# Patient Record
Sex: Male | Born: 1970 | Race: Asian | Hispanic: No | Marital: Married | Smoking: Current every day smoker
Health system: Southern US, Community
[De-identification: ages and names within clinical notes are randomized; demographics above are authoritative.]

## PROBLEM LIST (undated history)

## (undated) DIAGNOSIS — K861 Other chronic pancreatitis: Secondary | ICD-10-CM

## (undated) DIAGNOSIS — I10 Essential (primary) hypertension: Secondary | ICD-10-CM

## (undated) DIAGNOSIS — M549 Dorsalgia, unspecified: Secondary | ICD-10-CM

## (undated) DIAGNOSIS — G8929 Other chronic pain: Secondary | ICD-10-CM

## (undated) DIAGNOSIS — D649 Anemia, unspecified: Secondary | ICD-10-CM

## (undated) DIAGNOSIS — D696 Thrombocytopenia, unspecified: Secondary | ICD-10-CM

## (undated) DIAGNOSIS — K750 Abscess of liver: Secondary | ICD-10-CM

## (undated) DIAGNOSIS — I81 Portal vein thrombosis: Secondary | ICD-10-CM

## (undated) DIAGNOSIS — K760 Fatty (change of) liver, not elsewhere classified: Secondary | ICD-10-CM

## (undated) DIAGNOSIS — K8309 Other cholangitis: Secondary | ICD-10-CM

## (undated) DIAGNOSIS — R109 Unspecified abdominal pain: Secondary | ICD-10-CM

## (undated) DIAGNOSIS — J189 Pneumonia, unspecified organism: Secondary | ICD-10-CM

## (undated) HISTORY — PX: APPENDECTOMY: SHX54

---

## 2005-03-25 DIAGNOSIS — K861 Other chronic pancreatitis: Secondary | ICD-10-CM

## 2005-03-25 HISTORY — PX: OTHER SURGICAL HISTORY: SHX169

## 2005-03-25 HISTORY — DX: Other chronic pancreatitis: K86.1

## 2005-04-09 ENCOUNTER — Inpatient Hospital Stay (HOSPITAL_COMMUNITY): Admission: EM | Admit: 2005-04-09 | Discharge: 2005-04-11 | Payer: Self-pay | Admitting: Emergency Medicine

## 2005-04-09 ENCOUNTER — Ambulatory Visit: Payer: Self-pay | Admitting: Sports Medicine

## 2005-04-11 ENCOUNTER — Ambulatory Visit: Payer: Self-pay | Admitting: Gastroenterology

## 2005-04-22 ENCOUNTER — Ambulatory Visit (HOSPITAL_COMMUNITY): Admission: RE | Admit: 2005-04-22 | Discharge: 2005-04-22 | Payer: Self-pay | Admitting: Gastroenterology

## 2005-05-18 ENCOUNTER — Emergency Department (HOSPITAL_COMMUNITY): Admission: EM | Admit: 2005-05-18 | Discharge: 2005-05-18 | Payer: Self-pay | Admitting: Emergency Medicine

## 2005-07-26 ENCOUNTER — Encounter: Admission: RE | Admit: 2005-07-26 | Discharge: 2005-07-26 | Payer: Self-pay | Admitting: Occupational Medicine

## 2008-11-24 ENCOUNTER — Emergency Department (HOSPITAL_COMMUNITY): Admission: EM | Admit: 2008-11-24 | Discharge: 2008-11-24 | Payer: Self-pay | Admitting: Emergency Medicine

## 2008-12-05 ENCOUNTER — Emergency Department (HOSPITAL_COMMUNITY): Admission: EM | Admit: 2008-12-05 | Discharge: 2008-12-05 | Payer: Self-pay | Admitting: Emergency Medicine

## 2010-08-10 NOTE — Discharge Summary (Signed)
NAMEMARKEIS, Lawrence Brock NO.:  [id number]   MEDICAL RECORD NO.:  [id number]          PATIENT TYPE:  INP   LOCATION:  4703                         FACILITY:  [facility]   PHYSICIAN:  Alanson Puls, M.D.    DATE OF BIRTH:  05-23-1970   DATE OF ADMISSION:  04/08/2005  DATE OF DISCHARGE:                                 DISCHARGE SUMMARY   ATTENDING PHYSICIAN AT DISCHARGE:  Melina Fiddler, MD   ADMISSION DIAGNOSES:  Epigastric abdominal pain.   DISCHARGE DIAGNOSES:  Acute pancreatitis.   CONSULTATIONS:  Gastroenterology on April 10, 2004.   PROCEDURES:  1.  Abdominal ultrasound on April 09, 2005, that showed a small      gallbladder with a polyp and a dilated pancreatic duct of 7.5 mm.  No      biliary dilatation.  2.  CT of the abdomen on April 08, 2005, that revealed a dilated      pancreatic duct with a 6 mm stone projecting into the pancreatic duct      near the ampulla suggesting obstruction caused by gallstone. No evidence      of biliary ductal dilatation or cholelithiasis.  Findings may be      secondary to acute on chronic pancreatitis.  No pancreatic mass.  3.  On April 11, 2005, MRCP consistent with evidence of chronic      pancreatitis with dilatation of the main pancreatic duct and multiple      __________branches, calcification near the ampulla recently seen on CT      has past.  This is likely obstructing the pancreatic duct.  No evidence      of choledocholithiasis, biliary dilatation or gallstones.  Acute      pancreatitis.  Pancreatic fluid not seen.  Pancreatic duct stricture      cannot be excluded.   HOSPITAL COURSE:  Lawrence Brock is a 40 year old male with no significant  past medical history but did admit to alcohol use, drinking 4-5 beers a  couple of days prior to the onset of his epigastric abdominal pain.  The  patient states that the pain was a 10/10 and radiated to his back, not  relieved by aspirin or baking soda or  Alka-seltzer.  States that he had a  similar episode two months ago in which made him vomit.  This pain is not  related to food intake.  The pain became so severe that the patient was seen  at [facility] and then referred to [facility] for further  evaluation.  Past medical history is significant only for appendectomy.  Upon presentation to the emergency department, the patient's white blood  cell count was 8.6 with hemoglobin of 15 and platelets of 163, 71% segs.  Comprehensive metabolic panel was obtained that was within normal limits.  Total bilirubin of 1.1, alk-phos 39, AST 18, ALT 21.  The patient's amylase  was 923 and lipase of 988.  His lipid profile had a triglyceride of 75.  Serum LDH was 81.  Prior to discharge, the patient was tolerating a clear  diet  and was wanting to advance his diet as tolerated.  His pain was well  controlled.  It was not requiring any IV or p.o. pain medications.  Had no  episodes of emesis or loose stools.  His amylase and lipase at discharge  were 110 and 92, respectively.   CONDITION ON DISCHARGE:  Stable.   DISPOSITION:  The patient will be discharged to home.  Return to the  emergency department or [facility] for vomiting, fever, increased  abdominal pain, changes in stool.  The patient was instructed to tolerate a  clear diet and then advance as tolerated.  Was instructed to also avoid  alcohol use.   FOLLOWUP:  He is to follow up at [facility] for an ERCP on January  29 at 9 a.m. and was given a number [phone].  Prior to admission on January  29, the patient is to have nothing to eat or drink after midnight for this  test.  The patient wanted to follow up with Dr. Evonnie Pat at [facility], and was given the number [phone] to schedule for an appointment.  We will attempt to do this at this time.      Alanson Puls, M.D.     MR/MEDQ  D:  04/11/2005  T:  04/11/2005  Job:  [id number]

## 2010-08-10 NOTE — H&P (Signed)
[name], Brock               ACCOUNT NO.:  [id number]   MEDICAL RECORD NO.:  [id number]          PATIENT TYPE:  INP   LOCATION:  4703                         FACILITY:  [facility]   PHYSICIAN:  Leighton Roach McDiarmid, M.D.DATE OF BIRTH:  19-Aug-1970   DATE OF ADMISSION:  04/08/2005  DATE OF DISCHARGE:                                HISTORY & PHYSICAL   CHIEF COMPLAINT:  Epigastric abdominal pain.   HISTORY OF PRESENT ILLNESS:  [name] is a 40 year old Filipino male  with a three day history of epigastric pain, sharp, 10/10 radiating to legs  and right side of the abdomen, all the way to his back.  The patient has  been taking aspirin, baking soda, other sources without any relief.  Two  months ago he had similar episode and it was just relieved by  itself.  He  has been having self induced vomit, hoping he would get some relief.  The  pain has not changed with food intake with him forcing himself to eat.  Per  patient history he was watching the [employer] game on Saturday.  He drank 4 cans  of beer and on Sunday morning he woke up in the middle of the night with  sharp epigastric pain.  Since he could not get any relief with the pain, he  went to [facility] today and he was referred from there to the [facility] ED.   REVIEW OF SYSTEMS:  GI:  Last solid movement January 14, normal.  No nausea,  vomiting, GERD.  Prior to this episode there is no history of present  trauma.  No sick contact.  No history of hepatitis or gallbladder disease,  no dyspepsia.   PAST MEDICAL HISTORY:  Apparently good health  history, but patient has not  had a check up in a long time secondary to a lack of insurance.   FAMILY HISTORY:  Good health.   MEDICATIONS:  Multivitamins.   ALLERGIES:  None.   SOCIAL HISTORY:  The patient is living with wife, has 3 daughters living  with him.  The patient smokes 1 pack per day for several years.  Drinks once  a week, 4 cans of beer.  Denies any illicit  drugs.   PAST SURGICAL HISTORY:  Appendectomy at 40 years old.   LABORATORIES:  Amylase 923, lipase 988, CMP shows calcium 8.9, sodium 141,  potassium 3.8, chloride 109, CO2 27, BUN 13, creatinine 0.8.  Glucose 93,  AST 18, ALT 21.  Alkaline phosphatase 35.  Bilirubin total 1.1.  Total  protein 6.1.  Albumin 3.5.  CBC shows white blood cells 8.6, Hemoglobin  15.3, hematocrit 44.5, platelets 153.  Acute abdominal series shows chest  with no acute disease.  Chronic appearing lung change, not acute.  Abdominal  process non obstructive bowel gas pattern.   MEDICATIONS:  Medications received in the ED:  1.  Dilaudid 1 mg.  2.  Zofran 4 mg IV.  3.  Protonix 40 mg IV.  4.  IV fluids 500 cc normal saline bolus.   The patient was complaining of  this at [place].   VITAL SIGNS:  Temperature 98.3, heart rate 62, blood pressure 110/67,  respiratory rate 20, O2 98% on room air.   PHYSICAL EXAMINATION:  GENERAL:  No acute distress.  Alert and oriented x 3.  HEENT:  Oropharynx was within normal limits.  Pupils were equal, reactive to  light.  Intact extraocular movements. Pink tiny membranes erythematous but  sclerae anicteric.  NECK: Supple, no adenopathy. Thyroid not palpable.  CHEST:  Cardiovascular regular rate and rhythm, no murmurs, rubs or gallops.  Capillary refill is in the seconds. Pulses palpable symmetrically.  RESPIRATORY:  Good respiratory effort by the patient bilaterally.  No  wheezes, rhonchi or rales.  ABDOMEN:  Positive bowel sounds.  Nondistended.  Mild tenderness on the left  upper quadrant.  The patient is status post Dilaudid administration.  RECTAL:  Exam performed at [facility] normal with negative fecal  occult test.  GENITOURINARY:  Deferred.  EXTREMITIES:  No edema. No cyanosis.  Peripheral pulses palpable.  SKIN: Normal color, no lesions.  NEUROLOGICAL:  Cranial nerves II-XII normal.  Remarkable strength 5/5, deep  tendon reflexes 2+.  Normal gait.  Romberg  negative.  Finger to nose normal.   ASSESSMENT:  A 40 year old Filipino male with prior good health admitted  with acute pancreatitis.   PLAN:  1.  Abdominal pain.  Acute pancreatitis first episode with apparently      previously good health history.  Will admit to control pain, keep NPO.      Patient no acute abdomen with KUB normal.  __________ regards to alcohol      since patient drinks 4 cans of beer on the weekends, but no heavy      alcohol intake, but chronic consumption.  Will rule out gallbladder      disease or cholelithiasis with an abdominal ultrasound, but patient has      no history of dyspepsia and this is his first episode and may be similar      to a symptomatic gallbladder sign.  2.  Hypertriglyceridemia:  Will check a lipid profile in the morning.  I      think this is more of an episode of pancreatitis but since pain      persisted and had epigastric pain, it could have been also a bout of      gastritis or peptic ulcer disease.  Will get first CT scan of abdomen      with and out contrast now since patient is in mild to moderate pain and      this is a non complicated pancreatitis per clinical laboratory data.  If      patient does not respond to therapy and persistent pain may indicate      __________ CT scan.  We will control pain with Dilaudid 1-2 mg IV q.4      hours p.r.n.  3.  GI prophylaxis.  Protonix 40 mg IV b.i.d.  4.  __________.  Will keep NPO.  Give IV fluids normal saline plus 30 mEq of      KCL at 135 cc per hour.  5.  Social work to address financial situation.      Adrian Blackwater, MD    ______________________________  Leighton Roach McDiarmid, M.D.    IM/MEDQ  D:  04/09/2005  T:  04/09/2005  Job:  [id number]

## 2011-07-19 ENCOUNTER — Encounter (HOSPITAL_BASED_OUTPATIENT_CLINIC_OR_DEPARTMENT_OTHER): Payer: Self-pay | Admitting: Emergency Medicine

## 2011-07-19 ENCOUNTER — Emergency Department (HOSPITAL_BASED_OUTPATIENT_CLINIC_OR_DEPARTMENT_OTHER)
Admission: EM | Admit: 2011-07-19 | Discharge: 2011-07-19 | Disposition: A | Discharge status: Home / Self Care | Payer: Self-pay | Attending: Emergency Medicine | Admitting: Emergency Medicine

## 2011-07-19 ENCOUNTER — Emergency Department (INDEPENDENT_AMBULATORY_CARE_PROVIDER_SITE_OTHER): Payer: Self-pay

## 2011-07-19 DIAGNOSIS — R1033 Periumbilical pain: Secondary | ICD-10-CM | POA: Insufficient documentation

## 2011-07-19 DIAGNOSIS — K859 Acute pancreatitis without necrosis or infection, unspecified: Secondary | ICD-10-CM | POA: Insufficient documentation

## 2011-07-19 DIAGNOSIS — R1011 Right upper quadrant pain: Secondary | ICD-10-CM | POA: Insufficient documentation

## 2011-07-19 DIAGNOSIS — Z9089 Acquired absence of other organs: Secondary | ICD-10-CM

## 2011-07-19 DIAGNOSIS — K8689 Other specified diseases of pancreas: Secondary | ICD-10-CM

## 2011-07-19 DIAGNOSIS — R109 Unspecified abdominal pain: Secondary | ICD-10-CM

## 2011-07-19 DIAGNOSIS — F17200 Nicotine dependence, unspecified, uncomplicated: Secondary | ICD-10-CM | POA: Insufficient documentation

## 2011-07-19 LAB — DIFFERENTIAL
Eosinophils Absolute: 0.1 10*3/uL (ref 0.0–0.7)
Lymphs Abs: 1.6 10*3/uL (ref 0.7–4.0)
Neutro Abs: 8.6 10*3/uL — ABNORMAL HIGH (ref 1.7–7.7)

## 2011-07-19 LAB — COMPREHENSIVE METABOLIC PANEL
ALT: 17 U/L (ref 0–53)
BUN: 21 mg/dL (ref 6–23)
CO2: 26 mEq/L (ref 19–32)
Calcium: 9.6 mg/dL (ref 8.4–10.5)
Creatinine, Ser: 1 mg/dL (ref 0.50–1.35)
GFR calc Af Amer: >90 mL/min (ref >90)
Glucose, Bld: 106 mg/dL — ABNORMAL HIGH (ref 70–99)
Total Bilirubin: 0.5 mg/dL (ref 0.3–1.2)
Total Protein: 7.1 g/dL (ref 6.0–8.3)

## 2011-07-19 LAB — CBC
Hemoglobin: 14.9 g/dL (ref 13.0–17.0)
MCHC: 35.1 g/dL (ref 30.0–36.0)
Platelets: 158 10*3/uL (ref 150–400)
RBC: 4.75 MIL/uL (ref 4.22–5.81)
RDW: 11.8 % (ref 11.5–15.5)
WBC: 11.1 10*3/uL — ABNORMAL HIGH (ref 4.0–10.5)

## 2011-07-19 LAB — URINALYSIS, ROUTINE W REFLEX MICROSCOPIC
Glucose, UA: NEGATIVE mg/dL
Hgb urine dipstick: NEGATIVE
Ketones, ur: NEGATIVE mg/dL
Protein, ur: NEGATIVE mg/dL
Specific Gravity, Urine: 1.023 (ref 1.005–1.030)
pH: 6 (ref 5.0–8.0)

## 2011-07-19 MED ORDER — MORPHINE SULFATE 4 MG/ML IJ SOLN
4.0000 mg | Freq: Once | INTRAMUSCULAR | Status: AC
  Administered 2011-07-19: 4 mg via INTRAVENOUS
  Filled 2011-07-19: qty 1

## 2011-07-19 MED ORDER — OXYCODONE-ACETAMINOPHEN 5-325 MG PO TABS: 2.0000 | ORAL_TABLET | Freq: Four times a day (QID) | ORAL | Status: AC | PRN

## 2011-07-19 MED ORDER — SODIUM CHLORIDE 0.9 % IV SOLN
1000.0000 mL | Freq: Once | INTRAVENOUS | Status: AC
  Administered 2011-07-19: 1000 mL via INTRAVENOUS

## 2011-07-19 MED ORDER — SODIUM CHLORIDE 0.9 % IV SOLN: 1000.0000 mL | INTRAVENOUS | Status: DC

## 2011-07-19 MED ORDER — ONDANSETRON 8 MG PO TBDP: 8.0000 mg | ORAL_TABLET | Freq: Three times a day (TID) | ORAL | Status: AC | PRN

## 2011-07-19 NOTE — ED Notes (Signed)
Mid abdominal pain without n/v/d.  Pain started yesterday.

## 2011-07-19 NOTE — ED Provider Notes (Signed)
History     CSN: [id number]  Arrival date & time 07/19/11  1529   First MD Initiated Contact with Patient 07/19/11 1536      Chief Complaint  Patient presents with  . Abdominal Pain    (Consider location/radiation/quality/duration/timing/severity/associated sxs/prior treatment) HPI Patient is a 41 year old male who presents today complaining of abdominal pain that is a 9/10 and located in the right upper quadrant and periumbilical area. This began early this morning. This worsened throughout the day at work. Patient has not had nausea, vomiting, or fevers. His last bowel movement was 2 hours ago and was normal for him. The patient has history of an appendectomy but no other abdominal surgeries. Nothing has made the patient's pain better and movement makes it worse. He has not taken any medication for this. He does have 4 children and some of them have had gastrointestinal illness recently. Patient also has a history of pancreatitis and says this does feel somewhat similar. There are no other associated or modifying factors. Past Medical History  Diagnosis Date  . Pancreatitis     History reviewed. No pertinent past surgical history.  History reviewed. No pertinent family history.  History  Substance Use Topics  . Smoking status: Current Everyday Smoker -- 0.5 packs/day  . Smokeless tobacco: Not on file  . Alcohol Use: Yes      Review of Systems  Constitutional: Negative.   HENT: Negative.   Eyes: Negative.   Respiratory: Negative.   Cardiovascular: Negative.   Gastrointestinal: Positive for abdominal pain.  Genitourinary: Negative.   Musculoskeletal: Negative.   Skin: Negative.   Neurological: Negative.   Hematological: Negative.   Psychiatric/Behavioral: Negative.   All other systems reviewed and are negative.    Allergies  Review of patient's allergies indicates no known allergies.  Home Medications  No current outpatient prescriptions on file.  BP 141/105   Pulse 74  Temp(Src) 98 F (36.7 C) (Oral)  Resp 16  Ht 5\' 8"  (1.727 m)  Wt 160 lb (72.576 kg)  BMI 24.33 kg/m2  SpO2 100%  Physical Exam  Nursing note and vitals reviewed. GEN: Well-developed, well-nourished male in no distress HEENT: Atraumatic, normocephalic. Oropharynx clear without erythema EYES: PERRLA BL, no scleral icterus. NECK: Trachea midline, no meningismus CV: regular rate and rhythm. No murmurs, rubs, or gallops PULM: No respiratory distress.  No crackles, wheezes, or rales. GI: soft, tenderness to palpation most notable over the period umbilical and right upper quadrant areas. No guarding, rebound. + bowel sounds  GU: deferred Neuro: cranial nerves 2-12 intact, no abnormalities of strength or sensation, A and O x 3 MSK: Patient moves all 4 extremities symmetrically, no deformity, edema, or injury noted Skin: No rashes petechiae, purpura, or jaundice Psych: no abnormality of mood   ED Course  Procedures (including critical care time)  Labs Reviewed  CBC - Abnormal; Notable for the following:    WBC 11.1 (*)    All other components within normal limits  DIFFERENTIAL - Abnormal; Notable for the following:    Neutro Abs 8.6 (*)    All other components within normal limits  COMPREHENSIVE METABOLIC PANEL - Abnormal; Notable for the following:    Glucose, Bld 106 (*)    All other components within normal limits  LIPASE, BLOOD - Abnormal; Notable for the following:    Lipase 67 (*)    All other components within normal limits  URINALYSIS, ROUTINE W REFLEX MICROSCOPIC   US Abdomen Complete  07/19/2011  *RADIOLOGY  REPORT*  Clinical Data:  41 year old male with abdominal pain.  History of pancreatitis and prior appendectomy.  COMPLETE ABDOMINAL ULTRASOUND  Comparison:  CT abdomen 04/09/2005.  Findings:  Gallbladder:  Contracted.  No cholelithiasis or sludge evident. Wall thickness remains normal up to 2 mm.  No sonographic Murphy's sign elicited.  Common bile duct:   Normal measuring 3 mm diameter.  Liver:  Echogenicity within normal limits.  No focal liver lesion. No intrahepatic ductal dilatation.  IVC:  Incompletely visualized due to overlying bowel gas, visualized portions within normal limits.  Pancreas:  Bowel gas obscures some portions of the pancreas. Echogenicity of the pancreatic head and visualized body is within normal limits, however the main pancreatic duct is dilated up to 6 mm diameter (5-6 mm on the comparison 2007 CT).  No free fluid.  Spleen:  Normal measuring 6.5 cm in length.  Right Kidney:  Normal measuring 10.6 cm in length.  Left Kidney:  Large but simple appearing lower pole cyst measuring 4 cm diameter.  No hydronephrosis and otherwise normal measuring 1.8 cm.  Abdominal aorta:  Incompletely visualized due to overlying bowel gas, visualized portions within normal limits.  IMPRESSION: 1.  No definite acute abdominal process; chronic dilatation of the main pancreatic duct,correlation with amylase and lipase would be more sensitive than ultrasound for detection of pancreatitis. 2.  Contracted gallbladder.  No cholelithiasis or evidence of acute cholecystitis.  No CBD or intrahepatic biliary dilatation.  Original Report Authenticated By: Harley Hallmark, M.D.     1. Pancreatitis       MDM  Patient was evaluated for his abdominal pain and treated with morphine and IV fluids. Labs showed a slight elevation of lipase. Patient was feeling much better after pain medications. Right upper quadrant ultrasound was performed to confirm no signs of gallbladder disease. This was negative. Patient was discharged home with a prescription for Percocet as well as Zofran. He is unable to tolerate oral intake he is welcome to return. Patient was discharged in good condition and comfortable with plan for discharge.        Cyndra Numbers, MD 07/19/11 [phone]

## 2011-07-19 NOTE — Discharge Instructions (Signed)
Acute Pancreatitis The pancreas is a large gland located behind your stomach. It produces (secretes) enzymes. These enzymes help digest food. It also releases the hormones glucagon and insulin. These hormones help regulate blood sugar. When the pancreas becomes inflamed, the disease is called pancreatitis. Inflammation of the pancreas occurs when enzymes from the pancreas begin attacking and digesting the pancreas. CAUSES  Most cases ofsudden onset (acute) pancreatitis are caused by:  Alcohol abuse.   Gallstones.  Other less common causes are:  Some medications.   Exposure to certain chemicals   Infection.   Damage caused by an accident (trauma).   Surgery of the belly (abdomen).  SYMPTOMS  Acute pancreatitis usually begins with pain in the upper abdomen and may radiate to the back. This pain may last a couple days. The constant pain varies from mild to severe. The acute form of this disease may vary from mild, nonspecific abdominal pain to profound shock with coma. About 1 in 5 cases are severe. These patients become dehydrated and develop low blood pressure. In severe cases, bleeding into the pancreas can lead to shock and death. The lungs, heart, and kidneys may fail. DIAGNOSIS  Your caregiver will form a clinical opinion after giving you an exam. Laboratory work is used to confirm this diagnosis. Often,a digestive enzyme from the pancreas (serum amylase) and other enzymes are elevated. Sugars and fats (lipids) in the blood may be elevated. There may also be changes in the following levels: calcium, magnesium, potassium, chloride and bicarbonate (chemicals in the blood). X-rays, a CT scan, or ultrasound of your abdomen may be necessary to search for other causes of your abdominal pain. TREATMENT  Most pancreatitis requires treatment of symptoms. Most acute attacks last a couple of days. Your caregiver can discuss the treatment options with you.  If complications occur, hospitalization  may be necessary for pain control and intravenous (IV) fluid replacement.   Sometimes, a tube may be put into the stomach to control vomiting.   Food may not be allowed for 3 to 4 days. This gives the pancreas time to rest. Giving the pancreas a rest means there is no stimulation that would produce more enzymes and cause more damage.   Medicines (antibiotics) that kill germs may be given if infection is the cause.   Sometimes, surgery may be required.   Following an acute attack, your caregiver will determine the cause, if possible, and offer suggestions to prevent recurrences.  HOME CARE INSTRUCTIONS   Eat smaller, more frequent meals. This reduces the amount of digestive juices the pancreas produces.   Decrease the amount of fat in your diet. This may help reduce loose, diarrheal stools.   Drink enough water and fluids to keep your urine clear or pale yellow. This is to avoid dehydration which can cause increased pain.   Talk to your caregiver about pain relievers or other medicines that may help.   Avoid anything that may have triggered your pancreatitis (for example, alcohol).   Follow the diet advised by your caregiver. Do not advance the diet too soon.   Take medicines as prescribed.   Get plenty of rest.   Check your blood sugar at home as directed by your caregiver.   If your caregiver has given you a follow-up appointment, it is very important to keep that appointment. Not keeping the appointment could result in a lasting (chronic) or permanent injury, pain, and disability. If there is any problem keeping the appointment, you must call to reschedule.    SEEK MEDICAL CARE IF:   You are not recovering in the time described by your caregiver.   You have persistent pain, weakness, or feel sick to your stomach (nauseous).   You have recovered and then have another bout of pain.  SEEK IMMEDIATE MEDICAL CARE IF:   You are unable to eat or keep fluids down.   Your pain  increases a lot or changes.   You have an oral temperature above 102 F (38.9 C), not controlled by medicine.   Your skin or the white part of your eyes look yellow (jaundice).   You develop vomiting.   You feel dizzy or faint.   Your blood sugar is high (over 300).  MAKE SURE YOU:   Understand these instructions.   Will watch your condition.   Will get help right away if you are not doing well or get worse.  Document Released: 03/11/2005 Document Revised: 02/28/2011 Document Reviewed: 10/23/2007 [employer] Patient Information 2012 [employer]. 

## 2013-03-25 DIAGNOSIS — D696 Thrombocytopenia, unspecified: Secondary | ICD-10-CM

## 2013-03-25 HISTORY — DX: Thrombocytopenia, unspecified: D69.6

## 2013-04-07 ENCOUNTER — Telehealth: Payer: Self-pay | Admitting: Gastroenterology

## 2013-04-08 ENCOUNTER — Encounter: Payer: Self-pay | Admitting: Gastroenterology

## 2013-04-08 NOTE — Telephone Encounter (Signed)
Left message with family member to have pt return my call, pt has not been seen since 2007.

## 2013-05-05 ENCOUNTER — Ambulatory Visit: Payer: Self-pay | Admitting: Gastroenterology

## 2013-07-22 ENCOUNTER — Emergency Department (HOSPITAL_BASED_OUTPATIENT_CLINIC_OR_DEPARTMENT_OTHER)
Admission: EM | Admit: 2013-07-22 | Discharge: 2013-07-22 | Disposition: A | Discharge status: Home / Self Care | Payer: Managed Care, Other (non HMO) | Attending: Emergency Medicine | Admitting: Emergency Medicine

## 2013-07-22 ENCOUNTER — Encounter (HOSPITAL_BASED_OUTPATIENT_CLINIC_OR_DEPARTMENT_OTHER): Payer: Self-pay | Admitting: Emergency Medicine

## 2013-07-22 DIAGNOSIS — Z8719 Personal history of other diseases of the digestive system: Secondary | ICD-10-CM | POA: Insufficient documentation

## 2013-07-22 DIAGNOSIS — G8929 Other chronic pain: Secondary | ICD-10-CM | POA: Insufficient documentation

## 2013-07-22 DIAGNOSIS — F17200 Nicotine dependence, unspecified, uncomplicated: Secondary | ICD-10-CM | POA: Insufficient documentation

## 2013-07-22 DIAGNOSIS — M5380 Other specified dorsopathies, site unspecified: Secondary | ICD-10-CM | POA: Insufficient documentation

## 2013-07-22 DIAGNOSIS — M62830 Muscle spasm of back: Secondary | ICD-10-CM

## 2013-07-22 HISTORY — DX: Dorsalgia, unspecified: M54.9

## 2013-07-22 HISTORY — DX: Other chronic pain: G89.29

## 2013-07-22 LAB — URINALYSIS, ROUTINE W REFLEX MICROSCOPIC
Bilirubin Urine: NEGATIVE
Glucose, UA: NEGATIVE mg/dL
Hgb urine dipstick: NEGATIVE
KETONES UR: NEGATIVE mg/dL
Leukocytes, UA: NEGATIVE
Nitrite: NEGATIVE
PROTEIN: NEGATIVE mg/dL
SPECIFIC GRAVITY, URINE: 1.027 (ref 1.005–1.030)
UROBILINOGEN UA: 1 mg/dL (ref 0.0–1.0)
pH: 6 (ref 5.0–8.0)

## 2013-07-22 MED ORDER — OXYCODONE-ACETAMINOPHEN 5-325 MG PO TABS: 1.0000 | ORAL_TABLET | ORAL | Status: DC | PRN

## 2013-07-22 MED ORDER — DIAZEPAM 5 MG PO TABS
5.0000 mg | ORAL_TABLET | Freq: Once | ORAL | Status: AC
  Administered 2013-07-22: 5 mg via ORAL
  Filled 2013-07-22: qty 1

## 2013-07-22 MED ORDER — OXYCODONE-ACETAMINOPHEN 5-325 MG PO TABS
2.0000 | ORAL_TABLET | Freq: Once | ORAL | Status: AC
  Administered 2013-07-22: 2 via ORAL
  Filled 2013-07-22: qty 2

## 2013-07-22 MED ORDER — ONDANSETRON HCL 4 MG PO TABS: 4.0000 mg | ORAL_TABLET | Freq: Four times a day (QID) | ORAL | Status: DC

## 2013-07-22 MED ORDER — IBUPROFEN 800 MG PO TABS: 800.0000 mg | ORAL_TABLET | Freq: Three times a day (TID) | ORAL | Status: DC | PRN

## 2013-07-22 MED ORDER — IBUPROFEN 800 MG PO TABS
800.0000 mg | ORAL_TABLET | Freq: Once | ORAL | Status: AC
  Administered 2013-07-22: 800 mg via ORAL
  Filled 2013-07-22: qty 1

## 2013-07-22 MED ORDER — DIAZEPAM 5 MG PO TABS: 5.0000 mg | ORAL_TABLET | Freq: Three times a day (TID) | ORAL | Status: DC | PRN

## 2013-07-22 MED ORDER — ONDANSETRON 4 MG PO TBDP
4.0000 mg | ORAL_TABLET | Freq: Once | ORAL | Status: AC
  Administered 2013-07-22: 4 mg via ORAL
  Filled 2013-07-22: qty 1

## 2013-07-22 NOTE — ED Notes (Signed)
Pt to room 1 in w/c, reports back pain x Sunday when he helped a friend move. Pt states he reached over to turn on the shower "and my back locked up..." pt able to stand and pivot to bed with max assist, states he feels "like spasm" in his low back.

## 2013-07-22 NOTE — ED Notes (Signed)
Pt given po fluids per request. Resting quietly in nad.

## 2013-07-22 NOTE — Discharge Instructions (Signed)
Back Exercises Back exercises help treat and prevent back injuries. The goal of back exercises is to increase the strength of your abdominal and back muscles and the flexibility of your back. These exercises should be started when you no longer have back pain. Back exercises include:  Pelvic Tilt. Lie on your back with your knees bent. Tilt your pelvis until the lower part of your back is against the floor. Hold this position 5 to 10 sec and repeat 5 to 10 times.  Knee to Chest. Pull first 1 knee up against your chest and hold for 20 to 30 seconds, repeat this with the other knee, and then both knees. This may be done with the other leg straight or bent, whichever feels better.  Sit-Ups or Curl-Ups. Bend your knees 90 degrees. Start with tilting your pelvis, and do a partial, slow sit-up, lifting your trunk only 30 to 45 degrees off the floor. Take at least 2 to 3 seconds for each sit-up. Do not do sit-ups with your knees out straight. If partial sit-ups are difficult, simply do the above but with only tightening your abdominal muscles and holding it as directed.  Hip-Lift. Lie on your back with your knees flexed 90 degrees. Push down with your feet and shoulders as you raise your hips a couple inches off the floor; hold for 10 seconds, repeat 5 to 10 times.  Back arches. Lie on your stomach, propping yourself up on bent elbows. Slowly press on your hands, causing an arch in your low back. Repeat 3 to 5 times. Any initial stiffness and discomfort should lessen with repetition over time.  Shoulder-Lifts. Lie face down with arms beside your body. Keep hips and torso pressed to floor as you slowly lift your head and shoulders off the floor. Do not overdo your exercises, especially in the beginning. Exercises may cause you some mild back discomfort which lasts for a few minutes; however, if the pain is more severe, or lasts for more than 15 minutes, do not continue exercises until you see your caregiver.  Improvement with exercise therapy for back problems is slow.  See your caregivers for assistance with developing a proper back exercise program. Document Released: 04/18/2004 Document Revised: 06/03/2011 Document Reviewed: 01/10/2011 [facility] Patient Information 2014 [place]. Lumbosacral Strain Lumbosacral strain is a strain of any of the parts that make up your lumbosacral vertebrae. Your lumbosacral vertebrae are the bones that make up the lower third of your backbone. Your lumbosacral vertebrae are held together by muscles and tough, fibrous tissue (ligaments).  CAUSES  A sudden blow to your back can cause lumbosacral strain. Also, anything that causes an excessive stretch of the muscles in the low back can cause this strain. This is typically seen when people exert themselves strenuously, fall, lift heavy objects, bend, or crouch repeatedly. RISK FACTORS  Physically demanding work.  Participation in pushing or pulling sports or sports that require sudden twist of the back (tennis, golf, baseball).  Weight lifting.  Excessive lower back curvature.  Forward-tilted pelvis.  Weak back or abdominal muscles or both.  Tight hamstrings. SIGNS AND SYMPTOMS  Lumbosacral strain may cause pain in the area of your injury or pain that moves (radiates) down your leg.  DIAGNOSIS Your health care provider can often diagnose lumbosacral strain through a physical exam. In some cases, you may need tests such as X-ray exams.  TREATMENT  Treatment for your lower back injury depends on many factors that your clinician will have to  evaluate. However, most treatment will include the use of anti-inflammatory medicines. HOME CARE INSTRUCTIONS   Avoid hard physical activities (tennis, racquetball, waterskiing) if you are not in proper physical condition for it. This may aggravate or create problems.  If you have a back problem, avoid sports requiring sudden body movements. Swimming and walking are  generally safer activities.  Maintain good posture.  Maintain a healthy weight.  For acute conditions, you may put ice on the injured area.  Put ice in a plastic bag.  Place a towel between your skin and the bag.  Leave the ice on for 20 minutes, 2 3 times a day.  When the low back starts healing, stretching and strengthening exercises may be recommended. SEEK MEDICAL CARE IF:  Your back pain is getting worse.  You experience severe back pain not relieved with medicines. SEEK IMMEDIATE MEDICAL CARE IF:   You have numbness, tingling, weakness, or problems with the use of your arms or legs.  There is a change in bowel or bladder control.  You have increasing pain in any area of the body, including your belly (abdomen).  You notice shortness of breath, dizziness, or feel faint.  You feel sick to your stomach (nauseous), are throwing up (vomiting), or become sweaty.  You notice discoloration of your toes or legs, or your feet get very cold. MAKE SURE YOU:   Understand these instructions.  Will watch your condition.  Will get help right away if you are not doing well or get worse. Document Released: 12/19/2004 Document Revised: 12/30/2012 Document Reviewed: 10/28/2012 [facility] Patient Information 2014 [place], [place].

## 2013-07-22 NOTE — ED Notes (Signed)
Patient ambulates without difficulty.    MD at bedside.  Patient preparing for discharge.

## 2013-07-22 NOTE — ED Provider Notes (Addendum)
TIME SEEN: 7:27 AM  CHIEF COMPLAINT: Back spasm  HPI: Patient is a 43 year old male with a history of prior episodes of pancreatitis and muscle spasms in his lower back presents emergency department with back pain, spasms. He reports that he works 2 jobs and frequently lifts very heavy items. He states that several days ago he helped his friend move. Today when he was getting in the shower he felt his back "locked up". He states he felt some tingling in his bilateral toes after this happened but this numbness is completely resolved. He denies any focal weakness. No bowel or bladder incontinence. No urinary retention. No fever.  No other history of injury. He states he's had similar pain in the past and went to [facility] who gave him a shot of pain medication and sent him home with gabapentin and ibuprofen which have helped intermittently with his pain. Patient also reports he has intermittent episodes of pain with urination that he describes as a burning. No penile discharge, scrotal swelling or testicular pain. No hematuria. Patient is married, wife is at bedside.  ROS: See HPI Constitutional: no fever  Eyes: no drainage  ENT: no runny nose   Cardiovascular:  no chest pain  Resp: no SOB  GI: no vomiting GU: no dysuria Integumentary: no rash  Allergy: no hives  Musculoskeletal: no leg swelling  Neurological: no slurred speech ROS otherwise negative  PAST MEDICAL HISTORY/PAST SURGICAL HISTORY:  Past Medical History  Diagnosis Date  . Pancreatitis   . Chronic back pain     MEDICATIONS:  Prior to Admission medications   Medication Sig Start Date End Date Taking? Authorizing Provider  gabapentin (NEURONTIN) 300 MG capsule Take 600 mg by mouth 2 (two) times daily as needed.   Yes Historical Provider, MD  Alum & Mag Hydroxide-Simeth (ANTACID ANTI-GAS PO) Take 1 tablet by mouth daily as needed. Patient used this medication for heartburn.    Historical Provider, MD  Homeopathic Products  (ALLERGY MEDICINE PO) Take 1 tablet by mouth daily as needed. Patient used this medication for allergies.    Historical Provider, MD  ibuprofen (ADVIL,MOTRIN) 200 MG tablet Take 400 mg by mouth every 6 (six) hours as needed. Patient used this medication for his muscle aches.    Historical Provider, MD  Multiple Vitamin (MULTIVITAMIN) tablet Take 1 tablet by mouth daily.    Historical Provider, MD    ALLERGIES:  No Known Allergies  SOCIAL HISTORY:  History  Substance Use Topics  . Smoking status: Current Every Day Smoker -- 0.50 packs/day  . Smokeless tobacco: Not on file  . Alcohol Use: Yes    FAMILY HISTORY: History reviewed. No pertinent family history.  EXAM: BP 134/91  Pulse 58  Temp(Src) 98 F (36.7 C) (Oral)  Resp 18  SpO2 99% CONSTITUTIONAL: Alert and oriented and responds appropriately to questions. Well-appearing; well-nourished HEAD: Normocephalic EYES: Conjunctivae clear, PERRL ENT: normal nose; no rhinorrhea; moist mucous membranes; pharynx without lesions noted NECK: Supple, no meningismus, no LAD  CARD: RRR; S1 and S2 appreciated; no murmurs, no clicks, no rubs, no gallops RESP: Normal chest excursion without splinting or tachypnea; breath sounds clear and equal bilaterally; no wheezes, no rhonchi, no rales,  ABD/GI: Normal bowel sounds; non-distended; soft, non-tender, no rebound, no guarding BACK:  The back appears normal and is diffusely tender to palpation in the lumbar paraspinal musculature with associated spasm, no midline tenderness or step-off or deformity, there is no CVA tenderness EXT: Normal ROM in all  joints; non-tender to palpation; no edema; normal capillary refill; no cyanosis    SKIN: Normal color for age and race; warm NEURO: Moves all extremities equally, strength 5/5 in all 4 extremity, sensation to light touch intact diffusely, 2+ bilateral upper and lower extremity deep tendon reflexes, no clonus PSYCH: The patient's mood and manner are  appropriate. Grooming and personal hygiene are appropriate.  MEDICAL DECISION MAKING: Patient here for muscle spasms. I am not concerned for epidural abscess, discitis, transverse myelitis, spinal stenosis or cauda equina. Do not feel he needs imaging at this time. We'll give ibuprofen, Percocet, Valium, Zofran and reassess. Given his intermittent episodes of dysuria, we'll also obtain a urinalysis although I suspect his back pain is do to musculoskeletal pain. He is not concerned for STD exposure.  ED PROGRESS: 8:18 AM  Pt reports feeling much better. He is able to ambulate without difficulty or assistance. Normal gait. His urinalysis shows no sign of infection or blood. We'll discharge home with prescription for ibuprofen, Percocet and Valium, work note, supportive care instructions and return precautions. Patient and family at bedside verbalize understanding and are comfortable with this plan     [place], DO 07/22/13 [place], DO 07/22/13 [id number]

## 2013-09-03 ENCOUNTER — Encounter (HOSPITAL_COMMUNITY): Payer: Self-pay | Admitting: Emergency Medicine

## 2013-09-03 ENCOUNTER — Emergency Department (HOSPITAL_COMMUNITY): Payer: Managed Care, Other (non HMO)

## 2013-09-03 ENCOUNTER — Emergency Department (HOSPITAL_COMMUNITY)
Admission: EM | Admit: 2013-09-03 | Discharge: 2013-09-04 | Disposition: A | Discharge status: Home / Self Care | Payer: Managed Care, Other (non HMO) | Attending: Emergency Medicine | Admitting: Emergency Medicine

## 2013-09-03 DIAGNOSIS — G8929 Other chronic pain: Secondary | ICD-10-CM | POA: Insufficient documentation

## 2013-09-03 DIAGNOSIS — M545 Low back pain, unspecified: Secondary | ICD-10-CM | POA: Insufficient documentation

## 2013-09-03 DIAGNOSIS — K859 Acute pancreatitis without necrosis or infection, unspecified: Secondary | ICD-10-CM | POA: Insufficient documentation

## 2013-09-03 DIAGNOSIS — Z79899 Other long term (current) drug therapy: Secondary | ICD-10-CM | POA: Insufficient documentation

## 2013-09-03 DIAGNOSIS — F17200 Nicotine dependence, unspecified, uncomplicated: Secondary | ICD-10-CM | POA: Insufficient documentation

## 2013-09-03 LAB — CBC WITH DIFFERENTIAL/PLATELET
Basophils Absolute: 0 10*3/uL (ref 0.0–0.1)
Basophils Relative: 0 % (ref 0–1)
EOS ABS: 0.1 10*3/uL (ref 0.0–0.7)
EOS PCT: 1 % (ref 0–5)
HCT: 43.3 % (ref 39.0–52.0)
Hemoglobin: 14.6 g/dL (ref 13.0–17.0)
Lymphocytes Relative: 12 % (ref 12–46)
Lymphs Abs: 0.9 10*3/uL (ref 0.7–4.0)
MCH: 30.6 pg (ref 26.0–34.0)
MCHC: 33.7 g/dL (ref 30.0–36.0)
MCV: 90.8 fL (ref 78.0–100.0)
Monocytes Absolute: 0.6 10*3/uL (ref 0.1–1.0)
Monocytes Relative: 8 % (ref 3–12)
Neutro Abs: 6.1 10*3/uL (ref 1.7–7.7)
Neutrophils Relative %: 79 % — ABNORMAL HIGH (ref 43–77)
PLATELETS: 169 10*3/uL (ref 150–400)
RBC: 4.77 MIL/uL (ref 4.22–5.81)
RDW: 11.9 % (ref 11.5–15.5)
WBC: 7.8 10*3/uL (ref 4.0–10.5)

## 2013-09-03 LAB — COMPREHENSIVE METABOLIC PANEL
ALT: 21 U/L (ref 0–53)
AST: 17 U/L (ref 0–37)
Albumin: 3.8 g/dL (ref 3.5–5.2)
Alkaline Phosphatase: 76 U/L (ref 39–117)
BUN: 23 mg/dL (ref 6–23)
CALCIUM: 9.7 mg/dL (ref 8.4–10.5)
CO2: 24 mEq/L (ref 19–32)
Chloride: 100 mEq/L (ref 96–112)
Creatinine, Ser: 0.97 mg/dL (ref 0.50–1.35)
GFR calc Af Amer: >90 mL/min (ref >90)
GFR calc non Af Amer: >90 mL/min (ref >90)
Glucose, Bld: 116 mg/dL — ABNORMAL HIGH (ref 70–99)
Potassium: 4 mEq/L (ref 3.7–5.3)
SODIUM: 138 meq/L (ref 137–147)
TOTAL PROTEIN: 7.9 g/dL (ref 6.0–8.3)
Total Bilirubin: 0.3 mg/dL (ref 0.3–1.2)

## 2013-09-03 LAB — URINALYSIS, ROUTINE W REFLEX MICROSCOPIC
Bilirubin Urine: NEGATIVE
Glucose, UA: NEGATIVE mg/dL
Hgb urine dipstick: NEGATIVE
Ketones, ur: NEGATIVE mg/dL
Leukocytes, UA: NEGATIVE
NITRITE: NEGATIVE
Protein, ur: NEGATIVE mg/dL
SPECIFIC GRAVITY, URINE: 1.028 (ref 1.005–1.030)
Urobilinogen, UA: 1 mg/dL (ref 0.0–1.0)
pH: 5 (ref 5.0–8.0)

## 2013-09-03 LAB — LIPASE, BLOOD: LIPASE: 92 U/L — AB (ref 11–59)

## 2013-09-03 MED ORDER — MORPHINE SULFATE 4 MG/ML IJ SOLN
4.0000 mg | Freq: Once | INTRAMUSCULAR | Status: AC
  Administered 2013-09-03: 4 mg via INTRAVENOUS
  Filled 2013-09-03: qty 1

## 2013-09-03 NOTE — ED Notes (Signed)
Pt reports having intermittent centralized lower abdominal pain that began last week, however returned this morning and has worsened throughout the day. Pt also reports lower back, nausea, emesis, and constipation. Pt states that he has had a difficult time having a bowel movement since being seen at [facility] on 4/30, which he states he has used stool softeners, which he has had two bowel movements today. Pt is A/O x4 and ambulatory to triage without difficulty.

## 2013-09-03 NOTE — ED Provider Notes (Signed)
CSN: [id number]     Arrival date & time 09/03/13  2136 History   First MD Initiated Contact with Patient 09/03/13 2202     Chief Complaint  Patient presents with  . Abdominal Pain  . Back Pain     (Consider location/radiation/quality/duration/timing/severity/associated sxs/prior Treatment) The history is provided by the patient and medical records. No language interpreter was used.    Lawrence Brock is a 43 y.o. male  with a hx of pancreatitis, chronic low back pain presents to the Emergency Department complaining of gradual, persistent, progressively worsening centralized, lower abd pain onset 1 week ago; acutely worsening this AM. Associated symptoms include low back pain which is chronic and unchanged, nausea, vomiting (x1 3 days ago; NBNB), constipation.  Pt reports using stool softeners which produced BM x2 today, but without relief of the pain.  He reports he is taking narcotic pain medications for his back.   Patient reports feeling less bloated after his bowel movement then proceeded to eat [place] food, [place] french fries, large milkshake and margarita.   Abdominal pain significantly worsened after this however patient did not vomit. No aggravating or alleviating factors. Pt denies fever, chills, headache neck pain, chest pain, shortness of breath, diarrhea, weakness, dizziness, syncope..     Past Medical History  Diagnosis Date  . Pancreatitis   . Chronic back pain    History reviewed. No pertinent past surgical history. No family history on file. History  Substance Use Topics  . Smoking status: Current Every Day Smoker -- 0.50 packs/day  . Smokeless tobacco: Not on file  . Alcohol Use: Yes    Review of Systems  Constitutional: Negative for fever, diaphoresis, appetite change, fatigue and unexpected weight change.  HENT: Negative for mouth sores and trouble swallowing.   Respiratory: Negative for cough, chest tightness, shortness of breath, wheezing and stridor.    Cardiovascular: Negative for chest pain and palpitations.  Gastrointestinal: Positive for nausea, vomiting and abdominal pain. Negative for diarrhea, constipation, blood in stool, abdominal distention and rectal pain.  Genitourinary: Negative for dysuria, urgency, frequency, hematuria, flank pain and difficulty urinating.  Musculoskeletal: Positive for back pain (chronic). Negative for neck pain and neck stiffness.  Skin: Negative for rash.  Neurological: Negative for weakness.  Hematological: Negative for adenopathy.  Psychiatric/Behavioral: Negative for confusion.  All other systems reviewed and are negative.     Allergies  Review of patient's allergies indicates no known allergies.  Home Medications   Prior to Admission medications   Medication Sig Start Date End Date Taking? Authorizing Provider  diazepam (VALIUM) 5 MG tablet Take 1 tablet (5 mg total) by mouth every 8 (eight) hours as needed for muscle spasms. 07/22/13  Yes Kristen N Ward, DO  ibuprofen (ADVIL,MOTRIN) 200 MG tablet Take 400 mg by mouth every 6 (six) hours as needed. Patient used this medication for his muscle aches.   Yes Historical Provider, MD  ibuprofen (ADVIL,MOTRIN) 800 MG tablet Take 1 tablet (800 mg total) by mouth every 8 (eight) hours as needed for mild pain. 07/22/13  Yes [place], DO  HYDROcodone-ibuprofen (VICOPROFEN) 7.5-200 MG per tablet Take 1 tablet by mouth every 8 (eight) hours as needed for moderate pain. 09/04/13   Aiysha Jillson, PA-C  polyethylene glycol (MIRALAX / GLYCOLAX) packet Take 17 g by mouth daily. 09/04/13   Loys Hoselton, PA-C  promethazine (PHENERGAN) 25 MG tablet Take 1 tablet (25 mg total) by mouth every 6 (six) hours as needed for nausea or vomiting. 09/04/13  Manley Fason, PA-C   BP 133/97  Pulse 71  Temp(Src) 98.2 F (36.8 C) (Oral)  Resp 18  SpO2 100% Physical Exam  Nursing note and vitals reviewed. Constitutional: He is oriented to person, place,  and time. He appears well-developed and well-nourished. No distress.  HENT:  Head: Normocephalic and atraumatic.  Mouth/Throat: Oropharynx is clear and moist. No oropharyngeal exudate.  Eyes: Conjunctivae are normal. No scleral icterus.  Neck: Normal range of motion. Neck supple.  Full ROM without pain  Cardiovascular: Normal rate, regular rhythm, normal heart sounds and intact distal pulses.   No murmur heard. Pulmonary/Chest: Effort normal and breath sounds normal. No respiratory distress. He has no wheezes.  Abdominal: Soft. Bowel sounds are normal. He exhibits no distension and no mass. There is tenderness in the right upper quadrant. There is guarding. There is no rebound and no CVA tenderness.  Abdominal exam with tenderness in the epigastrium and right upper quadrant with voluntary guarding And no rebound, rigidity or peritoneal signs No CVA tenderness  Musculoskeletal: Normal range of motion.  Full range of motion of the T-spine and L-spine No tenderness to palpation of the spinous processes of the T-spine or L-spine Mild tenderness to palpation of the paraspinous muscles of the L-spine  Lymphadenopathy:    He has no cervical adenopathy.  Neurological: He is alert and oriented to person, place, and time. He has normal reflexes. Coordination normal.  Speech is clear and goal oriented, follows commands Normal 5/5 strength in upper and lower extremities bilaterally including dorsiflexion and plantar flexion, strong and equal grip strength Sensation normal to light and sharp touch Moves extremities without ataxia, coordination intact Normal gait Normal balance   Skin: Skin is warm and dry. No rash noted. He is not diaphoretic. No erythema.  Psychiatric: He has a normal mood and affect. His behavior is normal.    ED Course  Procedures (including critical care time) Labs Review Labs Reviewed  CBC WITH DIFFERENTIAL - Abnormal; Notable for the following:    Neutrophils Relative %  79 (*)    All other components within normal limits  COMPREHENSIVE METABOLIC PANEL - Abnormal; Notable for the following:    Glucose, Bld 116 (*)    All other components within normal limits  LIPASE, BLOOD - Abnormal; Notable for the following:    Lipase 92 (*)    All other components within normal limits  URINALYSIS, ROUTINE W REFLEX MICROSCOPIC    Imaging Review US Abdomen Complete  09/04/2013   CLINICAL DATA:  Epigastric abdominal pain and right upper quadrant abdominal tenderness.  EXAM: ULTRASOUND ABDOMEN COMPLETE  COMPARISON:  None.  FINDINGS: Gallbladder:  No gallstones or wall thickening visualized. No sonographic Murphy sign noted.  Common bile duct:  Diameter: 0.3 cm, within normal limits in caliber.  Liver:  No focal lesion identified. Within normal limits in parenchymal echogenicity.  IVC:  No abnormality visualized.  Pancreas:  Not visualized due to overlying structures.  Spleen:  Size and appearance within normal limits.  Right Kidney:  Length: 9.2 cm. Echogenicity within normal limits. No mass or hydronephrosis visualized.  Left Kidney:  Length: 10.4 cm. Echogenicity within normal limits. No hydronephrosis visualized. A 3.5 x 3.2 x 2.9 cm simple cyst is noted at the lower pole of the left kidney.  Abdominal aorta:  Not visualized on this study due to overlying bowel gas.  Other findings:  None.  IMPRESSION: 1. No acute abnormality seen in the abdomen. Evaluation is mildly suboptimal due to  overlying bowel gas. 2. Left renal cyst noted.   Electronically Signed   By: Garald Balding M.D.   On: 09/04/2013 01:16   Dg Abd Acute W/chest  09/03/2013   CLINICAL DATA:  Abdominal pain and back pain.  Nausea and vomiting.  EXAM: ACUTE ABDOMEN SERIES (ABDOMEN 2 VIEW & CHEST 1 VIEW)  COMPARISON:  Chest abdominal radiograph performed 05/18/2005, and 07/26/2005  FINDINGS: The lungs are well-aerated and clear. There is no evidence of focal opacification, pleural effusion or pneumothorax. The  cardiomediastinal silhouette is within normal limits.  The visualized bowel gas pattern is unremarkable. Scattered stool and air are seen within the colon; there is no evidence of small bowel dilatation to suggest obstruction. No free intra-abdominal air is identified on the provided upright view.  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  No acute osseous abnormalities are seen; the sacroiliac joints are unremarkable in appearance.  IMPRESSION: 1. Unremarkable bowel gas pattern; no free intra-abdominal air seen. 2. No acute cardiopulmonary process identified.   Electronically Signed   By: Garald Balding M.D.   On: 09/03/2013 23:41     EKG Interpretation None      No red flag s/s of low back pain. Patient was counseled on back pain precautions and told to do activity as tolerated but do not lift, push, or pull heavy objects more than 10 pounds for the next week. Patient counseled to use ice or heat on back for no longer than 15 minutes every hour.   Patient prescribed narcotic pain medicine and counseled on proper use of narcotic pain medications. Told he can increase to every 4 hrs if needed while pain is worse. Counseled not to combine this medication with others containing tylenol.   Urged patient not to drink alcohol, drive, or perform any other activities that requires focus while taking either of these medications.   Patient urged to follow-up with PCP if pain does not improve with treatment and rest or if pain becomes recurrent. Urged to return with worsening severe pain, loss of bowel or bladder control, trouble walking.   The patient verbalizes understanding and agrees with the plan.   MDM   Final diagnoses:  Pancreatitis  Chronic low back pain   Aayush Wernick presents with chronic back pain and epigastric pain.  Patient with history of alcohol abuse and pancreatitis secondary to this. Patient with epigastric and right upper quadrant abdominal pain  today. Will obtain ultrasound. He's also complaining of constipation due to his narcotic usage. Her pain is at baseline  With no red flags.  1:57 AM Patient labs reassuring without leukocytosis. Lipase elevated to 92, symptoms secondary to acute pancreatitis. Urinalysis without evidence of urinary tract infection or dehydration. Ultrasound without evidence of gallstones, cholecystitis or choledocholithiasis. Acute abdomen without evidence of significant constipation.  Patient reports he feels much better at this time. Tolerating by mouth without difficulty and ready to be discharged home. Patient spoke with his primary care or GI specialist for further evaluation  Patient with back pain.  No neurological deficits and normal neuro exam.  Patient can walk without difficulty or significant pain.  No loss of bowel or bladder control.  No concern for cauda equina.  No fever, night sweats, weight loss, h/o cancer, IVDU.  RICE protocol and pain medicine indicated and discussed with patient.   I have personally reviewed patient's vitals, nursing note and any pertinent labs or imaging.  I performed an undressed physical exam.  At this time, it has been determined that no acute conditions requiring further emergency intervention. The patient/guardian have been advised of the diagnosis and plan. I reviewed all labs and imaging including any potential incidental findings. We have discussed signs and symptoms that warrant return to the ED, such as high fevers, intractable vomiting, hematemesis.  Patient/guardian has voiced understanding and agreed to follow-up with the PCP or specialist in 3 days.  Vital signs are stable at discharge.   BP 133/97  Pulse 71  Temp(Src) 98.2 F (36.8 C) (Oral)  Resp 18  SpO2 100%        Abigail Butts, PA-C 09/04/13 0159

## 2013-09-04 MED ORDER — HYDROCODONE-IBUPROFEN 7.5-200 MG PO TABS: 1.0000 | ORAL_TABLET | Freq: Three times a day (TID) | ORAL | Status: DC | PRN

## 2013-09-04 MED ORDER — POLYETHYLENE GLYCOL 3350 17 G PO PACK: 17.0000 g | PACK | Freq: Every day | ORAL | Status: DC

## 2013-09-04 MED ORDER — PROMETHAZINE HCL 25 MG PO TABS: 25.0000 mg | ORAL_TABLET | Freq: Four times a day (QID) | ORAL | Status: DC | PRN

## 2013-09-04 NOTE — Discharge Instructions (Signed)
1. Medications: vicoprofen, and, MiraLax, usual home medications 2. Treatment: rest, drink plenty of fluids,  3. Follow Up: Please followup with your primary doctor gastroenterologist in 3 days for discussion of your diagnoses and further evaluation after today's visit; if you do not have a primary care doctor use the resource guide provided to find one;    Acute Pancreatitis Acute pancreatitis is a disease in which the pancreas becomes suddenly inflamed. The pancreas is a large gland located behind your stomach. The pancreas produces enzymes that help digest food. The pancreas also releases the hormones glucagon and insulin that help regulate blood sugar. Damage to the pancreas occurs when the digestive enzymes from the pancreas are activated and begin attacking the pancreas before being released into the intestine. Most acute attacks last a couple of days and can cause serious complications. Some people become dehydrated and develop low blood pressure. In severe cases, bleeding into the pancreas can lead to shock and can be life-threatening. The lungs, heart, and kidneys may fail. CAUSES  Pancreatitis can happen to anyone. In some cases, the cause is unknown. Most cases are caused by:  Alcohol abuse.  Gallstones. Other less common causes are:  Certain medicines.  Exposure to certain chemicals.  Infection.  Damage caused by an accident (trauma).  Abdominal surgery. SYMPTOMS   Pain in the upper abdomen that may radiate to the back.  Tenderness and swelling of the abdomen.  Nausea and vomiting. DIAGNOSIS  Your caregiver will perform a physical exam. Blood and stool tests may be done to confirm the diagnosis. Imaging tests may also be done, such as X-rays, CT scans, or an ultrasound of the abdomen. TREATMENT  Treatment usually requires a stay in the hospital. Treatment may include:  Pain medicine.  Fluid replacement through an intravenous line (IV).  Placing a tube in the  stomach to remove stomach contents and control vomiting.  Not eating for 3 or 4 days. This gives your pancreas a rest, because enzymes are not being produced that can cause further damage.  Antibiotic medicines if your condition is caused by an infection.  Surgery of the pancreas or gallbladder. HOME CARE INSTRUCTIONS   Follow the diet advised by your caregiver. This may involve avoiding alcohol and decreasing the amount of fat in your diet.  Eat smaller, more frequent meals. This reduces the amount of digestive juices the pancreas produces.  Drink enough fluids to keep your urine clear or pale yellow.  Only take over-the-counter or prescription medicines as directed by your caregiver.  Avoid drinking alcohol if it caused your condition.  Do not smoke.  Get plenty of rest.  Check your blood sugar at home as directed by your caregiver.  Keep all follow-up appointments as directed by your caregiver. SEEK MEDICAL CARE IF:   You do not recover as quickly as expected.  You develop new or worsening symptoms.  You have persistent pain, weakness, or nausea.  You recover and then have another episode of pain. SEEK IMMEDIATE MEDICAL CARE IF:   You are unable to eat or keep fluids down.  Your pain becomes severe.  You have a fever or persistent symptoms for more than 2 to 3 days.  You have a fever and your symptoms suddenly get worse.  Your skin or the white part of your eyes turn yellow (jaundice).  You develop vomiting.  You feel dizzy, or you faint.  Your blood sugar is high (over 300 mg/dL). MAKE SURE YOU:   Understand these  instructions.  Will watch your condition.  Will get help right away if you are not doing well or get worse. Document Released: 03/11/2005 Document Revised: 09/10/2011 Document Reviewed: 06/20/2011 [facility] Patient Information 2014 [place].    Emergency Department Resource Guide 1) Find a Doctor and Pay Out of Pocket Although you  won't have to find out who is covered by your insurance plan, it is a good idea to ask around and get recommendations. You will then need to call the office and see if the doctor you have chosen will accept you as a new patient and what types of options they offer for patients who are self-pay. Some doctors offer discounts or will set up payment plans for their patients who do not have insurance, but you will need to ask so you aren't surprised when you get to your appointment.  2) Contact Your Local Health Department Not all health departments have doctors that can see patients for sick visits, but many do, so it is worth a call to see if yours does. If you don't know where your local health department is, you can check in your phone book. The CDC also has a tool to help you locate your state's health department, and many state websites also have listings of all of their local health departments.  3) Find a [facility] If your illness is not likely to be very severe or complicated, you may want to try a walk in clinic. These are popping up all over the country in pharmacies, drugstores, and shopping centers. They're usually staffed by nurse practitioners or physician assistants that have been trained to treat common illnesses and complaints. They're usually fairly quick and inexpensive. However, if you have serious medical issues or chronic medical problems, these are probably not your best option.  No Primary Care Doctor: - Call Health Connect at  [phone] - they can help you locate a primary care doctor that  accepts your insurance, provides certain services, etc. - Physician Referral Service- [phone]  Chronic Pain Problems: Organization         Address  Phone   Notes  [facility]  [phone] Patients need to be referred by their primary care doctor.   Medication Assistance: Organization         Address  Phone   Notes  [facility] Medication [facility]: [address] [place], [place] [address] [phone] --Must be a resident of [facility] -- Must have NO insurance coverage whatsoever (no Medicaid/ Medicare, etc.) -- The pt. MUST have a primary care doctor that directs their care regularly and follows them in the community   MedAssist  [phone]   [employer]  [phone]    Agencies that provide inexpensive medical care: Organization         Address  Phone   Notes  [place]  [phone]   [facility]    [phone]   [facility] [place], [place] [address] [phone]   [place] [address] [place] [phone]   Planned Parenthood    [phone]   [facility]    [phone]   [place] and [place] [address], [place] Phone:  [phone], Fax:  [phone] Hours of Operation:  9 am - 6 pm, M-F.  Also accepts Medicaid/Medicare and self-pay.  [facility] for [place], [place] Phone: [phone], Fax: [phone]. Hours of Operation:  8:30 am - 5:30 pm, M-F.  Also accepts Medicaid and self-pay.  [facility] [place] [address] [place] Phone: [phone]   [place], [place], [place] [phone], Ext. [phone] Mondays & Thursdays: 7-9 AM.  First 15 patients are seen on a first come, first serve basis.    [place] Providers:  Organization         Address  Phone   Notes  [facility] [address], [place] [phone] Also accepts self-pay patients.  [facility] [address], [place]  [phone]   [place], [address], [place] [phone]   [facility] [address] [place] [phone]   Lucianne Lei [address], [place]   [phone] Only accepts [place] Access [place] patients after they have their name applied to their card.   Self-Pay (no insurance) in [facility]:  Organization         Address  Phone   Notes  Sickle Cell Patients, [facility] [place] [phone]   [facility] Urgent Care [place] [phone]   [facility] [place]  [place], [place], [place] [phone]   [facility]/Dr. Osei-Bonsu  [address], [place] or [address], [place] [phone] Phone number for both [place] and [place] locations is the same.  Urgent Medical and [facility] [address], [place] [phone]   [facility] [address], [place] or [address] [phone]   [facility] [address] [place] [phone], phone; [phone], fax Sees patients 1st and 3rd Saturday of every month.  Must not qualify for public or private insurance (i.e. Medicaid, Medicare, [place] Health Choice, Veterans' Benefits)  Household income should be no more than 200% of the poverty level The clinic cannot treat you if you are pregnant or think you are pregnant  Sexually transmitted diseases are not treated at the clinic.    Dental Care: Organization         Address  Phone  Notes  [place] Department of [facility] [place] [phone] Accepts children up to age 26 who are enrolled in [place] or [place]; pregnant women with a Medicaid card; and children who have applied for Medicaid or [place] Health Choice, but were declined, whose parents can pay a reduced fee at time of service.  [facility] Department of [facility]  [address], [place] [phone] Accepts  children up to age 52 who are enrolled in [place] or [place]; pregnant women with a Medicaid card; and children who have applied for Medicaid or [place] Health Choice, but were declined, whose parents can pay a reduced fee at time of service.  [place] Adult [facility] Access PROGRAM  [place] [phone] Patients are seen by appointment only. Walk-ins are not accepted. [place] will see patients 82 years of age and older. Monday - Tuesday (8am-5pm) Most Wednesdays (8:30-5pm) $30 per  visit, cash only  [facility] [place] [facility] Adult [employer] PROGRAM  [address], [facility] [phone] Patients are seen by appointment only. Walk-ins are not accepted. [place] will see patients 33 years of age and older. One Wednesday Evening (Monthly: Volunteer Based).  $30 per visit, cash only  [place]  [phone] for adults; Children under age 71, call Graduate Pediatric Dentistry at [phone]. Children aged 53-14, please call [phone] to request a pediatric application.  Dental services are provided in all areas of dental care including fillings, crowns and bridges, complete and partial dentures, implants, gum treatment, root canals, and extractions. Preventive care is also provided. Treatment is provided to both adults and children. Patients are selected via a lottery and there is often a waiting list.   [facility] [address], [place]  [phone] [email or web address]   [facility] [address] [place], [place] [phone], Ext. [phone] Second and Fourth Thursday of each month, opens at 6:30 AM; Clinic ends at 9 AM.  Patients are seen on a first-come first-served basis, and a limited number are seen during each clinic.   [facility]  [address] Hillard Danker [place], [place] [phone]   Eligibility Requirements You must have lived in [place], [place], or [place] counties for at least the  last three months.   You cannot be eligible for state or federal sponsored [employer], including [employer], [place], or [employer].   You generally cannot be eligible for healthcare insurance through your employer.    How to apply: Eligibility screenings are held every Tuesday and Wednesday afternoon from 1:00 pm until 4:00 pm. You do not need an appointment for the interview!  [facility] [address] [place], [place]   [place]  [place] Department  [place]  [phone]    Behavioral Health Resources in the Community: Intensive Outpatient Programs Organization         Address  Phone  Notes  [place]. [address] [place], [place] [phone]   [facility] Outpatient [address], [place], [place]   ADS: Alcohol & Drug Svcs [address] [place], [place]   [place] [address],  [place], [place] or [phone]   Substance Abuse Resources Organization         Address  Phone  Notes  Alcohol and Drug Services  [phone]   [place]  [phone]   [facility] [place]   Chinita Pester  [phone]   Residential & Outpatient Substance Abuse Program  [phone]   Psychological Services Organization         Address  Phone  Notes  [facility] [place]  [place]  [phone]   [place] [address] [place] or [phone]    Mobile Crisis Teams Organization         Address  Phone  Notes  [facility], Mobile Crisis Care Unit  [phone]   [facility]ive Psychotherapeutic Services  [address] [place], [place]   [place] [address] [place] [phone]     Self-Help/Support Groups Organization         Address  Phone  Notes  [facility]ental Health Assoc. of [place] - variety of support groups  [place] Call for more information  Narcotics Anonymous (NA), Caring Services [address], [employer] [place]  2 meetings at this location   Special educational needs teacher         Address  Phone  Notes  [facility] Residential Treatment [place],    [place]  [phone]   [facility]  [address] [place] [address], [place], [place]   [place], [place] [phone] Admissions: 8am-3pm M-F  Incentives Substance [place] [address]    [place], [place] [phone]   [facility] [address] [place], [place], [place]   [facility] [address],  [place], [place]   [facility] Programs - Intensive Outpatient [address], [place], [place]   [facility] ([place] [address]  [place], [place] [phone] or [phone]   [facility]idential Treatment Services (RTS) [address] [place], [place] Accepts Medicaid  [facility] [place] [address]  [place] [phone] Substance Abuse/Addiction Treatment   [facility] Organization         Address  Phone  Notes  [facility]  [phone]   Domenic Schwab, PhD [address] Arlis Porta [place], [place]   [phone] or [phone]   [place], [place] [phone]   [facility] [address] [place], [place] [phone] Insurance/Medicaid/sponsorship through [facility] and Families [address] [place]                                    [place] [address], [place] [phone] [place] [address], [place] [phone]    Dr. Adele Schilder  [phone]   Free  Clinic of [place] Dept. 1) [address] [place] 2) [place] 3)  [place] [address], [place] [phone]  [phone]   [place] [phone] or [phone] (After Hours)

## 2013-09-04 NOTE — ED Provider Notes (Signed)
Medical screening examination/treatment/procedure(s) were conducted as a shared visit with non-physician practitioner(s) and myself.  I personally evaluated the patient during the encounter.   EKG Interpretation None      I interviewed and examined the patient. Lungs are CTAB. Cardiac exam wnl. Abdomen soft w/ ttp in RUQ and epig area. Plan for US/labs. Suspect pancreatitis.   Blanchard Kelch, MD 09/04/13 [phone]

## 2013-09-08 ENCOUNTER — Emergency Department (HOSPITAL_COMMUNITY): Payer: Managed Care, Other (non HMO)

## 2013-09-08 ENCOUNTER — Encounter (HOSPITAL_COMMUNITY): Payer: Self-pay | Admitting: Emergency Medicine

## 2013-09-08 ENCOUNTER — Inpatient Hospital Stay (HOSPITAL_COMMUNITY)
Admission: EM | Admit: 2013-09-08 | Discharge: 2013-09-15 | DRG: 439 | Disposition: A | Discharge status: Home / Self Care | Payer: Managed Care, Other (non HMO) | Attending: Internal Medicine | Admitting: Internal Medicine

## 2013-09-08 DIAGNOSIS — K860 Alcohol-induced chronic pancreatitis: Secondary | ICD-10-CM

## 2013-09-08 DIAGNOSIS — F17200 Nicotine dependence, unspecified, uncomplicated: Secondary | ICD-10-CM | POA: Diagnosis present

## 2013-09-08 DIAGNOSIS — F1020 Alcohol dependence, uncomplicated: Secondary | ICD-10-CM | POA: Diagnosis present

## 2013-09-08 DIAGNOSIS — M549 Dorsalgia, unspecified: Secondary | ICD-10-CM | POA: Diagnosis present

## 2013-09-08 DIAGNOSIS — E876 Hypokalemia: Secondary | ICD-10-CM | POA: Diagnosis present

## 2013-09-08 DIAGNOSIS — D696 Thrombocytopenia, unspecified: Secondary | ICD-10-CM | POA: Diagnosis present

## 2013-09-08 DIAGNOSIS — G8929 Other chronic pain: Secondary | ICD-10-CM | POA: Diagnosis present

## 2013-09-08 DIAGNOSIS — K858 Other acute pancreatitis without necrosis or infection: Secondary | ICD-10-CM

## 2013-09-08 DIAGNOSIS — K862 Cyst of pancreas: Secondary | ICD-10-CM | POA: Diagnosis present

## 2013-09-08 DIAGNOSIS — Z4689 Encounter for fitting and adjustment of other specified devices: Secondary | ICD-10-CM

## 2013-09-08 DIAGNOSIS — D638 Anemia in other chronic diseases classified elsewhere: Secondary | ICD-10-CM | POA: Diagnosis present

## 2013-09-08 DIAGNOSIS — K859 Acute pancreatitis without necrosis or infection, unspecified: Principal | ICD-10-CM | POA: Diagnosis present

## 2013-09-08 DIAGNOSIS — K2960 Other gastritis without bleeding: Secondary | ICD-10-CM | POA: Diagnosis present

## 2013-09-08 DIAGNOSIS — K863 Pseudocyst of pancreas: Secondary | ICD-10-CM | POA: Diagnosis present

## 2013-09-08 DIAGNOSIS — K861 Other chronic pancreatitis: Secondary | ICD-10-CM | POA: Diagnosis present

## 2013-09-08 DIAGNOSIS — K269 Duodenal ulcer, unspecified as acute or chronic, without hemorrhage or perforation: Secondary | ICD-10-CM | POA: Diagnosis present

## 2013-09-08 DIAGNOSIS — R1013 Epigastric pain: Secondary | ICD-10-CM | POA: Diagnosis not present

## 2013-09-08 DIAGNOSIS — K5900 Constipation, unspecified: Secondary | ICD-10-CM | POA: Diagnosis present

## 2013-09-08 DIAGNOSIS — R509 Fever, unspecified: Secondary | ICD-10-CM | POA: Diagnosis not present

## 2013-09-08 DIAGNOSIS — D649 Anemia, unspecified: Secondary | ICD-10-CM

## 2013-09-08 DIAGNOSIS — K852 Alcohol induced acute pancreatitis without necrosis or infection: Secondary | ICD-10-CM

## 2013-09-08 HISTORY — DX: Other chronic pancreatitis: K86.1

## 2013-09-08 LAB — CBC WITH DIFFERENTIAL/PLATELET
Basophils Absolute: 0 10*3/uL (ref 0.0–0.1)
Basophils Relative: 0 % (ref 0–1)
EOS ABS: 0.1 10*3/uL (ref 0.0–0.7)
Eosinophils Relative: 1 % (ref 0–5)
HCT: 38.9 % — ABNORMAL LOW (ref 39.0–52.0)
Hemoglobin: 13 g/dL (ref 13.0–17.0)
LYMPHS ABS: 1.2 10*3/uL (ref 0.7–4.0)
LYMPHS PCT: 12 % (ref 12–46)
MCH: 30.3 pg (ref 26.0–34.0)
MCHC: 33.4 g/dL (ref 30.0–36.0)
MCV: 90.7 fL (ref 78.0–100.0)
Monocytes Absolute: 0.8 10*3/uL (ref 0.1–1.0)
Monocytes Relative: 8 % (ref 3–12)
NEUTROS PCT: 79 % — AB (ref 43–77)
Neutro Abs: 7.7 10*3/uL (ref 1.7–7.7)
PLATELETS: 145 10*3/uL — AB (ref 150–400)
RBC: 4.29 MIL/uL (ref 4.22–5.81)
RDW: 11.9 % (ref 11.5–15.5)
WBC: 9.8 10*3/uL (ref 4.0–10.5)

## 2013-09-08 LAB — I-STAT CHEM 8, ED
BUN: 21 mg/dL (ref 6–23)
CREATININE: 0.9 mg/dL (ref 0.50–1.35)
Calcium, Ion: 1.18 mmol/L (ref 1.12–1.23)
Chloride: 102 mEq/L (ref 96–112)
GLUCOSE: 105 mg/dL — AB (ref 70–99)
HCT: 41 % (ref 39.0–52.0)
HEMOGLOBIN: 13.9 g/dL (ref 13.0–17.0)
POTASSIUM: 3.7 meq/L (ref 3.7–5.3)
Sodium: 140 mEq/L (ref 137–147)
TCO2: 23 mmol/L (ref 0–100)

## 2013-09-08 LAB — COMPREHENSIVE METABOLIC PANEL
ALK PHOS: 51 U/L (ref 39–117)
ALT: 12 U/L (ref 0–53)
AST: 13 U/L (ref 0–37)
Albumin: 3.2 g/dL — ABNORMAL LOW (ref 3.5–5.2)
BUN: 23 mg/dL (ref 6–23)
CO2: 24 meq/L (ref 19–32)
Calcium: 8.9 mg/dL (ref 8.4–10.5)
Chloride: 102 mEq/L (ref 96–112)
Creatinine, Ser: 0.9 mg/dL (ref 0.50–1.35)
GFR calc Af Amer: >90 mL/min (ref >90)
GFR calc non Af Amer: >90 mL/min (ref >90)
Glucose, Bld: 103 mg/dL — ABNORMAL HIGH (ref 70–99)
POTASSIUM: 3.5 meq/L — AB (ref 3.7–5.3)
SODIUM: 139 meq/L (ref 137–147)
TOTAL PROTEIN: 7.1 g/dL (ref 6.0–8.3)
Total Bilirubin: 0.5 mg/dL (ref 0.3–1.2)

## 2013-09-08 LAB — URINALYSIS, ROUTINE W REFLEX MICROSCOPIC
Bilirubin Urine: NEGATIVE
GLUCOSE, UA: NEGATIVE mg/dL
HGB URINE DIPSTICK: NEGATIVE
Ketones, ur: NEGATIVE mg/dL
Leukocytes, UA: NEGATIVE
Nitrite: NEGATIVE
Protein, ur: NEGATIVE mg/dL
SPECIFIC GRAVITY, URINE: 1.02 (ref 1.005–1.030)
UROBILINOGEN UA: 1 mg/dL (ref 0.0–1.0)
pH: 5.5 (ref 5.0–8.0)

## 2013-09-08 LAB — LIPASE, BLOOD: Lipase: 114 U/L — ABNORMAL HIGH (ref 11–59)

## 2013-09-08 LAB — LACTATE DEHYDROGENASE: LDH: 153 U/L (ref 94–250)

## 2013-09-08 MED ORDER — HEPARIN SODIUM (PORCINE) 5000 UNIT/ML IJ SOLN
5000.0000 [IU] | Freq: Three times a day (TID) | INTRAMUSCULAR | Status: DC
  Administered 2013-09-08 – 2013-09-12 (×12): 5000 [IU] via SUBCUTANEOUS
  Filled 2013-09-08 (×17): qty 1

## 2013-09-08 MED ORDER — MORPHINE SULFATE 4 MG/ML IJ SOLN
4.0000 mg | Freq: Once | INTRAMUSCULAR | Status: AC
Start: 2013-09-08 — End: 2013-09-08
  Administered 2013-09-08: 4 mg via INTRAVENOUS
  Filled 2013-09-08: qty 1

## 2013-09-08 MED ORDER — ONDANSETRON HCL 4 MG/2ML IJ SOLN
4.0000 mg | Freq: Once | INTRAMUSCULAR | Status: AC
  Administered 2013-09-08: 4 mg via INTRAVENOUS
  Filled 2013-09-08: qty 2

## 2013-09-08 MED ORDER — ONDANSETRON HCL 4 MG/2ML IJ SOLN: 4.0000 mg | Freq: Four times a day (QID) | INTRAMUSCULAR | Status: DC | PRN

## 2013-09-08 MED ORDER — MORPHINE SULFATE 4 MG/ML IJ SOLN
4.0000 mg | Freq: Once | INTRAMUSCULAR | Status: AC
  Administered 2013-09-08: 4 mg via INTRAVENOUS
  Filled 2013-09-08: qty 1

## 2013-09-08 MED ORDER — MORPHINE SULFATE 2 MG/ML IJ SOLN
2.0000 mg | INTRAMUSCULAR | Status: DC | PRN
  Administered 2013-09-08 – 2013-09-09 (×3): 2 mg via INTRAVENOUS
  Filled 2013-09-08 (×3): qty 1

## 2013-09-08 MED ORDER — HYDROCODONE-IBUPROFEN 7.5-200 MG PO TABS: 1.0000 | ORAL_TABLET | Freq: Three times a day (TID) | ORAL | Status: DC | PRN

## 2013-09-08 MED ORDER — KCL IN DEXTROSE-NACL 20-5-0.45 MEQ/L-%-% IV SOLN
INTRAVENOUS | Status: DC
  Administered 2013-09-08 – 2013-09-13 (×7): via INTRAVENOUS
  Filled 2013-09-08 (×12): qty 1000

## 2013-09-08 MED ORDER — HYDROMORPHONE HCL PF 1 MG/ML IJ SOLN
1.0000 mg | Freq: Once | INTRAMUSCULAR | Status: AC
  Administered 2013-09-08: 1 mg via INTRAVENOUS
  Filled 2013-09-08: qty 1

## 2013-09-08 MED ORDER — SODIUM CHLORIDE 0.9 % IV BOLUS (SEPSIS)
1000.0000 mL | Freq: Once | INTRAVENOUS | Status: AC
  Administered 2013-09-08: 1000 mL via INTRAVENOUS

## 2013-09-08 MED ORDER — DEXTROSE-NACL 5-0.45 % IV SOLN: INTRAVENOUS | Status: DC

## 2013-09-08 MED ORDER — IOHEXOL 300 MG/ML  SOLN
100.0000 mL | Freq: Once | INTRAMUSCULAR | Status: AC | PRN
  Administered 2013-09-08: 100 mL via INTRAVENOUS

## 2013-09-08 MED ORDER — ONDANSETRON HCL 4 MG PO TABS: 4.0000 mg | ORAL_TABLET | Freq: Four times a day (QID) | ORAL | Status: DC | PRN

## 2013-09-08 NOTE — ED Notes (Signed)
Admitting MD at bedside at this time.

## 2013-09-08 NOTE — ED Provider Notes (Signed)
CSN: [id number]     Arrival date & time 09/08/13  1441 History   First MD Initiated Contact with Patient 09/08/13 1502     Chief Complaint  Patient presents with  . Abdominal Pain     (Consider location/radiation/quality/duration/timing/severity/associated sxs/prior Treatment) Patient is a 43 y.o. male presenting with abdominal pain. The history is provided by the patient.  Abdominal Pain Associated symptoms: nausea   Associated symptoms: no chest pain, no diarrhea, no shortness of breath and no vomiting    patient presents with upper bowel pain. He's had it for a couple weeks. He was diagnosed with pancreatitis 5 days ago. He was seen in the ER. He had an ultrasound done at that time. His previous history of pancreatitis. He states he got home his been doing worse. He has had nausea without vomiting. He has increased pain with anything it eats. He has had some constipation. He states the pain has only controlled for a few hours with the Percocet. He states he's been able to eat and has gotten bad enough to come in here. He states the pain did get worse before the last visit when he drank too swallows of margarita.  Past Medical History  Diagnosis Date  . Pancreatitis   . Chronic back pain    No past surgical history on file. No family history on file. History  Substance Use Topics  . Smoking status: Current Every Day Smoker -- 0.50 packs/day  . Smokeless tobacco: Not on file  . Alcohol Use: Yes    Review of Systems  Constitutional: Positive for appetite change. Negative for activity change.  Eyes: Negative for pain.  Respiratory: Negative for chest tightness and shortness of breath.   Cardiovascular: Negative for chest pain and leg swelling.  Gastrointestinal: Positive for nausea and abdominal pain. Negative for vomiting and diarrhea.  Genitourinary: Negative for flank pain.  Musculoskeletal: Positive for back pain. Negative for neck stiffness.  Skin: Negative for rash.   Neurological: Negative for weakness, numbness and headaches.  Psychiatric/Behavioral: Negative for behavioral problems.      Allergies  Review of patient's allergies indicates no known allergies.  Home Medications   Prior to Admission medications   Medication Sig Start Date End Date Taking? Authorizing Cosimo Schertzer  HYDROcodone-ibuprofen (VICOPROFEN) 7.5-200 MG per tablet Take 1 tablet by mouth every 8 (eight) hours as needed for moderate pain.   Yes Historical Adaijah Endres, MD  polyethylene glycol (MIRALAX / GLYCOLAX) packet Take 17 g by mouth daily. 09/04/13  Yes Hannah Muthersbaugh, PA-C   BP 137/91  Pulse 79  Temp(Src) 100.5 F (38.1 C) (Oral)  Resp 18  SpO2 97% Physical Exam  Nursing note and vitals reviewed. Constitutional: He is oriented to person, place, and time. He appears well-developed and well-nourished.  Patient appears uncomfortable  HENT:  Head: Normocephalic and atraumatic.  Eyes: EOM are normal. Pupils are equal, round, and reactive to light.  Neck: Normal range of motion. Neck supple.  Cardiovascular: Normal rate, regular rhythm and normal heart sounds.   No murmur heard. Pulmonary/Chest: Effort normal and breath sounds normal.  Abdominal: He exhibits no distension and no mass. There is tenderness. There is no rebound and no guarding.  Diffuse tenderness. Some guarding. No hernias palpated.  Genitourinary:  No CVA tenderness  Musculoskeletal: Normal range of motion. He exhibits no edema.  Neurological: He is alert and oriented to person, place, and time. No cranial nerve deficit.  Skin: Skin is warm and dry.  Psychiatric: He has a  normal mood and affect.    ED Course  Procedures (including critical care time) Labs Review Labs Reviewed  CBC WITH DIFFERENTIAL - Abnormal; Notable for the following:    HCT 38.9 (*)    Platelets 145 (*)    Neutrophils Relative % 79 (*)    All other components within normal limits  COMPREHENSIVE METABOLIC PANEL - Abnormal;  Notable for the following:    Potassium 3.5 (*)    Glucose, Bld 103 (*)    Albumin 3.2 (*)    All other components within normal limits  LIPASE, BLOOD - Abnormal; Notable for the following:    Lipase 114 (*)    All other components within normal limits  I-STAT CHEM 8, ED - Abnormal; Notable for the following:    Glucose, Bld 105 (*)    All other components within normal limits  URINALYSIS, ROUTINE W REFLEX MICROSCOPIC  LACTATE DEHYDROGENASE    Imaging Review Ct Abdomen Pelvis W Contrast  09/08/2013   CLINICAL DATA:  Patient diagnosed with pancreatitis on September 04, 2013. Now has worsened abdomen pain.  EXAM: CT ABDOMEN AND PELVIS WITH CONTRAST  TECHNIQUE: Multidetector CT imaging of the abdomen and pelvis was performed using the standard protocol following bolus administration of intravenous contrast.  CONTRAST:  131mL OMNIPAQUE IOHEXOL 300 MG/ML  SOLN  COMPARISON:  CT abdomen and pelvis April 09, 2005  FINDINGS: There is edema with multiloculated cystic structure in the pancreatic head and peripancreatic stranding consistent with patient's known pancreatitis with pancreatic pseudocyst measuring 2.7 x 4.8 cm. There is chronic pancreatic duct dilatation with changes of chronic pancreatitis.  There is mild diffuse fatty infiltration of liver. The gallbladder, spleen, adrenal glands are normal. There are cysts in bilateral kidneys, largest in the lower pole left kidney measuring 3.2 x 3.4 cm. There is no hydronephrosis bilaterally. There is atherosclerosis of the abdominal aorta without aneurysmal dilatation. There is no abdominal lymphadenopathy. There is no small bowel obstruction or diverticulitis. The appendix is not seen but no inflammation is noted around the cecum.  Fluid-filled bladder is normal. There is no pelvic lymphadenopathy. No acute abnormality is identified within the visualized bones. There is atelectasis of the bilateral lung bases.  IMPRESSION: Acute superimposed on chronic  pancreatitis. 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   Electronically Signed   By: Abelardo Diesel M.D.   On: 09/08/2013 19:11   Dg Abd Acute W/chest  09/08/2013   CLINICAL DATA:  Pain and fever.  EXAM: ACUTE ABDOMEN SERIES (ABDOMEN 2 VIEW & CHEST 1 VIEW)  COMPARISON:  Abdominal series 09/03/2013 .  FINDINGS: Mediastinum and hilar structures are normal. Mild basilar atelectasis. No pleural effusion or pneumothorax. Heart size normal.  Soft tissues of the abdomen are unremarkable. What appears to be a pancreatic duct stent is in stable position. Calcifications noted throughout the pancreas consistent with chronic pancreatitis. Large amount of stool in colon consistent with constipation. No bowel distention or free air.  IMPRESSION: 1. Constipation. 2. Pancreatic duct stent noted in stable position. Pancreatic calcifications consistent with chronic pancreatitis. 3. Bibasilar subsegmental atelectasis.   Electronically Signed   By: Marcello Moores  Register   On: 09/08/2013 17:05     EKG Interpretation None      MDM   Final diagnoses:  Pancreatitis    Patient was abdominal pain. Recently diagnosed pancreatitis. Lipase has improved. Does have low-grade temperature. Diffuse tenderness. A CT scan done and shows pseudocyst and acute on chronic pancreatitis. He's had difficulty managing  at home and will be admitted to internal medicine    Jasper Riling. Alvino Chapel, MD 09/08/13 2020

## 2013-09-08 NOTE — ED Notes (Signed)
Pt was dx w/ pancreatitis on 6/13.  Sent home with pain meds but now pain is worse.  Pt unable to sit still.  Pt pale, diaphoretic.  No NVD.

## 2013-09-08 NOTE — ED Notes (Signed)
Pt is aware of the need for urine, urinal at bedside  

## 2013-09-08 NOTE — H&P (Addendum)
[facility] History and Physical  Lawrence Brock DTO:[id number] DOB: 1970-07-11 DOA: 09/08/2013  Referring physician: Dr. Alvino Chapel PCP: No primary provider on file.   Chief Complaint: Abdominal discomfort  HPI: Lawrence Brock is a 43 y.o. male  With history of chronic pancreatitis. Patient states he has had abdominal discomfort for the last 2 weeks. But for the last 5 days the abdominal discomfort has been persistent and getting worse. It is worsened with any food intake. He denies any recent use of alcohol although he reported to the ED physician that he took a few drinks of margaritas which he did not admit to me. He denies any fevers at home or chills. He denies any change in bowel movement. States that the abdominal discomfort is mainly at the top part of his abdomen and radiates to the back. It is characterized as sharp.  While in the ED patient was found to have elevated lipase level and CT findings consistent with acute pancreatitis.   Review of Systems:  Constitutional:  No weight loss, night sweats, Fevers, chills, fatigue.  HEENT:  No headaches, Difficulty swallowing,Tooth/dental problems,Sore throat,  No sneezing, itching, ear ache, nasal congestion, post nasal drip,  Cardio-vascular:  No chest pain, Orthopnea, PND, swelling in lower extremities, anasarca, dizziness, palpitations  GI:  No heartburn, indigestion, + abdominal pain,+ nausea, vomiting, diarrhea, change in bowel habits, loss of appetite  Resp:  No shortness of breath with exertion or at rest. No excess mucus, no productive cough, No non-productive cough, No coughing up of blood.No change in color of mucus.No wheezing.No chest wall deformity  Skin:  no rash or lesions.  GU:  no dysuria, change in color of urine, no urgency or frequency. No flank pain.  Musculoskeletal:  No joint pain or swelling. No decreased range of motion. No back pain.  Psych:  No change in mood or affect. No depression or anxiety. No  memory loss.   Past Medical History  Diagnosis Date  . Pancreatitis   . Chronic back pain    No past surgical history on file. Social History:  reports that he has been smoking.  He does not have any smokeless tobacco history on file. He reports that he drinks alcohol. He reports that he does not use illicit drugs.  No Known Allergies  No family history on file.   Prior to Admission medications   Medication Sig Start Date End Date Taking? Authorizing Provider  HYDROcodone-ibuprofen (VICOPROFEN) 7.5-200 MG per tablet Take 1 tablet by mouth every 8 (eight) hours as needed for moderate pain.   Yes Historical Provider, MD  polyethylene glycol (MIRALAX / GLYCOLAX) packet Take 17 g by mouth daily. 09/04/13  Yes Hannah Muthersbaugh, PA-C   Physical Exam: Filed Vitals:   09/08/13 2027  BP: 141/83  Pulse: 79  Temp: 100 F (37.8 C)  Resp: 20    BP 141/83  Pulse 79  Temp(Src) 100 F (37.8 C) (Oral)  Resp 20  Wt 67.903 kg (149 lb 11.2 oz)  SpO2 97%  General:  Appears calm and comfortable Eyes: PERRL, normal lids, irises & conjunctiva ENT: grossly normal hearing, lips & tongue Neck: no LAD, masses or thyromegaly Cardiovascular: RRR, no m/r/g. No LE edema. Respiratory: CTA bilaterally, no w/r/r. Normal respiratory effort. Abdomen: soft, addendum epigastric discomfort with palpation, positive bowel sounds Skin: no rash or induration seen on limited exam Musculoskeletal: grossly normal tone BUE/BLE Psychiatric: grossly normal mood and affect, speech fluent and appropriate Neurologic: grossly non-focal.  Labs on Admission:  Basic Metabolic Panel:  Recent Labs Lab 09/03/13 2223 09/08/13 1531 09/08/13 1553  NA 138 139 140  K 4.0 3.5* 3.7  CL 100 102 102  CO2 24 24  --   GLUCOSE 116* 103* 105*  BUN 23 23 21   CREATININE 0.97 0.90 0.90  CALCIUM 9.7 8.9  --    Liver Function Tests:  Recent Labs Lab 09/03/13 2223 09/08/13 1531  AST 17 13  ALT 21 12  ALKPHOS  76 51  BILITOT 0.3 0.5  PROT 7.9 7.1  ALBUMIN 3.8 3.2*    Recent Labs Lab 09/03/13 2223 09/08/13 1531  LIPASE 92* 114*   No results found for this basename: AMMONIA,  in the last 168 hours CBC:  Recent Labs Lab 09/03/13 2223 09/08/13 1531 09/08/13 1553  WBC 7.8 9.8  --   NEUTROABS 6.1 7.7  --   HGB 14.6 13.0 13.9  HCT 43.3 38.9* 41.0  MCV 90.8 90.7  --   PLT 169 145*  --    Cardiac Enzymes: No results found for this basename: CKTOTAL, CKMB, CKMBINDEX, TROPONINI,  in the last 168 hours  BNP (last 3 results) No results found for this basename: PROBNP,  in the last 8760 hours CBG: No results found for this basename: GLUCAP,  in the last 168 hours  Radiological Exams on Admission: Ct Abdomen Pelvis W Contrast  09/08/2013   CLINICAL DATA:  Patient diagnosed with pancreatitis on September 04, 2013. Now has worsened abdomen pain.  EXAM: CT ABDOMEN AND PELVIS WITH CONTRAST  TECHNIQUE: Multidetector CT imaging of the abdomen and pelvis was performed using the standard protocol following bolus administration of intravenous contrast.  CONTRAST:  169mL OMNIPAQUE IOHEXOL 300 MG/ML  SOLN  COMPARISON:  CT abdomen and pelvis April 09, 2005  FINDINGS: There is edema with multiloculated cystic structure in the pancreatic head and peripancreatic stranding consistent with patient's known pancreatitis with pancreatic pseudocyst measuring 2.7 x 4.8 cm. There is chronic pancreatic duct dilatation with changes of chronic pancreatitis.  There is mild diffuse fatty infiltration of liver. The gallbladder, spleen, adrenal glands are normal. There are cysts in bilateral kidneys, largest in the lower pole left kidney measuring 3.2 x 3.4 cm. There is no hydronephrosis bilaterally. There is atherosclerosis of the abdominal aorta without aneurysmal dilatation. There is no abdominal lymphadenopathy. There is no small bowel obstruction or diverticulitis. The appendix is not seen but no inflammation is noted around  the cecum.  Fluid-filled bladder is normal. There is no pelvic lymphadenopathy. No acute abnormality is identified within the visualized bones. There is atelectasis of the bilateral lung bases.  IMPRESSION: Acute superimposed on chronic pancreatitis. 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   Electronically Signed   By: Abelardo Diesel M.D.   On: 09/08/2013 19:11   Dg Abd Acute W/chest  09/08/2013   CLINICAL DATA:  Pain and fever.  EXAM: ACUTE ABDOMEN SERIES (ABDOMEN 2 VIEW & CHEST 1 VIEW)  COMPARISON:  Abdominal series 09/03/2013 .  FINDINGS: Mediastinum and hilar structures are normal. Mild basilar atelectasis. No pleural effusion or pneumothorax. Heart size normal.  Soft tissues of the abdomen are unremarkable. What appears to be a pancreatic duct stent is in stable position. Calcifications noted throughout the pancreas consistent with chronic pancreatitis. Large amount of stool in colon consistent with constipation. No bowel distention or free air.  IMPRESSION: 1. Constipation. 2. Pancreatic duct stent noted in stable position. Pancreatic calcifications consistent with chronic pancreatitis. 3.  Bibasilar subsegmental atelectasis.   Electronically Signed   By: Marcello Moores  Register   On: 09/08/2013 17:05    Assessment/Plan Active Problems:   Pancreatitis   Acute pancreatitis - Confirmed on CT of abdomen. - Bowel rest, pain control, maintenance IV fluids - Obtain fasting lipid panel - Reassess lipase levels next a.m.  DVT prophylaxis Heparin SQ  Code Status: Full code Family Communication: Discussed with patient and family bedside Disposition Plan: Med surg  Time spent:> 45 minutes  Velvet Bathe [facility] Pager [phone]  **Disclaimer: This note may have been dictated with voice recognition software. Similar sounding words can inadvertently be transcribed and this note may contain transcription errors which may not have been corrected upon publication of note.**

## 2013-09-09 DIAGNOSIS — D696 Thrombocytopenia, unspecified: Secondary | ICD-10-CM | POA: Diagnosis present

## 2013-09-09 DIAGNOSIS — E876 Hypokalemia: Secondary | ICD-10-CM

## 2013-09-09 DIAGNOSIS — K5900 Constipation, unspecified: Secondary | ICD-10-CM | POA: Diagnosis present

## 2013-09-09 DIAGNOSIS — D649 Anemia, unspecified: Secondary | ICD-10-CM | POA: Diagnosis present

## 2013-09-09 DIAGNOSIS — K863 Pseudocyst of pancreas: Secondary | ICD-10-CM | POA: Diagnosis present

## 2013-09-09 LAB — LIPID PANEL
Cholesterol: 93 mg/dL (ref 0–200)
HDL: 34 mg/dL — ABNORMAL LOW (ref >39)
LDL CALC: 50 mg/dL (ref 0–99)
Total CHOL/HDL Ratio: 2.7 RATIO
Triglycerides: 45 mg/dL (ref <150)
VLDL: 9 mg/dL (ref 0–40)

## 2013-09-09 LAB — LIPASE, BLOOD: LIPASE: 53 U/L (ref 11–59)

## 2013-09-09 LAB — BASIC METABOLIC PANEL
BUN: 14 mg/dL (ref 6–23)
CHLORIDE: 103 meq/L (ref 96–112)
CO2: 23 mEq/L (ref 19–32)
Calcium: 8.6 mg/dL (ref 8.4–10.5)
Creatinine, Ser: 0.9 mg/dL (ref 0.50–1.35)
GFR calc Af Amer: >90 mL/min (ref >90)
Glucose, Bld: 114 mg/dL — ABNORMAL HIGH (ref 70–99)
POTASSIUM: 3.7 meq/L (ref 3.7–5.3)
SODIUM: 138 meq/L (ref 137–147)

## 2013-09-09 LAB — CBC
HEMATOCRIT: 38.4 % — AB (ref 39.0–52.0)
HEMOGLOBIN: 12.9 g/dL — AB (ref 13.0–17.0)
MCH: 30.4 pg (ref 26.0–34.0)
MCHC: 33.6 g/dL (ref 30.0–36.0)
MCV: 90.4 fL (ref 78.0–100.0)
Platelets: 125 10*3/uL — ABNORMAL LOW (ref 150–400)
RBC: 4.25 MIL/uL (ref 4.22–5.81)
RDW: 11.8 % (ref 11.5–15.5)
WBC: 10.3 10*3/uL (ref 4.0–10.5)

## 2013-09-09 MED ORDER — ACETAMINOPHEN 325 MG PO TABS
650.0000 mg | ORAL_TABLET | ORAL | Status: DC | PRN
  Administered 2013-09-09 – 2013-09-11 (×5): 650 mg via ORAL
  Filled 2013-09-09 (×5): qty 2

## 2013-09-09 MED ORDER — POLYETHYLENE GLYCOL 3350 17 G PO PACK
17.0000 g | PACK | Freq: Every day | ORAL | Status: DC
  Administered 2013-09-09 – 2013-09-12 (×4): 17 g via ORAL
  Filled 2013-09-09 (×5): qty 1

## 2013-09-09 MED ORDER — DOCUSATE SODIUM 100 MG PO CAPS
100.0000 mg | ORAL_CAPSULE | Freq: Two times a day (BID) | ORAL | Status: DC
  Administered 2013-09-09 – 2013-09-15 (×11): 100 mg via ORAL
  Filled 2013-09-09 (×14): qty 1

## 2013-09-09 MED ORDER — MORPHINE SULFATE 4 MG/ML IJ SOLN
4.0000 mg | INTRAMUSCULAR | Status: DC | PRN
  Administered 2013-09-09 – 2013-09-10 (×9): 4 mg via INTRAVENOUS
  Filled 2013-09-09 (×9): qty 1

## 2013-09-09 NOTE — Progress Notes (Signed)
Progress Note   Lawrence Brock UMP:[id number] DOB: 26-Jun-1970 DOA: 09/08/2013 PCP: No primary provider on file.   Brief Narrative:   Lawrence Brock is an 43 y.o. male with a PMH of chronic pancreatitis who was admitted 09/08/13 chief complaint of a two-week history of progressively worsening abdominal discomfort. Upon initial evaluation in the ED, the patient had elevated lipase levels and CT findings consistent with acute pancreatitis.  Assessment/Plan:   Principal Problem: Acute pancreatitis/pancreatic pseudocyst  Likely triggered by alcohol use.  Followup triglycerides.  Continue conservative care with bowel rest, anti-nausea medication, and pain medication.  Active Problems: Hypokalemia  Resolved with potassium added to IV fluids.  Thrombocytopenia, unspecified/Normocytic anemia  Suspect related to bone marrow suppression from alcohol use but may be reflective of chronic inflammation.  Unspecified constipation  Add stool softener and MiraLAX.  DVT Prophylaxis  Continue heparin.  Code Status: Full. Family Communication: Wife updated at bedside. Disposition Plan: Home when stable.   IV Access:    Peripheral IV   Procedures:    None.   Medical Consultants:    None.   Other Consultants:    None.   Anti-Infectives:    None.  Subjective:   Lawrence Brock complains of 7/10, sharp, stabbing pain in the mid epigastric area.  No nausea or vomiting.  No black or bloody stools.  Objective:    Filed Vitals:   09/08/13 2020 09/08/13 2027 09/09/13 0541 09/09/13 0757  BP:  141/83 122/79   Pulse:  79 64   Temp: 100.4 F (38 C) 100 F (37.8 C) 98.7 F (37.1 C)   TempSrc: Oral Oral Oral   Resp:  20 18   Height:    5' 8.5" (1.74 m)  Weight:  67.903 kg (149 lb 11.2 oz)    SpO2:  97% 95%     Intake/Output Summary (Last 24 hours) at 09/09/13 0759 Last data filed at 09/09/13 4008  Gross per 24 hour  Intake 666.67 ml  Output    350 ml  Net  316.67 ml    Exam: Gen:  NAD Cardiovascular:  RRR, No M/R/G Respiratory:  Lungs CTAB Gastrointestinal:  Abdomen soft, tender mid epigastrium, + BS Extremities:  No C/E/C   Data Reviewed:    Labs: Basic Metabolic Panel:  Recent Labs Lab 09/03/13 2223 09/08/13 1531 09/08/13 1553 09/09/13 0520  NA 138 139 140 138  K 4.0 3.5* 3.7 3.7  CL 100 102 102 103  CO2 24 24  --  23  GLUCOSE 116* 103* 105* 114*  BUN 23 23 21 14   CREATININE 0.97 0.90 0.90 0.90  CALCIUM 9.7 8.9  --  8.6   GFR Estimated Creatinine Clearance: 102.7 ml/min (by C-G formula based on Cr of 0.9). Liver Function Tests:  Recent Labs Lab 09/03/13 2223 09/08/13 1531  AST 17 13  ALT 21 12  ALKPHOS 76 51  BILITOT 0.3 0.5  PROT 7.9 7.1  ALBUMIN 3.8 3.2*    Recent Labs Lab 09/03/13 2223 09/08/13 1531 09/09/13 0520  LIPASE 92* 114* 53   CBC:  Recent Labs Lab 09/03/13 2223 09/08/13 1531 09/08/13 1553 09/09/13 0520  WBC 7.8 9.8  --  10.3  NEUTROABS 6.1 7.7  --   --   HGB 14.6 13.0 13.9 12.9*  HCT 43.3 38.9* 41.0 38.4*  MCV 90.8 90.7  --  90.4  PLT 169 145*  --  125*   Microbiology No results found for this or any previous visit (from the  past 240 hour(s)).   Radiographs/Studies:   Ct Abdomen Pelvis W Contrast  09/08/2013   CLINICAL DATA:  Patient diagnosed with pancreatitis on September 04, 2013. Now has worsened abdomen pain.  EXAM: CT ABDOMEN AND PELVIS WITH CONTRAST  TECHNIQUE: Multidetector CT imaging of the abdomen and pelvis was performed using the standard protocol following bolus administration of intravenous contrast.  CONTRAST:  189mL OMNIPAQUE IOHEXOL 300 MG/ML  SOLN  COMPARISON:  CT abdomen and pelvis April 09, 2005  FINDINGS: There is edema with multiloculated cystic structure in the pancreatic head and peripancreatic stranding consistent with patient's known pancreatitis with pancreatic pseudocyst measuring 2.7 x 4.8 cm. There is chronic pancreatic duct dilatation with changes of  chronic pancreatitis.  There is mild diffuse fatty infiltration of liver. The gallbladder, spleen, adrenal glands are normal. There are cysts in bilateral kidneys, largest in the lower pole left kidney measuring 3.2 x 3.4 cm. There is no hydronephrosis bilaterally. There is atherosclerosis of the abdominal aorta without aneurysmal dilatation. There is no abdominal lymphadenopathy. There is no small bowel obstruction or diverticulitis. The appendix is not seen but no inflammation is noted around the cecum.  Fluid-filled bladder is normal. There is no pelvic lymphadenopathy. No acute abnormality is identified within the visualized bones. There is atelectasis of the bilateral lung bases.  IMPRESSION: Acute superimposed on chronic pancreatitis. 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   Electronically Signed   By: Abelardo Diesel M.D.   On: 09/08/2013 19:11   Dg Abd Acute W/chest  09/08/2013   CLINICAL DATA:  Pain and fever.  EXAM: ACUTE ABDOMEN SERIES (ABDOMEN 2 VIEW & CHEST 1 VIEW)  COMPARISON:  Abdominal series 09/03/2013 .  FINDINGS: Mediastinum and hilar structures are normal. Mild basilar atelectasis. No pleural effusion or pneumothorax. Heart size normal.  Soft tissues of the abdomen are unremarkable. What appears to be a pancreatic duct stent is in stable position. Calcifications noted throughout the pancreas consistent with chronic pancreatitis. Large amount of stool in colon consistent with constipation. No bowel distention or free air.  IMPRESSION: 1. Constipation. 2. Pancreatic duct stent noted in stable position. Pancreatic calcifications consistent with chronic pancreatitis. 3. Bibasilar subsegmental atelectasis.   Electronically Signed   By: Marcello Moores  Register   On: 09/08/2013 17:05     Medications:   . heparin  5,000 Units Subcutaneous 3 times per day   Continuous Infusions: . dextrose 5 % and 0.45 % NaCl with KCl 20 mEq/L 100 mL/hr at 09/09/13 0728    Time spent: 35 minutes with  > 50% of time discussing current diagnostic test results, clinical impression and plan of care.    LOS: 1 day   Lawrence Brock  [facility] Pager [phone]. If unable to reach me by pager, please call my cell phone at [phone].  *Please refer to [email or web address], password [email or web address] to get updated schedule on who will round on this patient, as hospitalists switch teams weekly. If 7PM-7AM, please contact night-coverage at [email or web address], password [email or web address] for any overnight needs.  09/09/2013, 7:59 AM    **Disclaimer: This note was dictated with voice recognition software. Similar sounding words can inadvertently be transcribed and this note may contain transcription errors which may not have been corrected upon publication of note.**

## 2013-09-10 ENCOUNTER — Encounter (HOSPITAL_COMMUNITY): Payer: Self-pay | Admitting: Internal Medicine

## 2013-09-10 DIAGNOSIS — K862 Cyst of pancreas: Secondary | ICD-10-CM

## 2013-09-10 DIAGNOSIS — K863 Pseudocyst of pancreas: Secondary | ICD-10-CM

## 2013-09-10 DIAGNOSIS — R509 Fever, unspecified: Secondary | ICD-10-CM | POA: Diagnosis present

## 2013-09-10 DIAGNOSIS — K861 Other chronic pancreatitis: Secondary | ICD-10-CM | POA: Diagnosis present

## 2013-09-10 DIAGNOSIS — K859 Acute pancreatitis without necrosis or infection, unspecified: Principal | ICD-10-CM

## 2013-09-10 DIAGNOSIS — D696 Thrombocytopenia, unspecified: Secondary | ICD-10-CM

## 2013-09-10 HISTORY — DX: Other chronic pancreatitis: K86.1

## 2013-09-10 MED ORDER — MORPHINE SULFATE (PF) 1 MG/ML IV SOLN
INTRAVENOUS | Status: DC
  Administered 2013-09-10: 10 mg via INTRAVENOUS
  Administered 2013-09-10 – 2013-09-11 (×2): via INTRAVENOUS
  Administered 2013-09-11 (×2): 5 mg via INTRAVENOUS
  Administered 2013-09-11: 8 mg via INTRAVENOUS
  Administered 2013-09-11: 13 mg via INTRAVENOUS
  Administered 2013-09-12: 6 mg via INTRAVENOUS
  Administered 2013-09-12: 7 mg via INTRAVENOUS
  Administered 2013-09-12: 10 mg via INTRAVENOUS
  Administered 2013-09-12: 2 mg via INTRAVENOUS
  Administered 2013-09-12 – 2013-09-13 (×3): 1 mg via INTRAVENOUS
  Administered 2013-09-13: 4 mg via INTRAVENOUS
  Filled 2013-09-10 (×4): qty 25

## 2013-09-10 MED ORDER — SODIUM CHLORIDE 0.9 % IJ SOLN: 9.0000 mL | INTRAMUSCULAR | Status: DC | PRN

## 2013-09-10 MED ORDER — IMIPENEM-CILASTATIN 500 MG IV SOLR
500.0000 mg | Freq: Three times a day (TID) | INTRAVENOUS | Status: DC
  Administered 2013-09-10 – 2013-09-15 (×15): 500 mg via INTRAVENOUS
  Filled 2013-09-10 (×16): qty 500

## 2013-09-10 MED ORDER — DIPHENHYDRAMINE HCL 50 MG/ML IJ SOLN
12.5000 mg | Freq: Four times a day (QID) | INTRAMUSCULAR | Status: DC | PRN
Start: 2013-09-10 — End: 2013-09-13

## 2013-09-10 MED ORDER — NALOXONE HCL 0.4 MG/ML IJ SOLN
0.4000 mg | INTRAMUSCULAR | Status: DC | PRN
Start: 2013-09-10 — End: 2013-09-13

## 2013-09-10 MED ORDER — ALUM & MAG HYDROXIDE-SIMETH 200-200-20 MG/5ML PO SUSP
30.0000 mL | Freq: Four times a day (QID) | ORAL | Status: DC | PRN
  Administered 2013-09-10 – 2013-09-13 (×3): 30 mL via ORAL
  Filled 2013-09-10 (×3): qty 30

## 2013-09-10 MED ORDER — DIPHENHYDRAMINE HCL 12.5 MG/5ML PO ELIX: 12.5000 mg | ORAL_SOLUTION | Freq: Four times a day (QID) | ORAL | Status: DC | PRN

## 2013-09-10 MED ORDER — PIPERACILLIN-TAZOBACTAM 3.375 G IVPB
3.3750 g | Freq: Three times a day (TID) | INTRAVENOUS | Status: DC
  Administered 2013-09-10: 3.375 g via INTRAVENOUS
  Filled 2013-09-10: qty 50

## 2013-09-10 NOTE — Consult Note (Signed)
Referring Provider: No ref. provider found Primary Care Physician:  No primary provider on file. Primary Gastroenterologist:  Dr. [name]  Reason for Consultation:  Pancreatitis; pseudocyst; fever  HPI: Lawrence Brock is a 43 y.o. Filipino male with history of chronic pancreatitis.  First episode/issues with this was in 2007.  Secondary to ETOH.  Patient states he has had abdominal discomfort for the last 2 weeks. But for 5 days prior to admission the abdominal discomfort has been persistent and getting worse.  He still drinks ETOH with brandy and beer on the weekends.  Recently had a few drinks of a margarita, and shortly after that is when the pain significantly worsened.  Pain is mostly in his upper abdomen and radiates to his back.  Pain is improved with pain medication, but he is still definitely requiring that.  While in the ED patient was found to have elevated lipase level at 114 and CT findings consistent with acute on chronic pancreatitis with a  2.7 x 4.8 cm pancreatic head pseudocyst.  No leukocystosis.  Patient's lipase has returned to normal, but he continues to have fevers up to 103.2.  He is just being placed on Zosyn today, but GI has been consulted regarding his fevers and the need for antibiotics.  He has a pancreatic duct stent in place that was apparently placed in 2007, although I cannot currently find documentation of the procedure where it was placed.   Past Medical History  Diagnosis Date  . Pancreatitis   . Chronic back pain     History reviewed. No pertinent past surgical history.  Prior to Admission medications   Medication Sig Start Date End Date Taking? Authorizing Provider  HYDROcodone-ibuprofen (VICOPROFEN) 7.5-200 MG per tablet Take 1 tablet by mouth every 8 (eight) hours as needed for moderate pain.   Yes Historical Provider, MD  polyethylene glycol (MIRALAX / GLYCOLAX) packet Take 17 g by mouth daily. 09/04/13  Yes Hannah Muthersbaugh, PA-C    Current  Facility-Administered Medications  Medication Dose Route Frequency Provider Last Rate Last Dose  . acetaminophen (TYLENOL) tablet 650 mg  650 mg Oral Q4H PRN Venetia Maxon [name], MD   650 mg at 09/10/13 1045  . dextrose 5 % and 0.45 % NaCl with KCl 20 mEq/L infusion   Intravenous Continuous Velvet Bathe, MD 100 mL/hr at 09/09/13 1643    . diphenhydrAMINE (BENADRYL) injection 12.5 mg  12.5 mg Intravenous Q6H PRN [name] P [name], MD       Or  . diphenhydrAMINE (BENADRYL) 12.5 MG/5ML elixir 12.5 mg  12.5 mg Oral Q6H PRN [name] P [name], MD      . docusate sodium (COLACE) capsule 100 mg  100 mg Oral BID Venetia Maxon [name], MD   100 mg at 09/10/13 1036  . heparin injection 5,000 Units  5,000 Units Subcutaneous 3 times per day Velvet Bathe, MD   5,000 Units at 09/10/13 4235  . morphine 1 MG/ML PCA injection   Intravenous 6 times per day Venetia Maxon [name], MD      . naloxone [facility]) injection 0.4 mg  0.4 mg Intravenous PRN Venetia Maxon [name], MD       And  . sodium chloride 0.9 % injection 9 mL  9 mL Intravenous PRN Venetia Maxon [name], MD      . ondansetron (ZOFRAN) tablet 4 mg  4 mg Oral Q6H PRN Velvet Bathe, MD       Or  . ondansetron (ZOFRAN) injection 4 mg  4 mg  Intravenous Q6H PRN Velvet Bathe, MD      . polyethylene glycol (MIRALAX / GLYCOLAX) packet 17 g  17 g Oral Daily Venetia Maxon [name], MD   17 g at 09/10/13 1036    Allergies as of 09/08/2013  . (No Known Allergies)    History reviewed. No pertinent family history.  History   Social History  . Marital Status: Married    Spouse Name: N/A    Number of Children: N/A  . Years of Education: N/A   Occupational History  . Not on file.   Social History Main Topics  . Smoking status: Current Every Day Smoker -- 0.50 packs/day  . Smokeless tobacco: Not on file  . Alcohol Use: Yes  . Drug Use: No   Review of Systems: Ten point ROS is O/W negative except as mentioned in HPI.  Physical Exam: Vital signs in last 24 hours: Temp:  [98.3 F  (36.8 C)-103.2 F (39.6 C)] 102.7 F (39.3 C) (06/19 1045) Pulse Rate:  [71-78] 78 (06/19 0640) Resp:  [16-20] 18 (06/19 0640) BP: (118-135)/(73-90) 125/80 mmHg (06/19 0640) SpO2:  [94 %-96 %] 96 % (06/19 0640) Last BM Date: 09/08/13 General:  Alert, Well-developed, well-nourished, pleasant and cooperative in NAD Head:  Normocephalic and atraumatic. Eyes:  Sclera clear, no icterus.  Conjunctiva pink. Ears:  Normal auditory acuity. Mouth:  No deformity or lesions.   Lungs:  Clear throughout to auscultation.   No wheezes, crackles, or rhonchi.  Heart:  Regular rate and rhythm; no murmurs, clicks, rubs, or gallops. Abdomen:  Soft, non-distended.  BS present.  Diffuse TTP > in the epigastrium.   Rectal:  Deferred  Msk:  Symmetrical without gross deformities. Pulses:  Normal pulses noted. Extremities:  Without clubbing or edema. Neurologic:  Alert and  oriented x4;  grossly normal neurologically. Skin:  Intact without significant lesions or rashes. Psych:  Alert and cooperative. Normal mood and affect.  Intake/Output from previous day: 06/18 0701 - 06/19 0700 In: 2500 [I.V.:2500] Out: -   Lab Results:  Recent Labs  09/08/13 1531 09/08/13 1553 09/09/13 0520  WBC 9.8  --  10.3  HGB 13.0 13.9 12.9*  HCT 38.9* 41.0 38.4*  PLT 145*  --  125*   BMET  Recent Labs  09/08/13 1531 09/08/13 1553 09/09/13 0520  NA 139 140 138  K 3.5* 3.7 3.7  CL 102 102 103  CO2 24  --  23  GLUCOSE 103* 105* 114*  BUN 23 21 14   CREATININE 0.90 0.90 0.90  CALCIUM 8.9  --  8.6   LFT  Recent Labs  09/08/13 1531  PROT 7.1  ALBUMIN 3.2*  AST 13  ALT 12  ALKPHOS 51  BILITOT 0.5    Lab Results  Component Value Date   LIPASE 53 09/09/2013    Studies/Results: Ct Abdomen Pelvis W Contrast  09/08/2013   CLINICAL DATA:  Patient diagnosed with pancreatitis on September 04, 2013. Now has worsened abdomen pain.  EXAM: CT ABDOMEN AND PELVIS WITH CONTRAST  TECHNIQUE: Multidetector CT imaging of  the abdomen and pelvis was performed using the standard protocol following bolus administration of intravenous contrast.  CONTRAST:  17mL OMNIPAQUE IOHEXOL 300 MG/ML  SOLN  COMPARISON:  CT abdomen and pelvis April 09, 2005  FINDINGS: There is edema with multiloculated cystic structure in the pancreatic head and peripancreatic stranding consistent with patient's known pancreatitis with pancreatic pseudocyst measuring 2.7 x 4.8 cm. There is chronic pancreatic duct dilatation with changes of  chronic pancreatitis.  There is mild diffuse fatty infiltration of liver. The gallbladder, spleen, adrenal glands are normal. There are cysts in bilateral kidneys, largest in the lower pole left kidney measuring 3.2 x 3.4 cm. There is no hydronephrosis bilaterally. There is atherosclerosis of the abdominal aorta without aneurysmal dilatation. There is no abdominal lymphadenopathy. There is no small bowel obstruction or diverticulitis. The appendix is not seen but no inflammation is noted around the cecum.  Fluid-filled bladder is normal. There is no pelvic lymphadenopathy. No acute abnormality is identified within the visualized bones. There is atelectasis of the bilateral lung bases.  IMPRESSION: Acute superimposed on chronic pancreatitis. 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   Electronically Signed   By: Abelardo Diesel M.D.   On: 09/08/2013 19:11   Dg Abd Acute W/chest  09/08/2013   CLINICAL DATA:  Pain and fever.  EXAM: ACUTE ABDOMEN SERIES (ABDOMEN 2 VIEW & CHEST 1 VIEW)  COMPARISON:  Abdominal series 09/03/2013 .  FINDINGS: Mediastinum and hilar structures are normal. Mild basilar atelectasis. No pleural effusion or pneumothorax. Heart size normal.  Soft tissues of the abdomen are unremarkable. What appears to be a pancreatic duct stent is in stable position. Calcifications noted throughout the pancreas consistent with chronic pancreatitis. Large amount of stool in colon consistent with constipation. No  bowel distention or free air.  IMPRESSION: 1. Constipation. 2. Pancreatic duct stent noted in stable position. Pancreatic calcifications consistent with chronic pancreatitis. 3. Bibasilar subsegmental atelectasis.   Electronically Signed   By: Marcello Moores  Register   On: 09/08/2013 17:05    IMPRESSION:  -Acute on chronic pancreatitis with pseudocyst in the pancreatic head:  Secondary to ETOH.  Having fevers.  ? Infected pseudocyst.  No leukocytosis.  Zosyn just ordered today.  PLAN: -Will discuss with Dr. Carlean Purl regarding need for ongoing antibiotics or any drainage/sampling of pseudocyst fluid. -Otherwise supportive care with NPO status, IVF's, pain control, anti-emetics, etc. -Likely should be placed on pancreatic enzymes when he returns to eating.   ZEHR, JESSICA D.  09/10/2013, 10:57 AM  Pager number [facility] Attending  I have also seen and assessed the patient and agree with the above note.  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  Fever could be from pancreatitis or could be infected fluid - he does not have pancreatic necrosis and NL lipase so if anything more likely from fluid I suspect.  Use of Abx not clear here but reasonable for a while - however am changing to Imipenem as that penetrates pancreas vs. Zosyn  Will consult surgery for their input. He needs to stop EtOH obviously. Short-term ? Is what is next beyond conservative care. Clear liquids probably ok - will see what surgery thinks.  Options before [place] are CT guided aspiration of cyst, EUS aspiration (need to discuss w/ Dr. [name] next week),  May need surgical procedure eventually anyway for more definitive Tx. A pancreatic stent could help also - though whether we would do that here vs tertiary center [place] the  [place].  Await surgery opinion.  Gatha Mayer, MD, [facility] [phone] (pager) 09/10/2013 2:18 PM

## 2013-09-10 NOTE — Progress Notes (Signed)
Progress Note   [name]:[id number] DOB: 08-26-1970 DOA: 09/08/2013 PCP: No primary provider on file.   Brief Narrative:   Lawrence Brock is an 43 y.o. male with a PMH of chronic pancreatitis who was admitted 09/08/13 chief complaint of a two-week history of progressively worsening abdominal discomfort. Upon initial evaluation in the ED, the patient had elevated lipase levels and CT findings consistent with acute pancreatitis.  Assessment/Plan:   Principal Problem: Acute pancreatitis/pancreatic pseudocyst  Likely triggered by alcohol use.  Triglycerides not elevated.  Continue conservative care with bowel rest, anti-nausea medication, and pain medication.  Will use PCA for better pain relief.  Lipase now within normal limits.  Given fever, worry about infected pseudocyst.  Will ask for GI +/- surgery input.  Active Problems: Fever  The patient spiked a fever 09/09/13.  Followup 2 sets of blood cultures.  Continue Tylenol when necessary.  Started empiric Zosyn 09/10/13.  Hypokalemia  Resolved with potassium added to IV fluids.  Thrombocytopenia, unspecified/Normocytic anemia  Suspect related to bone marrow suppression from alcohol use but may be reflective of chronic inflammation.  Unspecified constipation  Continue stool softener and MiraLAX.  DVT Prophylaxis  Continue heparin.  Code Status: Full. Family Communication: Wife updated at bedside. Disposition Plan: Home when stable.   IV Access:    Peripheral IV   Procedures:    None.   Medical Consultants:    GI   Other Consultants:    None.   Anti-Infectives:    Zosyn 09/10/13--->  Subjective:   Lawrence Brock complains of ongoing sharp, stabbing pain in the mid epigastric area, pain medicine only keeping him comfortable for about an hour.  No nausea or vomiting.  No black or bloody stools.  Objective:    Filed Vitals:   09/09/13 1841 09/09/13 2215 09/10/13 0247  09/10/13 0640  BP:  135/90  125/80  Pulse:  75  78  Temp: 100.9 F (38.3 C) 100.2 F (37.9 C) 98.3 F (36.8 C) 99.4 F (37.4 C)  TempSrc: Oral Oral Oral Oral  Resp:  20  18  Height:      Weight:      SpO2:  96%  96%    Intake/Output Summary (Last 24 hours) at 09/10/13 0909 Last data filed at 09/10/13 0600  Gross per 24 hour  Intake   2500 ml  Output      0 ml  Net   2500 ml    Exam: Gen:  NAD Cardiovascular:  RRR, No M/R/G Respiratory:  Lungs CTAB Gastrointestinal:  Abdomen slightly firm, tender mid epigastrium, + BS Extremities:  No C/E/C   Data Reviewed:    Labs: Basic Metabolic Panel:  Recent Labs Lab 09/03/13 2223 09/08/13 1531 09/08/13 1553 09/09/13 0520  NA 138 139 140 138  K 4.0 3.5* 3.7 3.7  CL 100 102 102 103  CO2 24 24  --  23  GLUCOSE 116* 103* 105* 114*  BUN 23 23 21 14   CREATININE 0.97 0.90 0.90 0.90  CALCIUM 9.7 8.9  --  8.6   GFR Estimated Creatinine Clearance: 102.7 ml/min (by C-G formula based on Cr of 0.9). Liver Function Tests:  Recent Labs Lab 09/03/13 2223 09/08/13 1531  AST 17 13  ALT 21 12  ALKPHOS 76 51  BILITOT 0.3 0.5  PROT 7.9 7.1  ALBUMIN 3.8 3.2*    Recent Labs Lab 09/03/13 2223 09/08/13 1531 09/09/13 0520  LIPASE 92* 114* 53   CBC:  Recent Labs  Lab 09/03/13 2223 09/08/13 1531 09/08/13 1553 09/09/13 0520  WBC 7.8 9.8  --  10.3  NEUTROABS 6.1 7.7  --   --   HGB 14.6 13.0 13.9 12.9*  HCT 43.3 38.9* 41.0 38.4*  MCV 90.8 90.7  --  90.4  PLT 169 145*  --  125*   Microbiology No results found for this or any previous visit (from the past 240 hour(s)).   Radiographs/Studies:   Ct Abdomen Pelvis W Contrast  09/08/2013   CLINICAL DATA:  Patient diagnosed with pancreatitis on September 04, 2013. Now has worsened abdomen pain.  EXAM: CT ABDOMEN AND PELVIS WITH CONTRAST  TECHNIQUE: Multidetector CT imaging of the abdomen and pelvis was performed using the standard protocol following bolus administration of  intravenous contrast.  CONTRAST:  132mL OMNIPAQUE IOHEXOL 300 MG/ML  SOLN  COMPARISON:  CT abdomen and pelvis April 09, 2005  FINDINGS: There is edema with multiloculated cystic structure in the pancreatic head and peripancreatic stranding consistent with patient's known pancreatitis with pancreatic pseudocyst measuring 2.7 x 4.8 cm. There is chronic pancreatic duct dilatation with changes of chronic pancreatitis.  There is mild diffuse fatty infiltration of liver. The gallbladder, spleen, adrenal glands are normal. There are cysts in bilateral kidneys, largest in the lower pole left kidney measuring 3.2 x 3.4 cm. There is no hydronephrosis bilaterally. There is atherosclerosis of the abdominal aorta without aneurysmal dilatation. There is no abdominal lymphadenopathy. There is no small bowel obstruction or diverticulitis. The appendix is not seen but no inflammation is noted around the cecum.  Fluid-filled bladder is normal. There is no pelvic lymphadenopathy. No acute abnormality is identified within the visualized bones. There is atelectasis of the bilateral lung bases.  IMPRESSION: Acute superimposed on chronic pancreatitis. 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   Electronically Signed   By: Abelardo Diesel M.D.   On: 09/08/2013 19:11   Dg Abd Acute W/chest  09/08/2013   CLINICAL DATA:  Pain and fever.  EXAM: ACUTE ABDOMEN SERIES (ABDOMEN 2 VIEW & CHEST 1 VIEW)  COMPARISON:  Abdominal series 09/03/2013 .  FINDINGS: Mediastinum and hilar structures are normal. Mild basilar atelectasis. No pleural effusion or pneumothorax. Heart size normal.  Soft tissues of the abdomen are unremarkable. What appears to be a pancreatic duct stent is in stable position. Calcifications noted throughout the pancreas consistent with chronic pancreatitis. Large amount of stool in colon consistent with constipation. No bowel distention or free air.  IMPRESSION: 1. Constipation. 2. Pancreatic duct stent noted in  stable position. Pancreatic calcifications consistent with chronic pancreatitis. 3. Bibasilar subsegmental atelectasis.   Electronically Signed   By: Marcello Moores  Register   On: 09/08/2013 17:05     Medications:   . docusate sodium  100 mg Oral BID  . heparin  5,000 Units Subcutaneous 3 times per day  . polyethylene glycol  17 g Oral Daily   Continuous Infusions: . dextrose 5 % and 0.45 % NaCl with KCl 20 mEq/L 100 mL/hr at 09/09/13 1643    Time spent: 35 minutes with > 50% of time discussing current diagnostic test results, clinical impression and plan of care.    LOS: 2 days   [name],[name]  [facility] Pager [phone]. If unable to reach me by pager, please call my cell phone at [phone].  *Please refer to [email or web address], password [email or web address] to get updated schedule on who will round on this patient, as hospitalists switch teams weekly. If 7PM-7AM, please contact night-coverage at [email or web address], password  [email or web address] for any overnight needs.  09/10/2013, 9:09 AM    **Disclaimer: This note was dictated with voice recognition software. Similar sounding words can inadvertently be transcribed and this note may contain transcription errors which may not have been corrected upon publication of note.**

## 2013-09-10 NOTE — Consult Note (Signed)
Lawrence Brock 1970/11/28  [id number].   Requesting MD: Dr. Silvano Rusk Chief Complaint/Reason for Consult: pancreatitis with pancreatic pseudocyst HPI: This is a 43 yo [place] male with a history of ETOH induced pancreatitis in 2007.  He thinks he was sent to [facility] and had a pancreatic stent placed.  He has never had follow up for this.  He continues to drink beer, brandy, and occasionally other liquors.  He began having abdominal pain about 2 weeks ago per his friend in the room.  He came to the ED last week and was diagnosed with pancreatitis and sent home with pain meds.  Apparently, his pain did not respond to these meds and he returned a couple of days ago.  He was admitted.  He got a CT scan that revealed pancreatitis with a pancreatic pseudocyst in the head of the pancreas and chronic pancreatic duct dilatation.  He has started running fevers of 102-103.  GI was asked to see him today.  They asked use to evaluate the patient for further recommendations.  ROS: Please see HPI, otherwise all other systems are negative.  History reviewed. No pertinent family history.  Past Medical History  Diagnosis Date  . Pancreatitis   . Chronic back pain   . Chronic pancreatitis 09/10/2013    History reviewed. No pertinent past surgical history.  Social History:  reports that he has been smoking.  He does not have any smokeless tobacco history on file. He reports that he drinks alcohol. He reports that he does not use illicit drugs.  Allergies: No Known Allergies  Medications Prior to Admission  Medication Sig Dispense Refill  . HYDROcodone-ibuprofen (VICOPROFEN) 7.5-200 MG per tablet Take 1 tablet by mouth every 8 (eight) hours as needed for moderate pain.      . polyethylene glycol (MIRALAX / GLYCOLAX) packet Take 17 g by mouth daily.  14 each  0    Blood pressure 135/83, pulse 65, temperature 98.8 F (37.1 C), temperature source Oral, resp. rate 18, height 5' 8.5" (1.74 m), weight 149 lb  11.2 oz (67.903 kg), SpO2 96.00%. Physical Exam: General: pleasant, WD, WN Filipino male who is laying in bed in NAD HEENT: head is normocephalic, atraumatic.  Sclera are noninjected.  PERRL.  Ears and nose without any masses or lesions.  Mouth is pink and moist Heart: regular, rate, and rhythm.  Normal s1,s2. No obvious murmurs, gallops, or rubs noted.  Palpable radial and pedal pulses bilaterally Lungs: CTAB, no wheezes, rhonchi, or rales noted.  Respiratory effort nonlabored Abd: soft, mildly tender in the LUQ, ND, +BS, no masses, hernias, or organomegaly MS: all 4 extremities are symmetrical with no cyanosis, clubbing, or edema. Skin: warm and dry with no masses, lesions, or rashes Psych: A&Ox3 with an appropriate affect.    Results for orders placed during the hospital encounter of 09/08/13 (from the past 48 hour(s))  CBC WITH DIFFERENTIAL     Status: Abnormal   Collection Time    09/08/13  3:31 PM      Result Value Ref Range   WBC 9.8  4.0 - 10.5 K/uL   RBC 4.29  4.22 - 5.81 MIL/uL   Hemoglobin 13.0  13.0 - 17.0 g/dL   HCT 38.9 (*) 39.0 - 52.0 %   MCV 90.7  78.0 - 100.0 fL   MCH 30.3  26.0 - 34.0 pg   MCHC 33.4  30.0 - 36.0 g/dL   RDW 11.9  11.5 - 15.5 %   Platelets 145 (*)  150 - 400 K/uL   Neutrophils Relative % 79 (*) 43 - 77 %   Neutro Abs 7.7  1.7 - 7.7 K/uL   Lymphocytes Relative 12  12 - 46 %   Lymphs Abs 1.2  0.7 - 4.0 K/uL   Monocytes Relative 8  3 - 12 %   Monocytes Absolute 0.8  0.1 - 1.0 K/uL   Eosinophils Relative 1  0 - 5 %   Eosinophils Absolute 0.1  0.0 - 0.7 K/uL   Basophils Relative 0  0 - 1 %   Basophils Absolute 0.0  0.0 - 0.1 K/uL  COMPREHENSIVE METABOLIC PANEL     Status: Abnormal   Collection Time    09/08/13  3:31 PM      Result Value Ref Range   Sodium 139  137 - 147 mEq/L   Potassium 3.5 (*) 3.7 - 5.3 mEq/L   Chloride 102  96 - 112 mEq/L   CO2 24  19 - 32 mEq/L   Glucose, Bld 103 (*) 70 - 99 mg/dL   BUN 23  6 - 23 mg/dL   Creatinine, Ser  0.90  0.50 - 1.35 mg/dL   Calcium 8.9  8.4 - 10.5 mg/dL   Total Protein 7.1  6.0 - 8.3 g/dL   Albumin 3.2 (*) 3.5 - 5.2 g/dL   AST 13  0 - 37 U/L   ALT 12  0 - 53 U/L   Alkaline Phosphatase 51  39 - 117 U/L   Total Bilirubin 0.5  0.3 - 1.2 mg/dL   GFR calc non Af Amer >90  >90 mL/min   GFR calc Af Amer >90  >90 mL/min   Comment: (NOTE)     The eGFR has been calculated using the CKD EPI equation.     This calculation has not been validated in all clinical situations.     eGFR's persistently <90 mL/min signify possible Chronic Kidney     Disease.  LIPASE, BLOOD     Status: Abnormal   Collection Time    09/08/13  3:31 PM      Result Value Ref Range   Lipase 114 (*) 11 - 59 U/L  LACTATE DEHYDROGENASE     Status: None   Collection Time    09/08/13  3:43 PM      Result Value Ref Range   LDH 153  94 - 250 U/L  URINALYSIS, ROUTINE W REFLEX MICROSCOPIC     Status: None   Collection Time    09/08/13  3:47 PM      Result Value Ref Range   Color, Urine YELLOW  YELLOW   APPearance CLEAR  CLEAR   Specific Gravity, Urine 1.020  1.005 - 1.030   pH 5.5  5.0 - 8.0   Glucose, UA NEGATIVE  NEGATIVE mg/dL   Hgb urine dipstick NEGATIVE  NEGATIVE   Bilirubin Urine NEGATIVE  NEGATIVE   Ketones, ur NEGATIVE  NEGATIVE mg/dL   Protein, ur NEGATIVE  NEGATIVE mg/dL   Urobilinogen, UA 1.0  0.0 - 1.0 mg/dL   Nitrite NEGATIVE  NEGATIVE   Leukocytes, UA NEGATIVE  NEGATIVE   Comment: MICROSCOPIC NOT DONE ON URINES WITH NEGATIVE PROTEIN, BLOOD, LEUKOCYTES, NITRITE, OR GLUCOSE <1000 mg/dL.  I-STAT CHEM 8, ED     Status: Abnormal   Collection Time    09/08/13  3:53 PM      Result Value Ref Range   Sodium 140  137 - 147 mEq/L   Potassium 3.7  3.7 - 5.3 mEq/L   Chloride 102  96 - 112 mEq/L   BUN 21  6 - 23 mg/dL   Creatinine, Ser 0.90  0.50 - 1.35 mg/dL   Glucose, Bld 105 (*) 70 - 99 mg/dL   Calcium, Ion 1.18  1.12 - 1.23 mmol/L   TCO2 23  0 - 100 mmol/L   Hemoglobin 13.9  13.0 - 17.0 g/dL   HCT 41.0   39.0 - 52.0 %  LIPID PANEL     Status: Abnormal   Collection Time    09/09/13  5:20 AM      Result Value Ref Range   Cholesterol 93  0 - 200 mg/dL   Triglycerides 45  <150 mg/dL   HDL 34 (*) >39 mg/dL   Total CHOL/HDL Ratio 2.7     VLDL 9  0 - 40 mg/dL   LDL Cholesterol 50  0 - 99 mg/dL   Comment:            Total Cholesterol/HDL:CHD Risk     Coronary Heart Disease Risk Table                         Men   Women      1/2 Average Risk   3.4   3.3      Average Risk       5.0   4.4      2 X Average Risk   9.6   7.1      3 X Average Risk  23.4   11.0                Use the calculated Patient Ratio     above and the CHD Risk Table     to determine the patient's CHD Risk.                ATP III CLASSIFICATION (LDL):      <100     mg/dL   Optimal      100-129  mg/dL   Near or Above                        Optimal      130-159  mg/dL   Borderline      160-189  mg/dL   High      >190     mg/dL   Very High     Performed at [facility], BLOOD     Status: None   Collection Time    09/09/13  5:20 AM      Result Value Ref Range   Lipase 53  11 - 59 U/L  BASIC METABOLIC PANEL     Status: Abnormal   Collection Time    09/09/13  5:20 AM      Result Value Ref Range   Sodium 138  137 - 147 mEq/L   Potassium 3.7  3.7 - 5.3 mEq/L   Chloride 103  96 - 112 mEq/L   CO2 23  19 - 32 mEq/L   Glucose, Bld 114 (*) 70 - 99 mg/dL   BUN 14  6 - 23 mg/dL   Creatinine, Ser 0.90  0.50 - 1.35 mg/dL   Calcium 8.6  8.4 - 10.5 mg/dL   GFR calc non Af Amer >90  >90 mL/min   GFR calc Af Amer >90  >90 mL/min   Comment: (NOTE)  The eGFR has been calculated using the CKD EPI equation.     This calculation has not been validated in all clinical situations.     eGFR's persistently <90 mL/min signify possible Chronic Kidney     Disease.  CBC     Status: Abnormal   Collection Time    09/09/13  5:20 AM      Result Value Ref Range   WBC 10.3  4.0 - 10.5 K/uL   RBC 4.25  4.22 - 5.81  MIL/uL   Hemoglobin 12.9 (*) 13.0 - 17.0 g/dL   HCT 38.4 (*) 39.0 - 52.0 %   MCV 90.4  78.0 - 100.0 fL   MCH 30.4  26.0 - 34.0 pg   MCHC 33.6  30.0 - 36.0 g/dL   RDW 11.8  11.5 - 15.5 %   Platelets 125 (*) 150 - 400 K/uL   Ct Abdomen Pelvis W Contrast  09/08/2013   CLINICAL DATA:  Patient diagnosed with pancreatitis on September 04, 2013. Now has worsened abdomen pain.  EXAM: CT ABDOMEN AND PELVIS WITH CONTRAST  TECHNIQUE: Multidetector CT imaging of the abdomen and pelvis was performed using the standard protocol following bolus administration of intravenous contrast.  CONTRAST:  172m OMNIPAQUE IOHEXOL 300 MG/ML  SOLN  COMPARISON:  CT abdomen and pelvis April 09, 2005  FINDINGS: There is edema with multiloculated cystic structure in the pancreatic head and peripancreatic stranding consistent with patient's known pancreatitis with pancreatic pseudocyst measuring 2.7 x 4.8 cm. There is chronic pancreatic duct dilatation with changes of chronic pancreatitis.  There is mild diffuse fatty infiltration of liver. The gallbladder, spleen, adrenal glands are normal. There are cysts in bilateral kidneys, largest in the lower pole left kidney measuring 3.2 x 3.4 cm. There is no hydronephrosis bilaterally. There is atherosclerosis of the abdominal aorta without aneurysmal dilatation. There is no abdominal lymphadenopathy. There is no small bowel obstruction or diverticulitis. The appendix is not seen but no inflammation is noted around the cecum.  Fluid-filled bladder is normal. There is no pelvic lymphadenopathy. No acute abnormality is identified within the visualized bones. There is atelectasis of the bilateral lung bases.  IMPRESSION: Acute superimposed on chronic pancreatitis. 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   Electronically Signed   By: WAbelardo DieselM.D.   On: 09/08/2013 19:11   Dg Abd Acute W/chest  09/08/2013   CLINICAL DATA:  Pain and fever.  EXAM: ACUTE ABDOMEN SERIES (ABDOMEN 2  VIEW & CHEST 1 VIEW)  COMPARISON:  Abdominal series 09/03/2013 .  FINDINGS: Mediastinum and hilar structures are normal. Mild basilar atelectasis. No pleural effusion or pneumothorax. Heart size normal.  Soft tissues of the abdomen are unremarkable. What appears to be a pancreatic duct stent is in stable position. Calcifications noted throughout the pancreas consistent with chronic pancreatitis. Large amount of stool in colon consistent with constipation. No bowel distention or free air.  IMPRESSION: 1. Constipation. 2. Pancreatic duct stent noted in stable position. Pancreatic calcifications consistent with chronic pancreatitis. 3. Bibasilar subsegmental atelectasis.   Electronically Signed   By: TMarcello Moores Register   On: 09/08/2013 17:05       Assessment/Plan 1. Acute on chronic pancreatitis 2. Pancreatic head pseudocyst 3. Chronic pancreatic duct dilatation 4. S/p pancreatic duct stent placement  Plan: 1. I have discussed this case with Dr. MHassell Done  After review of his scan and chart, our recommendation would be to initially try to do an EUS aspiration for fluid sampling.  Removing  his current pancreatic stent, which may not be effective now 8 yrs after it was initially placed, and replacing it may help relieve some of this possible pancreatic dilatation, which may help a lot of his symptoms.  We have discussed the possibility of a modified [place] procedure, but this would need to be a last resort if prior, more conservative measures failed.   2. In general we tend to hold patient's diets while they are still having pain.  Will defer diet to GI and primary service.  Keaun Schnabel E 09/10/2013, 3:26 PM Pager: [phone]

## 2013-09-10 NOTE — Progress Notes (Signed)
ANTIBIOTIC CONSULT NOTE - INITIAL  Pharmacy Consult for Primaxin Indication: possible infected pancreatic cyst  No Known Allergies  Patient Measurements: Height: 5' 8.5" (174 cm) Weight: 149 lb 11.2 oz (67.903 kg) IBW/kg (Calculated) : 69.55   Vital Signs: Temp: 98.6 F (37 C) (06/19 1258) Temp src: Oral (06/19 1258) BP: 125/80 mmHg (06/19 0640) Pulse Rate: 78 (06/19 0640) Intake/Output from previous day: 06/18 0701 - 06/19 0700 In: 2500 [I.V.:2500] Out: -  Intake/Output from this shift:    Labs:  Recent Labs  09/08/13 1531 09/08/13 1553 09/09/13 0520  WBC 9.8  --  10.3  HGB 13.0 13.9 12.9*  PLT 145*  --  125*  CREATININE 0.90 0.90 0.90   Estimated Creatinine Clearance: 102.7 ml/min (by C-G formula based on Cr of 0.9). No results found for this basename: VANCOTROUGH, VANCOPEAK, VANCORANDOM, GENTTROUGH, GENTPEAK, GENTRANDOM, TOBRATROUGH, TOBRAPEAK, TOBRARND, AMIKACINPEAK, AMIKACINTROU, AMIKACIN,  in the last 72 hours   Microbiology: No results found for this or any previous visit (from the past 720 hour(s)).  Medical History: Past Medical History  Diagnosis Date  . Pancreatitis   . Chronic back pain     Medications:  Scheduled:  . docusate sodium  100 mg Oral BID  . heparin  5,000 Units Subcutaneous 3 times per day  . morphine   Intravenous 6 times per day  . piperacillin-tazobactam (ZOSYN)  IV  3.375 g Intravenous Q8H  . polyethylene glycol  17 g Oral Daily   Infusions:  . dextrose 5 % and 0.45 % NaCl with KCl 20 mEq/L 100 mL/hr at 09/10/13 1151   PRN: acetaminophen, diphenhydrAMINE, diphenhydrAMINE, naloxone, ondansetron (ZOFRAN) IV, ondansetron, sodium chloride  Assessment: 43 y/o M with acute superimposed on chronic pancreatitis.  CT showed pseudocyst.  Temp spike this AM - empiric Zosyn was ordered this AM, now switching to Primaxin   Goal of Therapy:  Appropriate antibiotic dosing for renal function; eradication of infection.  Plan:  1.  Primaxin 500 mg IV q8h 2. F/U on blood culture results 3. Follow clinical course  BorgerdingGaye Alken PharmD Pager #: [phone] 2:20 PM 09/10/2013

## 2013-09-10 NOTE — Progress Notes (Signed)
ANTIBIOTIC CONSULT NOTE - INITIAL  Pharmacy Consult for Zosyn Indication: intra-abdominal infection  No Known Allergies  Patient Measurements: Height: 5' 8.5" (174 cm) Weight: 149 lb 11.2 oz (67.903 kg) IBW/kg (Calculated) : 69.55   Vital Signs: Temp: 102.7 F (39.3 C) (06/19 1045) Temp src: Oral (06/19 1045) BP: 125/80 mmHg (06/19 0640) Pulse Rate: 78 (06/19 0640) Intake/Output from previous day: 06/18 0701 - 06/19 0700 In: 2500 [I.V.:2500] Out: -  Intake/Output from this shift:    Labs:  Recent Labs  09/08/13 1531 09/08/13 1553 09/09/13 0520  WBC 9.8  --  10.3  HGB 13.0 13.9 12.9*  PLT 145*  --  125*  CREATININE 0.90 0.90 0.90   Estimated Creatinine Clearance: 102.7 ml/min (by C-G formula based on Cr of 0.9). No results found for this basename: VANCOTROUGH, VANCOPEAK, VANCORANDOM, GENTTROUGH, GENTPEAK, GENTRANDOM, TOBRATROUGH, TOBRAPEAK, TOBRARND, AMIKACINPEAK, AMIKACINTROU, AMIKACIN,  in the last 72 hours   Microbiology: No results found for this or any previous visit (from the past 720 hour(s)).  Medical History: Past Medical History  Diagnosis Date  . Pancreatitis   . Chronic back pain     Medications:  Scheduled:  . docusate sodium  100 mg Oral BID  . heparin  5,000 Units Subcutaneous 3 times per day  . morphine   Intravenous 6 times per day  . polyethylene glycol  17 g Oral Daily   Infusions:  . dextrose 5 % and 0.45 % NaCl with KCl 20 mEq/L 100 mL/hr at 09/09/13 1643   PRN: acetaminophen, diphenhydrAMINE, diphenhydrAMINE, naloxone, ondansetron (ZOFRAN) IV, ondansetron, sodium chloride  Assessment: 43 y/o M with acute superimposed on chronic pancreatitis.  CT showed pseudocyst.  Temp spike this AM - empiric Zosyn was ordered with pharmacy dosing assistance requested.  Goal of Therapy:  Appropriate antibiotic dosing for renal function; eradication of infection.   Plan:  1. Zosyn 3.375 grams IV q8h (extended-infusion, each dose over 4  hours) 2. F/U on blood culture results 3. Follow clinical course  Clayburn Pert, PharmD, BCPS Pager: [phone] 09/10/2013  11:07 AM

## 2013-09-11 LAB — CBC
HCT: 35.7 % — ABNORMAL LOW (ref 39.0–52.0)
HEMOGLOBIN: 11.9 g/dL — AB (ref 13.0–17.0)
MCH: 29.8 pg (ref 26.0–34.0)
MCHC: 33.3 g/dL (ref 30.0–36.0)
MCV: 89.5 fL (ref 78.0–100.0)
Platelets: 100 10*3/uL — ABNORMAL LOW (ref 150–400)
RBC: 3.99 MIL/uL — AB (ref 4.22–5.81)
RDW: 11.7 % (ref 11.5–15.5)
WBC: 7.4 10*3/uL (ref 4.0–10.5)

## 2013-09-11 NOTE — Progress Notes (Addendum)
Progress Note   Lawrence Brock TFT:[id number] DOB: [birth year] DOA: 09/08/2013 PCP: No primary provider on file.   Brief Narrative:   Lawrence Brock is an 43 y.o. male with a PMH of chronic pancreatitis / h/o pancreatic stent placement, who was admitted 09/08/13 chief complaint of a two-week history of progressively worsening abdominal discomfort. Upon initial evaluation in the ED, the patient had elevated lipase levels and CT findings consistent with acute pancreatitis.  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  Triglycerides not elevated.  Continue conservative care with bowel rest, anti-nausea medication, and pain medication.  Continue PCA for better pain relief.  Lipase now within normal limits.  Repeat labs in a.m.  Given fever, worry about infected pseudocyst.  Now on Imipenem.  Surgery/GI following.  ? Endoscopic ultrasound.  Active Problems: Fever  The patient spiked a fever 09/09/13.  Followup 2 sets of blood cultures.  Continue Tylenol when necessary.  Started empiric Zosyn 09/10/13 which was switched to Imipenem for better pancreas tissue penetration.  Hypokalemia  Resolved with potassium added to IV fluids.  Thrombocytopenia, unspecified/Normocytic anemia  Suspect related to bone marrow suppression from alcohol use but may be reflective of chronic inflammation.  Unspecified constipation  Continue stool softener and MiraLAX.  DVT Prophylaxis  Continue heparin.  Code Status: Full. Family Communication: Wife updated at bedside 09/10/13. Disposition Plan: Home when stable.   IV Access:    Peripheral IV   Procedures:    None.   Medical Consultants:    Dr. Silvano Rusk, GI  Surgery   Other Consultants:    None.   Anti-Infectives:    Zosyn 09/10/13--->09/10/13  Imipenem  09/10/13--->  Subjective:   Lawrence Brock says his pain is down to a 4 with PCA.  No nausea or vomiting.  No black or bloody stools.  Objective:    Filed Vitals:   09/11/13 0203 09/11/13 0256 09/11/13 0400 09/11/13 0545  BP: 136/81   112/71  Pulse: 90   68  Temp: 102.8 F (39.3 C) 99.2 F (37.3 C)  98.3 F (36.8 C)  TempSrc: Oral Oral  Oral  Resp: 16  16 18   Height:      Weight:      SpO2: 96%  97% 97%    Intake/Output Summary (Last 24 hours) at 09/11/13 0720 Last data filed at 09/11/13 0400  Gross per 24 hour  Intake   2400 ml  Output      0 ml  Net   2400 ml    Exam: Gen:  NAD Cardiovascular:  RRR, No M/R/G Respiratory:  Lungs CTAB Gastrointestinal:  Abdomen slightly firm, tender mid epigastrium, + BS Extremities:  No C/E/C   Data Reviewed:    Labs: Basic Metabolic Panel:  Recent Labs Lab 09/08/13 1531 09/08/13 1553 09/09/13 0520  NA 139 140 138  K 3.5* 3.7 3.7  CL 102 102 103  CO2 24  --  23  GLUCOSE 103* 105* 114*  BUN 23 21 14   CREATININE 0.90 0.90 0.90  CALCIUM 8.9  --  8.6   GFR Estimated Creatinine Clearance: 102.7 ml/min (by C-G formula based on Cr of 0.9). Liver Function Tests:  Recent Labs Lab 09/08/13 1531  AST 13  ALT 12  ALKPHOS 51  BILITOT 0.5  PROT 7.1  ALBUMIN 3.2*    Recent Labs Lab 09/08/13 1531 09/09/13 0520  LIPASE 114* 53   CBC:  Recent Labs Lab 09/08/13 1531 09/08/13 1553 09/09/13 0520 09/11/13 0447  WBC 9.8  --  10.3 7.4  NEUTROABS 7.7  --   --   --   HGB 13.0 13.9 12.9* 11.9*  HCT 38.9* 41.0 38.4* 35.7*  MCV 90.7  --  90.4 89.5  PLT 145*  --  125* 100*   Microbiology No results found for this or any previous visit (from the past 240 hour(s)).   Radiographs/Studies:   Ct Abdomen Pelvis W Contrast  09/08/2013   CLINICAL DATA:  Patient diagnosed with pancreatitis on September 04, 2013. Now has worsened abdomen pain.  EXAM: CT ABDOMEN AND PELVIS WITH CONTRAST  TECHNIQUE: Multidetector CT imaging of  the abdomen and pelvis was performed using the standard protocol following bolus administration of intravenous contrast.  CONTRAST:  163mL OMNIPAQUE IOHEXOL 300 MG/ML  SOLN  COMPARISON:  CT abdomen and pelvis April 09, 2005  FINDINGS: There is edema with multiloculated cystic structure in the pancreatic head and peripancreatic stranding consistent with patient's known pancreatitis with pancreatic pseudocyst measuring 2.7 x 4.8 cm. There is chronic pancreatic duct dilatation with changes of chronic pancreatitis.  There is mild diffuse fatty infiltration of liver. The gallbladder, spleen, adrenal glands are normal. There are cysts in bilateral kidneys, largest in the lower pole left kidney measuring 3.2 x 3.4 cm. There is no hydronephrosis bilaterally. There is atherosclerosis of the abdominal aorta without aneurysmal dilatation. There is no abdominal lymphadenopathy. There is no small bowel obstruction or diverticulitis. The appendix is not seen but no inflammation is noted around the cecum.  Fluid-filled bladder is normal. There is no pelvic lymphadenopathy. No acute abnormality is identified within the visualized bones. There is atelectasis of the bilateral lung bases.  IMPRESSION: Acute superimposed on chronic pancreatitis. 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   Electronically Signed   By: Abelardo Diesel M.D.   On: 09/08/2013 19:11   Dg Abd Acute W/chest  09/08/2013   CLINICAL DATA:  Pain and fever.  EXAM: ACUTE ABDOMEN SERIES (ABDOMEN 2 VIEW & CHEST 1 VIEW)  COMPARISON:  Abdominal series 09/03/2013 .  FINDINGS: Mediastinum and hilar structures are normal. Mild basilar atelectasis. No pleural effusion or pneumothorax. Heart size normal.  Soft tissues of the abdomen are unremarkable. What appears to be a pancreatic duct stent is in stable position. Calcifications noted throughout the pancreas consistent with chronic pancreatitis. Large amount of stool in colon consistent with constipation. No  bowel distention or free air.  IMPRESSION: 1. Constipation. 2. Pancreatic duct stent noted in stable position. Pancreatic calcifications consistent with chronic pancreatitis. 3. Bibasilar subsegmental atelectasis.   Electronically Signed   By: Marcello Moores  Register   On: 09/08/2013 17:05     Medications:   . docusate sodium  100 mg Oral BID  . heparin  5,000 Units Subcutaneous 3 times per day  . imipenem-cilastatin  500 mg Intravenous 3 times per day  . morphine   Intravenous 6 times per day  . polyethylene glycol  17 g Oral Daily   Continuous Infusions: . dextrose 5 % and 0.45 % NaCl with KCl 20 mEq/L 100 mL/hr at 09/10/13 1151    Time spent: 25 minutes.    LOS: 3 days   [facility] Pager [phone]. If unable to reach me by pager, please call my cell phone at [phone].  *Please refer to [email or web address], password [email or web address] to get updated schedule on who will round  on this patient, as hospitalists switch teams weekly. If 7PM-7AM, please contact night-coverage at [email or web address], password [email or web address] for any overnight needs.  09/11/2013, 7:20 AM    **Disclaimer: This note was dictated with voice recognition software. Similar sounding words can inadvertently be transcribed and this note may contain transcription errors which may not have been corrected upon publication of note.**

## 2013-09-11 NOTE — Progress Notes (Addendum)
Cross cover [facility] Subjective: Patient seems to be pain free today and wants to eat. Denies having any nausea or vomiting. Currently afebrile. Has several family members at bedside.   Objective: Vital signs in last 24 hours: Temp:  [98.3 F (36.8 C)-102.8 F (39.3 C)] 99.1 F (37.3 C) (06/20 1500) Pulse Rate:  [68-90] 76 (06/20 1500) Resp:  [16-21] 18 (06/20 1500) BP: (112-136)/(71-82) 122/75 mmHg (06/20 1500) SpO2:  [96 %-99 %] 99 % (06/20 1500) Last BM Date: 09/08/13  Intake/Output from previous day: 06/19 0701 - 06/20 0700 In: 2400 [I.V.:2200; IV Piggyback:200] Out: -  Intake/Output this shift:   General appearance: alert, cooperative, appears stated age, no distress and pale Resp: clear to auscultation bilaterally Cardio: regular rate and rhythm, S1, S2 normal, no murmur, click, rub or gallop GI: soft, non-tender; bowel sounds normal; no masses,  no organomegaly Extremities: extremities normal, atraumatic, no cyanosis or edema  Lab Results:  Recent Labs  09/08/13 1553 09/09/13 0520 09/11/13 0447  WBC  --  10.3 7.4  HGB 13.9 12.9* 11.9*  HCT 41.0 38.4* 35.7*  PLT  --  125* 100*   BMET  Recent Labs  09/08/13 1553 09/09/13 0520  NA 140 138  K 3.7 3.7  CL 102 103  CO2  --  23  GLUCOSE 105* 114*  BUN 21 14  CREATININE 0.90 0.90  CALCIUM  --  8.6   Studies/Results: No results found.  Medications: I have reviewed the patient's current medications.  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 Pain free today. Seems to be improving slowly-continiue present care. Will allow him some clears and see how he does.  LOS: 3 days   MANN,JYOTHI 09/11/2013, 3:34 PM

## 2013-09-12 LAB — COMPREHENSIVE METABOLIC PANEL
ALBUMIN: 2.6 g/dL — AB (ref 3.5–5.2)
ALK PHOS: 46 U/L (ref 39–117)
ALT: 8 U/L (ref 0–53)
AST: 9 U/L (ref 0–37)
BILIRUBIN TOTAL: 1.1 mg/dL (ref 0.3–1.2)
BUN: 8 mg/dL (ref 6–23)
CHLORIDE: 98 meq/L (ref 96–112)
CO2: 24 meq/L (ref 19–32)
Calcium: 8.5 mg/dL (ref 8.4–10.5)
Creatinine, Ser: 0.91 mg/dL (ref 0.50–1.35)
GFR calc Af Amer: >90 mL/min (ref >90)
GFR calc non Af Amer: >90 mL/min (ref >90)
Glucose, Bld: 122 mg/dL — ABNORMAL HIGH (ref 70–99)
POTASSIUM: 4.3 meq/L (ref 3.7–5.3)
SODIUM: 134 meq/L — AB (ref 137–147)
Total Protein: 6.4 g/dL (ref 6.0–8.3)

## 2013-09-12 LAB — CBC
HCT: 36.1 % — ABNORMAL LOW (ref 39.0–52.0)
Hemoglobin: 12.1 g/dL — ABNORMAL LOW (ref 13.0–17.0)
MCH: 29.6 pg (ref 26.0–34.0)
MCHC: 33.5 g/dL (ref 30.0–36.0)
MCV: 88.3 fL (ref 78.0–100.0)
PLATELETS: 99 10*3/uL — AB (ref 150–400)
RBC: 4.09 MIL/uL — AB (ref 4.22–5.81)
RDW: 11.8 % (ref 11.5–15.5)
WBC: 6.2 10*3/uL (ref 4.0–10.5)

## 2013-09-12 LAB — LIPASE, BLOOD: LIPASE: 15 U/L (ref 11–59)

## 2013-09-12 NOTE — Progress Notes (Signed)
Cross cover [facility] Subjective: Since I last evaluated the patient, he seems to be doing a lot better but still rates his pain at 5/10 requiring morphine. He is tolerating his diet well. Denies having any nausea or vomiting.   Objective: Vital signs in last 24 hours: Temp:  [98.7 F (37.1 C)-100.1 F (37.8 C)] 98.7 F (37.1 C) (06/21 0605) Pulse Rate:  [65-83] 65 (06/21 0605) Resp:  [16-20] 18 (06/21 0605) BP: (111-134)/(54-90) 111/54 mmHg (06/21 0605) SpO2:  [96 %-99 %] 97 % (06/21 0605) FiO2 (%):  [0 %] 0 % (06/21 0400) Last BM Date: 09/11/13  Intake/Output from previous day: 06/20 0701 - 06/21 0700 In: 540 [P.O.:540] Out: -  Intake/Output this shift: Total I/O In: 360 [P.O.:360] Out: -   General appearance: alert, cooperative, appears stated age, no distress and pale Resp: clear to auscultation bilaterally Cardio: regular rate and rhythm, S1, S2 normal, no murmur, click, rub or gallop GI: soft, non-tender; bowel sounds normal; no masses,  no organomegaly Extremities: extremities normal, atraumatic, no cyanosis or edema  Lab Results:  Recent Labs  09/11/13 0447 09/12/13 0510  WBC 7.4 6.2  HGB 11.9* 12.1*  HCT 35.7* 36.1*  PLT 100* 99*   BMET  Recent Labs  09/12/13 0510  NA 134*  K 4.3  CL 98  CO2 24  GLUCOSE 122*  BUN 8  CREATININE 0.91  CALCIUM 8.5   LFT  Recent Labs  09/12/13 0510  PROT 6.4  ALBUMIN 2.6*  AST 9  ALT 8  ALKPHOS 46  BILITOT 1.1   Medications: I have reviewed the patient's current medications.  Assessment/Plan: 1) Alcoholic pancreatitis with a pancreatic psuedocyst-improving slowly. Will discuss with Dr. [name] about doing an EUS. Continue present care. 2) Thrombocytopenia.   LOS: 4 days   MANN,JYOTHI 09/12/2013, 11:11 AM

## 2013-09-12 NOTE — Progress Notes (Signed)
Progress Note   Lawrence Brock MBW:[id number] DOB: 06-04-1970 DOA: 09/08/2013 PCP: No primary provider on file.   Brief Narrative:   Lawrence Brock is an 43 y.o. male with a PMH of chronic pancreatitis / h/o pancreatic stent placement, who was admitted 09/08/13 chief complaint of a two-week history of progressively worsening abdominal discomfort. Upon initial evaluation in the ED, the patient had elevated lipase levels and CT findings consistent with acute pancreatitis.  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  Triglycerides not elevated.  Continue conservative care with bowel rest (currently on a clear liquid diet), anti-nausea medication, and pain medication.  Continue PCA for better pain relief.  Lipase remains within normal limits.    Continue Imipenem.  Surgery/GI following.  ? Endoscopic ultrasound.  Active Problems: Fever  The patient spiked a fever 09/09/13.  Followup 2 sets of blood cultures.  Continue Tylenol when necessary.  Continue imipenem. Fever curve down.  Hypokalemia  Resolved with potassium added to IV fluids.  Thrombocytopenia, unspecified/Normocytic anemia  Suspect related to bone marrow suppression from alcohol use but may be reflective of chronic inflammation.  Unspecified constipation  Continue stool softener and MiraLAX.  DVT Prophylaxis  Continue heparin.  Code Status: Full. Family Communication: Wife updated at bedside. Disposition Plan: Home when stable.   IV Access:    Peripheral IV   Procedures:    None.   Medical Consultants:    Dr. Silvano Rusk, GI  Dr. Johnathan Hausen, Surgery   Other Consultants:    None.   Anti-Infectives:    Zosyn 09/10/13--->09/10/13  Imipenem 09/10/13--->  Subjective:   Lawrence Brock says his pain is mostly resolved, using  PCA infrequently.  No nausea or vomiting.  No black or bloody stools, but stools a bit loose.  Objective:    Filed Vitals:   09/12/13 0030 09/12/13 0211 09/12/13 0400 09/12/13 0605  BP:  114/75  111/54  Pulse:  70  65  Temp:  99.3 F (37.4 C)  98.7 F (37.1 C)  TempSrc:  Oral  Oral  Resp: 16 18 16 18   Height:      Weight:      SpO2: 98% 97% 99% 97%    Intake/Output Summary (Last 24 hours) at 09/12/13 0737 Last data filed at 09/11/13 1700  Gross per 24 hour  Intake    540 ml  Output      0 ml  Net    540 ml    Exam: Gen:  NAD Cardiovascular:  RRR, No M/R/G Respiratory:  Lungs CTAB Gastrointestinal:  Abdomen slightly firm, tender mid epigastrium, + BS Extremities:  No C/E/C   Data Reviewed:    Labs: Basic Metabolic Panel:  Recent Labs Lab 09/08/13 1531 09/08/13 1553 09/09/13 0520 09/12/13 0510  NA 139 140 138 134*  K 3.5* 3.7 3.7 4.3  CL 102 102 103 98  CO2 24  --  23 24  GLUCOSE 103* 105* 114* 122*  BUN 23 21 14 8   CREATININE 0.90 0.90 0.90 0.91  CALCIUM 8.9  --  8.6 8.5   GFR Estimated Creatinine Clearance: 101.6 ml/min (by C-G formula based on Cr of 0.91). Liver Function Tests:  Recent Labs Lab 09/08/13 1531 09/12/13 0510  AST 13 9  ALT 12 8  ALKPHOS 51 46  BILITOT 0.5 1.1  PROT 7.1 6.4  ALBUMIN 3.2* 2.6*  Recent Labs Lab 09/08/13 1531 09/09/13 0520 09/12/13 0510  LIPASE 114* 53 15   CBC:  Recent Labs Lab 09/08/13 1531 09/08/13 1553 09/09/13 0520 09/11/13 0447 09/12/13 0510  WBC 9.8  --  10.3 7.4 6.2  NEUTROABS 7.7  --   --   --   --   HGB 13.0 13.9 12.9* 11.9* 12.1*  HCT 38.9* 41.0 38.4* 35.7* 36.1*  MCV 90.7  --  90.4 89.5 88.3  PLT 145*  --  125* 100* 99*   Microbiology Recent Results (from the past 240 hour(s))  CULTURE, BLOOD (ROUTINE X 2)     Status: None   Collection Time    09/09/13  6:29 PM      Result Value Ref Range Status   Specimen Description BLOOD RIGHT ARM   Final   Special Requests BOTTLES DRAWN  AEROBIC AND ANAEROBIC 5CC   Final   Culture  Setup Time     Final   Value: 09/10/2013 04:40     Performed at [facility]   Culture     Final   Value:        BLOOD CULTURE RECEIVED NO GROWTH TO DATE CULTURE WILL BE HELD FOR 5 DAYS BEFORE ISSUING A FINAL NEGATIVE REPORT     Performed at [facility]   Report Status PENDING   Incomplete  CULTURE, BLOOD (ROUTINE X 2)     Status: None   Collection Time    09/09/13  6:47 PM      Result Value Ref Range Status   Specimen Description BLOOD LEFT ARM   Final   Special Requests BOTTLES DRAWN AEROBIC AND ANAEROBIC 4CC   Final   Culture  Setup Time     Final   Value: 09/10/2013 04:40     Performed at [facility]   Culture     Final   Value:        BLOOD CULTURE RECEIVED NO GROWTH TO DATE CULTURE WILL BE HELD FOR 5 DAYS BEFORE ISSUING A FINAL NEGATIVE REPORT     Performed at [facility]   Report Status PENDING   Incomplete     Radiographs/Studies:   Ct Abdomen Pelvis W Contrast  09/08/2013   CLINICAL DATA:  Patient diagnosed with pancreatitis on September 04, 2013. Now has worsened abdomen pain.  EXAM: CT ABDOMEN AND PELVIS WITH CONTRAST  TECHNIQUE: Multidetector CT imaging of the abdomen and pelvis was performed using the standard protocol following bolus administration of intravenous contrast.  CONTRAST:  158mL OMNIPAQUE IOHEXOL 300 MG/ML  SOLN  COMPARISON:  CT abdomen and pelvis April 09, 2005  FINDINGS: There is edema with multiloculated cystic structure in the pancreatic head and peripancreatic stranding consistent with patient's known pancreatitis with pancreatic pseudocyst measuring 2.7 x 4.8 cm. There is chronic pancreatic duct dilatation with changes of chronic pancreatitis.  There is mild diffuse fatty infiltration of liver. The gallbladder, spleen, adrenal glands are normal. There are cysts in bilateral kidneys, largest in the lower pole left kidney measuring 3.2 x 3.4 cm. There is no hydronephrosis bilaterally.  There is atherosclerosis of the abdominal aorta without aneurysmal dilatation. There is no abdominal lymphadenopathy. There is no small bowel obstruction or diverticulitis. The appendix is not seen but no inflammation is noted around the cecum.  Fluid-filled bladder is normal. There is no pelvic lymphadenopathy. No acute abnormality is identified within the visualized bones. There is atelectasis of the bilateral lung bases.  IMPRESSION: Acute superimposed on  chronic pancreatitis. 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   Electronically Signed   By: Abelardo Diesel M.D.   On: 09/08/2013 19:11   Dg Abd Acute W/chest  09/08/2013   CLINICAL DATA:  Pain and fever.  EXAM: ACUTE ABDOMEN SERIES (ABDOMEN 2 VIEW & CHEST 1 VIEW)  COMPARISON:  Abdominal series 09/03/2013 .  FINDINGS: Mediastinum and hilar structures are normal. Mild basilar atelectasis. No pleural effusion or pneumothorax. Heart size normal.  Soft tissues of the abdomen are unremarkable. What appears to be a pancreatic duct stent is in stable position. Calcifications noted throughout the pancreas consistent with chronic pancreatitis. Large amount of stool in colon consistent with constipation. No bowel distention or free air.  IMPRESSION: 1. Constipation. 2. Pancreatic duct stent noted in stable position. Pancreatic calcifications consistent with chronic pancreatitis. 3. Bibasilar subsegmental atelectasis.   Electronically Signed   By: Marcello Moores  Register   On: 09/08/2013 17:05     Medications:   . docusate sodium  100 mg Oral BID  . heparin  5,000 Units Subcutaneous 3 times per day  . imipenem-cilastatin  500 mg Intravenous 3 times per day  . morphine   Intravenous 6 times per day  . polyethylene glycol  17 g Oral Daily   Continuous Infusions: . dextrose 5 % and 0.45 % NaCl with KCl 20 mEq/L 100 mL/hr at 09/11/13 2159    Time spent: 25 minutes.    LOS: 4 days   [facility] Pager [phone]. If unable to  reach me by pager, please call my cell phone at [phone].  *Please refer to [email or web address], password [email or web address] to get updated schedule on who will round on this patient, as hospitalists switch teams weekly. If 7PM-7AM, please contact night-coverage at [email or web address], password [email or web address] for any overnight needs.  09/12/2013, 7:37 AM    **Disclaimer: This note was dictated with voice recognition software. Similar sounding words can inadvertently be transcribed and this note may contain transcription errors which may not have been corrected upon publication of note.**

## 2013-09-13 ENCOUNTER — Encounter (HOSPITAL_COMMUNITY): Payer: Self-pay | Admitting: *Deleted

## 2013-09-13 DIAGNOSIS — Z4689 Encounter for fitting and adjustment of other specified devices: Secondary | ICD-10-CM

## 2013-09-13 MED ORDER — OXYCODONE-ACETAMINOPHEN 5-325 MG PO TABS
1.0000 | ORAL_TABLET | ORAL | Status: DC | PRN
  Administered 2013-09-13 – 2013-09-15 (×5): 2 via ORAL
  Filled 2013-09-13 (×5): qty 2

## 2013-09-13 MED ORDER — LACTATED RINGERS IV SOLN
INTRAVENOUS | Status: DC
  Administered 2013-09-13: 1000 mL via INTRAVENOUS

## 2013-09-13 NOTE — Progress Notes (Addendum)
Progress Note   Lawrence Brock ONG:[id number] DOB: 02-Oct-1970 DOA: 09/08/2013 PCP: No primary provider on file.   Brief Narrative:   Lawrence Brock is an 43 y.o. male with a PMH of chronic pancreatitis / h/o pancreatic stent placement, who was admitted 09/08/13 chief complaint of a two-week history of progressively worsening abdominal discomfort. Upon initial evaluation in the ED, the patient had elevated lipase levels and CT findings consistent with acute pancreatitis.  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  GI to re-evaluate today for stent removal.  Triglycerides not elevated.  Continue conservative care with bowel rest (currently on a clear liquid diet), anti-nausea medication, and pain medication.  D/C PCA.  Oxycodone PRN pain.  Lipase remains within normal limits.    Continue Imipenem.  Surgery/GI following no current recommendations for surgery.  ? Endoscopic ultrasound / stent removal per GI.  Active Problems: Fever  The patient spiked a fever 09/09/13.  Followup 2 sets of blood cultures.  Continue Tylenol when necessary.  Continue imipenem. Fever resolved.  Hypokalemia  Resolved with potassium added to IV fluids.  Thrombocytopenia, unspecified/Normocytic anemia  Suspect related to bone marrow suppression from alcohol use but may be reflective of chronic inflammation.  Unspecified constipation  Continue stool softener and MiraLAX.  Bowels moving.  DVT Prophylaxis  D/C heparin secondary to thrombocytopenia.  Use SCDs.  Code Status: Full. Family Communication: Wife updated at bedside 09/12/13. Disposition Plan: Home when stable.   IV Access:    Peripheral IV   Procedures:    None.   Medical Consultants:    Dr. Silvano Rusk, GI  Dr. Johnathan Hausen, Surgery   Other Consultants:     None.   Anti-Infectives:    Zosyn 09/10/13--->09/10/13  Imipenem 09/10/13--->  Subjective:   Lawrence Brock says his pain is mostly resolved, using PCA infrequently.  No nausea or vomiting.  No black or bloody stools, but stools still a bit loose.  Tolerating diet advancement.  Objective:    Filed Vitals:   09/13/13 0247 09/13/13 0351 09/13/13 0531 09/13/13 0800  BP: 116/78  104/69   Pulse: 59  68   Temp: 97.3 F (36.3 C)  97.9 F (36.6 C)   TempSrc: Oral  Oral   Resp: 16 16 16 20   Height:      Weight:      SpO2: 98% 98% 97% 100%    Intake/Output Summary (Last 24 hours) at 09/13/13 0857 Last data filed at 09/12/13 1808  Gross per 24 hour  Intake    840 ml  Output      0 ml  Net    840 ml    Exam: Gen:  NAD Cardiovascular:  RRR, No M/R/G Respiratory:  Lungs CTAB Gastrointestinal:  Abdomen soft, NT, + BS Extremities:  No C/E/C   Data Reviewed:    Labs: Basic Metabolic Panel:  Recent Labs Lab 09/08/13 1531 09/08/13 1553 09/09/13 0520 09/12/13 0510  NA 139 140 138 134*  K 3.5* 3.7 3.7 4.3  CL 102 102 103 98  CO2 24  --  23 24  GLUCOSE 103* 105* 114* 122*  BUN 23 21 14 8   CREATININE 0.90 0.90 0.90 0.91  CALCIUM 8.9  --  8.6 8.5   GFR Estimated Creatinine Clearance: 101.6 ml/min (by C-G formula based on Cr of 0.91). Liver Function Tests:  Recent Labs Lab  09/08/13 1531 09/12/13 0510  AST 13 9  ALT 12 8  ALKPHOS 51 46  BILITOT 0.5 1.1  PROT 7.1 6.4  ALBUMIN 3.2* 2.6*    Recent Labs Lab 09/08/13 1531 09/09/13 0520 09/12/13 0510  LIPASE 114* 53 15   CBC:  Recent Labs Lab 09/08/13 1531 09/08/13 1553 09/09/13 0520 09/11/13 0447 09/12/13 0510  WBC 9.8  --  10.3 7.4 6.2  NEUTROABS 7.7  --   --   --   --   HGB 13.0 13.9 12.9* 11.9* 12.1*  HCT 38.9* 41.0 38.4* 35.7* 36.1*  MCV 90.7  --  90.4 89.5 88.3  PLT 145*  --  125* 100* 99*   Microbiology Recent Results (from the past 240 hour(s))  CULTURE, BLOOD (ROUTINE X 2)      Status: None   Collection Time    09/09/13  6:29 PM      Result Value Ref Range Status   Specimen Description BLOOD RIGHT ARM   Final   Special Requests BOTTLES DRAWN AEROBIC AND ANAEROBIC 5CC   Final   Culture  Setup Time     Final   Value: 09/10/2013 04:40     Performed at [facility]   Culture     Final   Value:        BLOOD CULTURE RECEIVED NO GROWTH TO DATE CULTURE WILL BE HELD FOR 5 DAYS BEFORE ISSUING A FINAL NEGATIVE REPORT     Performed at [facility]   Report Status PENDING   Incomplete  CULTURE, BLOOD (ROUTINE X 2)     Status: None   Collection Time    09/09/13  6:47 PM      Result Value Ref Range Status   Specimen Description BLOOD LEFT ARM   Final   Special Requests BOTTLES DRAWN AEROBIC AND ANAEROBIC 4CC   Final   Culture  Setup Time     Final   Value: 09/10/2013 04:40     Performed at [facility]   Culture     Final   Value:        BLOOD CULTURE RECEIVED NO GROWTH TO DATE CULTURE WILL BE HELD FOR 5 DAYS BEFORE ISSUING A FINAL NEGATIVE REPORT     Performed at [facility]   Report Status PENDING   Incomplete     Radiographs/Studies:   Ct Abdomen Pelvis W Contrast  09/08/2013   CLINICAL DATA:  Patient diagnosed with pancreatitis on September 04, 2013. Now has worsened abdomen pain.  EXAM: CT ABDOMEN AND PELVIS WITH CONTRAST  TECHNIQUE: Multidetector CT imaging of the abdomen and pelvis was performed using the standard protocol following bolus administration of intravenous contrast.  CONTRAST:  113mL OMNIPAQUE IOHEXOL 300 MG/ML  SOLN  COMPARISON:  CT abdomen and pelvis April 09, 2005  FINDINGS: There is edema with multiloculated cystic structure in the pancreatic head and peripancreatic stranding consistent with patient's known pancreatitis with pancreatic pseudocyst measuring 2.7 x 4.8 cm. There is chronic pancreatic duct dilatation with changes of chronic pancreatitis.  There is mild diffuse fatty infiltration of liver. The gallbladder,  spleen, adrenal glands are normal. There are cysts in bilateral kidneys, largest in the lower pole left kidney measuring 3.2 x 3.4 cm. There is no hydronephrosis bilaterally. There is atherosclerosis of the abdominal aorta without aneurysmal dilatation. There is no abdominal lymphadenopathy. There is no small bowel obstruction or diverticulitis. The appendix is not seen but no inflammation is noted around the cecum.  Fluid-filled bladder is normal. There is no pelvic lymphadenopathy. No acute abnormality is identified within the visualized bones. There is atelectasis of the bilateral lung bases.  IMPRESSION: Acute superimposed on chronic pancreatitis. 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   Electronically Signed   By: Abelardo Diesel M.D.   On: 09/08/2013 19:11   Dg Abd Acute W/chest  09/08/2013   CLINICAL DATA:  Pain and fever.  EXAM: ACUTE ABDOMEN SERIES (ABDOMEN 2 VIEW & CHEST 1 VIEW)  COMPARISON:  Abdominal series 09/03/2013 .  FINDINGS: Mediastinum and hilar structures are normal. Mild basilar atelectasis. No pleural effusion or pneumothorax. Heart size normal.  Soft tissues of the abdomen are unremarkable. What appears to be a pancreatic duct stent is in stable position. Calcifications noted throughout the pancreas consistent with chronic pancreatitis. Large amount of stool in colon consistent with constipation. No bowel distention or free air.  IMPRESSION: 1. Constipation. 2. Pancreatic duct stent noted in stable position. Pancreatic calcifications consistent with chronic pancreatitis. 3. Bibasilar subsegmental atelectasis.   Electronically Signed   By: Marcello Moores  Register   On: 09/08/2013 17:05     Medications:   . docusate sodium  100 mg Oral BID  . heparin  5,000 Units Subcutaneous 3 times per day  . imipenem-cilastatin  500 mg Intravenous 3 times per day  . morphine   Intravenous 6 times per day  . polyethylene glycol  17 g Oral Daily   Continuous Infusions: . dextrose 5 % and  0.45 % NaCl with KCl 20 mEq/L 100 mL/hr at 09/13/13 0843    Time spent: 25 minutes.    LOS: 5 days   [facility] Pager [phone]. If unable to reach me by pager, please call my cell phone at [phone].  *Please refer to [email or web address], password [email or web address] to get updated schedule on who will round on this patient, as hospitalists switch teams weekly. If 7PM-7AM, please contact night-coverage at [email or web address], password [email or web address] for any overnight needs.  09/13/2013, 8:57 AM    **Disclaimer: This note was dictated with voice recognition software. Similar sounding words can inadvertently be transcribed and this note may contain transcription errors which may not have been corrected upon publication of note.**

## 2013-09-13 NOTE — Progress Notes (Signed)
ANTIBIOTIC CONSULT NOTE - Follow up  Pharmacy Consult for Primaxin Indication: possible infected pancreatic cyst  No Known Allergies  Patient Measurements: Height: 5' 8.5" (174 cm) Weight: 149 lb 11.2 oz (67.903 kg) IBW/kg (Calculated) : 69.55   Vital Signs: Temp: 97.3 F (36.3 C) (06/22 0247) Temp src: Oral (06/22 0247) BP: 116/78 mmHg (06/22 0247) Pulse Rate: 59 (06/22 0247) Intake/Output from previous day: 06/21 0701 - 06/22 0700 In: 840 [P.O.:840] Out: -   Labs:  Recent Labs  09/11/13 0447 09/12/13 0510  WBC 7.4 6.2  HGB 11.9* 12.1*  PLT 100* 99*  CREATININE  --  0.91   Estimated Creatinine Clearance: 101.6 ml/min (by C-G formula based on Cr of 0.91).    Anti-infectives: 6/19 >> Primaxin >>   Assessment: 43 y/o M with acute superimposed on chronic pancreatitis, likely triggered by alcohol use. CT shows pancreatic pseudocyst.  Empiric antibiotics ordered after temp spike in setting of acute pancreatitis w/pseudocyst.  Pharmacy is consulted to dose Primaxin.  Tmax: 99  WBCs: WNL  Renal: SCr 0.91, CrCl >100 CG  Blood cultures (6/18) : no growth to date   Goal of Therapy:  Appropriate antibiotic dosing for renal function; eradication of infection.  Plan:   Continue Primaxin 500 mg IV q8h  Follow up cultures and renal function.   Gretta Arab PharmD, BCPS Pager [phone] 09/13/2013 8:05 AM

## 2013-09-13 NOTE — Progress Notes (Signed)
    Progress Note   Subjective  Patient reports abdominal pain has certainly improved since admission, PCA being discontinued in favor of oral narcotics Ate soft breakfast and tolerated it without increase in pain Regular bowel movement without melena Denies N/V   Objective  Vital signs in last 24 hours: Temp:  [97.3 F (36.3 C)-99 F (37.2 C)] 98.1 F (36.7 C) (06/22 1029) Pulse Rate:  [59-69] 65 (06/22 1029) Resp:  [14-20] 18 (06/22 1029) BP: (104-122)/(69-79) 110/70 mmHg (06/22 1029) SpO2:  [95 %-100 %] 98 % (06/22 1029) Last BM Date: 09/12/13 Gen: awake, alert, NAD HEENT: anicteric, op clear CV: RRR, no mrg Pulm: CTA b/l Abd: soft, NT/ND, +BS throughout Ext: no c/c/e Neuro: nonfocal   Intake/Output from previous day: 06/21 0701 - 06/22 0700 In: 840 [P.O.:840] Out: -  Intake/Output this shift: Total I/O In: 240 [P.O.:240] Out: -   Lab Results:  Recent Labs  09/11/13 0447 09/12/13 0510  WBC 7.4 6.2  HGB 11.9* 12.1*  HCT 35.7* 36.1*  PLT 100* 99*   BMET  Recent Labs  09/12/13 0510  NA 134*  K 4.3  CL 98  CO2 24  GLUCOSE 122*  BUN 8  CREATININE 0.91  CALCIUM 8.5   LFT  Recent Labs  09/12/13 0510  PROT 6.4  ALBUMIN 2.6*  AST 9  ALT 8  ALKPHOS 65  BILITOT 1.1   CT abd/pelvis -- reviewed   Assessment & Plan  43 year old male with a past medical history of alcohol abuse, chronic pancreatitis admitted with acute on chronic pancreatitis found to have pseudocysts. Previously placed pancreatic duct stent appears to be in place by imaging. Discussion with patient, he recalls pancreatic stent being placed at [facility] approximately 6-7 years ago.  He reports he does not recall their recommendations after this procedure  1.  alcoholic acute on chronic pancreatitis -- he is on antibiotics with no further fever. White count is normal. Liver enzymes normal and lipase has normalized.  Alcohol cessation is paramount. Certainly the  pancreatic duct stent these to be removed as this can be a nidus of infection, and also over time promote inflammation and further problem such as stricturing. Discussed with Dr. Leroy Kennedy and Dr. [name].  We all agree that pancreatic duct stent needs to be removed endoscopically. We will continue supportive care thereafter with office followup and plans for repeat imaging in 6-8 weeks. If stent has migrated into the duct he may need tertiary center for removal.  Plan EGD today with MAC. The nature of the procedure, as well as the risks, benefits, and alternatives were carefully and thoroughly reviewed with the patient. Ample time for discussion and questions allowed. The patient understood, was satisfied, and agreed to proceed.      Principal Problem:   Acute pancreatitis Active Problems:   Hypokalemia   Thrombocytopenia, unspecified   Normocytic anemia   Pancreatic pseudocyst   Unspecified constipation   Fever   Chronic pancreatitis     LOS: 5 days   PYRTLE, JAY M  09/13/2013, 11:56 AM

## 2013-09-13 NOTE — Progress Notes (Addendum)
    I have reviewed weekend events and here is my update.  I did not grasp (on Friday)  he apparently has a retained stent in the pancreas.   It looks too far right on KUB but when I look at images of the CT what I thought was contrast in bowel is probably a retained PD stent - not mentioned in the report.  I have discussed w/ Dr. Hilarie Fredrickson who will see the patient today and will cc: colleagues Dr. Fuller Plan and [name] also.  Based upon this knowledge he will need removal of the pancreatic stent first. Dr. Hilarie Fredrickson does not do ERCP but may be able to pull the stent.   Gatha Mayer, MD, [facility] [phone] (pager) 09/13/2013 8:33 AM

## 2013-09-13 NOTE — Progress Notes (Signed)
  Subjective: Awake and lowered. He states that he ate pancakes for breakfast and enjoyed them. Not causing any pain. He is having bowel movements and feels well. No fevers, chills, or tachycardia WBC 6200.  LFTs and lipase normal. Glucose 123.  CT scan (09/08/2013) reviewed. Acute on chronic pancreatitis with some bland-appearing fluid collections in the head of the pancreas. PD chronically dilated.  No air bubbles, signs of infection or necrosis. Splenomegaly noted. No pseudoaneurysms reported.  Objective: Vital signs in last 24 hours: Temp:  [97.3 F (36.3 C)-99 F (37.2 C)] 97.3 F (36.3 C) (06/22 0247) Pulse Rate:  [59-69] 59 (06/22 0247) Resp:  [14-18] 16 (06/22 0351) BP: (109-122)/(72-79) 116/78 mmHg (06/22 0247) SpO2:  [95 %-100 %] 98 % (06/22 0351) Last BM Date: 09/12/13  Intake/Output from previous day: 06/21 0701 - 06/22 0700 In: 840 [P.O.:840] Out: -  Intake/Output this shift:    General appearance: alert. Pleasant. Cooperative. Good spirits. No distress whatsoever. GI: abdomen is soft. Nontender. No mass. No bruit. Nondistended  Lab Results:   Recent Labs  09/11/13 0447 09/12/13 0510  WBC 7.4 6.2  HGB 11.9* 12.1*  HCT 35.7* 36.1*  PLT 100* 99*   BMET  Recent Labs  09/12/13 0510  NA 134*  K 4.3  CL 98  CO2 24  GLUCOSE 122*  BUN 8  CREATININE 0.91  CALCIUM 8.5   PT/INR No results found for this basename: LABPROT, INR,  in the last 72 hours ABG No results found for this basename: PHART, PCO2, PO2, HCO3,  in the last 72 hours  Studies/Results: No results found.  Anti-infectives: Anti-infectives   Start     Dose/Rate Route Frequency Ordered Stop   09/10/13 1430  imipenem-cilastatin (PRIMAXIN) 500 mg in sodium chloride 0.9 % 100 mL IVPB     500 mg 200 mL/hr over 30 Minutes Intravenous 3 times per day 09/10/13 1421     09/10/13 1200  piperacillin-tazobactam (ZOSYN) IVPB 3.375 g  Status:  Discontinued     3.375 g 12.5 mL/hr over 240 Minutes  Intravenous Every 8 hours 09/10/13 1108 09/10/13 1420      Assessment/Plan:  Acute on chronic alcoholic pancreatitis Small pancreatic head pseudocysts. Although he had some fevers early on, there was never any significant leukocytosis, and there is no radiographic evidence of infection or necrosis. Do not recommend any intervention at this point Status post pancreatic duct stent placement elsewhere. . This is to be evaluated and removed or replaced by GI.  There is no indication for acute surgical intervention. Recommend conservative measures and medical management by gastroenterology and internal medicine.  Surgery will sign off. Please reconsult if surgical issues arise.   LOS: 5 days    INGRAM,HAYWOOD M 09/13/2013

## 2013-09-13 NOTE — Progress Notes (Addendum)
Patient requesting to go off floor this am. Informed patient of policy.However, patient and son attempted to go off floor at 1600. Patient advised at the elevators that he could not leave the floor. Patient was calm and cooperative. Patient went back to his room without incident.

## 2013-09-14 ENCOUNTER — Encounter (HOSPITAL_COMMUNITY): Payer: Managed Care, Other (non HMO) | Admitting: Certified Registered Nurse Anesthetist

## 2013-09-14 ENCOUNTER — Encounter (HOSPITAL_COMMUNITY)
Admission: EM | Disposition: A | Discharge status: Home / Self Care | Payer: Self-pay | Source: Home / Self Care | Attending: Internal Medicine

## 2013-09-14 ENCOUNTER — Inpatient Hospital Stay (HOSPITAL_COMMUNITY): Payer: Managed Care, Other (non HMO) | Admitting: Certified Registered Nurse Anesthetist

## 2013-09-14 ENCOUNTER — Encounter (HOSPITAL_COMMUNITY): Payer: Self-pay | Admitting: *Deleted

## 2013-09-14 DIAGNOSIS — K269 Duodenal ulcer, unspecified as acute or chronic, without hemorrhage or perforation: Secondary | ICD-10-CM

## 2013-09-14 HISTORY — PX: GASTROINTESTINAL STENT REMOVAL: SHX6384

## 2013-09-14 HISTORY — PX: ESOPHAGOGASTRODUODENOSCOPY (EGD) WITH PROPOFOL: SHX5813

## 2013-09-14 SURGERY — ESOPHAGOGASTRODUODENOSCOPY (EGD) WITH PROPOFOL
Anesthesia: Monitor Anesthesia Care

## 2013-09-14 SURGERY — EGD (ESOPHAGOGASTRODUODENOSCOPY)
Anesthesia: Monitor Anesthesia Care

## 2013-09-14 MED ORDER — PANTOPRAZOLE SODIUM 40 MG PO TBEC
40.0000 mg | DELAYED_RELEASE_TABLET | Freq: Two times a day (BID) | ORAL | Status: DC
  Administered 2013-09-14 – 2013-09-15 (×3): 40 mg via ORAL
  Filled 2013-09-14 (×5): qty 1

## 2013-09-14 MED ORDER — KETAMINE HCL 10 MG/ML IJ SOLN
INTRAMUSCULAR | Status: AC
  Filled 2013-09-14: qty 1

## 2013-09-14 MED ORDER — ONDANSETRON HCL 4 MG/2ML IJ SOLN
INTRAMUSCULAR | Status: DC | PRN
  Administered 2013-09-14: 4 mg via INTRAVENOUS

## 2013-09-14 MED ORDER — MIDAZOLAM HCL 5 MG/5ML IJ SOLN
INTRAMUSCULAR | Status: DC | PRN
  Administered 2013-09-14: 2 mg via INTRAVENOUS

## 2013-09-14 MED ORDER — MIDAZOLAM HCL 2 MG/2ML IJ SOLN
INTRAMUSCULAR | Status: AC
  Filled 2013-09-14: qty 2

## 2013-09-14 MED ORDER — PROPOFOL INFUSION 10 MG/ML OPTIME
INTRAVENOUS | Status: DC | PRN
  Administered 2013-09-14: 100 ug/kg/min via INTRAVENOUS

## 2013-09-14 MED ORDER — GLYCOPYRROLATE 0.2 MG/ML IJ SOLN
INTRAMUSCULAR | Status: DC | PRN
  Administered 2013-09-14: 0.2 mg via INTRAVENOUS

## 2013-09-14 MED ORDER — GLYCOPYRROLATE 0.2 MG/ML IJ SOLN
INTRAMUSCULAR | Status: AC
  Filled 2013-09-14: qty 1

## 2013-09-14 MED ORDER — KETAMINE HCL 10 MG/ML IJ SOLN
INTRAMUSCULAR | Status: DC | PRN
  Administered 2013-09-14: 20 mg via INTRAVENOUS

## 2013-09-14 MED ORDER — LACTATED RINGERS IV SOLN
INTRAVENOUS | Status: DC
  Administered 2013-09-14: 1000 mL via INTRAVENOUS

## 2013-09-14 MED ORDER — LIDOCAINE HCL (CARDIAC) 20 MG/ML IV SOLN
INTRAVENOUS | Status: AC
  Filled 2013-09-14: qty 5

## 2013-09-14 MED ORDER — ONDANSETRON HCL 4 MG/2ML IJ SOLN
INTRAMUSCULAR | Status: AC
  Filled 2013-09-14: qty 2

## 2013-09-14 MED ORDER — LIDOCAINE HCL (CARDIAC) 20 MG/ML IV SOLN
INTRAVENOUS | Status: DC | PRN
  Administered 2013-09-14: 100 mg via INTRAVENOUS

## 2013-09-14 MED ORDER — BUTAMBEN-TETRACAINE-BENZOCAINE 2-2-14 % EX AERO
INHALATION_SPRAY | CUTANEOUS | Status: DC | PRN
  Administered 2013-09-14: 2 via TOPICAL

## 2013-09-14 MED ORDER — PROPOFOL 10 MG/ML IV BOLUS
INTRAVENOUS | Status: AC
  Filled 2013-09-14: qty 20

## 2013-09-14 SURGICAL SUPPLY — 14 items

## 2013-09-14 NOTE — Anesthesia Preprocedure Evaluation (Addendum)
Anesthesia Evaluation  Patient identified by MRN, date of birth, ID band Patient awake    Reviewed: Allergy & Precautions, H&P , NPO status , Patient's Chart, lab work & pertinent test results  Airway Mallampati: II TM Distance: >3 FB Neck ROM: Full    Dental no notable dental hx.    Pulmonary neg pulmonary ROS, Current Smoker,  breath sounds clear to auscultation  Pulmonary exam normal       Cardiovascular negative cardio ROS  Rhythm:Regular Rate:Normal     Neuro/Psych negative neurological ROS  negative psych ROS   GI/Hepatic negative GI ROS, Neg liver ROS,   Endo/Other  negative endocrine ROS  Renal/GU negative Renal ROS  negative genitourinary   Musculoskeletal negative musculoskeletal ROS (+)   Abdominal   Peds negative pediatric ROS (+)  Hematology negative hematology ROS (+) thrombocytopenia   Anesthesia Other Findings   Reproductive/Obstetrics negative OB ROS                          Anesthesia Physical Anesthesia Plan  ASA: II  Anesthesia Plan: MAC   Post-op Pain Management:    Induction:   Airway Management Planned: Natural Airway  Additional Equipment:   Intra-op Plan:   Post-operative Plan:   Informed Consent: I have reviewed the patients History and Physical, chart, labs and discussed the procedure including the risks, benefits and alternatives for the proposed anesthesia with the patient or authorized representative who has indicated his/her understanding and acceptance.   Dental advisory given  Plan Discussed with: CRNA  Anesthesia Plan Comments:         Anesthesia Quick Evaluation

## 2013-09-14 NOTE — Transfer of Care (Signed)
Immediate Anesthesia Transfer of Care Note  Patient: Lawrence Brock  Procedure(s) Performed: Procedure(s) (LRB): ESOPHAGOGASTRODUODENOSCOPY (EGD) WITH PROPOFOL (N/A) GASTROINTESTINAL STENT REMOVAL (N/A)  Patient Location: PACU  Anesthesia Type: MAC  Level of Consciousness: sedated, patient cooperative and responds to stimulation  Airway & Oxygen Therapy: Patient Spontanous Breathing and Patient connected to face mask oxgen  Post-op Assessment: Report given to PACU RN and Post -op Vital signs reviewed and stable  Post vital signs: Reviewed and stable  Complications: No apparent anesthesia complications

## 2013-09-14 NOTE — Op Note (Signed)
[facility] [place], [id number]   ENDOSCOPY PROCEDURE REPORT  PATIENT: Lawrence, Brock  MR#: [id number] BIRTHDATE: 1970-10-12 , 58  yrs. old GENDER: Male ENDOSCOPIST: Jerene Bears, MD REFERRED BY:  [facility] PROCEDURE DATE:  09/14/2013 PROCEDURE:  EGD w/ biopsy; EGD with removal of previously placed pancreatic duct stent ASA CLASS:     Class III INDICATIONS:  previously placed pancreatic duct stent, recent recurrent acute on chronic pancreatitis. MEDICATIONS: MAC sedation, administered by CRNA and See Anesthesia Report. TOPICAL ANESTHETIC: Cetacaine Spray  DESCRIPTION OF PROCEDURE: After the risks benefits and alternatives of the procedure were thoroughly explained, informed consent was obtained.  The Pentax Gastroscope [id number] endoscope was introduced through the mouth and advanced to the second portion of the duodenum. Without limitations.  The instrument was slowly withdrawn as the mucosa was fully examined.  ESOPHAGUS: The mucosa of the esophagus appeared normal.  STOMACH: Erosive gastritis (inflammation) was found in the gastric antrum.  Multiple biopsies were performed using cold forceps.   The stomach otherwise appeared normal.  DUODENUM: A single non-bleeding round and clean-based ulcer, measuring 10 x 16mm in size, was found in the duodenal bulb.   A previously placed plastic stent was found at the minor papilla. This was located on the opposite wall of the ampullary opening which was seen draining bile.  A snare was used to gently remove the stent which took minimal force.  Retroflexed views revealed no abnormalities.     The scope was then withdrawn from the patient and the procedure completed.  COMPLICATIONS: There were no complications.  ENDOSCOPIC IMPRESSION: 1.   The mucosa of the esophagus appeared normal 2.   Erosive gastritis (inflammation) was found in the gastric antrum; multiple biopsies 3.   Single  non-bleeding ulcer, measuring 10 x 4mm in size, was found in the duodenal bulb 4.   Plastic stent in minor papilla, removed with snare  RECOMMENDATIONS: 1.  BID PPI for 8 weeks 2.  Await biopsy results 3.  Follow-up of helicobacter pylori status, treat if indicated 4.  GI office follow-up after discharge with plans for repeat pancreatic imaging eSigned:  Jerene Bears, MD 09/14/2013 8:02 AM     CC:The Patient

## 2013-09-14 NOTE — Progress Notes (Signed)
Patient ID: Lawrence Brock, male   DOB: Jun 22, 1970, 43 y.o.   MRN: [id number] [facility] PROGRESS NOTE  Lawrence Brock WER:[id number] DOB: November 24, 1970 DOA: 09/08/2013 PCP: No primary provider on file.  Brief narrative: 43 y.o. male with a PMH of chronic pancreatitis / h/o pancreatic stent placement who was admitted 09/08/13 with 2 week history of progressively worsening abdominal discomfort. Upon initial evaluation in the ED, the patient had elevated lipase levels and CT findings were consistent with acute pancreatitis. He underwent pancreatic stent removal 09/14/2013.  Assessment/Plan:  Principal Problem:  Acute pancreatitis/pancreatic pseudocyst   Underwent pancreatic stent removal 09/14/2013. Biopsies obtained on this EGD, follow up results. For now continue protonix 40 mg PO BID. Continue imipenem. No surgery indicated per surgery recommendations.   Lipase normalized. Triglycerides not elevated  Continue diet as tolerated, antiemetics PRN Active Problems:  Fever   The patient spiked a fever 09/09/13. Blood cultures to date are negative  On imipenem until final culture results available.  Hypokalemia   Resolved with potassium added to IV fluids. 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 Hilarie Fredrickson)  Surgery   Procedures:  EGD with biopsy and pancreatic stent removal 09/14/2013    Antibiotics:  Zosyn 09/10/13--->09/10/13   Imipenem 09/10/13--->  HPI/Subjective: No acute overnight events.  Objective: Filed Vitals:   09/14/13 0704 09/14/13 0803 09/14/13 0813 09/14/13 0823  BP: 126/81 127/100 136/97 134/92  Pulse:  65 58 64  Temp: 98 F (36.7 C)     TempSrc: Oral     Resp: 16 17 13 20   Height:      Weight:      SpO2: 98% 100% 100% 98%    Intake/Output Summary (Last 24 hours) at 09/14/13 1322 Last data filed at 09/14/13 0830  Gross per 24 hour  Intake    300 ml  Output      0 ml  Net    300 ml    Exam:   General:  Pt is sleeping, no distress  Cardiovascular: Regular rate and rhythm, S1/S2, no murmurs  Respiratory: Clear to auscultation bilaterally, no wheezing, no crackles, no rhonchi  Abdomen: Soft, non tender, non distended, bowel sounds present  Extremities: No edema, pulses DP and PT palpable bilaterally  Neuro: Grossly nonfocal  Data Reviewed: Basic Metabolic Panel:  Recent Labs Lab 09/08/13 1531 09/08/13 1553 09/09/13 0520 09/12/13 0510  NA 139 140 138 134*  K 3.5* 3.7 3.7 4.3  CL 102 102 103 98  CO2 24  --  23 24  GLUCOSE 103* 105* 114* 122*  BUN 23 21 14 8   CREATININE 0.90 0.90 0.90 0.91  CALCIUM 8.9  --  8.6 8.5   Liver Function Tests:  Recent Labs Lab 09/08/13 1531 09/12/13 0510  AST 13 9  ALT 12 8  ALKPHOS 51 46  BILITOT 0.5 1.1  PROT 7.1 6.4  ALBUMIN 3.2* 2.6*    Recent Labs Lab  09/08/13 1531 09/09/13 0520 09/12/13 0510  LIPASE 114* 53 15   No results found for this basename: AMMONIA,  in the last 168 hours CBC:  Recent Labs Lab 09/08/13 1531 09/08/13 1553 09/09/13 0520 09/11/13 0447 09/12/13 0510  WBC 9.8  --  10.3 7.4 6.2  NEUTROABS 7.7  --   --   --   --   HGB 13.0 13.9 12.9* 11.9* 12.1*  HCT 38.9* 41.0 38.4* 35.7* 36.1*  MCV 90.7  --  90.4 89.5 88.3  PLT 145*  --  125* 100* 99*   Cardiac Enzymes: No results found for this basename: CKTOTAL, CKMB, CKMBINDEX, TROPONINI,  in the  last 168 hours BNP: No components found with this basename: POCBNP,  CBG: No results found for this basename: GLUCAP,  in the last 168 hours  Recent Results (from the past 240 hour(s))  CULTURE, BLOOD (ROUTINE X 2)     Status: None   Collection Time    09/09/13  6:29 PM      Result Value Ref Range Status   Specimen Description BLOOD RIGHT ARM   Final   Special Requests BOTTLES DRAWN AEROBIC AND ANAEROBIC 5CC   Final   Culture  Setup Time     Final   Value: 09/10/2013 04:40     Performed at [facility]   Culture     Final   Value:        BLOOD CULTURE RECEIVED NO GROWTH TO DATE CULTURE WILL BE HELD FOR 5 DAYS BEFORE ISSUING A FINAL NEGATIVE REPORT     Performed at [facility]   Report Status PENDING   Incomplete  CULTURE, BLOOD (ROUTINE X 2)     Status: None   Collection Time    09/09/13  6:47 PM      Result Value Ref Range Status   Specimen Description BLOOD LEFT ARM   Final   Special Requests BOTTLES DRAWN AEROBIC AND ANAEROBIC 4CC   Final   Culture  Setup Time     Final   Value: 09/10/2013 04:40     Performed at [facility]   Culture     Final   Value:        BLOOD CULTURE RECEIVED NO GROWTH TO DATE CULTURE WILL BE HELD FOR 5 DAYS BEFORE ISSUING A FINAL NEGATIVE REPORT     Performed at [facility]   Report Status PENDING   Incomplete     Studies: No results found.  Scheduled Meds: . docusate sodium  100 mg Oral BID  . imipenem-cilastatin  500 mg Intravenous 3 times per day  . pantoprazole  40 mg Oral BID AC   Continuous Infusions: . lactated ringers 1,000 mL (09/14/13 0712)

## 2013-09-15 ENCOUNTER — Encounter: Payer: Self-pay | Admitting: Internal Medicine

## 2013-09-15 ENCOUNTER — Encounter (HOSPITAL_COMMUNITY): Payer: Self-pay | Admitting: Internal Medicine

## 2013-09-15 LAB — CBC
HEMATOCRIT: 39.4 % (ref 39.0–52.0)
HEMOGLOBIN: 13 g/dL (ref 13.0–17.0)
MCH: 29.7 pg (ref 26.0–34.0)
MCHC: 33 g/dL (ref 30.0–36.0)
MCV: 90.2 fL (ref 78.0–100.0)
Platelets: 150 10*3/uL (ref 150–400)
RBC: 4.37 MIL/uL (ref 4.22–5.81)
RDW: 12.1 % (ref 11.5–15.5)
WBC: 4.3 10*3/uL (ref 4.0–10.5)

## 2013-09-15 MED ORDER — PANTOPRAZOLE SODIUM 40 MG PO TBEC: 40.0000 mg | DELAYED_RELEASE_TABLET | Freq: Two times a day (BID) | ORAL | Status: DC

## 2013-09-15 NOTE — Anesthesia Postprocedure Evaluation (Signed)
  Anesthesia Post-op Note  Patient: Lawrence Brock  Procedure(s) Performed: Procedure(s) (LRB): ESOPHAGOGASTRODUODENOSCOPY (EGD) WITH PROPOFOL (N/A) GASTROINTESTINAL STENT REMOVAL (N/A)  Patient Location: PACU  Anesthesia Type: MAC  Level of Consciousness: awake and alert   Airway and Oxygen Therapy: Patient Spontanous Breathing  Post-op Pain: mild  Post-op Assessment: Post-op Vital signs reviewed, Patient's Cardiovascular Status Stable, Respiratory Function Stable, Patent Airway and No signs of Nausea or vomiting  Last Vitals:  Filed Vitals:   09/15/13 0547  BP: 115/78  Pulse: 64  Temp: 36.6 C  Resp: 16    Post-op Vital Signs: stable   Complications: No apparent anesthesia complications

## 2013-09-15 NOTE — Progress Notes (Signed)
Patient resting in bed with eyes closed. Respirations are even and unlabored. Bed in low position call light in reach. No acute distress noted. Will continue to monitor patient throughout the night.

## 2013-09-15 NOTE — Progress Notes (Signed)
Patient given discharge instructions, and verbalized an understanding of all discharge instructions.  Patient agrees with discharge plan, and is being discharged in stable medical condition.  Patient ambulated with nurse tech out to transportation.  Patient's wife was called post discharge to give follow up appointment for [facility].  Durwin Nora RN

## 2013-09-15 NOTE — Discharge Summary (Signed)
Physician Discharge Summary  Lawrence Brock UYQ:[id number] DOB: Sep 30, 1970 DOA: 09/08/2013  PCP: No primary provider on file.  Admit date: 09/08/2013 Discharge date: 09/15/2013  Recommendations for Outpatient Follow-up:  1. continue Protonix 40 mg twice daily for at least 2 months after discharge. Followup with GI per scheduled appointment. EGD biopsy - no H.pylori, no evidence of malignancy. 2. followup in our clinic, community health and wellness per scheduled appointment.  Discharge Diagnoses:  Principal Problem:   Acute pancreatitis Active Problems:   Hypokalemia   Thrombocytopenia, unspecified   Normocytic anemia   Pancreatic pseudocyst   Unspecified constipation   Fever   Chronic pancreatitis   Duodenal ulcer    Discharge Condition: stable   Diet recommendation: as tolerated   History of present illness:  43 y.o. male with a PMH of chronic pancreatitis / h/o pancreatic stent placement who was admitted 09/08/13 with 2 week history of progressively worsening abdominal discomfort. Upon initial evaluation in the ED, the patient had elevated lipase levels and CT findings were consistent with acute pancreatitis. He underwent pancreatic stent removal 09/14/2013.   Assessment/Plan:   Principal Problem:  Acute pancreatitis/pancreatic pseudocyst  Underwent pancreatic stent removal 09/14/2013. Biopsies obtained on this EGD. For now continue protonix 40 mg PO BID. Continue imipenem until prior to discharge but abx not required after the discharge. No surgery indicated per surgery recommendations.  Lipase normalized. Triglycerides not elevated  Continue diet as tolerated, antiemetics PRN Active Problems:  Fever  The patient spiked a fever 09/09/13. Blood cultures to date are negative  On imipenem. It will be stopped prior to discharge. Blood cultures to date are negative.  Hypokalemia  Resolved with potassium added to IV fluids. Thrombocytopenia, anemia of chronic disease  Secondary  to bone marrow suppression from h/o alcohol use  Platelet count is 99 and hemoglobin 12.1  Stable, no current indications for transfusion  Unspecified constipation  Continue stool softener PO BID   DVT prophylaxis: SCD's bilaterally due to thrombocytopenia while pt is in hospital   Code Status: full code  Family Communication: plan of care discussed with the patient and his wife at the bedside  Disposition Plan: home today   Consultants:  GI (Dr. Hilarie Fredrickson)  Surgery  Procedures:  EGD with biopsy and pancreatic stent removal 09/14/2013  Antibiotics:  Zosyn 09/10/13--->09/10/13  Imipenem 09/10/13---> 09/15/2013   Signed:  Leisa Lenz, MD  [facility] 09/15/2013, 11:17 AM  Pager #: [phone]   Discharge Exam: Filed Vitals:   09/15/13 0547  BP: 115/78  Pulse: 64  Temp: 97.8 F (36.6 C)  Resp: 16   Filed Vitals:   09/14/13 0823 09/14/13 1447 09/14/13 2118 09/15/13 0547  BP: 134/92 116/78 114/77 115/78  Pulse: 64 56 55 64  Temp:  97.6 F (36.4 C) 98.4 F (36.9 C) 97.8 F (36.6 C)  TempSrc:  Oral Oral Oral  Resp: 20 18 18 16   Height:      Weight:      SpO2: 98% 97% 97% 96%    General: Pt is alert, follows commands appropriately, not in acute distress Cardiovascular: Regular rate and rhythm, S1/S2 +, no murmurs Respiratory: Clear to auscultation bilaterally, no wheezing, no crackles, no rhonchi Abdominal: Soft, non tender, non distended, bowel sounds +, no guarding Extremities: no edema, no cyanosis, pulses palpable bilaterally DP and PT Neuro: Grossly nonfocal  Discharge Instructions  Discharge Instructions   Call MD for:  difficulty breathing, headache or visual disturbances    Complete by:  As directed  Call MD for:  persistant dizziness or light-headedness    Complete by:  As directed      Call MD for:  persistant nausea and vomiting    Complete by:  As directed      Call MD for:  severe uncontrolled pain    Complete by:  As directed       Diet - low sodium heart healthy    Complete by:  As directed      Discharge instructions    Complete by:  As directed   1. continue Protonix 40 mg twice daily for at least 2 months after discharge. Followup with GI per scheduled appointment. 2. followup in our clinic, community health and wellness per scheduled appointment.     Increase activity slowly    Complete by:  As directed             Medication List         HYDROcodone-ibuprofen 7.5-200 MG per tablet  Commonly known as:  VICOPROFEN  Take 1 tablet by mouth every 8 (eight) hours as needed for moderate pain.     pantoprazole 40 MG tablet  Commonly known as:  PROTONIX  Take 1 tablet (40 mg total) by mouth 2 (two) times daily before a meal.     polyethylene glycol packet  Commonly known as:  MIRALAX / GLYCOLAX  Take 17 g by mouth daily.           Follow-up Information   Follow up with [facility]    . Schedule an appointment as soon as possible for a visit in 2 weeks.   Contact information:   [address] [place] [id number] [phone]      Follow up with PYRTLE, Lajuan Lines, MD. Schedule an appointment as soon as possible for a visit in 2 weeks. (Follow up appt after recent hospitalization; need repeat pancreatic imaging )    Specialty:  Gastroenterology   Contact information:   [address] [place] [address] [phone]        The results of significant diagnostics from this hospitalization (including imaging, microbiology, ancillary and laboratory) are listed below for reference.    Significant Diagnostic Studies: US Abdomen Complete  09/04/2013   CLINICAL DATA:  Epigastric abdominal pain and right upper quadrant abdominal tenderness.  EXAM: ULTRASOUND ABDOMEN COMPLETE  COMPARISON:  None.  FINDINGS: Gallbladder:  No gallstones or wall thickening visualized. No sonographic Murphy sign noted.  Common bile duct:  Diameter: 0.3 cm, within normal limits in caliber.   Liver:  No focal lesion identified. Within normal limits in parenchymal echogenicity.  IVC:  No abnormality visualized.  Pancreas:  Not visualized due to overlying structures.  Spleen:  Size and appearance within normal limits.  Right Kidney:  Length: 9.2 cm. Echogenicity within normal limits. No mass or hydronephrosis visualized.  Left Kidney:  Length: 10.4 cm. Echogenicity within normal limits. No hydronephrosis visualized. A 3.5 x 3.2 x 2.9 cm simple cyst is noted at the lower pole of the left kidney.  Abdominal aorta:  Not visualized on this study due to overlying bowel gas.  Other findings:  None.  IMPRESSION: 1. No acute abnormality seen in the abdomen. Evaluation is mildly suboptimal due to overlying bowel gas. 2. Left renal cyst noted.   Electronically Signed   By: Garald Balding M.D.   On: 09/04/2013 01:16   Ct Abdomen Pelvis W Contrast  09/08/2013   CLINICAL DATA:  Patient  diagnosed with pancreatitis on September 04, 2013. Now has worsened abdomen pain.  EXAM: CT ABDOMEN AND PELVIS WITH CONTRAST  TECHNIQUE: Multidetector CT imaging of the abdomen and pelvis was performed using the standard protocol following bolus administration of intravenous contrast.  CONTRAST:  138mL OMNIPAQUE IOHEXOL 300 MG/ML  SOLN  COMPARISON:  CT abdomen and pelvis April 09, 2005  FINDINGS: There is edema with multiloculated cystic structure in the pancreatic head and peripancreatic stranding consistent with patient's known pancreatitis with pancreatic pseudocyst measuring 2.7 x 4.8 cm. There is chronic pancreatic duct dilatation with changes of chronic pancreatitis.  There is mild diffuse fatty infiltration of liver. The gallbladder, spleen, adrenal glands are normal. There are cysts in bilateral kidneys, largest in the lower pole left kidney measuring 3.2 x 3.4 cm. There is no hydronephrosis bilaterally. There is atherosclerosis of the abdominal aorta without aneurysmal dilatation. There is no abdominal lymphadenopathy. There is  no small bowel obstruction or diverticulitis. The appendix is not seen but no inflammation is noted around the cecum.  Fluid-filled bladder is normal. There is no pelvic lymphadenopathy. No acute abnormality is identified within the visualized bones. There is atelectasis of the bilateral lung bases.  IMPRESSION: Acute superimposed on chronic pancreatitis. 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   Electronically Signed   By: Abelardo Diesel M.D.   On: 09/08/2013 19:11   Dg Abd Acute W/chest  09/08/2013   CLINICAL DATA:  Pain and fever.  EXAM: ACUTE ABDOMEN SERIES (ABDOMEN 2 VIEW & CHEST 1 VIEW)  COMPARISON:  Abdominal series 09/03/2013 .  FINDINGS: Mediastinum and hilar structures are normal. Mild basilar atelectasis. No pleural effusion or pneumothorax. Heart size normal.  Soft tissues of the abdomen are unremarkable. What appears to be a pancreatic duct stent is in stable position. Calcifications noted throughout the pancreas consistent with chronic pancreatitis. Large amount of stool in colon consistent with constipation. No bowel distention or free air.  IMPRESSION: 1. Constipation. 2. Pancreatic duct stent noted in stable position. Pancreatic calcifications consistent with chronic pancreatitis. 3. Bibasilar subsegmental atelectasis.   Electronically Signed   By: Marcello Moores  Register   On: 09/08/2013 17:05   Dg Abd Acute W/chest  09/03/2013   CLINICAL DATA:  Abdominal pain and back pain.  Nausea and vomiting.  EXAM: ACUTE ABDOMEN SERIES (ABDOMEN 2 VIEW & CHEST 1 VIEW)  COMPARISON:  Chest abdominal radiograph performed 05/18/2005, and 07/26/2005  FINDINGS: The lungs are well-aerated and clear. There is no evidence of focal opacification, pleural effusion or pneumothorax. The cardiomediastinal silhouette is within normal limits.  The visualized bowel gas pattern is unremarkable. Scattered stool and air are seen within the colon; there is no evidence of small bowel dilatation to suggest obstruction.  No free intra-abdominal air is identified on the provided upright view.  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  No acute osseous abnormalities are seen; the sacroiliac joints are unremarkable in appearance.  IMPRESSION: 1. Unremarkable bowel gas pattern; no free intra-abdominal air seen. 2. No acute cardiopulmonary process identified.   Electronically Signed   By: Garald Balding M.D.   On: 09/03/2013 23:41    Microbiology: Recent Results (from the past 240 hour(s))  CULTURE, BLOOD (ROUTINE X 2)     Status: None   Collection Time    09/09/13  6:29 PM      Result Value Ref Range Status   Specimen Description BLOOD RIGHT ARM   Final   Special Requests BOTTLES  DRAWN AEROBIC AND ANAEROBIC 5CC   Final   Culture  Setup Time     Final   Value: 09/10/2013 04:40     Performed at [facility]   Culture     Final   Value:        BLOOD CULTURE RECEIVED NO GROWTH TO DATE CULTURE WILL BE HELD FOR 5 DAYS BEFORE ISSUING A FINAL NEGATIVE REPORT     Performed at [facility]   Report Status PENDING   Incomplete  CULTURE, BLOOD (ROUTINE X 2)     Status: None   Collection Time    09/09/13  6:47 PM      Result Value Ref Range Status   Specimen Description BLOOD LEFT ARM   Final   Special Requests BOTTLES DRAWN AEROBIC AND ANAEROBIC 4CC   Final   Culture  Setup Time     Final   Value: 09/10/2013 04:40     Performed at [facility]   Culture     Final   Value:        BLOOD CULTURE RECEIVED NO GROWTH TO DATE CULTURE WILL BE HELD FOR 5 DAYS BEFORE ISSUING A FINAL NEGATIVE REPORT     Performed at [facility]   Report Status PENDING   Incomplete     Labs: Basic Metabolic Panel:  Recent Labs Lab 09/08/13 1531 09/08/13 1553 09/09/13 0520 09/12/13 0510  NA 139 140 138 134*  K 3.5* 3.7 3.7 4.3  CL 102 102 103 98  CO2 24  --  23 24  GLUCOSE 103* 105* 114* 122*  BUN 23 21 14 8   CREATININE 0.90 0.90 0.90 0.91  CALCIUM 8.9  --  8.6 8.5    Liver Function Tests:  Recent Labs Lab 09/08/13 1531 09/12/13 0510  AST 13 9  ALT 12 8  ALKPHOS 51 46  BILITOT 0.5 1.1  PROT 7.1 6.4  ALBUMIN 3.2* 2.6*    Recent Labs Lab 09/08/13 1531 09/09/13 0520 09/12/13 0510  LIPASE 114* 53 15   No results found for this basename: AMMONIA,  in the last 168 hours CBC:  Recent Labs Lab 09/08/13 1531 09/08/13 1553 09/09/13 0520 09/11/13 0447 09/12/13 0510 09/15/13 0515  WBC 9.8  --  10.3 7.4 6.2 4.3  NEUTROABS 7.7  --   --   --   --   --   HGB 13.0 13.9 12.9* 11.9* 12.1* 13.0  HCT 38.9* 41.0 38.4* 35.7* 36.1* 39.4  MCV 90.7  --  90.4 89.5 88.3 90.2  PLT 145*  --  125* 100* 99* 150   Cardiac Enzymes: No results found for this basename: CKTOTAL, CKMB, CKMBINDEX, TROPONINI,  in the last 168 hours BNP: BNP (last 3 results) No results found for this basename: PROBNP,  in the last 8760 hours CBG: No results found for this basename: GLUCAP,  in the last 168 hours  Time coordinating discharge: Over 30 minutes

## 2013-09-15 NOTE — Discharge Instructions (Signed)
Acute Pancreatitis °Acute pancreatitis is a disease in which the pancreas becomes suddenly irritated (inflamed). The pancreas is a large gland behind your stomach. The pancreas makes enzymes that help digest food. The pancreas also makes 2 hormones that help control your blood sugar. Acute pancreatitis happens when the enzymes attack and damage the pancreas. Most attacks last a couple of days and can cause serious problems. °HOME CARE °· Follow your doctor's diet instructions. You may need to avoid alcohol and limit fat in your diet. °· Eat small meals often. °· Drink enough fluids to keep your pee (urine) clear or pale yellow. °· Only take medicines as told by your doctor. °· Avoid drinking alcohol if it caused your disease. °· Do not smoke. °· Get plenty of rest. °· Check your blood sugar at home as told by your doctor. °· Keep all doctor visits as told. °GET HELP RIGHT AWAY IF:  °· You are unable to eat or keep fluids down. °· Your pain becomes severe. °· You have a fever or lasting symptoms for more than 2 to 3 days. °· You have a fever and your symptoms suddenly get worse. °· Your skin or the white part of your eyes turn yellow (jaundice). °· You throw up (vomit). °· You feel dizzy, or you pass out (faint). °· Your blood sugar is high (over 300 mg/dL). °· You do not get better as quickly as expected. °· You have new or worsening symptoms. °· You have lasting pain, weakness, or feel sick to your stomach (nauseous). °· You get better and then have another pain attack. °MAKE SURE YOU:  °· Understand these instructions. °· Will watch your condition. °· Will get help right away if you are not doing well or get worse. °Document Released: 08/28/2007 Document Revised: 09/10/2011 Document Reviewed: 06/20/2011 °[employer]® Patient Information ©2015 [employer]. This information is not intended to replace advice given to you by your health care provider. Make sure you discuss any questions you have with your health care  provider. ° °

## 2013-09-16 LAB — CULTURE, BLOOD (ROUTINE X 2)
Culture: NO GROWTH
Culture: NO GROWTH

## 2013-09-28 ENCOUNTER — Inpatient Hospital Stay: Payer: Managed Care, Other (non HMO) | Admitting: Internal Medicine

## 2013-10-23 DIAGNOSIS — I81 Portal vein thrombosis: Secondary | ICD-10-CM

## 2013-10-23 HISTORY — DX: Portal vein thrombosis: I81

## 2013-10-26 ENCOUNTER — Inpatient Hospital Stay: Payer: Managed Care, Other (non HMO) | Admitting: Internal Medicine

## 2014-01-23 DIAGNOSIS — K750 Abscess of liver: Secondary | ICD-10-CM

## 2014-01-23 DIAGNOSIS — K8309 Other cholangitis: Secondary | ICD-10-CM

## 2014-01-23 HISTORY — DX: Abscess of liver: K75.0

## 2014-01-23 HISTORY — DX: Other cholangitis: K83.09

## 2014-02-01 ENCOUNTER — Emergency Department (HOSPITAL_COMMUNITY)
Admission: EM | Admit: 2014-02-01 | Discharge: 2014-02-01 | Disposition: A | Discharge status: Home / Self Care | Payer: BC Managed Care – PPO | Attending: Emergency Medicine | Admitting: Emergency Medicine

## 2014-02-01 ENCOUNTER — Emergency Department (HOSPITAL_COMMUNITY): Payer: BC Managed Care – PPO

## 2014-02-01 ENCOUNTER — Encounter (HOSPITAL_COMMUNITY): Payer: Self-pay | Admitting: Emergency Medicine

## 2014-02-01 DIAGNOSIS — G8929 Other chronic pain: Secondary | ICD-10-CM | POA: Diagnosis not present

## 2014-02-01 DIAGNOSIS — Z79899 Other long term (current) drug therapy: Secondary | ICD-10-CM | POA: Diagnosis not present

## 2014-02-01 DIAGNOSIS — R1033 Periumbilical pain: Secondary | ICD-10-CM | POA: Diagnosis present

## 2014-02-01 DIAGNOSIS — R109 Unspecified abdominal pain: Secondary | ICD-10-CM | POA: Insufficient documentation

## 2014-02-01 DIAGNOSIS — Z9089 Acquired absence of other organs: Secondary | ICD-10-CM | POA: Insufficient documentation

## 2014-02-01 DIAGNOSIS — Z720 Tobacco use: Secondary | ICD-10-CM | POA: Insufficient documentation

## 2014-02-01 DIAGNOSIS — Z9889 Other specified postprocedural states: Secondary | ICD-10-CM | POA: Insufficient documentation

## 2014-02-01 DIAGNOSIS — Z8719 Personal history of other diseases of the digestive system: Secondary | ICD-10-CM | POA: Diagnosis not present

## 2014-02-01 DIAGNOSIS — R112 Nausea with vomiting, unspecified: Secondary | ICD-10-CM | POA: Insufficient documentation

## 2014-02-01 LAB — COMPREHENSIVE METABOLIC PANEL
ALT: 80 U/L — ABNORMAL HIGH (ref 0–53)
AST: 52 U/L — AB (ref 0–37)
Albumin: 2.8 g/dL — ABNORMAL LOW (ref 3.5–5.2)
Alkaline Phosphatase: 371 U/L — ABNORMAL HIGH (ref 39–117)
Anion gap: 11 (ref 5–15)
BILIRUBIN TOTAL: 4.6 mg/dL — AB (ref 0.3–1.2)
BUN: 17 mg/dL (ref 6–23)
CHLORIDE: 103 meq/L (ref 96–112)
CO2: 22 mEq/L (ref 19–32)
Calcium: 9 mg/dL (ref 8.4–10.5)
Creatinine, Ser: 0.86 mg/dL (ref 0.50–1.35)
GFR calc Af Amer: >90 mL/min (ref >90)
GFR calc non Af Amer: >90 mL/min (ref >90)
Glucose, Bld: 132 mg/dL — ABNORMAL HIGH (ref 70–99)
POTASSIUM: 4.1 meq/L (ref 3.7–5.3)
Sodium: 136 mEq/L — ABNORMAL LOW (ref 137–147)
TOTAL PROTEIN: 7.9 g/dL (ref 6.0–8.3)

## 2014-02-01 LAB — CBC WITH DIFFERENTIAL/PLATELET
BASOS ABS: 0 10*3/uL (ref 0.0–0.1)
Basophils Relative: 0 % (ref 0–1)
EOS ABS: 0.1 10*3/uL (ref 0.0–0.7)
Eosinophils Relative: 1 % (ref 0–5)
HCT: 35 % — ABNORMAL LOW (ref 39.0–52.0)
Hemoglobin: 11.7 g/dL — ABNORMAL LOW (ref 13.0–17.0)
Lymphocytes Relative: 6 % — ABNORMAL LOW (ref 12–46)
Lymphs Abs: 0.5 10*3/uL — ABNORMAL LOW (ref 0.7–4.0)
MCH: 29.8 pg (ref 26.0–34.0)
MCHC: 33.4 g/dL (ref 30.0–36.0)
MCV: 89.1 fL (ref 78.0–100.0)
Monocytes Absolute: 0.4 10*3/uL (ref 0.1–1.0)
Monocytes Relative: 5 % (ref 3–12)
NEUTROS ABS: 8.1 10*3/uL — AB (ref 1.7–7.7)
NEUTROS PCT: 88 % — AB (ref 43–77)
Platelets: 96 10*3/uL — ABNORMAL LOW (ref 150–400)
RBC: 3.93 MIL/uL — ABNORMAL LOW (ref 4.22–5.81)
RDW: 17.2 % — ABNORMAL HIGH (ref 11.5–15.5)
WBC: 9.2 10*3/uL (ref 4.0–10.5)

## 2014-02-01 LAB — URINE MICROSCOPIC-ADD ON

## 2014-02-01 LAB — URINALYSIS, ROUTINE W REFLEX MICROSCOPIC
Glucose, UA: NEGATIVE mg/dL
Hgb urine dipstick: NEGATIVE
Ketones, ur: NEGATIVE mg/dL
NITRITE: NEGATIVE
PH: 5.5 (ref 5.0–8.0)
Protein, ur: NEGATIVE mg/dL
SPECIFIC GRAVITY, URINE: 1.023 (ref 1.005–1.030)
UROBILINOGEN UA: 1 mg/dL (ref 0.0–1.0)

## 2014-02-01 LAB — LIPASE, BLOOD: LIPASE: 24 U/L (ref 11–59)

## 2014-02-01 MED ORDER — ONDANSETRON HCL 4 MG PO TABS: 4.0000 mg | ORAL_TABLET | Freq: Four times a day (QID) | ORAL | Status: DC

## 2014-02-01 MED ORDER — OXYCODONE-ACETAMINOPHEN 5-325 MG PO TABS: 1.0000 | ORAL_TABLET | ORAL | Status: DC | PRN

## 2014-02-01 MED ORDER — ONDANSETRON HCL 4 MG/2ML IJ SOLN
4.0000 mg | Freq: Once | INTRAMUSCULAR | Status: AC
  Administered 2014-02-01: 4 mg via INTRAVENOUS
  Filled 2014-02-01: qty 2

## 2014-02-01 MED ORDER — MORPHINE SULFATE 4 MG/ML IJ SOLN
4.0000 mg | Freq: Once | INTRAMUSCULAR | Status: AC
  Administered 2014-02-01: 4 mg via INTRAVENOUS
  Filled 2014-02-01: qty 1

## 2014-02-01 MED ORDER — OMEPRAZOLE 20 MG PO CPDR: 20.0000 mg | DELAYED_RELEASE_CAPSULE | Freq: Every day | ORAL | Status: DC

## 2014-02-01 NOTE — ED Notes (Signed)
Pt in radiology 

## 2014-02-01 NOTE — ED Provider Notes (Signed)
CSN: [id number]     Arrival date & time 02/01/14  4098 History   First MD Initiated Contact with Patient 02/01/14 0351     Chief Complaint  Patient presents with  . Abdominal Pain     (Consider location/radiation/quality/duration/timing/severity/associated sxs/prior Treatment) Patient is a 43 y.o. male presenting with abdominal pain. The history is provided by the patient. No language interpreter was used.  Abdominal Pain Pain location:  Periumbilical Pain quality: sharp   Pain severity:  Severe Onset quality:  Gradual Duration:  3 weeks Timing:  Intermittent Progression:  Worsening Associated symptoms: nausea and vomiting   Associated symptoms: no chills, no constipation, no diarrhea, no fever and no melena   Associated symptoms comment:  Patient with a history of pancreatitis, h/o pancreatitic stent, recent acute pancreatitis presents with abdominal pain for over 3 weeks, daily. He denies fever, hematemesis or bloody bowel movements. He did see an isolated incidence of what he thought was blood in his BM 2 weeks ago but none since.  Risk factors comment:  Former "heavy" drinker.    Past Medical History  Diagnosis Date  . Pancreatitis   . Chronic back pain   . Chronic pancreatitis 09/10/2013   Past Surgical History  Procedure Laterality Date  . Appendectomy    . Esophagogastroduodenoscopy (egd) with propofol N/A 09/14/2013    Procedure: ESOPHAGOGASTRODUODENOSCOPY (EGD) WITH PROPOFOL;  Surgeon: Jerene Bears, MD;  Location: [facility];  Service: Gastroenterology;  Laterality: N/A;  . Gastrointestinal stent removal N/A 09/14/2013    Procedure: GASTROINTESTINAL STENT REMOVAL;  Surgeon: Jerene Bears, MD;  Location: [facility];  Service: Gastroenterology;  Laterality: N/A;  pancreatic stent removal   History reviewed. No pertinent family history. History  Substance Use Topics  . Smoking status: Current Every Day Smoker -- 0.50 packs/day    Types: Cigarettes  . Smokeless  tobacco: Not on file  . Alcohol Use: No     Comment: former    Review of Systems  Constitutional: Negative for fever and chills.  HENT: Negative.   Respiratory: Negative.   Cardiovascular: Negative.   Gastrointestinal: Positive for nausea, vomiting and abdominal pain. Negative for diarrhea, constipation and melena.  Musculoskeletal: Negative.   Skin: Negative.   Neurological: Negative.       Allergies  Review of patient's allergies indicates no known allergies.  Home Medications   Prior to Admission medications   Medication Sig Start Date End Date Taking? Authorizing Provider  ibuprofen (ADVIL,MOTRIN) 200 MG tablet Take 400 mg by mouth every 6 (six) hours as needed for moderate pain.   Yes Historical Provider, MD  Multiple Vitamin (MULTIVITAMIN WITH MINERALS) TABS tablet Take 1 tablet by mouth daily.   Yes Historical Provider, MD  HYDROcodone-ibuprofen (VICOPROFEN) 7.5-200 MG per tablet Take 1 tablet by mouth every 8 (eight) hours as needed for moderate pain.    Historical Provider, MD  pantoprazole (PROTONIX) 40 MG tablet Take 1 tablet (40 mg total) by mouth 2 (two) times daily before a meal. 09/15/13   Robbie Lis, MD  polyethylene glycol (MIRALAX / Floria Raveling) packet Take 17 g by mouth daily. 09/04/13   Hannah Muthersbaugh, PA-C   BP 129/87 mmHg  Pulse 98  Temp(Src) 99.9 F (37.7 C) (Oral)  Resp 18  SpO2 98% Physical Exam  Constitutional: He is oriented to person, place, and time. He appears well-developed and well-nourished.  Uncomfortable appearing.  HENT:  Head: Normocephalic.  Neck: Normal range of motion. Neck supple.  Cardiovascular: Normal rate and  regular rhythm.   Pulmonary/Chest: Effort normal and breath sounds normal.  Abdominal: Soft. Bowel sounds are normal. There is tenderness. There is no rebound and no guarding.  Lower greater than upper abdominal tenderness to soft abdomen. BS present.   Musculoskeletal: Normal range of motion.  Neurological: He is  alert and oriented to person, place, and time.  Skin: Skin is warm and dry. No rash noted.  Psychiatric: He has a normal mood and affect.    ED Course  Procedures (including critical care time) Labs Review Labs Reviewed  CBC WITH DIFFERENTIAL - Abnormal; Notable for the following:    RBC 3.93 (*)    Hemoglobin 11.7 (*)    HCT 35.0 (*)    RDW 17.2 (*)    Neutrophils Relative % 88 (*)    Neutro Abs 8.1 (*)    Lymphocytes Relative 6 (*)    Lymphs Abs 0.5 (*)    All other components within normal limits  COMPREHENSIVE METABOLIC PANEL - Abnormal; Notable for the following:    Sodium 136 (*)    Glucose, Bld 132 (*)    Albumin 2.8 (*)    AST 52 (*)    ALT 80 (*)    Alkaline Phosphatase 371 (*)    Total Bilirubin 4.6 (*)    All other components within normal limits  LIPASE, BLOOD  URINALYSIS, ROUTINE W REFLEX MICROSCOPIC   Results for orders placed or performed during the hospital encounter of 02/01/14  CBC with Differential  Result Value Ref Range   WBC 9.2 4.0 - 10.5 K/uL   RBC 3.93 (L) 4.22 - 5.81 MIL/uL   Hemoglobin 11.7 (L) 13.0 - 17.0 g/dL   HCT 35.0 (L) 39.0 - 52.0 %   MCV 89.1 78.0 - 100.0 fL   MCH 29.8 26.0 - 34.0 pg   MCHC 33.4 30.0 - 36.0 g/dL   RDW 17.2 (H) 11.5 - 15.5 %   Platelets 96 (L) 150 - 400 K/uL   Neutrophils Relative % 88 (H) 43 - 77 %   Neutro Abs 8.1 (H) 1.7 - 7.7 K/uL   Lymphocytes Relative 6 (L) 12 - 46 %   Lymphs Abs 0.5 (L) 0.7 - 4.0 K/uL   Monocytes Relative 5 3 - 12 %   Monocytes Absolute 0.4 0.1 - 1.0 K/uL   Eosinophils Relative 1 0 - 5 %   Eosinophils Absolute 0.1 0.0 - 0.7 K/uL   Basophils Relative 0 0 - 1 %   Basophils Absolute 0.0 0.0 - 0.1 K/uL  Comprehensive metabolic panel  Result Value Ref Range   Sodium 136 (L) 137 - 147 mEq/L   Potassium 4.1 3.7 - 5.3 mEq/L   Chloride 103 96 - 112 mEq/L   CO2 22 19 - 32 mEq/L   Glucose, Bld 132 (H) 70 - 99 mg/dL   BUN 17 6 - 23 mg/dL   Creatinine, Ser 0.86 0.50 - 1.35 mg/dL   Calcium 9.0  8.4 - 10.5 mg/dL   Total Protein 7.9 6.0 - 8.3 g/dL   Albumin 2.8 (L) 3.5 - 5.2 g/dL   AST 52 (H) 0 - 37 U/L   ALT 80 (H) 0 - 53 U/L   Alkaline Phosphatase 371 (H) 39 - 117 U/L   Total Bilirubin 4.6 (H) 0.3 - 1.2 mg/dL   GFR calc non Af Amer >90 >90 mL/min   GFR calc Af Amer >90 >90 mL/min   Anion gap 11 5 - 15  Lipase, blood  Result  Value Ref Range   Lipase 24 11 - 59 U/L  Urinalysis, Routine w reflex microscopic  Result Value Ref Range   Color, Urine AMBER (A) YELLOW   APPearance CLEAR CLEAR   Specific Gravity, Urine 1.023 1.005 - 1.030   pH 5.5 5.0 - 8.0   Glucose, UA NEGATIVE NEGATIVE mg/dL   Hgb urine dipstick NEGATIVE NEGATIVE   Bilirubin Urine MODERATE (A) NEGATIVE   Ketones, ur NEGATIVE NEGATIVE mg/dL   Protein, ur NEGATIVE NEGATIVE mg/dL   Urobilinogen, UA 1.0 0.0 - 1.0 mg/dL   Nitrite NEGATIVE NEGATIVE   Leukocytes, UA TRACE (A) NEGATIVE  Urine microscopic-add on  Result Value Ref Range   Squamous Epithelial / LPF RARE RARE   WBC, UA 0-2 <3 WBC/hpf   Urine-Other MUCOUS PRESENT    Dg Abd Acute W/chest  02/01/2014   CLINICAL DATA:  Mid and lower abdominal pain.  Smoker.  EXAM: ACUTE ABDOMEN SERIES (ABDOMEN 2 VIEW & CHEST 1 VIEW)  COMPARISON:  Chest in two views abdomen and CT abdomen and pelvis 09/08/2013.  FINDINGS: Single view of the chest demonstrates clear lungs and normal heart size. No pneumothorax or pleural effusion.  Two views of the abdomen show no free intraperitoneal air. The bowel gas pattern is unremarkable. Calcification projecting just to the right of midline in the upper abdomen is consistent with calcifications within the pancreas seen on prior CT.  IMPRESSION: No acute finding chest or abdomen.   Electronically Signed   By: Inge Rise M.D.   On: 02/01/2014 05:15    Imaging Review No results found.   EKG Interpretation None      MDM   Final diagnoses:  Abdominal pain   Pain is much better with one dose of pain medication. No nausea.  Labs and imaging are reassuring. VSS. He feels comfortable with discharge home and f/u with [facility].  Chart reviewed. EGD done in June showed a very small duodenal ulcer. Patient cautioned about taking ibuprofen as he had been doing. Will resume PPI, provide pain medication, antiemetic and referral for follow up.    Dewaine Oats, PA-C 02/01/14 4327  Julianne Rice, MD 02/01/14 [phone]

## 2014-02-01 NOTE — ED Notes (Signed)
Pt is c/o lower abd pain  Pt is doubled over in chair stating he has a lot of pressure in his abdomen  Pt states he recently was diagnosed with acute pancreatitis but this pain is different  Pt states he has noticed some blood on the tissue after he wipes from having a bowel movement but is unsure if there is any in the stool  Denies vomiting

## 2014-02-01 NOTE — Discharge Instructions (Signed)

## 2014-02-01 NOTE — ED Notes (Signed)
Denies any nausea, vomiting, or diarrhea. Describes pain as cramping and intermittent for 3.5 weeks ago.

## 2014-02-08 ENCOUNTER — Emergency Department (HOSPITAL_COMMUNITY): Payer: BC Managed Care – PPO

## 2014-02-08 ENCOUNTER — Emergency Department (HOSPITAL_COMMUNITY)
Admission: EM | Admit: 2014-02-08 | Discharge: 2014-02-09 | Disposition: A | Discharge status: Home / Self Care | Payer: BC Managed Care – PPO | Attending: Emergency Medicine | Admitting: Emergency Medicine

## 2014-02-08 ENCOUNTER — Encounter (HOSPITAL_COMMUNITY): Payer: Self-pay

## 2014-02-08 DIAGNOSIS — Z720 Tobacco use: Secondary | ICD-10-CM | POA: Insufficient documentation

## 2014-02-08 DIAGNOSIS — Z9889 Other specified postprocedural states: Secondary | ICD-10-CM | POA: Diagnosis not present

## 2014-02-08 DIAGNOSIS — Z8719 Personal history of other diseases of the digestive system: Secondary | ICD-10-CM | POA: Insufficient documentation

## 2014-02-08 DIAGNOSIS — Z9089 Acquired absence of other organs: Secondary | ICD-10-CM | POA: Insufficient documentation

## 2014-02-08 DIAGNOSIS — N390 Urinary tract infection, site not specified: Secondary | ICD-10-CM | POA: Diagnosis not present

## 2014-02-08 DIAGNOSIS — Z79899 Other long term (current) drug therapy: Secondary | ICD-10-CM | POA: Diagnosis not present

## 2014-02-08 DIAGNOSIS — G8929 Other chronic pain: Secondary | ICD-10-CM | POA: Diagnosis not present

## 2014-02-08 DIAGNOSIS — Z8739 Personal history of other diseases of the musculoskeletal system and connective tissue: Secondary | ICD-10-CM | POA: Diagnosis not present

## 2014-02-08 DIAGNOSIS — R109 Unspecified abdominal pain: Secondary | ICD-10-CM | POA: Diagnosis present

## 2014-02-08 DIAGNOSIS — R10811 Right upper quadrant abdominal tenderness: Secondary | ICD-10-CM

## 2014-02-08 LAB — URINALYSIS, ROUTINE W REFLEX MICROSCOPIC
Glucose, UA: NEGATIVE mg/dL
Hgb urine dipstick: NEGATIVE
Ketones, ur: NEGATIVE mg/dL
Nitrite: POSITIVE — AB
PH: 5.5 (ref 5.0–8.0)
Protein, ur: 30 mg/dL — AB
SPECIFIC GRAVITY, URINE: 1.04 — AB (ref 1.005–1.030)
UROBILINOGEN UA: 2 mg/dL — AB (ref 0.0–1.0)

## 2014-02-08 LAB — COMPREHENSIVE METABOLIC PANEL
ALBUMIN: 2.5 g/dL — AB (ref 3.5–5.2)
ALK PHOS: 450 U/L — AB (ref 39–117)
ALT: 58 U/L — ABNORMAL HIGH (ref 0–53)
AST: 39 U/L — ABNORMAL HIGH (ref 0–37)
Anion gap: 16 — ABNORMAL HIGH (ref 5–15)
BUN: 17 mg/dL (ref 6–23)
CALCIUM: 9.2 mg/dL (ref 8.4–10.5)
CO2: 24 mEq/L (ref 19–32)
Chloride: 98 mEq/L (ref 96–112)
Creatinine, Ser: 0.78 mg/dL (ref 0.50–1.35)
GFR calc non Af Amer: >90 mL/min (ref >90)
GLUCOSE: 104 mg/dL — AB (ref 70–99)
Potassium: 4.1 mEq/L (ref 3.7–5.3)
Sodium: 138 mEq/L (ref 137–147)
TOTAL PROTEIN: 9 g/dL — AB (ref 6.0–8.3)
Total Bilirubin: 3.4 mg/dL — ABNORMAL HIGH (ref 0.3–1.2)

## 2014-02-08 LAB — CBC WITH DIFFERENTIAL/PLATELET
Basophils Absolute: 0 10*3/uL (ref 0.0–0.1)
Basophils Relative: 0 % (ref 0–1)
EOS ABS: 0 10*3/uL (ref 0.0–0.7)
Eosinophils Relative: 0 % (ref 0–5)
HCT: 37 % — ABNORMAL LOW (ref 39.0–52.0)
Hemoglobin: 12.5 g/dL — ABNORMAL LOW (ref 13.0–17.0)
LYMPHS PCT: 6 % — AB (ref 12–46)
Lymphs Abs: 0.9 10*3/uL (ref 0.7–4.0)
MCH: 29.9 pg (ref 26.0–34.0)
MCHC: 33.8 g/dL (ref 30.0–36.0)
MCV: 88.5 fL (ref 78.0–100.0)
MONO ABS: 0.9 10*3/uL (ref 0.1–1.0)
Monocytes Relative: 6 % (ref 3–12)
Neutro Abs: 14 10*3/uL — ABNORMAL HIGH (ref 1.7–7.7)
Neutrophils Relative %: 88 % — ABNORMAL HIGH (ref 43–77)
PLATELETS: 157 10*3/uL (ref 150–400)
RBC: 4.18 MIL/uL — ABNORMAL LOW (ref 4.22–5.81)
RDW: 15.7 % — ABNORMAL HIGH (ref 11.5–15.5)
WBC: 15.8 10*3/uL — ABNORMAL HIGH (ref 4.0–10.5)

## 2014-02-08 LAB — URINE MICROSCOPIC-ADD ON

## 2014-02-08 LAB — LIPASE, BLOOD: LIPASE: 13 U/L (ref 11–59)

## 2014-02-08 MED ORDER — MORPHINE SULFATE 4 MG/ML IJ SOLN
4.0000 mg | Freq: Once | INTRAMUSCULAR | Status: AC
  Administered 2014-02-08: 4 mg via INTRAVENOUS
  Filled 2014-02-08: qty 1

## 2014-02-08 MED ORDER — SODIUM CHLORIDE 0.9 % IV BOLUS (SEPSIS)
1000.0000 mL | Freq: Once | INTRAVENOUS | Status: AC
  Administered 2014-02-08: 1000 mL via INTRAVENOUS

## 2014-02-08 MED ORDER — CEFTRIAXONE SODIUM 1 G IJ SOLR
1.0000 g | Freq: Once | INTRAMUSCULAR | Status: AC
  Administered 2014-02-08: 1 g via INTRAVENOUS
  Filled 2014-02-08: qty 10

## 2014-02-08 NOTE — ED Notes (Signed)
PA at bedside.

## 2014-02-08 NOTE — ED Notes (Signed)
Pt has hx of pancreatitis.  Was seen by MD today for abdominal pain which he has had for 3 weeks.  Pt was told today that he had elevated WBC and to come to the ED.

## 2014-02-08 NOTE — ED Provider Notes (Signed)
CSN: [id number]     Arrival date & time 02/08/14  1719 History   First MD Initiated Contact with Patient 02/08/14 1914     Chief Complaint  Patient presents with  . Abdominal Pain  . Abnormal Lab     (Consider location/radiation/quality/duration/timing/severity/associated sxs/prior Treatment) HPI  Lawrence Brock is a 43 y.o. male with PMH of pancreatitis, chronic back pain presenting with abdominal pain that started this morning described as sharp and persistent. He noted this abdominal pain for 3 weeks. No associated nausea, vomiting, diarrhea. Patient notes subjective fevers and chills. Patient with 102F in ED. Patient presented to his primary care who did basic labs and found leukocytosis and told him to come here. Last bowel movement today and normal without melena or blood. Abdominal surgeries include appendectomy and pancreatic stent. Patient former heavy smoker he denies any NSAID use. Also with history of duodenal ulcer. Patient denies urinary complaints. Denies acute worsening of chronic back pain. Most recent CT in June which showed Acute superimposed on chronic pancreatitis. 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 Patient presented 1 week ago with similar complaints. No URI symptoms. Pt smoker with chronic cough that is not productive or with acute worsening.    Past Medical History  Diagnosis Date  . Pancreatitis   . Chronic back pain   . Chronic pancreatitis 09/10/2013   Past Surgical History  Procedure Laterality Date  . Appendectomy    . Esophagogastroduodenoscopy (egd) with propofol N/A 09/14/2013    Procedure: ESOPHAGOGASTRODUODENOSCOPY (EGD) WITH PROPOFOL;  Surgeon: Jerene Bears, MD;  Location: [facility];  Service: Gastroenterology;  Laterality: N/A;  . Gastrointestinal stent removal N/A 09/14/2013    Procedure: GASTROINTESTINAL STENT REMOVAL;  Surgeon: Jerene Bears, MD;  Location: [facility];  Service: Gastroenterology;  Laterality: N/A;  pancreatic  stent removal   History reviewed. No pertinent family history. History  Substance Use Topics  . Smoking status: Current Every Day Smoker -- 0.50 packs/day    Types: Cigarettes  . Smokeless tobacco: Not on file  . Alcohol Use: No     Comment: former    Review of Systems  Constitutional: Positive for fever. Negative for chills.  HENT: Negative for congestion and rhinorrhea.   Eyes: Negative for visual disturbance.  Respiratory: Negative for cough and shortness of breath.   Cardiovascular: Negative for chest pain and palpitations.  Gastrointestinal: Positive for abdominal pain. Negative for nausea, vomiting and diarrhea.  Genitourinary: Negative for dysuria and hematuria.  Musculoskeletal: Negative for back pain and gait problem.  Skin: Negative for rash.  Neurological: Negative for weakness and headaches.      Allergies  Review of patient's allergies indicates no known allergies.  Home Medications   Prior to Admission medications   Medication Sig Start Date End Date Taking? Authorizing Provider  acetaminophen (TYLENOL) 325 MG tablet Take 650 mg by mouth every 6 (six) hours as needed for fever (fever).   Yes Historical Provider, MD  Multiple Vitamin (MULTIVITAMIN WITH MINERALS) TABS tablet Take 1 tablet by mouth daily.   Yes Historical Provider, MD  omeprazole (PRILOSEC) 20 MG capsule Take 1 capsule (20 mg total) by mouth daily. 02/01/14  Yes Shari A Upstill, PA-C  ondansetron (ZOFRAN) 4 MG tablet Take 1 tablet (4 mg total) by mouth every 6 (six) hours. 02/01/14  Yes Shari A Upstill, PA-C  oxyCODONE-acetaminophen (PERCOCET/ROXICET) 5-325 MG per tablet Take 1-2 tablets by mouth every 4 (four) hours as needed for severe pain. 02/01/14  Yes Nehemiah Settle  A Upstill, PA-C  pantoprazole (PROTONIX) 40 MG tablet Take 1 tablet (40 mg total) by mouth 2 (two) times daily before a meal. 09/15/13  Yes Robbie Lis, MD  cefpodoxime (VANTIN) 100 MG tablet Take 1 tablet (100 mg total) by mouth 2 (two)  times daily. 02/09/14   Pura Spice, PA-C  HYDROcodone-ibuprofen (VICOPROFEN) 7.5-200 MG per tablet Take 1 tablet by mouth every 8 (eight) hours as needed for moderate pain.    Historical Provider, MD  ibuprofen (ADVIL,MOTRIN) 200 MG tablet Take 400 mg by mouth every 6 (six) hours as needed for moderate pain.    Historical Provider, MD  polyethylene glycol (MIRALAX / GLYCOLAX) packet Take 17 g by mouth daily. 09/04/13   Hannah Muthersbaugh, PA-C   BP 124/77 mmHg  Pulse 91  Temp(Src) 102.1 F (38.9 C) (Oral)  Resp 16  SpO2 97% Physical Exam  Constitutional: He appears well-developed and well-nourished. No distress.  HENT:  Head: Normocephalic and atraumatic.  Eyes: Conjunctivae and EOM are normal. Right eye exhibits no discharge. Left eye exhibits no discharge.  Cardiovascular: Normal rate, regular rhythm and normal heart sounds.   Pulmonary/Chest: Effort normal and breath sounds normal. No respiratory distress. He has no wheezes.  Abdominal: Soft. Bowel sounds are normal. He exhibits no distension.  Diffuse abdominal tenderness worse in RUQ with voluntary guarding but no rigidity or rebound. No CVA tenderness.  Neurological: He is alert. He exhibits normal muscle tone. Coordination normal.  Skin: Skin is warm and dry. He is not diaphoretic.  Nursing note and vitals reviewed.   ED Course  Procedures (including critical care time) Labs Review Labs Reviewed  CBC WITH DIFFERENTIAL - Abnormal; Notable for the following:    WBC 15.8 (*)    RBC 4.18 (*)    Hemoglobin 12.5 (*)    HCT 37.0 (*)    RDW 15.7 (*)    Neutrophils Relative % 88 (*)    Lymphocytes Relative 6 (*)    Neutro Abs 14.0 (*)    All other components within normal limits  COMPREHENSIVE METABOLIC PANEL - Abnormal; Notable for the following:    Glucose, Bld 104 (*)    Total Protein 9.0 (*)    Albumin 2.5 (*)    AST 39 (*)    ALT 58 (*)    Alkaline Phosphatase 450 (*)    Total Bilirubin 3.4 (*)    Anion gap 16  (*)    All other components within normal limits  URINALYSIS, ROUTINE W REFLEX MICROSCOPIC - Abnormal; Notable for the following:    Color, Urine ORANGE (*)    Specific Gravity, Urine 1.040 (*)    Bilirubin Urine SMALL (*)    Protein, ur 30 (*)    Urobilinogen, UA 2.0 (*)    Nitrite POSITIVE (*)    Leukocytes, UA SMALL (*)    All other components within normal limits  URINE MICROSCOPIC-ADD ON - Abnormal; Notable for the following:    Bacteria, UA FEW (*)    Casts HYALINE CASTS (*)    All other components within normal limits  URINE CULTURE  LIPASE, BLOOD    Imaging Review US Abdomen Complete  02/08/2014   CLINICAL DATA:  Right upper quadrant pain for 3 weeks, history of pancreatitis  EXAM: ULTRASOUND ABDOMEN COMPLETE  COMPARISON:  09/08/2013  FINDINGS: Gallbladder: Mild wall thickening is noted without evidence of cholelithiasis. This may be chronic in nature. It was not well appreciated on the prior CT examination  Common  bile duct: Diameter: 6 mm  Liver: No focal mass lesion is noted. Venous collaterals are noted surrounding the portal vein as noted on prior CT examination.  IVC: No abnormality visualized.  Pancreas: Pseudocyst is again noted at the head of the pancreas measuring 3.5 cm in dimension. Remainder the pancreas is not well visualized.  Spleen: Enlarged consistent with portal hypertension  Right Kidney: Length: 11.3 cm. Echogenicity within normal limits. No mass or hydronephrosis visualized.  Left Kidney: Length: 11.1 cm.  A 3 cm lower pole cyst is noted.  Abdominal aorta: No aneurysm visualized. The distal aorta is not well appreciated.  Other findings: No free fluid is seen.  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 Stable in appearance.  Changes of prior pseudocyst in the pancreas.  Stable in appearance.  Left renal cyst stable in appearance.  Mild thickening of gallbladder wall of uncertain  significance. No cholelithiasis is noted.   Electronically Signed   By: Inez Catalina M.D.   On: 02/08/2014 22:11     EKG Interpretation None      Meds given in ED:  Medications  morphine 4 MG/ML injection 4 mg (4 mg Intravenous Given 02/08/14 2029)  sodium chloride 0.9 % bolus 1,000 mL (0 mLs Intravenous Stopped 02/08/14 2152)  cefTRIAXone (ROCEPHIN) 1 g in dextrose 5 % 50 mL IVPB (0 g Intravenous Stopped 02/09/14 0038)  acetaminophen (TYLENOL) tablet 650 mg (650 mg Oral Given 02/09/14 0047)    Discharge Medication List as of 02/09/2014 12:44 AM    START taking these medications   Details  cefpodoxime (VANTIN) 100 MG tablet Take 1 tablet (100 mg total) by mouth 2 (two) times daily., Starting 02/09/2014, Until Discontinued, Print          MDM   Final diagnoses:  RUQ abdominal tenderness  UTI (lower urinary tract infection)  History of pancreatitis   Pt with history of chronic pancreatitis presenting with 3 week history of abdominal pain with acute worsening today. No other abdominal symptoms and pt with fever documented in ED. Other vitals stable. Pt with diffuse abdominal tenderness worse in RUQ. No signs of peritonitis. Labs with leukocytosis 15.8, mild anemia, bili elevated and transaminases but less than one week ago. Alk phos elevated 450. [place] with stable portal HTN with varices and mild thickening of gallbladder wall of uncertain significance. No cholelithiasis. Pt UA with infection. Consult general surgery. Spoke with Dr. Excell Seltzer at 10:48pm who was not concerned with thickening of gallbladder wall and thinks it is likely due to chronic pancreatitis not acute cholecystitis especially without cholelithiasis. Pt abdominal pain likely due to chronic pancreatitis and resolved with one dose of pain medication in ED. Pt to follow up with GI. Pt with fevers likely due to UTI. Treat as complicated UTI as above. Pt without other symptoms or signs to explain fever. tx with tylenol, home  pain medications and antiemetics as needed. Patient is  nontoxic, and in no acute distress. Patient is appropriate for outpatient management and is stable for discharge.  Discussed return precautions with patient. Discussed all results and patient verbalizes understanding and agrees with plan.  Case has been discussed with Dr. Tawnya Crook who agrees with the above plan and to discharge.       Pura Spice, PA-C 02/09/14 [place], PA-C 02/09/14 1441  Ernestina Patches, MD 02/10/14 [phone]

## 2014-02-08 NOTE — ED Notes (Signed)
Pt given urinal for sample but unable to go at this time. Will notify staff when able.

## 2014-02-09 LAB — URINE CULTURE
Colony Count: NO GROWTH
Culture: NO GROWTH

## 2014-02-09 MED ORDER — CEFPODOXIME PROXETIL 100 MG PO TABS: 100.0000 mg | ORAL_TABLET | Freq: Two times a day (BID) | ORAL | Status: DC

## 2014-02-09 MED ORDER — ACETAMINOPHEN 325 MG PO TABS
650.0000 mg | ORAL_TABLET | Freq: Once | ORAL | Status: AC
  Administered 2014-02-09: 650 mg via ORAL
  Filled 2014-02-09: qty 2

## 2014-02-09 NOTE — Discharge Instructions (Signed)
Return to the emergency room with worsening of symptoms, new symptoms or with symptoms that are concerning, especially fever not controled with tylenol, nausea, vomiting, unable to keep fluids down, severe abdominal pain. Treat with home pain medication. Do not operate machinery, drive or drink alcohol while taking narcotics or muscle relaxers. Please take all of your antibiotics until finished!   You may develop abdominal discomfort or diarrhea from the antibiotic.  You may help offset this with probiotics which you can buy or get in yogurt. Do not eat  or take the probiotics until 2 hours after your antibiotic.  Call to make appointment with the wellness center. Number provided above. Follow up in 2 days. Follow up with GI for your chronic pancreatitis and chronic abdominal pain. Call to make an appointment.   Please call your doctor for a followup appointment within 24-48 hours. When you talk to your doctor please let them know that you were seen in the emergency department and have them acquire all of your records so that they can discuss the findings with you and formulate a treatment plan to fully care for your new and ongoing problems. If you do not have a primary care provider please call the number below under ED resources to establish care with a provider and follow up.    Emergency Department Resource Guide 1) Find a Doctor and Pay Out of Pocket Although you won't have to find out who is covered by your insurance plan, it is a good idea to ask around and get recommendations. You will then need to call the office and see if the doctor you have chosen will accept you as a new patient and what types of options they offer for patients who are self-pay. Some doctors offer discounts or will set up payment plans for their patients who do not have insurance, but you will need to ask so you aren't surprised when you get to your appointment.  2) Contact Your Local Health Department Not all health  departments have doctors that can see patients for sick visits, but many do, so it is worth a call to see if yours does. If you don't know where your local health department is, you can check in your phone book. The CDC also has a tool to help you locate your state's health department, and many state websites also have listings of all of their local health departments.  3) Find a [facility] If your illness is not likely to be very severe or complicated, you may want to try a walk in clinic. These are popping up all over the country in pharmacies, drugstores, and shopping centers. They're usually staffed by nurse practitioners or physician assistants that have been trained to treat common illnesses and complaints. They're usually fairly quick and inexpensive. However, if you have serious medical issues or chronic medical problems, these are probably not your best option.  No Primary Care Doctor: - Call Health Connect at  [phone] - they can help you locate a primary care doctor that  accepts your insurance, provides certain services, etc. - Physician Referral Service- [phone]  Chronic Pain Problems: Organization         Address  Phone   Notes  [facility]  [phone] Patients need to be referred by their primary care doctor.   Medication Assistance: Organization         Address  Phone   Notes  [facility] Medication Assistance Program [address], [place] [address] [phone] --Must be a resident of [facility] -- Must have NO insurance coverage whatsoever (no Medicaid/ Medicare, etc.) -- The pt. MUST have a primary care doctor that directs their care regularly and follows them in the community   Med[facility]ist  [phone]   [employer]  [phone]    Agencies that provide inexpensive medical care: Organization         Address  Phone   Notes  [place]  [phone]   [facility]     [phone]   [facility] [place], [place] [address] [phone]   [facility] [address], [place] [phone]   Planned Parenthood    [phone]   [facility]    [phone]   [facility] [facility] [place] [address], [place] Phone:  [phone], Fax:  [phone] Hours of Operation:  9 am - 6 pm, M-F.  Also accepts Medicaid/Medicare and self-pay.  [facility] for [place] [address], [address], [place] Phone: [phone], Fax: [phone]. Hours of Operation:  8:30 am - 5:30 pm, M-F.  Also accepts Medicaid and self-pay.  [facility] [place] [address] [place] Phone: [phone]   [place], [place], [place] [phone], Ext. [phone] Mondays & Thursdays: 7-9 AM.  First 15 patients are seen on a first come, first serve basis.    [place] Providers:  Organization         Address  Phone   Notes  [facility] [address], [place] [phone] Also accepts self-pay patients.  [facility] [address], [place]  [phone]   [place], [address], [place] [phone]   [facility] [address], [place] [phone]   Lucianne Lei [address], [place]   [phone] Only accepts [place] Access [place] patients after they have their name applied to their card.   Self-Pay (no insurance) in [facility]:  Organization         Address  Phone   Notes  Sickle Cell Patients, [facility] [place] [phone]   [facility] Urgent Care [place] [phone]   [facility] [place]  [place], [place], [place] [phone]     [facility]/Dr. Osei-Bonsu  [address] [place] or [address], [place] [phone] Phone number for both [place] and [place] locations is the same.  Urgent Medical and [facility] [address], [place] [phone]   [facility] [address], [place] or [address] [phone]   [facility] [address], [place] [phone], phone; [phone], fax Sees patients 1st and 3rd Saturday of every month.  Must not qualify for public or private insurance (i.e. Medicaid, Medicare, [place] Health Choice, Veterans' Benefits)  Household income should be no more than 200% of the poverty level The clinic cannot treat you if you are pregnant or think you are pregnant  Sexually transmitted diseases are not treated at the clinic.  Dental Care: Organization         Address  Phone  Notes  [facility] Department of [facility] [place] [phone] Accepts children up to age 13 who are enrolled in [place] or [place]; pregnant women with a Medicaid card; and children who have applied for Medicaid or [place] Health Choice, but were declined, whose parents can pay a reduced fee at time of service.  [facility] of [facility]  [address], [place] [phone] Accepts children up to age 76 who are enrolled in [place] or [place]; pregnant women with a Medicaid card; and children who have applied for Medicaid or [place] Health Choice, but were declined, whose parents can pay a reduced fee at time of service.  [place] Adult [facility] Access PROGRAM  [place] [phone] Patients are seen by appointment only. Walk-ins are not accepted. [place] will see patients 35 years of age and older. Monday - Tuesday (8am-5pm) Most Wednesdays (8:30-5pm) $30 per visit,  cash only  [facility] Adult Dental Access PROGRAM  [address], [facility] [phone] Patients are seen by appointment only. Walk-ins are not accepted. [place] will see patients 31 years of age and older. One Wednesday Evening (Monthly: Volunteer Based).  $30 per visit, cash only  [place]  [phone] for adults; Children under age 65, call Graduate Pediatric Dentistry at [phone]. Children aged 42-14, please call [phone] to request a pediatric application.  Dental services are provided in all areas of dental care including fillings, crowns and bridges, complete and partial dentures, implants, gum treatment, root canals, and extractions. Preventive care is also provided. Treatment is provided to both adults and children. Patients are selected via a lottery and there is often a waiting list.   [facility] [address], [place]  [phone] [email or web address]   [facility] [address] [place], [place] [phone], Ext. [phone] Second and Fourth Thursday of each month, opens at 6:30 AM; Clinic ends at 9 AM.  Patients are seen on a first-come first-served basis, and a limited number are seen during each clinic.   [facility]  [address] Hillard Danker [place], [place] [phone]   Eligibility Requirements You must have lived in [place], [place], or [place] counties for at least the last three months.   You cannot be eligible for state or federal sponsored [employer], including [employer], [place], or [employer].   You generally cannot be eligible for healthcare insurance through your employer.    How to apply: Eligibility screenings are held every Tuesday and Wednesday afternoon from 1:00 pm until 4:00 pm. You do not need an appointment for the interview!  [facility] [address], [place], [place]   [place]   [place] Department  [place]  [phone]    Behavioral Health Resources in the Community: Intensive Outpatient Programs Organization         Address  Phone  Notes  [place]. [address] [place], [place] [phone]   [facility] Outpatient [address] [place], [place]   ADS: Alcohol & Drug Svcs [address] [place], [place]   [place] [address],  [place], [place] or [phone]   Substance Abuse Resources Organization         Address  Phone  Notes  Alcohol and Drug Services  [place]  [phone]   [facility] [place]  [phone]   Chinita Pester  [phone]   Residential & Outpatient Substance Abuse Program  [phone]   Psychological Services Organization         Address  Phone  Notes  [facility]  [place]  [phone]   [place] [address], [place] or [phone]    Mobile Crisis Teams Organization         Address  Phone  Notes  [facility], Mobile Crisis Care Unit  [phone]   Assertive Psychotherapeutic Services  [address] [place], [place]   [place] [address], [place] [phone]    Self-Help/Support Groups Organization         Address  Phone             Notes  [place]. of [place] - variety of support groups  [place] Call for more information  Narcotics Anonymous (NA), [facility] [address], [employer] [place]  2 meetings at this location   Special educational needs teacher         Address  Phone  Notes  [facility]idential [facility] [place],    [place]  [phone]   [facility]  [address] [place] [phone], [place], [place]    [place], [place] [phone] Admissions: 8am-3pm M-F  Incentives Substance [place] [address]    [place], [place] [phone]   [facility] [address] [place], [place], [place]   [facility] [address],  [place], [place]   [facility] - Intensive Outpatient [address], [place], [place]   [facility] ([address].,  [place], [place] [phone] or [phone]   [facility] (RTS) [address], [place], [place] Accepts Medicaid  [facility] [place] [address]  [place] [phone] Substance Abuse/Addiction Treatment   [facility] Organization         Address  Phone  Notes  [facility]  [phone]   Domenic Schwab, PhD [address] Arlis Porta [place], [place]   [phone] or [phone]   [place] [address]fr[address] [place], [place] [phone]   [facility] [address] [place], [place] [phone] Insurance/Medicaid/sponsorship through [facility] and Families [address] [place]                                    [place], [place] [phone] [place] [address], [place] [phone]    Dr. Adele Schilder  [phone]   [facility] [place] Dept. 1) [address], [place] 2) [place] 3)  [place] [address], [place] [phone]  [phone]   [place] [phone] or [phone] (After Hours)

## 2014-02-11 ENCOUNTER — Telehealth: Payer: Self-pay | Admitting: Internal Medicine

## 2014-02-11 NOTE — Telephone Encounter (Signed)
Spoke with patient's wife and she states patient has had stomach pain for awhile now. He went to ED earlier in the week. He is not having severe pain but she wants him to be seen. Scheduled with Alonza Bogus, PA on 02/15/14 at 2:30 PM.

## 2014-02-15 ENCOUNTER — Other Ambulatory Visit (INDEPENDENT_AMBULATORY_CARE_PROVIDER_SITE_OTHER): Payer: BC Managed Care – PPO

## 2014-02-15 ENCOUNTER — Ambulatory Visit (INDEPENDENT_AMBULATORY_CARE_PROVIDER_SITE_OTHER): Payer: BC Managed Care – PPO | Admitting: Gastroenterology

## 2014-02-15 ENCOUNTER — Encounter: Payer: Self-pay | Admitting: Gastroenterology

## 2014-02-15 VITALS — BP 120/64 | HR 88 | Ht 68.0 in | Wt 139.1 lb

## 2014-02-15 DIAGNOSIS — K861 Other chronic pancreatitis: Secondary | ICD-10-CM

## 2014-02-15 DIAGNOSIS — R945 Abnormal results of liver function studies: Principal | ICD-10-CM

## 2014-02-15 DIAGNOSIS — R7989 Other specified abnormal findings of blood chemistry: Secondary | ICD-10-CM

## 2014-02-15 DIAGNOSIS — R509 Fever, unspecified: Secondary | ICD-10-CM

## 2014-02-15 LAB — CBC WITH DIFFERENTIAL/PLATELET
Basophils Absolute: 0 10*3/uL (ref 0.0–0.1)
Basophils Relative: 0.1 % (ref 0.0–3.0)
EOS ABS: 0 10*3/uL (ref 0.0–0.7)
EOS PCT: 0.1 % (ref 0.0–5.0)
HEMATOCRIT: 30.2 % — AB (ref 39.0–52.0)
Hemoglobin: 10.1 g/dL — ABNORMAL LOW (ref 13.0–17.0)
LYMPHS ABS: 1 10*3/uL (ref 0.7–4.0)
Lymphocytes Relative: 5.3 % — ABNORMAL LOW (ref 12.0–46.0)
MCHC: 33.5 g/dL (ref 30.0–36.0)
MCV: 89.3 fl (ref 78.0–100.0)
MONO ABS: 0.8 10*3/uL (ref 0.1–1.0)
Monocytes Relative: 4.5 % (ref 3.0–12.0)
Neutro Abs: 16.7 10*3/uL — ABNORMAL HIGH (ref 1.4–7.7)
Neutrophils Relative %: 90 % — ABNORMAL HIGH (ref 43.0–77.0)
PLATELETS: 276 10*3/uL (ref 150.0–400.0)
RBC: 3.38 Mil/uL — ABNORMAL LOW (ref 4.22–5.81)
RDW: 16 % — ABNORMAL HIGH (ref 11.5–15.5)
WBC: 18.6 10*3/uL — AB (ref 4.0–10.5)

## 2014-02-15 LAB — COMPREHENSIVE METABOLIC PANEL
ALK PHOS: 982 U/L — AB (ref 39–117)
ALT: 90 U/L — ABNORMAL HIGH (ref 0–53)
AST: 65 U/L — AB (ref 0–37)
Albumin: 2.1 g/dL — ABNORMAL LOW (ref 3.5–5.2)
BILIRUBIN TOTAL: 3.7 mg/dL — AB (ref 0.2–1.2)
BUN: 13 mg/dL (ref 6–23)
CO2: 22 mEq/L (ref 19–32)
Calcium: 8 mg/dL — ABNORMAL LOW (ref 8.4–10.5)
Chloride: 98 mEq/L (ref 96–112)
Creatinine, Ser: 0.8 mg/dL (ref 0.4–1.5)
GFR: 119.22 mL/min (ref >60.00)
Glucose, Bld: 148 mg/dL — ABNORMAL HIGH (ref 70–99)
Potassium: 3.5 mEq/L (ref 3.5–5.1)
Sodium: 131 mEq/L — ABNORMAL LOW (ref 135–145)
Total Protein: 7.8 g/dL (ref 6.0–8.3)

## 2014-02-15 LAB — LIPASE: Lipase: 20 U/L (ref 11.0–59.0)

## 2014-02-15 NOTE — Patient Instructions (Signed)
Your physician has requested that you go to the basement for the following lab work before leaving today: CBC CMET LIPASE ____________________________________________________________________________________________________________________________________________________________________________________________ Lawrence Brock have been scheduled for a CT scan of the abdomen and pelvis at [facility] (1st floor of the hospital).   You are scheduled on 02-16-2014 at 10:30 am. You should arrive 15 minutes prior to your appointment time for registration. Please follow the written instructions below on the day of your exam:  WARNING: IF YOU ARE ALLERGIC TO IODINE/X-RAY DYE, PLEASE NOTIFY RADIOLOGY IMMEDIATELY AT [phone]! YOU WILL BE GIVEN A 13 HOUR PREMEDICATION PREP.  1) Do not eat or drink anything after 6:30 am (4 hours prior to your test) 2) You have been given 2 bottles of oral contrast to drink. The solution may taste better if refrigerated, but do NOT add ice or any other liquid to this solution. Shake well before drinking.    Drink 1 bottle of contrast @ 8:30 am (2 hours prior to your exam)  Drink 1 bottle of contrast @ 9:30 am (1 hour prior to your exam)  You may take any medications as prescribed with a small amount of water except for the following: Metformin, Glucophage, Glucovance, Avandamet, Riomet, Fortamet, Actoplus Met, Janumet, Glumetza or Metaglip. The above medications must be held the day of the exam AND 48 hours after the exam.  The purpose of you drinking the oral contrast is to aid in the visualization of your intestinal tract. The contrast solution may cause some diarrhea. Before your exam is started, you will be given a small amount of fluid to drink. Depending on your individual set of symptoms, you may also receive an intravenous injection of x-ray contrast/dye. Plan on being at [facility] 30 minutes or long, depending on the type of exam you are having  performed.  This test typically takes 30-45 minutes to complete.  If you have any questions regarding your exam or if you need to reschedule, you may call the CT department at [phone].  ________________________________________________________________________

## 2014-02-15 NOTE — Progress Notes (Addendum)
     02/15/2014 Lawrence Brock [id number] Feb 03, 1971   History of Present Illness:  This is a 43 year old male who is known remotely to Dr. [name].  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  At that time he had a pancreatitic stent in place that was placed on [facility] and had been in place for several years.  It was removed with snare during EGD on 6/23 by Dr. Hilarie Fredrickson at which time he was also found to have single non-bleeding ulcer in the duodenal bulb.  Biopsies were negative for Hpylori.  He was supposed to follow-up for repeat imaging in 6-8 weeks but failed to do so.  Now he presents today with complaints of intermittent upper abdominal pains and fevers up to 101.  Is drinking a lot of fluids but not eating much.  Denies any ETOH since May, prior to his hospitalization.  He has been taking tylenol for fever and no more than 1 Oxycodone per day for severe pain.  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 Stable in appearance.  Changes of prior pseudocyst in the pancreas. Stable in appearance.  Left renal cyst stable in appearance.  Mild thickening of gallbladder wall of uncertain significance. No cholelithiasis is noted."  CBC showed elevated WBC count at 15., normal lipase.  LFT's were elevated at AST 39, ALT 58, ALP 450, and total bili 3.4.  They were elevated two weeks ago as well, but were completely normal back during hospitalization in June.   Current Medications, Allergies, Past Medical History, Past Surgical History, Family History and Social History were reviewed in [employer] record.   Physical Exam: BP 120/64 mmHg  Pulse 88  Ht 5\' 8"  (1.727 m)  Wt 139 lb 2 oz (63.107 kg)   BMI 21.16 kg/m2 General: Well developed Asian male in no acute distress Head: Normocephalic and atraumatic Eyes:  Scleral icterus noted. Ears: Normal auditory acuity Lungs: Clear throughout to auscultation Heart: Regular rate and rhythm Abdomen: Soft, non-distended.  Normal bowel sounds.  Non-tender. Musculoskeletal: Symmetrical with no gross deformities  Extremities: No edema  Neurological: Alert oriented x 4, grossly non-focal Psychological:  Alert and cooperative. Normal mood and affect  Assessment and Recommendations: -43 year old male with history of chronic pancreatitis with acute pancreatitis and pseudocysts in 08/2013 (due to ETOH).  Now presenting with elevated LFT's, fever, intermittent abdominal pain, and findings on ultrasound c/w cirrhosis/portal HTN.    *Will recheck CBC, CMP, and lipase today.  Will check CT of the abdomen and pelvis with contrast as well.

## 2014-02-16 ENCOUNTER — Encounter (HOSPITAL_COMMUNITY): Payer: Self-pay | Admitting: *Deleted

## 2014-02-16 ENCOUNTER — Ambulatory Visit (HOSPITAL_COMMUNITY)
Admission: RE | Admit: 2014-02-16 | Discharge: 2014-02-16 | Disposition: A | Discharge status: Home / Self Care | Payer: BC Managed Care – PPO | Source: Ambulatory Visit | Attending: Gastroenterology | Admitting: Gastroenterology

## 2014-02-16 ENCOUNTER — Inpatient Hospital Stay (HOSPITAL_COMMUNITY)
Admission: AD | Admit: 2014-02-16 | Discharge: 2014-02-23 | DRG: 408 | Disposition: A | Discharge status: Home / Self Care | Payer: BC Managed Care – PPO | Source: Ambulatory Visit | Attending: Internal Medicine | Admitting: Internal Medicine

## 2014-02-16 ENCOUNTER — Telehealth: Payer: Self-pay | Admitting: *Deleted

## 2014-02-16 DIAGNOSIS — Z6821 Body mass index (BMI) 21.0-21.9, adult: Secondary | ICD-10-CM | POA: Diagnosis not present

## 2014-02-16 DIAGNOSIS — D62 Acute posthemorrhagic anemia: Secondary | ICD-10-CM | POA: Diagnosis not present

## 2014-02-16 DIAGNOSIS — K8033 Calculus of bile duct with acute cholangitis with obstruction: Secondary | ICD-10-CM | POA: Diagnosis present

## 2014-02-16 DIAGNOSIS — G8929 Other chronic pain: Secondary | ICD-10-CM | POA: Diagnosis present

## 2014-02-16 DIAGNOSIS — F17210 Nicotine dependence, cigarettes, uncomplicated: Secondary | ICD-10-CM | POA: Diagnosis present

## 2014-02-16 DIAGNOSIS — K831 Obstruction of bile duct: Secondary | ICD-10-CM

## 2014-02-16 DIAGNOSIS — R945 Abnormal results of liver function studies: Principal | ICD-10-CM

## 2014-02-16 DIAGNOSIS — K861 Other chronic pancreatitis: Secondary | ICD-10-CM

## 2014-02-16 DIAGNOSIS — K750 Abscess of liver: Secondary | ICD-10-CM | POA: Diagnosis present

## 2014-02-16 DIAGNOSIS — E440 Moderate protein-calorie malnutrition: Secondary | ICD-10-CM | POA: Diagnosis present

## 2014-02-16 DIAGNOSIS — K863 Pseudocyst of pancreas: Secondary | ICD-10-CM | POA: Diagnosis present

## 2014-02-16 DIAGNOSIS — R7989 Other specified abnormal findings of blood chemistry: Secondary | ICD-10-CM

## 2014-02-16 DIAGNOSIS — D500 Iron deficiency anemia secondary to blood loss (chronic): Secondary | ICD-10-CM | POA: Diagnosis present

## 2014-02-16 DIAGNOSIS — K859 Acute pancreatitis, unspecified: Secondary | ICD-10-CM | POA: Diagnosis present

## 2014-02-16 DIAGNOSIS — K766 Portal hypertension: Secondary | ICD-10-CM | POA: Diagnosis present

## 2014-02-16 DIAGNOSIS — E876 Hypokalemia: Secondary | ICD-10-CM | POA: Diagnosis not present

## 2014-02-16 DIAGNOSIS — K8309 Other cholangitis: Secondary | ICD-10-CM

## 2014-02-16 DIAGNOSIS — R509 Fever, unspecified: Secondary | ICD-10-CM | POA: Diagnosis present

## 2014-02-16 DIAGNOSIS — K8050 Calculus of bile duct without cholangitis or cholecystitis without obstruction: Secondary | ICD-10-CM | POA: Diagnosis present

## 2014-02-16 DIAGNOSIS — Z79899 Other long term (current) drug therapy: Secondary | ICD-10-CM | POA: Diagnosis not present

## 2014-02-16 DIAGNOSIS — T85590A Other mechanical complication of bile duct prosthesis, initial encounter: Secondary | ICD-10-CM

## 2014-02-16 LAB — CBC
HCT: 32.5 % — ABNORMAL LOW (ref 39.0–52.0)
HEMATOCRIT: 24.2 % — AB (ref 39.0–52.0)
HEMOGLOBIN: 7.9 g/dL — AB (ref 13.0–17.0)
HEMOGLOBIN: 10.7 g/dL — AB (ref 13.0–17.0)
MCH: 29.2 pg (ref 26.0–34.0)
MCH: 29.6 pg (ref 26.0–34.0)
MCHC: 32.6 g/dL (ref 30.0–36.0)
MCHC: 32.9 g/dL (ref 30.0–36.0)
MCV: 89.3 fL (ref 78.0–100.0)
MCV: 90 fL (ref 78.0–100.0)
Platelets: 221 10*3/uL (ref 150–400)
Platelets: 214 10*3/uL (ref 150–400)
RBC: 2.71 MIL/uL — ABNORMAL LOW (ref 4.22–5.81)
RBC: 3.61 MIL/uL — AB (ref 4.22–5.81)
RDW: 15 % (ref 11.5–15.5)
RDW: 15.2 % (ref 11.5–15.5)
WBC: 16.5 10*3/uL — AB (ref 4.0–10.5)
WBC: 14.8 10*3/uL — AB (ref 4.0–10.5)

## 2014-02-16 LAB — ABO/RH: ABO/RH(D): A POS

## 2014-02-16 LAB — TYPE AND SCREEN
ABO/RH(D): A POS
Antibody Screen: NEGATIVE

## 2014-02-16 LAB — TSH: TSH: 0.841 u[IU]/mL (ref 0.350–4.500)

## 2014-02-16 LAB — CREATININE, SERUM
Creatinine, Ser: 0.84 mg/dL (ref 0.50–1.35)
GFR calc Af Amer: >90 mL/min (ref >90)

## 2014-02-16 LAB — MAGNESIUM: Magnesium: 2.2 mg/dL (ref 1.5–2.5)

## 2014-02-16 MED ORDER — SODIUM CHLORIDE 0.9 % IV SOLN
8.0000 mg/h | INTRAVENOUS | Status: DC
  Administered 2014-02-16 – 2014-02-18 (×4): 8 mg/h via INTRAVENOUS
  Filled 2014-02-16 (×8): qty 80

## 2014-02-16 MED ORDER — SODIUM CHLORIDE 0.9 % IV BOLUS (SEPSIS)
500.0000 mL | Freq: Once | INTRAVENOUS | Status: AC
  Administered 2014-02-16: 500 mL via INTRAVENOUS

## 2014-02-16 MED ORDER — ONDANSETRON HCL 4 MG PO TABS: 4.0000 mg | ORAL_TABLET | Freq: Four times a day (QID) | ORAL | Status: DC | PRN

## 2014-02-16 MED ORDER — IOHEXOL 300 MG/ML  SOLN
100.0000 mL | Freq: Once | INTRAMUSCULAR | Status: AC | PRN
  Administered 2014-02-16: 100 mL via INTRAVENOUS

## 2014-02-16 MED ORDER — PIPERACILLIN-TAZOBACTAM 3.375 G IVPB
3.3750 g | Freq: Three times a day (TID) | INTRAVENOUS | Status: DC
  Administered 2014-02-16 – 2014-02-23 (×21): 3.375 g via INTRAVENOUS
  Filled 2014-02-16 (×23): qty 50

## 2014-02-16 MED ORDER — HYDROMORPHONE HCL 1 MG/ML IJ SOLN
0.5000 mg | INTRAMUSCULAR | Status: DC | PRN
  Administered 2014-02-17 – 2014-02-20 (×9): 0.5 mg via INTRAVENOUS
  Filled 2014-02-16 (×9): qty 1

## 2014-02-16 MED ORDER — SODIUM CHLORIDE 0.9 % IV SOLN
INTRAVENOUS | Status: DC
  Administered 2014-02-16 – 2014-02-18 (×2): via INTRAVENOUS

## 2014-02-16 MED ORDER — ONDANSETRON HCL 4 MG/2ML IJ SOLN: 4.0000 mg | Freq: Four times a day (QID) | INTRAMUSCULAR | Status: DC | PRN

## 2014-02-16 MED ORDER — DOCUSATE SODIUM 100 MG PO CAPS
100.0000 mg | ORAL_CAPSULE | Freq: Two times a day (BID) | ORAL | Status: DC
  Administered 2014-02-16 – 2014-02-18 (×4): 100 mg via ORAL
  Filled 2014-02-16 (×15): qty 1

## 2014-02-16 MED ORDER — PANTOPRAZOLE SODIUM 40 MG IV SOLR
40.0000 mg | Freq: Two times a day (BID) | INTRAVENOUS | Status: DC
  Administered 2014-02-19 – 2014-02-23 (×7): 40 mg via INTRAVENOUS
  Filled 2014-02-16 (×9): qty 40

## 2014-02-16 MED ORDER — LEVALBUTEROL HCL 0.63 MG/3ML IN NEBU: 0.6300 mg | INHALATION_SOLUTION | Freq: Four times a day (QID) | RESPIRATORY_TRACT | Status: DC | PRN

## 2014-02-16 MED ORDER — SODIUM CHLORIDE 0.9 % IJ SOLN
3.0000 mL | Freq: Two times a day (BID) | INTRAMUSCULAR | Status: DC
  Administered 2014-02-18 – 2014-02-20 (×5): 3 mL via INTRAVENOUS

## 2014-02-16 MED ORDER — ENOXAPARIN SODIUM 40 MG/0.4ML ~~LOC~~ SOLN
40.0000 mg | SUBCUTANEOUS | Status: DC
  Administered 2014-02-16 – 2014-02-17 (×2): 40 mg via SUBCUTANEOUS
  Filled 2014-02-16 (×3): qty 0.4

## 2014-02-16 MED ORDER — SODIUM CHLORIDE 0.9 % IV SOLN
80.0000 mg | Freq: Once | INTRAVENOUS | Status: AC
  Administered 2014-02-16: 80 mg via INTRAVENOUS
  Filled 2014-02-16: qty 80

## 2014-02-16 MED ORDER — IBUPROFEN 800 MG PO TABS
800.0000 mg | ORAL_TABLET | Freq: Four times a day (QID) | ORAL | Status: DC | PRN
  Administered 2014-02-16 – 2014-02-18 (×3): 800 mg via ORAL
  Filled 2014-02-16 (×4): qty 1

## 2014-02-16 NOTE — Consult Note (Signed)
Referring Provider:  Primary Care Physician:  [name], MD Primary Gastroenterologist:  Dr. [name]  Reason for Consultation: pancreatitis, hepatic abscess  HPI: Lawrence Brock is a 43 y.o. male with a history of chronic pancreatitis.  His first episode was in 2007, and was felt to be secondary to ETOH. He was admitted in June 2015 with pancreatitis. His lipase was 114 and CT findings consistent with acute or chronic pancreatitis with a 2.7 x 4.8 cm pseudocyst, no leukocytosis. Pt reported that he had had a pancreatic stent placed at [facility] 6-7 years ago.He had an EGD by Dr Hilarie Fredrickson in June 2015 at which time he was noted to have gastritis, a single nonbleeding ulcer in the duodenal bulb, and his pancreatic stent was removed.Biopsies were negative for H pylori. The pt was instructed to follow up with Dr Hilarie Fredrickson for repeat imaging, but never did.He says he has never felt well since then, and in fact has missed the past 3 weeks of work because he has felt so poorly.  He presented to the GI office yesterday with complaints of intermittent upper abdominal pain and fevr up to 101. He was drinking fluids but not eating much. He denies use of alcohol since May. He has been using tylenol for fever, and no more than one oxycodone per day for severe pain. 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 Stable in appearance.  Changes of prior pseudocyst in the pancreas. Stable in appearance.  Left renal cyst stable in appearance.  Mild thickening of gallbladder wall of uncertain significance. No cholelithiasis is noted."  CBC showed elevated WBC count at 15., normal lipase. LFT's were elevated at AST 39, ALT 58, ALP 450, and total bili 3.4. They were elevated two weeks ago as well, but were completely normal back  during hospitalization in June.  At his office visit yesterday, he was sent for a cbc, CMP, lipase and CT of the abdomen and pelvis.WBC was 18.6. T bili 3.7, alk phos 982, AST 65, ALT 90, lipase 20. CT showed multifocal masslike areas of heterogeneous enhancement within the liver. IN absence of a known malignancy,  And with the history of fever and elevated WBC, these findings are suspicious for a multifocal infection with extensive microabscess. However, the differential considerations include intrahepatic pseudocysts, and metastatic disease to the liver.IN addition, there is a large amount of abnormal soft tissue in the porta hepatis which encases numerous vascular structures and extends downward toward the head of the pancreas.It is uncertainwhether or not this is an infiltrative malignancy such as acholangiocarcinoma, cephalad migration of a primary pancreatic malignancy, malignant lymphadenopathy, or simply a combination orextensive scar tissue and infiltrative atypical appearing pseudocystrelated to chronic pancreatitis with numerous dilated vessels fromcavernous transformation in the porta hepatis. This does appear to be causing some biliary tract obstruction as evidenced by the mildintrahepatic biliary ductal dilatation discussed above.  Pt's wife says he just completed a course of an antibiotic for a UTI.(cefpodoxime x 7 days).    Past Medical History  Diagnosis Date  . Pancreatitis   . Chronic back pain   . Chronic pancreatitis 09/10/2013    Past Surgical History  Procedure Laterality Date  . Appendectomy    . Esophagogastroduodenoscopy (egd) with propofol N/A 09/14/2013    Procedure: ESOPHAGOGASTRODUODENOSCOPY (EGD) WITH PROPOFOL;  Surgeon: Lajuan Lines  Pyrtle, MD;  Location: [facility] ENDOSCOPY;  Service: Gastroenterology;  Laterality: N/A;  . Gastrointestinal stent removal N/A 09/14/2013    Procedure: GASTROINTESTINAL STENT REMOVAL;  Surgeon: Jerene Bears, MD;  Location: [facility] ENDOSCOPY;  Service:  Gastroenterology;  Laterality: N/A;  pancreatic stent removal    Prior to Admission medications   Medication Sig Start Date End Date Taking? Authorizing Provider  acetaminophen (TYLENOL) 325 MG tablet Take 650 mg by mouth every 6 (six) hours as needed for fever (fever).    Historical Provider, MD  cefpodoxime (VANTIN) 100 MG tablet Take 100 mg by mouth 2 (two) times daily.    Historical Provider, MD  Multiple Vitamin (MULTIVITAMIN WITH MINERALS) TABS tablet Take 1 tablet by mouth daily.    Historical Provider, MD  omeprazole (PRILOSEC) 20 MG capsule Take 1 capsule (20 mg total) by mouth daily. 02/01/14   Shari A Upstill, PA-C  oxyCODONE-acetaminophen (PERCOCET/ROXICET) 5-325 MG per tablet Take 1-2 tablets by mouth every 4 (four) hours as needed for severe pain. 02/01/14   Dewaine Oats, PA-C    Current Facility-Administered Medications  Medication Dose Route Frequency Provider Last Rate Last Dose  . 0.9 %  sodium chloride infusion   Intravenous Continuous Reyne Dumas, MD      . docusate sodium (COLACE) capsule 100 mg  100 mg Oral BID Reyne Dumas, MD      . enoxaparin (LOVENOX) injection 40 mg  40 mg Subcutaneous Q24H Nayana Abrol, MD      . HYDROmorphone (DILAUDID) injection 0.5 mg  0.5 mg Intravenous Q4H PRN Reyne Dumas, MD      . levalbuterol (XOPENEX) nebulizer solution 0.63 mg  0.63 mg Nebulization Q6H PRN Reyne Dumas, MD      . ondansetron (ZOFRAN) tablet 4 mg  4 mg Oral Q6H PRN Reyne Dumas, MD       Or  . ondansetron (ZOFRAN) injection 4 mg  4 mg Intravenous Q6H PRN Reyne Dumas, MD      . sodium chloride 0.9 % injection 3 mL  3 mL Intravenous Q12H Reyne Dumas, MD        Allergies as of 02/16/2014  . (No Known Allergies)    No family history on file.  History   Social History  . Marital Status: Married    Spouse Name: N/A    Number of Children: N/A  . Years of Education: N/A   Occupational History  . Not on file.   Social History Main Topics  . Smoking status:  Current Every Day Smoker -- 0.50 packs/day    Types: Cigarettes  . Smokeless tobacco: Not on file  . Alcohol Use: No     Comment: former  . Drug Use: No  . Sexual Activity: Yes   Other Topics Concern  . Not on file   Social History Narrative    Review of Systems: Gen: Has had fevers, anorexia, malaise CV: Denies chest pain, angina, palpitations, syncope, orthopnea, PND, peripheral edema, and claudication. Resp: Denies dyspnea at rest, dyspnea with exercise, cough, sputum, wheezing, coughing up blood, and pleurisy. GI: Denies vomiting blood, jaundice, and fecal incontinence.   Denies dysphagia or odynophagia. GU : Denies urinary burning, blood in urine, urinary frequency, urinary hesitancy, nocturnal urination, and urinary incontinence. MS: Denies joint pain, limitation of movement, and swelling, stiffness, low back pain, extremity pain. Denies muscle weakness, cramps, atrophy.  Derm: Denies rash, itching, dry skin, hives, moles, warts, or unhealing ulcers.  Psych: Denies depression, anxiety, memory loss, suicidal ideation,  hallucinations, paranoia, and confusion. Heme: Denies bruising, bleeding, and enlarged lymph nodes. Neuro:  Denies any headaches, dizziness, paresthesias. Endo:  Denies any problems with DM, thyroid, adrenal function.  Physical Exam: Vital signs in last 24 hours: Temp:  [98.1 F (36.7 C)] 98.1 F (36.7 C) (11/25 1357) Pulse Rate:  [80] 80 (11/25 1357) Resp:  [20] 20 (11/25 1357) BP: (96)/(60) 96/60 mmHg (11/25 1357) SpO2:  [99 %] 99 % (11/25 1357)   General:   Alert,  Well-developed, well-nourished, pleasant and cooperative in NAD Head:  Normocephalic and atraumatic. Eyes:  Sclera clear, no icterus.  Conjunctiva pink. Ears:  Normal auditory acuity. Nose:  No deformity, discharge,  or lesions. Mouth:  No deformity or lesions.   Neck:  Supple; no masses or thyromegaly. Lungs:  Clear throughout to auscultation.   No wheezes, crackles, or rhonchi.  Heart:   Regular rate and rhythm; no murmurs, clicks, rubs,  or gallops. Abdomen:  Soft,nontender, BS active, nondistended.   Rectal:  Deferred  Msk:  Symmetrical without gross deformities. . Pulses:  Normal pulses noted. Extremities:  Without clubbing or edema. Neurologic:  Alert and  oriented x4;  grossly normal neurologically. Skin:  Intact without significant lesions or rashes.. Psych:  Alert and cooperative. Normal mood and affect.   Lab Results:  Recent Labs  02/15/14 1522  WBC 18.6*  HGB 10.1*  HCT 30.2*  PLT 276.0   BMET  Recent Labs  02/15/14 1522  NA 131*  K 3.5  CL 98  CO2 22  GLUCOSE 148*  BUN 13  CREATININE 0.8  CALCIUM 8.0*   LFT  Recent Labs  02/15/14 1522  PROT 7.8  ALBUMIN 2.1*  AST 65*  ALT 90*  ALKPHOS 982*  BILITOT 3.7*      Studies/Results: Ct Abdomen Pelvis W Contrast  02/16/2014   CLINICAL DATA:  43 year old male with elevated liver function tests, history of pancreatitis, 3 week history of abdominal pain, weight loss and fever. Elevated white blood cell count.  EXAM: CT ABDOMEN AND PELVIS WITH CONTRAST  TECHNIQUE: Multidetector CT imaging of the abdomen and pelvis was performed using the standard protocol following bolus administration of intravenous contrast.  CONTRAST:  153m OMNIPAQUE IOHEXOL 300 MG/ML  SOLN  COMPARISON:  CT of the abdomen and pelvis 09/08/2013.  FINDINGS: Lower chest: Multifocal linear opacities in the lung bases bilaterally, compatible with areas of scarring and/or subsegmental atelectasis.  Hepatobiliary: Compared to the prior examination there are multiple new areas of heterogeneous hypoattenuation in the liver which are somewhat masslike in appearance. These include an ill-defined area in segments 8 and 4A measuring approximately 5.2 x 4.0 cm (image 7 of series 7), a lesion in the central aspect of predominantly segment 8 (image 15 of series 7), and an elongated lesion which appears to involve portions of both segments 7 and  1 (image 21 of series 7) measuring 9.9 x 3.8 cm. Several other smaller lesions are also noted. 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 In addition, there is some mild intrahepatic biliary ductal dilatation. Importantly, the confluence of the intrahepatic bile ducts, common hepatic duct and proximal common bile duct are well visualized, and the proximal common bile duct measures only 7 mm in diameter. This does appear focally narrowed as it transitions toward the head  of the pancreas in the porta hepatis, best appreciated on image 25 of series 7, likely narrowed from mass effect from the multiple enlarged vessels in this region. The possibility of a centrally obstructing neoplasm in the hepatic hilum is difficult to exclude, although no discrete mass is confidently identified at this time. Gallbladder is moderately distended, but there are no gallstones, no gallbladder wall thickening and no surrounding inflammatory changes.  Pancreas: Previously noted complex cystic lesion in the head and proximal body of the pancreas has largely resolved, presumably a pancreatic pseudocyst on the prior examination. There continues to be changes of chronic pancreatitis with extensive multifocal calcifications throughout the pancreas, chronic pancreatic ductal dilatation, and severe atrophy of the body and tail of the pancreas. Large calcifications are noted in the proximal body and head of the pancreas, and there is a linear calcification or other high density material which appears to extend into the second portion of the duodenum best appreciated on images 32 and 33. Per report, the patient recently had a stent removed from the minor pancreatic duct. This high density material could  represent a fragment of the stent, or could simply represent some dystrophic calcification in the ductal wall which formed around the previous chronically indwelling stent.  Spleen: The spleen is markedly enlarged measuring 16.4 x 5.7 x 13.2 cm (estimated splenic volume of 617 mL).  Adrenals/Urinary Tract: Sub cm low-attenuation lesions in the kidneys bilaterally are too small to definitively characterize, but are statistically favored to represent small cysts. In addition, there is a 3.2 cm simple cyst in the lower pole of the left kidney. No hydroureteronephrosis. Urinary bladder is normal in appearance. Bilateral adrenal glands are normal in appearance.  Stomach/Bowel: Normal appearance of the stomach. No pathologic dilatation of small bowel or colon.  Vascular/Lymphatic: The distal aspect of the splenic vein is occluded shortly before the splenoportal confluence. 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 The distal portal vein is reconstituted in the hepatic hilum, or there is extensive cavernous transformation (the largest varix in this region measures up to 1.5 cm in diameter). The right main portal vein branch appears widely patent, while the left main portal vein branch appears diminutive (image 19 of series 7), and there is low-attenuation surrounding these main portal vein branches extending outward from the hepatic hilum. Whether or not this represents infection, periportal edema, or underlying neoplasm is uncertain on today's examination. Mild atherosclerosis throughout the abdominal and pelvic vasculature, without evidence of arterial aneurysm or dissection. Coronal image 37 of series 604 demonstrates abnormal soft tissue extending downward from the hepatic hilum toward the head of the pancreas, encasing numerous vascular structures. Whether or not this is lymphadenopathy, chronic scarring  related to chronic pancreatitis, pancreatic neoplasm, an infiltrative atypical appearing pseudocyst or other neoplasm such as cholangiocarcinoma is difficult to ascertain.  Reproductive: Prostate gland and seminal vesicles are unremarkable in appearance.  Other: Trace volume of perihepatic ascites tracking downward in the right pericolic gutter. No pneumoperitoneum.  Musculoskeletal: There are no aggressive appearing lytic or blastic lesions noted in the visualized portions of the skeleton.  IMPRESSION: 1. Today's study demonstrates new multifocal mass-like areas of heterogeneous enhancement within the liver. In the absence of a known malignancy, and with the history of fever and elevated white blood cell count, these findings are suspicious for multifocal infection with extensive microabscess ease. However, other differential considerations include intrahepatic pseudocysts,  and metastatic disease to the liver. Correlation with biopsy is recommended if clinically appropriate. 2. In addition, there is a large amount of abnormal soft tissue in the porta hepatis which encases numerous vascular structures and extends downward toward the head of the pancreas. It is uncertain whether or not this is an infiltrative malignancy such as a cholangiocarcinoma, cephalad migration of a primary pancreatic malignancy, malignant lymphadenopathy, or simply a combination of extensive scar tissue and infiltrative atypical appearing pseudocyst related to chronic pancreatitis with numerous dilated vessels from cavernous transformation in the porta hepatis. This does appear to be causing some biliary tract obstruction as evidenced by the mild intrahepatic biliary ductal dilatation discussed above. Correlation with endoscopic ultrasound and carefully placed biopsy of this tissue (to avoid the very large varices in this region) is suggested if clinically appropriate. 3. 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 4.  Persistent linear high-density structure extending from the pancreatic head into the second portion of the duodenum. Given the history of extraction of a stent from the minor duct, this is presumably either some chronic dystrophic calcification in the ductal wall around where the prior stent was placed, or could represent a small fragment of the previously retrieved stent. Attention to this region at time of follow-up endoscopy is suggested. 5. Sequela of chronic pancreatitis redemonstrated, with resolution of the previously noted multi-cystic mass like area in the head of the pancreas compared to the prior examination, which was presumably a pancreatic pseudocyst. 6. Additional incidental findings, as above.   Electronically Signed   By: Vinnie Langton M.D.   On: 02/16/2014 12:30    IMPRESSION/PLAN: 43 yo male with history of chronic pancreatitis with acute pancreatitis and pseudocysts in 08/2013 (due to ETOH), who presents with elevated LFTs, fevr, intermittent abdominal pain, and findings on ultrasound consistent with cirrhosis/portal HTN. CT with hepatic abscesses, and a large amount of abnormal soft tissue in the porta hepatis encasing multiple vascular structures and extending downward toward the head of the pancreas.It is not clear as to whether this is a malignancy such as a cholangiocarcinoma, cephalad migration of a primary pancreatic malignancy, lymphadenopathy, or scar tissue. It appears to be causing some mild intrahepatic ductal dilatation. Pt to be admitted for IV fluids, zosyn, bowel rest. Per medical progress note, ID to see pt as well.  Will review with Dr Deatra Ina as to whether pt a candiate for ERCP or EUS.  Repeat lipase and CMET ordered. AFP and CA-19-9 ordered.    Santina Trillo, Deloris Ping 02/16/2014,  Pager [phone]

## 2014-02-16 NOTE — H&P (Addendum)
[facility] History and Physical  Lawrence Brock DEY:[id number] DOB: 08/02/1970 DOA: 02/16/2014  Referring physician:   PCP: [name], MD   Chief Complaint:   HPI:  43 year old male who is known remotely to Dr. [name]. 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 At that time he had a pancreatitic stent in place that was placed in [place] and had been in place for several years. It was removed with snare during EGD on 6/23 by Dr. Hilarie Fredrickson at which time he was also found to have single non-bleeding ulcer in the duodenal bulb. Biopsies were negative for Hpylori. He was supposed to follow-up for repeat imaging in 6-8 weeks but failed to do so. Presented  11/24  with complaints of intermittent upper abdominal pains and fevers up to 101. Is drinking a lot of fluids but not eating much. Denies any ETOH since May, prior to his hospitalization. He has been taking tylenol for fever and no more than 1 Oxycodone per day for severe pain. 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 Stable in appearance.  Changes of prior pseudocyst in the pancreas. Stable in appearance. No cholelithiasis is noted."  CBC showed elevated WBC count at 15., normal lipase. LFT's were elevated at AST 65, ALT 90, ALP 982, and total bili 3.7,. Increased from 11/17.   Patient sent from Dr [name] office for further eval.Patient states he has not had alcohol since his discharge in June, denies any weight loss   Review of Systems: negative for the following  Constitutional: Denies fever, chills, diaphoresis, appetite change and fatigue.  HEENT: Denies photophobia, eye pain, redness, hearing loss, ear pain, congestion, sore throat, rhinorrhea, sneezing, mouth sores, trouble swallowing, neck pain, neck stiffness and tinnitus.  Respiratory: Denies  SOB, DOE, cough, chest tightness, and wheezing.  Cardiovascular: Denies chest pain, palpitations and leg swelling.  Gastrointestinal: Positive for nausea, vomiting and abdominal pain. Negative for diarrhea, constipation and melena.   Genitourinary: Denies dysuria, urgency, frequency, hematuria, flank pain and difficulty urinating.  Musculoskeletal: Denies myalgias, back pain, joint swelling, arthralgias and gait problem.  Skin: Denies pallor, rash and wound.  Neurological: Denies dizziness, seizures, syncope, weakness, light-headedness, numbness and headaches.  Hematological: Denies adenopathy. Easy bruising, personal or family bleeding history  Psychiatric/Behavioral: Denies suicidal ideation, mood changes, confusion, nervousness, sleep disturbance and agitation       Past Medical History  Diagnosis Date  . Pancreatitis   . Chronic back pain   . Chronic pancreatitis 09/10/2013     Past Surgical History  Procedure Laterality Date  . Appendectomy    . Esophagogastroduodenoscopy (egd) with propofol N/A 09/14/2013    Procedure: ESOPHAGOGASTRODUODENOSCOPY (EGD) WITH PROPOFOL;  Surgeon: Jerene Bears, MD;  Location: [facility];  Service: Gastroenterology;  Laterality: N/A;  . Gastrointestinal stent removal N/A 09/14/2013    Procedure: GASTROINTESTINAL STENT REMOVAL;  Surgeon: Jerene Bears, MD;  Location: [facility];  Service: Gastroenterology;  Laterality: N/A;  pancreatic stent removal      Social History:  reports that he has been smoking Cigarettes.  He has been smoking about 0.50 packs per day. He does not have any smokeless tobacco history on file. He reports that he does not drink alcohol or use illicit drugs.    No Known Allergies  No family history on file.   Prior to Admission medications  Medication Sig Start Date End Date Taking? Authorizing Provider  acetaminophen (TYLENOL) 325 MG tablet Take 650 mg by mouth every 6 (six) hours as needed for fever (fever).   Yes  Historical Provider, MD  cefpodoxime (VANTIN) 100 MG tablet Take 100 mg by mouth 2 (two) times daily.   Yes Historical Provider, MD  ibuprofen (ADVIL,MOTRIN) 200 MG tablet Take 400 mg by mouth every 6 (six) hours as needed for headache or moderate pain.   Yes Historical Provider, MD  influenza vac recombinant HA trivalent (FLUBLOK) injection Inject 0.5 mLs into the muscle once.   Yes Historical Provider, MD  Multiple Vitamin (MULTIVITAMIN WITH MINERALS) TABS tablet Take 1 tablet by mouth daily.   Yes Historical Provider, MD  omeprazole (PRILOSEC) 20 MG capsule Take 1 capsule (20 mg total) by mouth daily. 02/01/14  Yes Shari A Upstill, PA-C  oxyCODONE-acetaminophen (PERCOCET/ROXICET) 5-325 MG per tablet Take 1-2 tablets by mouth every 4 (four) hours as needed for severe pain. 02/01/14  Yes Dewaine Oats, PA-C     Physical Exam: Filed Vitals:   02/16/14 1357 02/16/14 1421  BP: 96/60   Pulse: 80   Temp: 98.1 F (36.7 C)   TempSrc: Oral   Resp: 20   Height:  5\' 8"  (1.727 m)  Weight:  63.05 kg (139 lb)  SpO2: 99%      Constitutional: Vital signs reviewed. Patient is a well-developed and well-nourished in no acute distress and cooperative with exam. Alert and oriented x3.  Head: Normocephalic and atraumatic  Ear: TM normal bilaterally  Mouth: no erythema or exudates, MMM  Eyes: PERRL, EOMI, conjunctivae normal, No scleral icterus.  Neck: Supple, Trachea midline normal ROM, No JVD, mass, thyromegaly, or carotid bruit present.  Cardiovascular: RRR, S1 normal, S2 normal, no MRG, pulses symmetric and intact bilaterally  Pulmonary/Chest: CTAB, no wheezes, rales, or rhonchi  Abdominal: Soft. Complaining off left lower quadrant pain, non-distended, bowel sounds are normal, no masses, organomegaly, or guarding present.  GU: no CVA tenderness Musculoskeletal: No joint deformities, erythema, or stiffness, ROM full and no nontender Ext: no edema and no cyanosis, pulses palpable bilaterally (DP  and PT)  Hematology: no cervical, inginal, or axillary adenopathy.  Neurological: A&O x3, Strenght is normal and symmetric bilaterally, cranial nerve II-XII are grossly intact, no focal motor deficit, sensory intact to light touch bilaterally.  Skin: Warm, dry and intact. No rash, cyanosis, or clubbing.  Psychiatric: Normal mood and affect. speech and behavior is normal. Judgment and thought content normal. Cognition and memory are normal.       Labs on Admission:    Basic Metabolic Panel:  Recent Labs Lab 02/15/14 1522  NA 131*  K 3.5  CL 98  CO2 22  GLUCOSE 148*  BUN 13  CREATININE 0.8  CALCIUM 8.0*   Liver Function Tests:  Recent Labs Lab 02/15/14 1522  AST 65*  ALT 90*  ALKPHOS 982*  BILITOT 3.7*  PROT 7.8  ALBUMIN 2.1*    Recent Labs Lab 02/15/14 1522  LIPASE 20.0   No results for input(s): AMMONIA in the last 168 hours. CBC:  Recent Labs Lab 02/15/14 1522 02/16/14 1430  WBC 18.6* 16.5*  NEUTROABS 16.7*  --   HGB 10.1* 7.9*  HCT 30.2* 24.2*  MCV 89.3 89.3  PLT 276.0 221   Cardiac Enzymes: No results for input(s): CKTOTAL, CKMB, CKMBINDEX, TROPONINI in the last 168 hours.  BNP (last 3 results) No results for input(s): PROBNP in the last 8760 hours.  CBG: No results for input(s): GLUCAP in the last 168 hours.  Radiological Exams on Admission: Ct Abdomen Pelvis W Contrast  02/16/2014   CLINICAL DATA:  43 year old male with elevated liver function tests, history of pancreatitis, 3 week history of abdominal pain, weight loss and fever. Elevated white blood cell count.  EXAM: CT ABDOMEN AND PELVIS WITH CONTRAST  TECHNIQUE: Multidetector CT imaging of the abdomen and pelvis was performed using the standard protocol following bolus administration of intravenous contrast.  CONTRAST:  163mL OMNIPAQUE IOHEXOL 300 MG/ML  SOLN  COMPARISON:  CT of the abdomen and pelvis 09/08/2013.  FINDINGS: Lower chest: Multifocal linear opacities in the lung bases  bilaterally, compatible with areas of scarring and/or subsegmental atelectasis.  Hepatobiliary: Compared to the prior examination there are multiple new areas of heterogeneous hypoattenuation in the liver which are somewhat masslike in appearance. These include an ill-defined area in segments 8 and 4A measuring approximately 5.2 x 4.0 cm (image 7 of series 7), a lesion in the central aspect of predominantly segment 8 (image 15 of series 7), and an elongated lesion which appears to involve portions of both segments 7 and 1 (image 21 of series 7) measuring 9.9 x 3.8 cm. Several other smaller lesions are also noted. 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 In addition, there is some mild intrahepatic biliary ductal dilatation. Importantly, the confluence of the intrahepatic bile ducts, common hepatic duct and proximal common bile duct are well visualized, and the proximal common bile duct measures only 7 mm in diameter. This does appear focally narrowed as it transitions toward the head of the pancreas in the porta hepatis, best appreciated on image 25 of series 7, likely narrowed from mass effect from the multiple enlarged vessels in this region. The possibility of a centrally obstructing neoplasm in the hepatic hilum is difficult to exclude, although no discrete mass is confidently identified at this time. Gallbladder is moderately distended, but there are no gallstones, no gallbladder wall thickening and no surrounding inflammatory changes.  Pancreas: Previously noted complex cystic lesion in the head and proximal body of the pancreas has largely resolved, presumably a pancreatic pseudocyst on the prior examination. There continues to be changes of chronic pancreatitis  with extensive multifocal calcifications throughout the pancreas, chronic pancreatic ductal dilatation, and severe atrophy of the body and tail of the pancreas. Large calcifications are noted in the proximal body and head of the pancreas, and there is a linear calcification or other high density material which appears to extend into the second portion of the duodenum best appreciated on images 32 and 33. Per report, the patient recently had a stent removed from the minor pancreatic duct. This high density material could represent a fragment of the stent, or could simply represent some dystrophic calcification in the ductal wall which formed around the previous chronically indwelling stent.  Spleen: The spleen is markedly enlarged measuring 16.4 x 5.7 x 13.2 cm (estimated splenic volume of 617 mL).  Adrenals/Urinary Tract: Sub cm low-attenuation lesions in the kidneys bilaterally are too small to definitively characterize, but are statistically favored to represent small cysts. In addition, there is a 3.2 cm simple cyst in the lower pole of the left kidney. No hydroureteronephrosis. Urinary bladder is normal in appearance. Bilateral adrenal glands are normal in  appearance.  Stomach/Bowel: Normal appearance of the stomach. No pathologic dilatation of small bowel or colon.  Vascular/Lymphatic: The distal aspect of the splenic vein is occluded shortly before the splenoportal confluence. 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 The distal portal vein is reconstituted in the hepatic hilum, or there is extensive cavernous transformation (the largest varix in this region measures up to 1.5 cm in diameter). The right main portal vein branch appears widely patent, while the left main portal vein branch appears diminutive (image 19 of series 7), and there is low-attenuation surrounding these main portal vein  branches extending outward from the hepatic hilum. Whether or not this represents infection, periportal edema, or underlying neoplasm is uncertain on today's examination. Mild atherosclerosis throughout the abdominal and pelvic vasculature, without evidence of arterial aneurysm or dissection. Coronal image 37 of series 604 demonstrates abnormal soft tissue extending downward from the hepatic hilum toward the head of the pancreas, encasing numerous vascular structures. Whether or not this is lymphadenopathy, chronic scarring related to chronic pancreatitis, pancreatic neoplasm, an infiltrative atypical appearing pseudocyst or other neoplasm such as cholangiocarcinoma is difficult to ascertain.  Reproductive: Prostate gland and seminal vesicles are unremarkable in appearance.  Other: Trace volume of perihepatic ascites tracking downward in the right pericolic gutter. No pneumoperitoneum.  Musculoskeletal: There are no aggressive appearing lytic or blastic lesions noted in the visualized portions of the skeleton.  IMPRESSION: 1. Today's study demonstrates new multifocal mass-like areas of heterogeneous enhancement within the liver. In the absence of a known malignancy, and with the history of fever and elevated white blood cell count, these findings are suspicious for multifocal infection with extensive microabscess ease. However, other differential considerations include intrahepatic pseudocysts, and metastatic disease to the liver. Correlation with biopsy is recommended if clinically appropriate. 2. In addition, there is a large amount of abnormal soft tissue in the porta hepatis which encases numerous vascular structures and extends downward toward the head of the pancreas. It is uncertain whether or not this is an infiltrative malignancy such as a cholangiocarcinoma, cephalad migration of a primary pancreatic malignancy, malignant lymphadenopathy, or simply a combination of extensive scar tissue and infiltrative  atypical appearing pseudocyst related to chronic pancreatitis with numerous dilated vessels from cavernous transformation in the porta hepatis. This does appear to be causing some biliary tract obstruction as evidenced by the mild intrahepatic biliary ductal dilatation discussed above. Correlation with endoscopic ultrasound and carefully placed biopsy of this tissue (to avoid the very large varices in this region) is suggested if clinically appropriate. 3. 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 4. Persistent linear high-density structure extending from the pancreatic head into the second portion of the duodenum. Given the history of extraction of a stent from the minor duct, this is presumably either some chronic dystrophic calcification in the ductal wall around where the prior stent was placed, or could represent a small fragment of the previously retrieved stent. Attention to this region at time of follow-up endoscopy is suggested. 5. Sequela of chronic pancreatitis redemonstrated, with resolution of the previously noted multi-cystic mass like area in the head of the pancreas compared to the prior examination, which was presumably a pancreatic pseudocyst. 6. Additional incidental findings, as above.   Electronically Signed   By: Vinnie Langton M.D.   On: 02/16/2014 12:30    EKG: Independently reviewed.   Assessment/Plan Active Problems:   Hepatic  abscess  Hepatic abscesses Will start the patient on Zosyn Clear liquids tonight nothing by mouth after midnight Appreciate GI input ERCP versus EUS Alpha-fetoprotein and CA-19-9 ordered  Anemia, acute blood loss?  No such history provided by patient Will repeat CBC Type and screen Transfuse if actively bleeding Start the patient on a Protonix drip    Code Status:   full Family Communication: bedside Disposition Plan: admit   Time spent: 70 mins   [facility] Pager [phone]  If 7PM-7AM,  please contact night-coverage [email or web address] Password [email or web address] 02/16/2014, 3:08 PM

## 2014-02-16 NOTE — Telephone Encounter (Signed)
-----   Message from Madison D. Zehr, PA-C sent at 02/16/2014 11:54 AM EST ----- Please contact patient and let him know that his CT scan is concerning for multiple abscesses in his liver.  He needs to be admitted to the hospital for IV antibiotics and further evaluation.  Already set up to be admitted to [facility] at [facility] so he needs to go there as soon as possible.  Go to admitting when he arrives.   Thank you,  Jess

## 2014-02-16 NOTE — Progress Notes (Signed)
Direct admit from [facility] office. 43 year old with h/o chronic pancreatitis was seen yesterday for abdominal pain, fever and leukocytosis. CT abdomen was ordered by GI, and he was found to have multiple liver abscesses. He was referred to medical service for admission.  He will be coming to MED[facility]urg bed and ID needs to be consulted .    Hosie Poisson, MD [phone]

## 2014-02-16 NOTE — Telephone Encounter (Signed)
Unable to leave message because memory is full. Will try again later.

## 2014-02-16 NOTE — Progress Notes (Signed)
Pt arrived as a direct admit. From MD 's office accompanied by family. Pt is alert and oriented, able to communicate needs.  MD notified of Pt's location, will continue with current plan of care.

## 2014-02-16 NOTE — Progress Notes (Signed)
Complex pancreatic history; not sure what was done in past (or by whom) however a minor duct PD stent was in place this past summer and removed by Dr. Hilarie Fredrickson endoscopically.    Jess, his wbc is 18k.  He should be started on cipro twice daily, empirically, for now.   With fevers, elevated bilirubin he may require ERCP, perhaps in patient admission to facilitate all the above.  Await CT from today.

## 2014-02-16 NOTE — Telephone Encounter (Signed)
Spoke with patient and his wife. He is going to admitting at [facility] now.

## 2014-02-17 LAB — CBC
HCT: 31.6 % — ABNORMAL LOW (ref 39.0–52.0)
HEMOGLOBIN: 10.3 g/dL — AB (ref 13.0–17.0)
MCH: 29.9 pg (ref 26.0–34.0)
MCHC: 32.6 g/dL (ref 30.0–36.0)
MCV: 91.6 fL (ref 78.0–100.0)
Platelets: 205 10*3/uL (ref 150–400)
RBC: 3.45 MIL/uL — AB (ref 4.22–5.81)
RDW: 15.3 % (ref 11.5–15.5)
WBC: 13.6 10*3/uL — AB (ref 4.0–10.5)

## 2014-02-17 LAB — COMPREHENSIVE METABOLIC PANEL
ALT: 93 U/L — AB (ref 0–53)
AST: 58 U/L — AB (ref 0–37)
Albumin: 1.7 g/dL — ABNORMAL LOW (ref 3.5–5.2)
Alkaline Phosphatase: 1095 U/L — ABNORMAL HIGH (ref 39–117)
Anion gap: 14 (ref 5–15)
BUN: 11 mg/dL (ref 6–23)
CALCIUM: 8.8 mg/dL (ref 8.4–10.5)
CO2: 22 mEq/L (ref 19–32)
Chloride: 102 mEq/L (ref 96–112)
Creatinine, Ser: 0.71 mg/dL (ref 0.50–1.35)
GFR calc Af Amer: >90 mL/min (ref >90)
GFR calc non Af Amer: >90 mL/min (ref >90)
Glucose, Bld: 106 mg/dL — ABNORMAL HIGH (ref 70–99)
Potassium: 3.9 mEq/L (ref 3.7–5.3)
Sodium: 138 mEq/L (ref 137–147)
TOTAL PROTEIN: 7.7 g/dL (ref 6.0–8.3)
Total Bilirubin: 2.8 mg/dL — ABNORMAL HIGH (ref 0.3–1.2)

## 2014-02-17 LAB — CANCER ANTIGEN 19-9: CA 19-9: 195.9 U/mL — ABNORMAL HIGH (ref <35.0)

## 2014-02-17 NOTE — Progress Notes (Signed)
Patient ID: Lawrence Brock, male   DOB: [birth year], 43 y.o.   MRN: [id number]  [facility] PROGRESS NOTE  Chadwin Fury QIO:[id number] DOB: 01/09/1971 DOA: 02/16/2014 PCP: [name], MD  Brief narrative: 43 year old male who is known remotely to Dr. [name]. 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 At that time he had a pancreatitic stent in place that was placed in [place] and had been in place for several years. It was removed with snare during EGD on 6/23 by Dr. Hilarie Fredrickson at which time he was also found to have single non-bleeding ulcer in the duodenal bulb. Biopsies were negative for Hpylori. He was supposed to follow-up for repeat imaging in 6-8 weeks but failed to do so. Presented 11/24 with complaints of intermittent upper abdominal pains and fevers up to 101. Is drinking a lot of fluids but not eating much. Denies any ETOH since May, prior to his hospitalization. He has been taking tylenol for fever and no more than 1 Oxycodone per day for severe pain. 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 LFT's were elevated at AST 65, ALT 90, ALP 982, and total bili 3.7,. Increased from 11/17.Patient sent from Dr [name] office for further eval. Patient states he has not had alcohol since his discharge in June, denies any weight loss  Assessment and Plan:    Active Problems:   ? Hepatic abscess - ? portal hepatis mass obstructing the area - management per GI team, appreciate assistance - continue Zosyn day #2 and repeat CBC in AM   J-shaped calcification in his pancreas - ? retained stent or dystrophic calcification of the duct - may need ERCP, will follow up with GI    Transaminitis - secondary to the above - monitor   Leukocytosis with fever, SIRS - secondary to principal problem  - WBC trending down and pt afebrile this AM - continue Zosyn as noted above    Acute on chronic blood loss anemia  - possible related to the above as well - no signs of active bleeding - repeat CBC in AM  DVT prophylaxis  Lovenox SQ while pt is in hospital  Code Status: Full Family Communication: Pt at bedside Disposition Plan: Home when medically stable   IV Access:   Peripheral IV Procedures and diagnostic studies:   Ct Abdomen Pelvis W Contrast  02/16/2014    1. New multifocal mass-like areas of heterogeneous enhancement within the liver. In the absence of a known malignancy, and with the history of fever and elevated white blood cell count, ? multifocal infection with extensive microabscess. However, other differential considerations include intrahepatic pseudocysts, and metastatic disease to the liver. Correlation with biopsy is recommended if clinically appropriate.  2. Large amount of abnormal soft tissue in the porta hepatis which encases numerous vascular structures and extends downward toward the head of the pancreas. It is uncertain whether or not this is an infiltrative malignancy such as a cholangiocarcinoma, cephalad migration of a primary pancreatic malignancy, malignant lymphadenopathy, or simply a combination of extensive scar tissue and infiltrative atypical appearing pseudocyst related to chronic pancreatitis with numerous dilated vessels from cavernous transformation in the porta hepatis. This does appear to be causing some biliary tract obstruction as evidenced by the mild intrahepatic  biliary ductal dilatation discussed above. Correlation with endoscopic ultrasound and carefully placed biopsy of this tissue (to avoid the very large varices in this region) is suggested if clinically appropriate.  3. 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  4. Persistent linear high-density structure  extending from the pancreatic head into the second portion of the duodenum. Given the history of extraction of a stent from the minor duct, this is presumably either some chronic dystrophic calcification in the ductal wall around where the prior stent was placed, or could represent a small fragment of the previously retrieved stent. Attention to this region at time of follow-up endoscopy is suggested.  5. Sequela of chronic pancreatitis redemonstrated, with resolution of the previously noted multi-cystic mass like area in the head of the pancreas compared to the prior examination, which was presumably a pancreatic pseudocyst. 6. Additional incidental findings, as above.   Medical Consultants:   GI Other Consultants:   None   Anti-Infectives:   Zosyn 11/25 -->   Faye Ramsay, MD  [facility] Pager [phone]  If 7PM-7AM, please contact night-coverage [email or web address] Password [facility] 02/17/2014, 7:54 AM   LOS: 1 day   HPI/Subjective: No events overnight.   Objective: Filed Vitals:   02/16/14 1805 02/16/14 2019 02/16/14 2130 02/17/14 0506  BP:  118/74  105/71  Pulse:  114  66  Temp: 98.7 F (37.1 C) 103.9 F (39.9 C) 98.6 F (37 C) 98 F (36.7 C)  TempSrc: Oral Oral Oral Oral  Resp:  20  19  Height:      Weight:      SpO2:  96%  100%    Intake/Output Summary (Last 24 hours) at 02/17/14 0754 Last data filed at 02/17/14 0506  Gross per 24 hour  Intake 1643.75 ml  Output    400 ml  Net 1243.75 ml    Exam:   General:  Pt is alert, follows commands appropriately, not in acute distress  Cardiovascular: Regular rate and rhythm, S1/S2, no murmurs, no rubs, no gallops  Respiratory: Clear to auscultation bilaterally, no wheezing, no crackles, no rhonchi  Abdomen: Soft, non tender, non distended, bowel sounds present, no guarding  Extremities: No edema, pulses DP and PT palpable bilaterally  Neuro: Grossly nonfocal  Data Reviewed: Basic Metabolic Panel:  Recent Labs Lab  02/15/14 1522 02/16/14 1430 02/17/14 0505  NA 131*  --  138  K 3.5  --  3.9  CL 98  --  102  CO2 22  --  22  GLUCOSE 148*  --  106*  BUN 13  --  11  CREATININE 0.8 0.84 0.71  CALCIUM 8.0*  --  8.8  MG  --  2.2  --    Liver Function Tests:  Recent Labs Lab 02/15/14 1522 02/17/14 0505  AST 65* 58*  ALT 90* 93*  ALKPHOS 982* 1095*  BILITOT 3.7* 2.8*  PROT 7.8 7.7  ALBUMIN 2.1* 1.7*    Recent Labs Lab 02/15/14 1522  LIPASE 20.0   No results for input(s): AMMONIA in the last 168 hours. CBC:  Recent Labs Lab 02/15/14 1522 02/16/14 1430 02/16/14 1820 02/17/14 0505  WBC 18.6* 16.5* 14.8* 13.6*  NEUTROABS 16.7*  --   --   --   HGB 10.1* 7.9* 10.7* 10.3*  HCT 30.2* 24.2* 32.5* 31.6*  MCV 89.3 89.3 90.0 91.6  PLT 276.0 221 214 205    Recent Results (from the past 240 hour(s))  Urine culture     Status: None  Collection Time: 02/08/14  7:47 PM  Result Value Ref Range Status   Specimen Description URINE, RANDOM  Final   Special Requests NONE  Final   Culture  Setup Time   Final    02/09/2014 01:22 Performed at [place] Performed at [facility]   Final   Culture NO GROWTH Performed at [facility]   Final   Report Status 02/09/2014 FINAL  Final     Scheduled Meds: . docusate sodium  100 mg Oral BID  . enoxaparin (LOVENOX) injection  40 mg Subcutaneous Q24H  . [START ON 02/19/2014] pantoprazole (PROTONIX) IV  40 mg Intravenous Q12H  . piperacillin-tazobactam (ZOSYN)  IV  3.375 g Intravenous Q8H  . sodium chloride  3 mL Intravenous Q12H   Continuous Infusions: . sodium chloride 75 mL/hr at 02/16/14 1620  . pantoprozole (PROTONIX) infusion 8 mg/hr (02/16/14 2115)

## 2014-02-17 NOTE — Progress Notes (Signed)
Progress Note for [facility]  Subjective: Chills at this time.  No feeling so well.  Objective: Vital signs in last 24 hours: Temp:  [98 F (36.7 C)-103.9 F (39.9 C)] 98 F (36.7 C) (11/26 0506) Pulse Rate:  [66-114] 66 (11/26 0506) Resp:  [19-20] 19 (11/26 0506) BP: (96-118)/(60-74) 105/71 mmHg (11/26 0506) SpO2:  [96 %-100 %] 100 % (11/26 0506) Weight:  [63.05 kg (139 lb)] 63.05 kg (139 lb) (11/25 1421)    Intake/Output from previous day: 11/25 0701 - 11/26 0700 In: 1643.8 [I.V.:1043.8; IV Piggyback:600] Out: 400 [Urine:400] Intake/Output this shift:    General appearance: alert and experiencing chills GI: soft, non-tender; bowel sounds normal; no masses,  no organomegaly  Lab Results:  Recent Labs  02/16/14 1430 02/16/14 1820 02/17/14 0505  WBC 16.5* 14.8* 13.6*  HGB 7.9* 10.7* 10.3*  HCT 24.2* 32.5* 31.6*  PLT 221 214 205   BMET  Recent Labs  02/15/14 1522 02/16/14 1430 02/17/14 0505  NA 131*  --  138  K 3.5  --  3.9  CL 98  --  102  CO2 22  --  22  GLUCOSE 148*  --  106*  BUN 13  --  11  CREATININE 0.8 0.84 0.71  CALCIUM 8.0*  --  8.8   LFT  Recent Labs  02/17/14 0505  PROT 7.7  ALBUMIN 1.7*  AST 58*  ALT 93*  ALKPHOS 1095*  BILITOT 2.8*   PT/INR No results for input(s): LABPROT, INR in the last 72 hours. Hepatitis Panel No results for input(s): HEPBSAG, HCVAB, HEPAIGM, HEPBIGM in the last 72 hours. C-Diff No results for input(s): CDIFFTOX in the last 72 hours. Fecal Lactopherrin No results for input(s): FECLLACTOFRN in the last 72 hours.  Studies/Results: Ct Abdomen Pelvis W Contrast  02/16/2014   CLINICAL DATA:  43 year old male with elevated liver function tests, history of pancreatitis, 3 week history of abdominal pain, weight loss and fever. Elevated white blood cell count.  EXAM: CT ABDOMEN AND PELVIS WITH CONTRAST  TECHNIQUE: Multidetector CT imaging of the abdomen and pelvis was performed using the standard protocol  following bolus administration of intravenous contrast.  CONTRAST:  121mL OMNIPAQUE IOHEXOL 300 MG/ML  SOLN  COMPARISON:  CT of the abdomen and pelvis 09/08/2013.  FINDINGS: Lower chest: Multifocal linear opacities in the lung bases bilaterally, compatible with areas of scarring and/or subsegmental atelectasis.  Hepatobiliary: Compared to the prior examination there are multiple new areas of heterogeneous hypoattenuation in the liver which are somewhat masslike in appearance. These include an ill-defined area in segments 8 and 4A measuring approximately 5.2 x 4.0 cm (image 7 of series 7), a lesion in the central aspect of predominantly segment 8 (image 15 of series 7), and an elongated lesion which appears to involve portions of both segments 7 and 1 (image 21 of series 7) measuring 9.9 x 3.8 cm. Several other smaller lesions are also noted. 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 In addition, there is some mild intrahepatic biliary ductal dilatation. Importantly, the confluence of the intrahepatic bile ducts, common hepatic duct and proximal common bile duct are well visualized, and the proximal common bile duct measures only 7 mm in diameter. This does appear focally narrowed as it transitions toward the head of the pancreas in  the porta hepatis, best appreciated on image 25 of series 7, likely narrowed from mass effect from the multiple enlarged vessels in this region. The possibility of a centrally obstructing neoplasm in the hepatic hilum is difficult to exclude, although no discrete mass is confidently identified at this time. Gallbladder is moderately distended, but there are no gallstones, no gallbladder wall thickening and no surrounding inflammatory changes.   Pancreas: Previously noted complex cystic lesion in the head and proximal body of the pancreas has largely resolved, presumably a pancreatic pseudocyst on the prior examination. There continues to be changes of chronic pancreatitis with extensive multifocal calcifications throughout the pancreas, chronic pancreatic ductal dilatation, and severe atrophy of the body and tail of the pancreas. Large calcifications are noted in the proximal body and head of the pancreas, and there is a linear calcification or other high density material which appears to extend into the second portion of the duodenum best appreciated on images 32 and 33. Per report, the patient recently had a stent removed from the minor pancreatic duct. This high density material could represent a fragment of the stent, or could simply represent some dystrophic calcification in the ductal wall which formed around the previous chronically indwelling stent.  Spleen: The spleen is markedly enlarged measuring 16.4 x 5.7 x 13.2 cm (estimated splenic volume of 617 mL).  Adrenals/Urinary Tract: Sub cm low-attenuation lesions in the kidneys bilaterally are too small to definitively characterize, but are statistically favored to represent small cysts. In addition, there is a 3.2 cm simple cyst in the lower pole of the left kidney. No hydroureteronephrosis. Urinary bladder is normal in appearance. Bilateral adrenal glands are normal in appearance.  Stomach/Bowel: Normal appearance of the stomach. No pathologic dilatation of small bowel or colon.  Vascular/Lymphatic: The distal aspect of the splenic vein is occluded shortly before the splenoportal confluence. 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 The distal portal vein is reconstituted in the hepatic hilum, or there is extensive cavernous transformation (the largest varix in this region measures up  to 1.5 cm in diameter). The right main portal vein branch appears widely patent, while the left main portal vein branch appears diminutive (image 19 of series 7), and there is low-attenuation surrounding these main portal vein branches extending outward from the hepatic hilum. Whether or not this represents infection, periportal edema, or underlying neoplasm is uncertain on today's examination. Mild atherosclerosis throughout the abdominal and pelvic vasculature, without evidence of arterial aneurysm or dissection. Coronal image 37 of series 604 demonstrates abnormal soft tissue extending downward from the hepatic hilum toward the head of the pancreas, encasing numerous vascular structures. Whether or not this is lymphadenopathy, chronic scarring related to chronic pancreatitis, pancreatic neoplasm, an infiltrative atypical appearing pseudocyst or other neoplasm such as cholangiocarcinoma is difficult to ascertain.  Reproductive: Prostate gland and seminal vesicles are unremarkable in appearance.  Other: Trace volume of perihepatic ascites tracking downward in the right pericolic gutter. No pneumoperitoneum.  Musculoskeletal: There are no aggressive appearing lytic or blastic lesions noted in the visualized portions of the skeleton.  IMPRESSION: 1. Today's study demonstrates new multifocal mass-like areas of heterogeneous enhancement within the liver. In the absence of a known malignancy, and with the history of fever and elevated white blood cell count, these findings are suspicious for multifocal infection with extensive microabscess ease. However, other differential considerations include intrahepatic pseudocysts, and metastatic disease to  the liver. Correlation with biopsy is recommended if clinically appropriate. 2. In addition, there is a large amount of abnormal soft tissue in the porta hepatis which encases numerous vascular structures and extends downward toward the head of the pancreas. It is uncertain  whether or not this is an infiltrative malignancy such as a cholangiocarcinoma, cephalad migration of a primary pancreatic malignancy, malignant lymphadenopathy, or simply a combination of extensive scar tissue and infiltrative atypical appearing pseudocyst related to chronic pancreatitis with numerous dilated vessels from cavernous transformation in the porta hepatis. This does appear to be causing some biliary tract obstruction as evidenced by the mild intrahepatic biliary ductal dilatation discussed above. Correlation with endoscopic ultrasound and carefully placed biopsy of this tissue (to avoid the very large varices in this region) is suggested if clinically appropriate. 3. 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 4. Persistent linear high-density structure extending from the pancreatic head into the second portion of the duodenum. Given the history of extraction of a stent from the minor duct, this is presumably either some chronic dystrophic calcification in the ductal wall around where the prior stent was placed, or could represent a small fragment of the previously retrieved stent. Attention to this region at time of follow-up endoscopy is suggested. 5. Sequela of chronic pancreatitis redemonstrated, with resolution of the previously noted multi-cystic mass like area in the head of the pancreas compared to the prior examination, which was presumably a pancreatic pseudocyst. 6. Additional incidental findings, as above.   Electronically Signed   By: Vinnie Langton M.D.   On: 02/16/2014 12:30    Medications:  Scheduled: . docusate sodium  100 mg Oral BID  . enoxaparin (LOVENOX) injection  40 mg Subcutaneous Q24H  . [START ON 02/19/2014] pantoprazole (PROTONIX) IV  40 mg Intravenous Q12H  . piperacillin-tazobactam (ZOSYN)  IV  3.375 g Intravenous Q8H  . sodium chloride  3 mL Intravenous Q12H   Continuous: . sodium chloride 75 mL/hr at 02/16/14 1620  . pantoprozole (PROTONIX)  infusion 8 mg/hr (02/16/14 2115)    Assessment/Plan: 1) Hepatic abscess. 2) ? Portal hepatis mass. 3) Fever/chills.   The patient's WBC has decreased as well as the TB, but AP has increased.  I am uncertain if he has a portal hepatis mass obstructing the area. I reviewed the CT scan with Radiology.  The scan is of excellent quality so an MRCP will not be helpful.  He will continue with antibiotics at this time.  The CT scan also reveals a J-shaped calcification in his pancreas, which is suspicious for a retained stent or dystrophic calcification of the duct.  This may require a repeat ERCP.  I will discuss the issue with Dr. Deatra Ina.  Plan: 1) Continue with antibiotics.  LOS: 1 day   Catrina Fellenz D 02/17/2014, 8:33 AM

## 2014-02-18 ENCOUNTER — Inpatient Hospital Stay (HOSPITAL_COMMUNITY): Payer: BC Managed Care – PPO | Admitting: Anesthesiology

## 2014-02-18 ENCOUNTER — Encounter (HOSPITAL_COMMUNITY)
Admission: AD | Disposition: A | Discharge status: Home / Self Care | Payer: BC Managed Care – PPO | Source: Ambulatory Visit | Attending: Internal Medicine

## 2014-02-18 ENCOUNTER — Inpatient Hospital Stay (HOSPITAL_COMMUNITY): Payer: BC Managed Care – PPO

## 2014-02-18 DIAGNOSIS — K750 Abscess of liver: Principal | ICD-10-CM

## 2014-02-18 DIAGNOSIS — K830 Cholangitis: Secondary | ICD-10-CM

## 2014-02-18 DIAGNOSIS — K8309 Other cholangitis: Secondary | ICD-10-CM

## 2014-02-18 DIAGNOSIS — K8050 Calculus of bile duct without cholangitis or cholecystitis without obstruction: Secondary | ICD-10-CM | POA: Diagnosis present

## 2014-02-18 DIAGNOSIS — K831 Obstruction of bile duct: Secondary | ICD-10-CM | POA: Diagnosis present

## 2014-02-18 HISTORY — PX: ERCP: SHX5425

## 2014-02-18 LAB — COMPREHENSIVE METABOLIC PANEL
ALT: 61 U/L — AB (ref 0–53)
AST: 35 U/L (ref 0–37)
Albumin: 1.6 g/dL — ABNORMAL LOW (ref 3.5–5.2)
Alkaline Phosphatase: 798 U/L — ABNORMAL HIGH (ref 39–117)
Anion gap: 13 (ref 5–15)
BILIRUBIN TOTAL: 3 mg/dL — AB (ref 0.3–1.2)
BUN: 12 mg/dL (ref 6–23)
CO2: 23 meq/L (ref 19–32)
Calcium: 8.2 mg/dL — ABNORMAL LOW (ref 8.4–10.5)
Chloride: 102 mEq/L (ref 96–112)
Creatinine, Ser: 0.77 mg/dL (ref 0.50–1.35)
GFR calc non Af Amer: >90 mL/min (ref >90)
Glucose, Bld: 104 mg/dL — ABNORMAL HIGH (ref 70–99)
POTASSIUM: 3.3 meq/L — AB (ref 3.7–5.3)
Sodium: 138 mEq/L (ref 137–147)
TOTAL PROTEIN: 6.6 g/dL (ref 6.0–8.3)

## 2014-02-18 LAB — CBC
HEMATOCRIT: 25.4 % — AB (ref 39.0–52.0)
Hemoglobin: 8.6 g/dL — ABNORMAL LOW (ref 13.0–17.0)
MCH: 30.7 pg (ref 26.0–34.0)
MCHC: 33.9 g/dL (ref 30.0–36.0)
MCV: 90.7 fL (ref 78.0–100.0)
Platelets: 170 10*3/uL (ref 150–400)
RBC: 2.8 MIL/uL — AB (ref 4.22–5.81)
RDW: 14.9 % (ref 11.5–15.5)
WBC: 16.3 10*3/uL — ABNORMAL HIGH (ref 4.0–10.5)

## 2014-02-18 LAB — AFP TUMOR MARKER: AFP-Tumor Marker: 3.3 ng/mL (ref <6.1)

## 2014-02-18 SURGERY — ERCP, WITH INTERVENTION IF INDICATED
Anesthesia: Monitor Anesthesia Care

## 2014-02-18 SURGERY — ERCP, WITH INTERVENTION IF INDICATED
Anesthesia: General

## 2014-02-18 MED ORDER — SUCCINYLCHOLINE CHLORIDE 20 MG/ML IJ SOLN
INTRAMUSCULAR | Status: DC | PRN
  Administered 2014-02-18: 100 mg via INTRAVENOUS

## 2014-02-18 MED ORDER — MEPERIDINE HCL 25 MG/ML IJ SOLN: 6.2500 mg | INTRAMUSCULAR | Status: DC | PRN

## 2014-02-18 MED ORDER — INDOMETHACIN 50 MG RE SUPP
100.0000 mg | RECTAL | Status: DC
  Administered 2014-02-18 (×2): 100 mg via RECTAL
  Filled 2014-02-18: qty 2

## 2014-02-18 MED ORDER — IBUPROFEN 200 MG PO TABS
200.0000 mg | ORAL_TABLET | Freq: Once | ORAL | Status: AC
  Administered 2014-02-18: 200 mg via ORAL
  Filled 2014-02-18 (×2): qty 1

## 2014-02-18 MED ORDER — MIDAZOLAM HCL 2 MG/2ML IJ SOLN: 0.5000 mg | Freq: Once | INTRAMUSCULAR | Status: DC | PRN

## 2014-02-18 MED ORDER — PROMETHAZINE HCL 25 MG/ML IJ SOLN: 6.2500 mg | INTRAMUSCULAR | Status: DC | PRN

## 2014-02-18 MED ORDER — POTASSIUM CHLORIDE IN NACL 40-0.9 MEQ/L-% IV SOLN
INTRAVENOUS | Status: DC
  Administered 2014-02-18: 75 mL/h via INTRAVENOUS
  Filled 2014-02-18 (×3): qty 1000

## 2014-02-18 MED ORDER — LACTATED RINGERS IV SOLN: INTRAVENOUS | Status: DC

## 2014-02-18 MED ORDER — SODIUM CHLORIDE 0.9 % IJ SOLN
INTRAMUSCULAR | Status: DC | PRN
  Administered 2014-02-18: 90 mL

## 2014-02-18 MED ORDER — PROPOFOL 10 MG/ML IV BOLUS
INTRAVENOUS | Status: DC | PRN
  Administered 2014-02-18: 120 mg via INTRAVENOUS
  Administered 2014-02-18: 30 mg via INTRAVENOUS

## 2014-02-18 MED ORDER — FENTANYL CITRATE 0.05 MG/ML IJ SOLN: 25.0000 ug | INTRAMUSCULAR | Status: DC | PRN

## 2014-02-18 MED ORDER — LACTATED RINGERS IV SOLN
INTRAVENOUS | Status: DC | PRN
  Administered 2014-02-18: 11:00:00 via INTRAVENOUS

## 2014-02-18 MED ORDER — PROPOFOL 10 MG/ML IV BOLUS
INTRAVENOUS | Status: AC
  Filled 2014-02-18: qty 20

## 2014-02-18 MED ORDER — ONDANSETRON HCL 4 MG/2ML IJ SOLN
INTRAMUSCULAR | Status: AC
  Filled 2014-02-18: qty 2

## 2014-02-18 MED ORDER — ONDANSETRON HCL 4 MG/2ML IJ SOLN
INTRAMUSCULAR | Status: DC | PRN
Start: 2014-02-18 — End: 2014-02-18
  Administered 2014-02-18: 4 mg via INTRAVENOUS

## 2014-02-18 MED ORDER — INDOMETHACIN 50 MG RE SUPP: 50.0000 mg | RECTAL | Status: DC

## 2014-02-18 MED ORDER — LACTATED RINGERS IV SOLN
INTRAVENOUS | Status: DC
  Administered 2014-02-22: 09:00:00 via INTRAVENOUS

## 2014-02-18 NOTE — Progress Notes (Signed)
See ERCP note.  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  Few tiny calculi were removed from the duct.  So noted were blood clots in the bile duct.  Recommendations #1 check cytology #2 hold Lovenox because of blood clots in the bile duct line #3 continue broad-spectrum antibiotics

## 2014-02-18 NOTE — Op Note (Signed)
[facility] [place], [id number]   ERCP PROCEDURE REPORT        EXAM DATE: 02/18/2014  PATIENT NAME:          Lawrence Brock, Lawrence Brock          MR #: [id number] BIRTHDATE:       1970/09/12     VISIT #:     [id number] ATTENDING:     Inda Castle, MD     STATUS:     inpatient ASSISTANT:      William Dalton and Hilma Favors  INDICATIONS:  The patient is a 43 yr old male here for an ERCP due to suspected ascending cholangitis. PROCEDURE PERFORMED:     ERCP with stent placement ERCP with removal of calculus/calculi ERCP with sphincterotomy/papillotomy MEDICATIONS:     Per Anesthesia  CONSENT: The patient understands the risks and benefits of the procedure and understands that these risks include, but are not limited to: sedation, allergic reaction, infection, perforation and/or bleeding. Alternative means of evaluation and treatment include, among others: physical exam, x-rays, and/or surgical intervention. The patient elects to proceed with this endoscopic procedure.  DESCRIPTION OF PROCEDURE: During intra-op preparation period all mechanical & medical equipment was checked for proper function. Hand hygiene and appropriate measures for infection prevention was taken. After the risks, benefits and alternatives of the procedure were thoroughly explained, Informed was verified, confirmed and timeout was successfully executed by the treatment team. With the patient in left semi-prone position, medications were administered intravenously.The    was passed from the mouth into the esophagus and further advanced from the esophagus into the stomach. From stomach scope was directed to the second portion of the duodenum. Major papilla was aligned with the duodenoscope. The scope position was confirmed fluoroscopically. Rest of the findings/therapeutics are given below. The scope was then completely withdrawn from the patient and the procedure completed. The pulse,  BP, and O2 saturation were monitored and documented by the physician and the nursing staff throughout the entire procedure. The patient was cared for as planned according to standard protocol. The patient was then discharged to recovery in stable condition and with appropriate post procedure care.  Common bile duct was selectively cannulated with a 0.35 mm guidewire.  Injection demonstrated dilation of the proximal portion of the bile duct with possible small filling defects.  There was a distal smooth walled stricture running from the midportion of the bile duct to the ampulla.  A 12 limited sphincterotomy was made and was taken.  There was no blood on the sphincterotomy.  Large blood clots were seen after sweeping the bile duct with a balloon stone extractor.  2 minute stone fragments were removed from the duct. Whenever the duct was swept with a balloon more clots were removed. There was no active bleeding from the bile duct, however.  A #10 French 5 cm plastic biliary stent was placed.  Attempts were made to cannulate the minor papilla.  These were unsuccessful.  A stent remnant was seen in the pancreatic duct just proximal to the minor papilla area.    ADVERSE EVENT: IMPRESSIONS:     Co  1.  common bile duct stricture 2.  Blood clots in the common bile duct 3.  Retained pancreatic stent, dorsal pancreas 4.  Status post sphincterotomy and biliary stent placement   RECOMMENDATIONS:     1.  continue broad-spectrum antibiotics 2.  await cytology results 3.  Hold Lovenox because of blood clots  in the bile duct REPEAT EXAM:   ___________________________________ Inda Castle, MD eSigned:  Inda Castle, MD 2014-03-02 1:38 PM   cc:  CPT CODES: ICD9 CODES:  The ICD and CPT codes recommended by this software are interpretations from the data that the clinical staff has captured with the software.  The verification of the translation of this report to the ICD and CPT  codes and modifiers is the sole responsibility of the health care institution and practicing physician where this report was generated.  [place]. will not be held responsible for the validity of the ICD and CPT codes included on this report.  AMA assumes no liability for data contained or not contained herein. CPT is a Designer, television/film set of the [employer].   PATIENT NAME:  Lawrence Brock, Lawrence Brock MR#: [id number]

## 2014-02-18 NOTE — Progress Notes (Signed)
Patient ID: Lawrence Brock, male   DOB: 1970/08/16, 43 y.o.   MRN: [id number]  [facility] PROGRESS NOTE  Reason Helzer OIN:[id number] DOB: [birth year] DOA: 02/16/2014 PCP: [name], MD   Brief narrative: 43 year old male who is known remotely to Dr. [name]. 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 At that time he had a pancreatitic stent in place that was placed in [place] and had been in place for several years. It was removed with snare during EGD on 6/23 by Dr. Hilarie Fredrickson at which time he was also found to have single non-bleeding ulcer in the duodenal bulb. Biopsies were negative for Hpylori. He was supposed to follow-up for repeat imaging in 6-8 weeks but failed to do so. Presented 11/24 with complaints of intermittent upper abdominal pains and fevers up to 101. Is drinking a lot of fluids but not eating much. Denies any ETOH since May, prior to his hospitalization. He has been taking tylenol for fever and no more than 1 Oxycodone per day for severe pain. 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 LFT's were elevated at AST 65, ALT 90, ALP 982, and total bili 3.7,. Increased from 11/17.Patient sent from Dr [name] office for further eval. Patient states he has not had alcohol since his discharge in June, denies any weight loss  Assessment and Plan:    Active Problems:  ? Hepatic abscess - ? portal hepatis mass obstructing the area - management per GI team, appreciate assistance - continue Zosyn day #3 and repeat CBC in AM  J-shaped calcification in his pancreas/? Clinical cholangitis  - ? retained stent or dystrophic calcification of the duct - ERCP today per GI team     Hypokalemia - supplement and repeat BMP in  AM  Transaminitis - secondary to the above - monitor   Leukocytosis with fever, SIRS - secondary to principal problem  - WBC trending up - continue Zosyn as noted above   Acute on chronic blood loss anemia  - possible related to the above as well - no signs of active bleeding but Hg is down over the past 24 hours: 10.3 --> 8.6 - repeat CBC in AM  DVT prophylaxis  Change Lovenox to SCD's   Code Status: Full Family Communication: Pt at bedside Disposition Plan: Home when medically stable   IV Access:    Peripheral IV Procedures and diagnostic studies:    Ct Abdomen Pelvis W Contrast 02/16/2014 1. New multifocal mass-like areas of heterogeneous enhancement within the liver. In the absence of a known malignancy, and with the history of fever and elevated white blood cell count, ? multifocal infection with extensive microabscess. However, other differential considerations include intrahepatic pseudocysts, and metastatic disease to the liver. Correlation with biopsy is recommended if clinically appropriate.  2. Large amount of abnormal soft tissue in the porta hepatis which encases numerous vascular structures and extends downward toward the head of the pancreas. It is uncertain whether or not this is an infiltrative malignancy such as a cholangiocarcinoma, cephalad migration of a primary pancreatic malignancy, malignant lymphadenopathy, or simply a combination of extensive scar tissue and infiltrative atypical appearing pseudocyst related to chronic pancreatitis with numerous dilated vessels from cavernous transformation in the porta hepatis. This does appear to  be causing some biliary tract obstruction as evidenced by the mild intrahepatic biliary ductal dilatation discussed above. Correlation with endoscopic ultrasound and carefully placed biopsy of this tissue (to avoid the very large varices in this region) is suggested if clinically appropriate.  3. 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  4. Persistent linear high-density structure extending from the pancreatic head into the second portion of the duodenum. Given the history of extraction of a stent from the minor duct, this is presumably either some chronic dystrophic calcification in the ductal wall around where the prior stent was placed, or could represent a small fragment of the previously retrieved stent. Attention to this region at time of follow-up endoscopy is suggested.  5. Sequela of chronic pancreatitis redemonstrated, with resolution of the previously noted multi-cystic mass like area in the head of the pancreas compared to the prior examination, which was presumably a pancreatic pseudocyst. 6. Additional incidental findings, as above.  Medical Consultants:    GI Other Consultants:    None  Anti-Infectives:    Zosyn 11/25 -->   Faye Ramsay, MD  [facility] Pager [phone]  If 7PM-7AM, please contact night-coverage [email or web address] Password [facility] 02/18/2014, 11:39 AM   LOS: 2 days   HPI/Subjective: No events overnight.   Objective: Filed Vitals:   02/18/14 0118 02/18/14 0226 02/18/14 0425 02/18/14 0546  BP: 155/84   97/65  Pulse: 103   69  Temp: 100.6 F (38.1 C) 102.2 F (39 C) 98.2 F (36.8 C) 97.8 F (36.6 C)  TempSrc: Oral Oral Oral Oral  Resp:    18  Height:      Weight:      SpO2: 99%   98%    Intake/Output Summary (Last 24 hours) at 02/18/14 1139 Last data filed at 02/18/14 7048  Gross per 24 hour  Intake   2050 ml  Output      0 ml  Net   2050 ml    Exam:   General:  Pt is alert, follows commands appropriately, not in acute distress  Cardiovascular: Regular rate and rhythm, S1/S2, no murmurs, no rubs, no gallops  Respiratory: Clear to auscultation bilaterally, no wheezing, no crackles, no rhonchi  Abdomen: Soft, non tender, non distended, bowel sounds present, no guarding  Extremities: No edema, pulses DP and PT palpable  bilaterally  Neuro: Grossly nonfocal  Data Reviewed: Basic Metabolic Panel:  Recent Labs Lab 02/15/14 1522 02/16/14 1430 02/17/14 0505 02/18/14 0418  NA 131*  --  138 138  K 3.5  --  3.9 3.3*  CL 98  --  102 102  CO2 22  --  22 23  GLUCOSE 148*  --  106* 104*  BUN 13  --  11 12  CREATININE 0.8 0.84 0.71 0.77  CALCIUM 8.0*  --  8.8 8.2*  MG  --  2.2  --   --    Liver Function Tests:  Recent Labs Lab 02/15/14 1522 02/17/14 0505 02/18/14 0418  AST 65* 58* 35  ALT 90* 93* 61*  ALKPHOS 982* 1095* 798*  BILITOT 3.7* 2.8* 3.0*  PROT 7.8 7.7 6.6  ALBUMIN 2.1* 1.7* 1.6*    Recent Labs Lab 02/15/14 1522  LIPASE 20.0   CBC:  Recent Labs Lab 02/15/14 1522 02/16/14 1430 02/16/14 1820 02/17/14 0505 02/18/14 0418  WBC 18.6* 16.5* 14.8* 13.6* 16.3*  NEUTROABS 16.7*  --   --   --   --   HGB 10.1* 7.9* 10.7* 10.3* 8.6*  HCT 30.2* 24.2* 32.5* 31.6* 25.4*  MCV 89.3 89.3 90.0 91.6 90.7  PLT 276.0 221 214 205 170     Recent Results (from the past 240 hour(s))  Urine culture     Status: None   Collection Time: 02/08/14  7:47 PM  Result Value Ref Range Status   Specimen Description URINE, RANDOM  Final   Special Requests NONE  Final   Culture  Setup Time   Final    02/09/2014 01:22 Performed at [place] Performed at [facility]   Final   Culture NO GROWTH Performed at [facility]   Final   Report Status 02/09/2014 FINAL  Final  Culture, blood (routine x 2)     Status: None (Preliminary result)   Collection Time: 02/16/14  3:40 PM  Result Value Ref Range Status   Specimen Description BLOOD LEFT ARM  Final   Special Requests BOTTLES DRAWN AEROBIC ONLY [place]  Final   Culture  Setup Time   Final    02/16/2014 21:50 Performed at [facility]    Culture   Final           BLOOD CULTURE RECEIVED NO GROWTH TO DATE CULTURE WILL BE HELD FOR 5 DAYS BEFORE ISSUING A FINAL NEGATIVE REPORT Performed at  [facility]    Report Status PENDING  Incomplete  Culture, blood (routine x 2)     Status: None (Preliminary result)   Collection Time: 02/16/14  3:46 PM  Result Value Ref Range Status   Specimen Description BLOOD RIGHT ARM  Final   Special Requests BOTTLES DRAWN AEROBIC AND ANAEROBIC 10CC  Final   Culture  Setup Time   Final    02/16/2014 21:42 Performed at [facility]    Culture   Final           BLOOD CULTURE RECEIVED NO GROWTH TO DATE CULTURE WILL BE HELD FOR 5 DAYS BEFORE ISSUING A FINAL NEGATIVE REPORT Performed at [facility]    Report Status PENDING  Incomplete     Scheduled Meds: . docusate sodium  100 mg Oral BID  . enoxaparin (LOVENOX) injection  40 mg Subcutaneous Q24H  . indomethacin  100 mg Rectal UD  . [START ON 02/19/2014] pantoprazole (PROTONIX) IV  40 mg Intravenous Q12H  . piperacillin-tazobactam (ZOSYN)  IV  3.375 g Intravenous Q8H  . sodium chloride  3 mL Intravenous Q12H   Continuous Infusions: . sodium chloride 75 mL/hr at 02/18/14 0647  . lactated ringers    . pantoprozole (PROTONIX) infusion 8 mg/hr (02/18/14 0834)

## 2014-02-18 NOTE — Transfer of Care (Signed)
Immediate Anesthesia Transfer of Care Note  Patient: Lawrence Brock  Procedure(s) Performed: Procedure(s): ENDOSCOPIC RETROGRADE CHOLANGIOPANCREATOGRAPHY (ERCP) (N/A)  Patient Location: PACU  Anesthesia Type:General  Level of Consciousness: awake, alert , oriented and patient cooperative  Airway & Oxygen Therapy: Patient Spontanous Breathing and Patient connected to face mask oxygen  Post-op Assessment: Report given to PACU RN and Post -op Vital signs reviewed and stable  Post vital signs: Reviewed and stable  Complications: No apparent anesthesia complications

## 2014-02-18 NOTE — Anesthesia Postprocedure Evaluation (Signed)
Anesthesia Post Note  Patient: Lawrence Brock  Procedure(s) Performed: Procedure(s) (LRB): ENDOSCOPIC RETROGRADE CHOLANGIOPANCREATOGRAPHY (ERCP) (N/A)  Anesthesia type: GA  Patient location: PACU  Post pain: Pain level controlled  Post assessment: Post-op Vital signs reviewed  Last Vitals:  Filed Vitals:   02/18/14 1651  BP: 110/77  Pulse: 73  Temp: 36.4 C  Resp: 18    Post vital signs: Reviewed  Level of consciousness: sedated  Complications: No apparent anesthesia complications

## 2014-02-18 NOTE — Progress Notes (Signed)
INITIAL NUTRITION ASSESSMENT  DOCUMENTATION CODES Per approved criteria  Unable to assess   INTERVENTION: 1.  General healthful diet; encourage intake of foods and beverages as able once diet is resumed.  RD to follow and assess for nutritional adequacy.   NUTRITION DIAGNOSIS: Inadequate oral intake related to omission of energy dense foods as evidenced by NPO, pain.  Monitor:  1.  Food/Beverage; diet resumed with pt meeting >/=90% estimated needs with tolerance. 2.  Wt/wt change; monitor trends  Reason for Assessment: MST  43 y.o. male  Admitting Dx: pancreatitis  ASSESSMENT: Pt with h/o pancreatitis, admitted with abdominal pain. Pt with possible hepatic abscesses with worsening biliary dilation.  Pt is off unit for ERCP.  RD unable to complete nutrition assessment at this time.  Will follow for ongoing nutrition-related interventions. Last known weight was 150 lbs in June/July of 2015.  Admission weight 139 lbs.   Height: Ht Readings from Last 1 Encounters:  02/16/14 5\' 8"  (1.727 m)    Weight: Wt Readings from Last 1 Encounters:  02/16/14 139 lb (63.05 kg)    Ideal Body Weight: 70 kg  % Ideal Body Weight: 90%  Wt Readings from Last 10 Encounters:  02/16/14 139 lb (63.05 kg)  02/15/14 139 lb 2 oz (63.107 kg)  09/08/13 149 lb 11.2 oz (67.903 kg)  07/19/11 160 lb (72.576 kg)    Usual Body Weight: unknown, possibly 150 lbs+  % Usual Body Weight: unknown  BMI:  Body mass index is 21.14 kg/(m^2).  Estimated Nutritional Needs: Kcal: 2050-2200 Protein: 90-105g Fluid: ~2.0 L/day  Skin: intact, jaundice  Diet Order: Diet NPO time specified  EDUCATION NEEDS: -Education not appropriate at this time   Intake/Output Summary (Last 24 hours) at 02/18/14 1056 Last data filed at 02/18/14 0642  Gross per 24 hour  Intake   2050 ml  Output      0 ml  Net   2050 ml    Last BM: PTA   Labs:   Recent Labs Lab 02/15/14 1522 02/16/14 1430 02/17/14 0505  02/18/14 0418  NA 131*  --  138 138  K 3.5  --  3.9 3.3*  CL 98  --  102 102  CO2 22  --  22 23  BUN 13  --  11 12  CREATININE 0.8 0.84 0.71 0.77  CALCIUM 8.0*  --  8.8 8.2*  MG  --  2.2  --   --   GLUCOSE 148*  --  106* 104*    CBG (last 3)  No results for input(s): GLUCAP in the last 72 hours.  Scheduled Meds: . docusate sodium  100 mg Oral BID  . enoxaparin (LOVENOX) injection  40 mg Subcutaneous Q24H  . indomethacin  100 mg Rectal UD  . [START ON 02/19/2014] pantoprazole (PROTONIX) IV  40 mg Intravenous Q12H  . piperacillin-tazobactam (ZOSYN)  IV  3.375 g Intravenous Q8H  . sodium chloride  3 mL Intravenous Q12H    Continuous Infusions: . sodium chloride 75 mL/hr at 02/18/14 0647  . lactated ringers    . pantoprozole (PROTONIX) infusion 8 mg/hr (02/18/14 4825)    Past Medical History  Diagnosis Date  . Pancreatitis   . Chronic back pain   . Chronic pancreatitis 09/10/2013    Past Surgical History  Procedure Laterality Date  . Appendectomy    . Esophagogastroduodenoscopy (egd) with propofol N/A 09/14/2013    Procedure: ESOPHAGOGASTRODUODENOSCOPY (EGD) WITH PROPOFOL;  Surgeon: Jerene Bears, MD;  Location: [facility];  Service: Gastroenterology;  Laterality: N/A;  . Gastrointestinal stent removal N/A 09/14/2013    Procedure: GASTROINTESTINAL STENT REMOVAL;  Surgeon: Jerene Bears, MD;  Location: [facility] ENDOSCOPY;  Service: Gastroenterology;  Laterality: N/A;  pancreatic stent removal    Brynda Greathouse, MS RD LDN Clinical Inpatient Dietitian Weekend/After hours pager: [phone]

## 2014-02-18 NOTE — Progress Notes (Signed)
Progress Note   Subjective  *Still does not feel well.  Had episode of chills last eve.**   Objective  Vital signs in last 24 hours: Temp:  [97.8 F (36.6 C)-102.2 F (39 C)] 97.8 F (36.6 C) (11/27 0546) Pulse Rate:  [63-103] 69 (11/27 0546) Resp:  [16-18] 18 (11/27 0546) BP: (96-155)/(64-84) 97/65 mmHg (11/27 0546) SpO2:  [97 %-100 %] 98 % (11/27 0546)   General:   Alert,  Well-developed,   in NAD Heart:  Regular rate and rhythm; no murmurs Abdomen:  Soft, nontender and nondistended. Normal bowel sounds, without guarding, and without rebound.   Extremities:  Without edema. Neurologic:  Alert and  oriented x4;  grossly normal neurologically. Psych:  Alert and cooperative. Normal mood and affect.  Intake/Output from previous day: 11/26 0701 - 11/27 0700 In: 2050 [I.V.:1950; IV Piggyback:100] Out: -  Intake/Output this shift:    Lab Results:  Recent Labs  02/16/14 1820 02/17/14 0505 02/18/14 0418  WBC 14.8* 13.6* 16.3*  HGB 10.7* 10.3* 8.6*  HCT 32.5* 31.6* 25.4*  PLT 214 205 170   BMET  Recent Labs  02/15/14 1522 02/16/14 1430 02/17/14 0505 02/18/14 0418  NA 131*  --  138 138  K 3.5  --  3.9 3.3*  CL 98  --  102 102  CO2 22  --  22 23  GLUCOSE 148*  --  106* 104*  BUN 13  --  11 12  CREATININE 0.8 0.84 0.71 0.77  CALCIUM 8.0*  --  8.8 8.2*   LFT  Recent Labs  02/18/14 0418  PROT 6.6  ALBUMIN 1.6*  AST 35  ALT 61*  ALKPHOS 798*  BILITOT 3.0*   PT/INR No results for input(s): LABPROT, INR in the last 72 hours. Hepatitis Panel No results for input(s): HEPBSAG, HCVAB, HEPAIGM, HEPBIGM in the last 72 hours.  Studies/Results: Ct Abdomen Pelvis W Contrast  02/16/2014   CLINICAL DATA:  43 year old male with elevated liver function tests, history of pancreatitis, 3 week history of abdominal pain, weight loss and fever. Elevated white blood cell count.  EXAM: CT ABDOMEN AND PELVIS WITH CONTRAST  TECHNIQUE: Multidetector CT imaging of the  abdomen and pelvis was performed using the standard protocol following bolus administration of intravenous contrast.  CONTRAST:  137mL OMNIPAQUE IOHEXOL 300 MG/ML  SOLN  COMPARISON:  CT of the abdomen and pelvis 09/08/2013.  FINDINGS: Lower chest: Multifocal linear opacities in the lung bases bilaterally, compatible with areas of scarring and/or subsegmental atelectasis.  Hepatobiliary: Compared to the prior examination there are multiple new areas of heterogeneous hypoattenuation in the liver which are somewhat masslike in appearance. These include an ill-defined area in segments 8 and 4A measuring approximately 5.2 x 4.0 cm (image 7 of series 7), a lesion in the central aspect of predominantly segment 8 (image 15 of series 7), and an elongated lesion which appears to involve portions of both segments 7 and 1 (image 21 of series 7) measuring 9.9 x 3.8 cm. Several other smaller lesions are also noted. 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 In addition, there is some mild intrahepatic biliary ductal dilatation. Importantly, the confluence of the intrahepatic bile ducts, common hepatic duct and proximal common bile duct are well visualized, and the proximal common bile duct  measures only 7 mm in diameter. This does appear focally narrowed as it transitions toward the head of the pancreas in the porta hepatis, best appreciated on image 25 of series 7, likely narrowed from mass effect from the multiple enlarged vessels in this region. The possibility of a centrally obstructing neoplasm in the hepatic hilum is difficult to exclude, although no discrete mass is confidently identified at this time. Gallbladder is moderately distended, but there are no gallstones, no  gallbladder wall thickening and no surrounding inflammatory changes.  Pancreas: Previously noted complex cystic lesion in the head and proximal body of the pancreas has largely resolved, presumably a pancreatic pseudocyst on the prior examination. There continues to be changes of chronic pancreatitis with extensive multifocal calcifications throughout the pancreas, chronic pancreatic ductal dilatation, and severe atrophy of the body and tail of the pancreas. Large calcifications are noted in the proximal body and head of the pancreas, and there is a linear calcification or other high density material which appears to extend into the second portion of the duodenum best appreciated on images 32 and 33. Per report, the patient recently had a stent removed from the minor pancreatic duct. This high density material could represent a fragment of the stent, or could simply represent some dystrophic calcification in the ductal wall which formed around the previous chronically indwelling stent.  Spleen: The spleen is markedly enlarged measuring 16.4 x 5.7 x 13.2 cm (estimated splenic volume of 617 mL).  Adrenals/Urinary Tract: Sub cm low-attenuation lesions in the kidneys bilaterally are too small to definitively characterize, but are statistically favored to represent small cysts. In addition, there is a 3.2 cm simple cyst in the lower pole of the left kidney. No hydroureteronephrosis. Urinary bladder is normal in appearance. Bilateral adrenal glands are normal in appearance.  Stomach/Bowel: Normal appearance of the stomach. No pathologic dilatation of small bowel or colon.  Vascular/Lymphatic: The distal aspect of the splenic vein is occluded shortly before the splenoportal confluence. 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 The distal portal vein is reconstituted in the hepatic hilum, or there is extensive  cavernous transformation (the largest varix in this region measures up to 1.5 cm in diameter). The right main portal vein branch appears widely patent, while the left main portal vein branch appears diminutive (image 19 of series 7), and there is low-attenuation surrounding these main portal vein branches extending outward from the hepatic hilum. Whether or not this represents infection, periportal edema, or underlying neoplasm is uncertain on today's examination. Mild atherosclerosis throughout the abdominal and pelvic vasculature, without evidence of arterial aneurysm or dissection. Coronal image 37 of series 604 demonstrates abnormal soft tissue extending downward from the hepatic hilum toward the head of the pancreas, encasing numerous vascular structures. Whether or not this is lymphadenopathy, chronic scarring related to chronic pancreatitis, pancreatic neoplasm, an infiltrative atypical appearing pseudocyst or other neoplasm such as cholangiocarcinoma is difficult to ascertain.  Reproductive: Prostate gland and seminal vesicles are unremarkable in appearance.  Other: Trace volume of perihepatic ascites tracking downward in the right pericolic gutter. No pneumoperitoneum.  Musculoskeletal: There are no aggressive appearing lytic or blastic lesions noted in the visualized portions of the skeleton.  IMPRESSION: 1. Today's study demonstrates new multifocal mass-like areas of heterogeneous enhancement within the liver. In the absence of a known malignancy, and with the history of fever and elevated white blood cell count, these  findings are suspicious for multifocal infection with extensive microabscess ease. However, other differential considerations include intrahepatic pseudocysts, and metastatic disease to the liver. Correlation with biopsy is recommended if clinically appropriate. 2. In addition, there is a large amount of abnormal soft tissue in the porta hepatis which encases numerous vascular structures  and extends downward toward the head of the pancreas. It is uncertain whether or not this is an infiltrative malignancy such as a cholangiocarcinoma, cephalad migration of a primary pancreatic malignancy, malignant lymphadenopathy, or simply a combination of extensive scar tissue and infiltrative atypical appearing pseudocyst related to chronic pancreatitis with numerous dilated vessels from cavernous transformation in the porta hepatis. This does appear to be causing some biliary tract obstruction as evidenced by the mild intrahepatic biliary ductal dilatation discussed above. Correlation with endoscopic ultrasound and carefully placed biopsy of this tissue (to avoid the very large varices in this region) is suggested if clinically appropriate. 3. 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 4. Persistent linear high-density structure extending from the pancreatic head into the second portion of the duodenum. Given the history of extraction of a stent from the minor duct, this is presumably either some chronic dystrophic calcification in the ductal wall around where the prior stent was placed, or could represent a small fragment of the previously retrieved stent. Attention to this region at time of follow-up endoscopy is suggested. 5. Sequela of chronic pancreatitis redemonstrated, with resolution of the previously noted multi-cystic mass like area in the head of the pancreas compared to the prior examination, which was presumably a pancreatic pseudocyst. 6. Additional incidental findings, as above.   Electronically Signed   By: Vinnie Langton M.D.   On: 02/16/2014 12:30      Assessment & Plan  *Hepatic abscesses.  I reviewed the x-rays with radiology.  It appears that a piece of pancreas stent remains but I don't think this is causing clinical problems.  Changes in the pancreatic head are resolving with no evidence of infection.  He has worsening biliary dilation.  I suspect that he has  clinical cholangitis as the cause of the abscesses.  He may have stricturing of the distal common bile duct related to chronic pancreatitis.  Plan ERCP today  The risks, benefits, and possible complications of the procedure, including  But not limited to bleeding, perforation, surgery, and the 5-10% risk for pancreatitis, were explained to the patient.  It was explained that  post ERCP pancreatitis which, while usually is mild, occasionally maybe severe and even life-threatening.  Patient's questions were answered. ** Active Problems:   Hepatic abscess     LOS: 2 days   Erskine Emery  02/18/2014, 10:29 AM

## 2014-02-18 NOTE — Anesthesia Preprocedure Evaluation (Addendum)
Anesthesia Evaluation  Patient identified by MRN, date of birth, ID band Patient awake    Reviewed: Allergy & Precautions, H&P , Patient's Chart, lab work & pertinent test results, reviewed documented beta blocker date and time   History of Anesthesia Complications Negative for: history of anesthetic complications  Airway Mallampati: II  TM Distance: >3 FB Neck ROM: full    Dental   Pulmonary Current Smoker,  breath sounds clear to auscultation        Cardiovascular Exercise Tolerance: Good Rhythm:regular Rate:Normal     Neuro/Psych    GI/Hepatic PUD,   Endo/Other    Renal/GU      Musculoskeletal   Abdominal   Peds  Hematology  (+) anemia ,   Anesthesia Other Findings   Reproductive/Obstetrics                            Anesthesia Physical Anesthesia Plan  ASA: III  Anesthesia Plan: General ETT   Post-op Pain Management:    Induction: Cricoid pressure planned, Rapid sequence and Intravenous  Airway Management Planned: Oral ETT  Additional Equipment:   Intra-op Plan:   Post-operative Plan:   Informed Consent: I have reviewed the patients History and Physical, chart, labs and discussed the procedure including the risks, benefits and alternatives for the proposed anesthesia with the patient or authorized representative who has indicated his/her understanding and acceptance.   Dental Advisory Given  Plan Discussed with: CRNA and Surgeon  Anesthesia Plan Comments:         Anesthesia Quick Evaluation

## 2014-02-18 NOTE — Progress Notes (Signed)
Pt currently alert and ambulating to the bathroom. Pt states he is not aching, does not have chills at this time, and is not SOB. Pt denies any complaints of pain at this time.

## 2014-02-18 NOTE — Progress Notes (Signed)
Pt states that he has the chills and is shaking all over. Pt states he feels like he is having a heard time breathing due to the severity of shaking. Pt has no complaints of pain at this time. Pt given 800mg  of ibuprofen will continue to monitor and  recheck temperature   02/18/14 0118  Vitals  Temp (!) 100.6 F (38.1 C)  Temp Source Oral  BP (!) 155/84 mmHg  BP Location Left Arm  BP Method Automatic  Patient Position (if appropriate) Lying  Pulse Rate (!) 103  Pulse Rate Source Dinamap  Oxygen Therapy  SpO2 99 %  O2 Device Room Air

## 2014-02-19 LAB — COMPREHENSIVE METABOLIC PANEL
ALK PHOS: 876 U/L — AB (ref 39–117)
ALT: 70 U/L — AB (ref 0–53)
AST: 65 U/L — AB (ref 0–37)
Albumin: 1.5 g/dL — ABNORMAL LOW (ref 3.5–5.2)
Anion gap: 10 (ref 5–15)
BILIRUBIN TOTAL: 3.2 mg/dL — AB (ref 0.3–1.2)
BUN: 12 mg/dL (ref 6–23)
CHLORIDE: 102 meq/L (ref 96–112)
CO2: 24 mEq/L (ref 19–32)
Calcium: 8.1 mg/dL — ABNORMAL LOW (ref 8.4–10.5)
Creatinine, Ser: 0.76 mg/dL (ref 0.50–1.35)
GFR calc Af Amer: >90 mL/min (ref >90)
GFR calc non Af Amer: >90 mL/min (ref >90)
Glucose, Bld: 104 mg/dL — ABNORMAL HIGH (ref 70–99)
POTASSIUM: 3.7 meq/L (ref 3.7–5.3)
SODIUM: 136 meq/L — AB (ref 137–147)
TOTAL PROTEIN: 6.3 g/dL (ref 6.0–8.3)

## 2014-02-19 LAB — CBC
HCT: 25.5 % — ABNORMAL LOW (ref 39.0–52.0)
Hemoglobin: 8.7 g/dL — ABNORMAL LOW (ref 13.0–17.0)
MCH: 31 pg (ref 26.0–34.0)
MCHC: 34.1 g/dL (ref 30.0–36.0)
MCV: 90.7 fL (ref 78.0–100.0)
Platelets: 164 10*3/uL (ref 150–400)
RBC: 2.81 MIL/uL — ABNORMAL LOW (ref 4.22–5.81)
RDW: 15.1 % (ref 11.5–15.5)
WBC: 11.4 10*3/uL — ABNORMAL HIGH (ref 4.0–10.5)

## 2014-02-19 MED ORDER — MENTHOL 3 MG MT LOZG
1.0000 | LOZENGE | OROMUCOSAL | Status: DC | PRN
  Filled 2014-02-19: qty 9

## 2014-02-19 MED ORDER — PHENOL 1.4 % MT LIQD
1.0000 | OROMUCOSAL | Status: DC | PRN
  Administered 2014-02-19: 1 via OROMUCOSAL
  Filled 2014-02-19: qty 177

## 2014-02-19 MED ORDER — IBUPROFEN 200 MG PO TABS
200.0000 mg | ORAL_TABLET | Freq: Two times a day (BID) | ORAL | Status: DC | PRN
  Administered 2014-02-19 – 2014-02-21 (×4): 200 mg via ORAL
  Filled 2014-02-19 (×4): qty 1

## 2014-02-19 NOTE — Progress Notes (Signed)
Patient ID: Lawrence Brock, male   DOB: 1970-11-27, 43 y.o.   MRN: [id number]  [facility] PROGRESS NOTE  Lawrence Brock WUJ:[id number] DOB: [birth year] DOA: 02/16/2014 PCP: [name], MD  Brief narrative: 43 year old male who is known remotely to Dr. 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 At that time he had a pancreatitic stent in place that was placed in [place] and had been in place for several years. It was removed with snare during EGD on 6/23 by Dr. Hilarie Fredrickson at which time he was also found to have single non-bleeding ulcer in the duodenal bulb. Biopsies were negative for Hpylori. He was supposed to follow-up for repeat imaging in 6-8 weeks but failed to do so. Presented 11/24 with complaints of intermittent upper abdominal pains and fevers up to 101. Is drinking a lot of fluids but not eating much. Denies any ETOH since May, prior to his hospitalization. He has been taking tylenol for fever and no more than 1 Oxycodone per day for severe pain. 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 LFT's were elevated at AST 65, ALT 90, ALP 982, and total bili 3.7,. Increased from 11/17.Patient sent from Dr [name] office for further eval. Patient states he has not had alcohol since his discharge in June, denies any weight loss  Assessment and Plan:    Active Problems:  ? Hepatic abscess - ? portal hepatis mass obstructing the area - management per GI team, appreciate assistance - continue Zosyn day #4 and repeat CBC in AM  J-shaped calcification in his pancreas/? Clinical cholangitis  - ? retained stent or dystrophic calcification of the duct - ERCP notable for narrowing within CBD with subsequent stent placement,  narrowing related to extrinsic compression from the lesions seen recently in the liver on CT  - management per GI team  Hypokalemia - supplemented, repeat BMP in AM  Transaminitis - secondary to the above - monitor   Leukocytosis with fever, SIRS - secondary to principal problem  - WBC trending down - continue Zosyn as noted above   Acute on chronic blood loss anemia  - possible related to the above as well - no signs of active bleeding, Hg trend: 10.3 --> 8.6 --> 8.7  - repeat CBC in AM  DVT prophylaxis  Changed Lovenox to SCD's 11/27  Code Status: Full Family Communication: Pt at bedside Disposition Plan: Home when medically stable   IV Access:    Peripheral IV Procedures and diagnostic studies:    Ct Abdomen Pelvis W Contrast 02/16/2014 1. New multifocal mass-like areas of heterogeneous enhancement within the liver. In the absence of a known malignancy, and with the history of fever and elevated white blood cell count, ? multifocal infection with extensive microabscess. However, other differential considerations include intrahepatic pseudocysts, and metastatic disease to the liver. Correlation with biopsy is recommended if clinically appropriate.  2. Large amount of abnormal soft tissue in the porta hepatis which encases numerous vascular structures and extends downward toward the head of the pancreas. It is uncertain whether or not this is an infiltrative malignancy such as a cholangiocarcinoma, cephalad migration of a primary pancreatic malignancy, malignant lymphadenopathy, or simply a combination of extensive scar tissue and infiltrative atypical appearing pseudocyst related to chronic  pancreatitis with numerous dilated vessels from cavernous transformation in the porta hepatis. This does appear to be causing some biliary tract obstruction as evidenced by the mild intrahepatic biliary ductal dilatation discussed above. Correlation with endoscopic ultrasound  and carefully placed biopsy of this tissue (to avoid the very large varices in this region) is suggested if clinically appropriate.  3. 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  4. Persistent linear high-density structure extending from the pancreatic head into the second portion of the duodenum. Given the history of extraction of a stent from the minor duct, this is presumably either some chronic dystrophic calcification in the ductal wall around where the prior stent was placed, or could represent a small fragment of the previously retrieved stent. Attention to this region at time of follow-up endoscopy is suggested.  5. Sequela of chronic pancreatitis redemonstrated, with resolution of the previously noted multi-cystic mass like area in the head of the pancreas compared to the prior examination, which was presumably a pancreatic pseudocyst. 6. Additional incidental findings, as above.  Dg Ercp  02/18/2014   Narrowing within the common bile duct with subsequent stent placement. The narrowing appears to be related to extrinsic compression from the lesions seen recently in the liver on CT  Medical Consultants:    GI Other Consultants:    None  Anti-Infectives:    Zosyn 11/25 -->  Faye Ramsay, MD  [facility] Pager [phone]  If 7PM-7AM, please contact night-coverage [email or web address] Password [facility] 02/19/2014, 12:27 PM   LOS: 3 days   HPI/Subjective: No events overnight.   Objective: Filed Vitals:   02/18/14 2255 02/18/14 2326 02/19/14 0632 02/19/14 1110  BP:   105/75   Pulse:   75 82  Temp: 102.6 F (39.2 C) 100.6 F (38.1 C) 98.5 F (36.9 C) 99.5 F (37.5 C)  TempSrc:   Oral Oral  Resp:   18   Height:      Weight:      SpO2:   98%     Intake/Output Summary (Last 24 hours) at 02/19/14 1227 Last data filed at 02/19/14 0820  Gross per 24 hour  Intake 3067.5 ml  Output      0 ml  Net 3067.5 ml    Exam:   General:  Pt is alert, follows  commands appropriately, not in acute distress  Cardiovascular: Regular rate and rhythm, S1/S2, no murmurs, no rubs, no gallops  Respiratory: Clear to auscultation bilaterally, no wheezing, no crackles, no rhonchi  Abdomen: Soft, tender in upper abd quadrants, non distended, bowel sounds present, no guarding  Extremities: No edema, pulses DP and PT palpable bilaterally  Neuro: Grossly nonfocal  Data Reviewed: Basic Metabolic Panel:  Recent Labs Lab 02/15/14 1522 02/16/14 1430 02/17/14 0505 02/18/14 0418 02/19/14 0529  NA 131*  --  138 138 136*  K 3.5  --  3.9 3.3* 3.7  CL 98  --  102 102 102  CO2 22  --  22 23 24   GLUCOSE 148*  --  106* 104* 104*  BUN 13  --  11 12 12   CREATININE 0.8 0.84 0.71 0.77 0.76  CALCIUM 8.0*  --  8.8 8.2* 8.1*  MG  --  2.2  --   --   --    Liver Function Tests:  Recent Labs Lab 02/15/14 1522 02/17/14 0505 02/18/14 0418 02/19/14 0529  AST 65* 58* 35 65*  ALT 90* 93* 61* 70*  ALKPHOS 982* 1095* 798* 876*  BILITOT 3.7* 2.8* 3.0*  3.2*  PROT 7.8 7.7 6.6 6.3  ALBUMIN 2.1* 1.7* 1.6* 1.5*    Recent Labs Lab 02/15/14 1522  LIPASE 20.0   No results for input(s): AMMONIA in the last 168 hours. CBC:  Recent Labs Lab 02/15/14 1522 02/16/14 1430 02/16/14 1820 02/17/14 0505 02/18/14 0418 02/19/14 0529  WBC 18.6* 16.5* 14.8* 13.6* 16.3* 11.4*  NEUTROABS 16.7*  --   --   --   --   --   HGB 10.1* 7.9* 10.7* 10.3* 8.6* 8.7*  HCT 30.2* 24.2* 32.5* 31.6* 25.4* 25.5*  MCV 89.3 89.3 90.0 91.6 90.7 90.7  PLT 276.0 221 214 205 170 164    Recent Results (from the past 240 hour(s))  Culture, blood (routine x 2)     Status: None (Preliminary result)   Collection Time: 02/16/14  3:40 PM  Result Value Ref Range Status   Specimen Description BLOOD LEFT ARM  Final   Special Requests BOTTLES DRAWN AEROBIC ONLY [place]  Final   Culture  Setup Time   Final    02/16/2014 21:50 Performed at [facility]    Culture   Final           BLOOD  CULTURE RECEIVED NO GROWTH TO DATE CULTURE WILL BE HELD FOR 5 DAYS BEFORE ISSUING A FINAL NEGATIVE REPORT Performed at [facility]    Report Status PENDING  Incomplete  Culture, blood (routine x 2)     Status: None (Preliminary result)   Collection Time: 02/16/14  3:46 PM  Result Value Ref Range Status   Specimen Description BLOOD RIGHT ARM  Final   Special Requests BOTTLES DRAWN AEROBIC AND ANAEROBIC 10CC  Final   Culture  Setup Time   Final    02/16/2014 21:42 Performed at [facility]    Culture   Final           BLOOD CULTURE RECEIVED NO GROWTH TO DATE CULTURE WILL BE HELD FOR 5 DAYS BEFORE ISSUING A FINAL NEGATIVE REPORT Performed at [facility]    Report Status PENDING  Incomplete     Scheduled Meds: . docusate sodium  100 mg Oral BID  . pantoprazole (PROTONIX) IV  40 mg Intravenous Q12H  . piperacillin-tazobactam (ZOSYN)  IV  3.375 g Intravenous Q8H  . sodium chloride  3 mL Intravenous Q12H   Continuous Infusions: . 0.9 % NaCl with KCl 40 mEq / L 75 mL/hr (02/18/14 1355)  . lactated ringers

## 2014-02-19 NOTE — Progress Notes (Signed)
Progress Note for [facility]  Subjective: No acute events.  Still febrile.  Some minor abdominal pain post procedure.  Objective: Vital signs in last 24 hours: Temp:  [97.4 F (36.3 C)-103.1 F (39.5 C)] 98.5 F (36.9 C) (11/28 0737) Pulse Rate:  [64-99] 75 (11/28 0632) Resp:  [16-21] 18 (11/28 1062) BP: (97-142)/(65-90) 105/75 mmHg (11/28 0632) SpO2:  [94 %-100 %] 98 % (11/28 6948) Last BM Date: 02/18/14  Intake/Output from previous day: 11/27 0701 - 11/28 0700 In: 2827.5 [P.O.:720; I.V.:2007.5; IV Piggyback:100] Out: -  Intake/Output this shift: Total I/O In: 1357.5 [I.V.:1257.5; IV Piggyback:100] Out: -   General appearance: alert and no distress GI: mild RUQ/epigastric tenderness  Lab Results:  Recent Labs  02/17/14 0505 02/18/14 0418 02/19/14 0529  WBC 13.6* 16.3* 11.4*  HGB 10.3* 8.6* 8.7*  HCT 31.6* 25.4* 25.5*  PLT 205 170 164   BMET  Recent Labs  02/17/14 0505 02/18/14 0418 02/19/14 0529  NA 138 138 136*  K 3.9 3.3* 3.7  CL 102 102 102  CO2 22 23 24   GLUCOSE 106* 104* 104*  BUN 11 12 12   CREATININE 0.71 0.77 0.76  CALCIUM 8.8 8.2* 8.1*   LFT  Recent Labs  02/19/14 0529  PROT 6.3  ALBUMIN 1.5*  AST 65*  ALT 70*  ALKPHOS 876*  BILITOT 3.2*   PT/INR No results for input(s): LABPROT, INR in the last 72 hours. Hepatitis Panel No results for input(s): HEPBSAG, HCVAB, HEPAIGM, HEPBIGM in the last 72 hours. C-Diff No results for input(s): CDIFFTOX in the last 72 hours. Fecal Lactopherrin No results for input(s): FECLLACTOFRN in the last 72 hours.  Studies/Results: Dg Ercp  02/18/2014   CLINICAL DATA:  Multifocal hepatic abscesses, pancreatitis history  EXAM: ERCP  TECHNIQUE: Multiple spot images obtained with the fluoroscopic device and submitted for interpretation post-procedure.  COMPARISON:  02/16/2014  FINDINGS: Initial images demonstrate a small pancreatic duct stent. Injection in the common bile duct demonstrates narrowing in the  mid to distal common bile duct causing more proximal obstructive change similar to that seen on recent CT. A plastic stent was then placed in the common bile duct across the narrowing. The pancreatic ductal stent appears stable.  FLUOROSCOPY TIME:  7 min 30 seconds  IMPRESSION: Narrowing within the common bile duct with subsequent stent placement. The narrowing appears to be related to extrinsic compression from the lesions seen recently in the liver on CT  These images were submitted for radiologic interpretation only. Please see the procedural report for the amount of contrast and the fluoroscopy time utilized.   Electronically Signed   By: Inez Catalina M.D.   On: 02/18/2014 13:15    Medications:  Scheduled: . docusate sodium  100 mg Oral BID  . pantoprazole (PROTONIX) IV  40 mg Intravenous Q12H  . piperacillin-tazobactam (ZOSYN)  IV  3.375 g Intravenous Q8H  . sodium chloride  3 mL Intravenous Q12H   Continuous: . 0.9 % NaCl with KCl 40 mEq / L 75 mL/hr (02/18/14 1355)  . lactated ringers      Assessment/Plan: 1) Hepatic abscesses. 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 3) Partial retained stent in the minor pancreatic duct. 4) Fever.   I reviewed the ERCP and discussed the case with Dr. Deatra Ina.  He had a significant amount of clots in the CBD. No significant change with the liver panel, but he is s/p ERCP.  One concern is that he may have more clot development as  he was on anticoagulation.  WBC has improved, but still febrile.  Plan: 1)  Follow liver panel.  If the numbers increase again he may require a repeat ERCP this weekend presuming he has a reaccumulation of clots. 2) Advance to a regular diet. 3) Continue with antibiotics.  LOS: 3 days   Brianne Maina D 02/19/2014, 6:57 AM

## 2014-02-20 LAB — COMPREHENSIVE METABOLIC PANEL
ALT: 80 U/L — ABNORMAL HIGH (ref 0–53)
AST: 75 U/L — AB (ref 0–37)
Albumin: 1.7 g/dL — ABNORMAL LOW (ref 3.5–5.2)
Alkaline Phosphatase: 990 U/L — ABNORMAL HIGH (ref 39–117)
Anion gap: 14 (ref 5–15)
BUN: 11 mg/dL (ref 6–23)
CALCIUM: 8.3 mg/dL — AB (ref 8.4–10.5)
CO2: 22 mEq/L (ref 19–32)
Chloride: 99 mEq/L (ref 96–112)
Creatinine, Ser: 0.82 mg/dL (ref 0.50–1.35)
GLUCOSE: 105 mg/dL — AB (ref 70–99)
Potassium: 3.7 mEq/L (ref 3.7–5.3)
SODIUM: 135 meq/L — AB (ref 137–147)
TOTAL PROTEIN: 7.3 g/dL (ref 6.0–8.3)
Total Bilirubin: 4 mg/dL — ABNORMAL HIGH (ref 0.3–1.2)

## 2014-02-20 LAB — CBC
HCT: 26.9 % — ABNORMAL LOW (ref 39.0–52.0)
Hemoglobin: 9 g/dL — ABNORMAL LOW (ref 13.0–17.0)
MCH: 29.8 pg (ref 26.0–34.0)
MCHC: 33.5 g/dL (ref 30.0–36.0)
MCV: 89.1 fL (ref 78.0–100.0)
PLATELETS: 201 10*3/uL (ref 150–400)
RBC: 3.02 MIL/uL — ABNORMAL LOW (ref 4.22–5.81)
RDW: 15 % (ref 11.5–15.5)
WBC: 12.2 10*3/uL — ABNORMAL HIGH (ref 4.0–10.5)

## 2014-02-20 MED ORDER — HYDROMORPHONE HCL 1 MG/ML IJ SOLN
1.0000 mg | INTRAMUSCULAR | Status: DC | PRN
  Administered 2014-02-20 – 2014-02-22 (×11): 1 mg via INTRAVENOUS
  Filled 2014-02-20: qty 1
  Filled 2014-02-20: qty 2
  Filled 2014-02-20: qty 1
  Filled 2014-02-20: qty 2
  Filled 2014-02-20 (×6): qty 1
  Filled 2014-02-20: qty 2

## 2014-02-20 NOTE — Progress Notes (Signed)
Progress Note for [facility]  Subjective: Feeling better.  Objective: Vital signs in last 24 hours: Temp:  [97.7 F (36.5 C)-102 F (38.9 C)] 97.7 F (36.5 C) (11/29 0536) Pulse Rate:  [79-88] 79 (11/29 0536) Resp:  [16-18] 18 (11/29 0536) BP: (107-116)/(74-79) 107/74 mmHg (11/29 0536) SpO2:  [94 %-96 %] 96 % (11/29 0536) Last BM Date: 02/19/14  Intake/Output from previous day: 11/28 0701 - 11/29 0700 In: 530 [P.O.:480; IV Piggyback:50] Out: -  Intake/Output this shift: Total I/O In: 50 [IV Piggyback:50] Out: -   General appearance: alert and no distress GI: soft, non-tender; bowel sounds normal; no masses,  no organomegaly  Lab Results:  Recent Labs  02/18/14 0418 02/19/14 0529 02/20/14 0540  WBC 16.3* 11.4* 12.2*  HGB 8.6* 8.7* 9.0*  HCT 25.4* 25.5* 26.9*  PLT 170 164 201   BMET  Recent Labs  02/18/14 0418 02/19/14 0529 02/20/14 0540  NA 138 136* 135*  K 3.3* 3.7 3.7  CL 102 102 99  CO2 23 24 22   GLUCOSE 104* 104* 105*  BUN 12 12 11   CREATININE 0.77 0.76 0.82  CALCIUM 8.2* 8.1* 8.3*   LFT  Recent Labs  02/20/14 0540  PROT 7.3  ALBUMIN 1.7*  AST 75*  ALT 80*  ALKPHOS 990*  BILITOT 4.0*   PT/INR No results for input(s): LABPROT, INR in the last 72 hours. Hepatitis Panel No results for input(s): HEPBSAG, HCVAB, HEPAIGM, HEPBIGM in the last 72 hours. C-Diff No results for input(s): CDIFFTOX in the last 72 hours. Fecal Lactopherrin No results for input(s): FECLLACTOFRN in the last 72 hours.  Studies/Results: Dg Ercp  02/18/2014   CLINICAL DATA:  Multifocal hepatic abscesses, pancreatitis history  EXAM: ERCP  TECHNIQUE: Multiple spot images obtained with the fluoroscopic device and submitted for interpretation post-procedure.  COMPARISON:  02/16/2014  FINDINGS: Initial images demonstrate a small pancreatic duct stent. Injection in the common bile duct demonstrates narrowing in the mid to distal common bile duct causing more proximal  obstructive change similar to that seen on recent CT. A plastic stent was then placed in the common bile duct across the narrowing. The pancreatic ductal stent appears stable.  FLUOROSCOPY TIME:  7 min 30 seconds  IMPRESSION: Narrowing within the common bile duct with subsequent stent placement. The narrowing appears to be related to extrinsic compression from the lesions seen recently in the liver on CT  These images were submitted for radiologic interpretation only. Please see the procedural report for the amount of contrast and the fluoroscopy time utilized.   Electronically Signed   By: Inez Catalina M.D.   On: 02/18/2014 13:15    Medications:  Scheduled: . docusate sodium  100 mg Oral BID  . pantoprazole (PROTONIX) IV  40 mg Intravenous Q12H  . piperacillin-tazobactam (ZOSYN)  IV  3.375 g Intravenous Q8H  . sodium chloride  3 mL Intravenous Q12H   Continuous: . lactated ringers      Assessment/Plan: 1) Hepatic abscesses. 2) Probable biliary stricture from chronic pancreatitis. 3) Obstructive liver pattern.   Clinically the patient appears quite well despite the increase in his liver enzymes and the persistent fevers.  I am concerned that he may have reaccumulation of clots in the biliary tract from the effects of anticoagulation.  His bilirubin and AP are increasing again.  I will discuss the case with [facility] in the AM about a repeat ERCP.  Plan: 1) ? ERCP tomorrow. 2) Continue with antibiotics.    LOS: 4  days   Lekisha Mcghee D 02/20/2014, 6:59 AM

## 2014-02-20 NOTE — Progress Notes (Signed)
Patient ID: Lawrence Brock, male   DOB: [birth year], 43 y.o.   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 At that time he had a pancreatitic stent in place that was placed in [place] and had been in place for several years. It was removed with snare during EGD on 6/23 by Lawrence Brock at which time he was also found to have single non-bleeding ulcer in the duodenal bulb. Biopsies were negative for Hpylori. He was supposed to follow-up for repeat imaging in 6-8 weeks but failed to do so. Presented 11/24 with complaints of intermittent upper abdominal pains and fevers up to 101. Is drinking a lot of fluids but not eating much. Denies any ETOH since May, prior to his hospitalization. He has been taking tylenol for fever and no more than 1 Oxycodone per day for severe pain. 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 LFT's were elevated at AST 65, ALT 90, ALP 982, and total bili 3.7,. Increased from 11/17.Patient sent from Dr Lawrence Brock office for further eval. Patient states he has not had alcohol since his discharge in June, denies any weight loss  Assessment and Plan:    Active Problems:  ? Hepatic abscess - ? portal hepatis mass obstructing the area - management per GI team, appreciate assistance - continue Zosyn day #5 and repeat CBC in AM  J-shaped calcification in his pancreas/? Clinical cholangitis  - ? retained stent or dystrophic calcification of the duct - ERCP notable for narrowing within CBD with subsequent stent placement,  narrowing related to extrinsic compression from the lesions seen recently in the liver on CT  - management per GI team, may need repeat ERCP in AM  Hypokalemia - supplemented, repeat BMP in AM  Transaminitis - secondary to the above, LFT's trending up - monitor   Leukocytosis with fever, SIRS - secondary to principal problem  - WBC continue trending up - continue Zosyn as noted above   Acute on chronic blood loss anemia  - possible related to the above as well - no signs of active bleeding, Hg trend: 10.3 --> 8.6 --> 8.7 --> 9.0 - repeat CBC in AM  DVT prophylaxis  Changed Lovenox to SCD's 11/27  Code Status: Full Family Communication: Pt at bedside Disposition Plan: Home when medically stable   IV Access:    Peripheral IV Procedures and diagnostic studies:    Ct Abdomen Pelvis W Contrast 02/16/2014 1. New multifocal mass-like areas of heterogeneous enhancement within the liver. In the absence of a known malignancy, and with the history of fever and elevated white blood cell count, ? multifocal infection with extensive microabscess. However, other differential considerations include intrahepatic pseudocysts, and metastatic disease to the liver. Correlation with biopsy is recommended if clinically appropriate.  2. Large amount of abnormal soft tissue in the porta hepatis which encases numerous vascular structures and extends downward toward the head of the pancreas. It is uncertain whether or not this is an infiltrative malignancy such as a cholangiocarcinoma, cephalad migration of a primary pancreatic malignancy, malignant lymphadenopathy, or simply a combination  of extensive scar tissue and infiltrative atypical appearing pseudocyst related to chronic pancreatitis with numerous dilated vessels from cavernous transformation in the porta hepatis. This does appear to be causing some biliary tract obstruction as evidenced by the mild intrahepatic biliary ductal  dilatation discussed above. Correlation with endoscopic ultrasound and carefully placed biopsy of this tissue (to avoid the very large varices in this region) is suggested if clinically appropriate.  3. 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  4. Persistent linear high-density structure extending from the pancreatic head into the second portion of the duodenum. Given the history of extraction of a stent from the minor duct, this is presumably either some chronic dystrophic calcification in the ductal wall around where the prior stent was placed, or could represent a small fragment of the previously retrieved stent. Attention to this region at time of follow-up endoscopy is suggested.  5. Sequela of chronic pancreatitis redemonstrated, with resolution of the previously noted multi-cystic mass like area in the head of the pancreas compared to the prior examination, which was presumably a pancreatic pseudocyst. 6. Additional incidental findings, as above.  Dg Ercp 02/18/2014 Narrowing within the common bile duct with subsequent stent placement. The narrowing appears to be related to extrinsic compression from the lesions seen recently in the liver on CT  Medical Consultants:    GI Other Consultants:    None  Anti-Infectives:    Zosyn 11/25 -->  Lawrence Brock  [facility] Pager [phone]  If 7PM-7AM, please contact night-coverage [email or web address] Password [email or web address] 02/20/2014, 2:19 PM   LOS: 4 days   HPI/Subjective: No events overnight.   Objective: Filed Vitals:   02/19/14 2120 02/20/14 0235 02/20/14 0536 02/20/14 1414  BP: 116/79  107/74 118/79  Pulse: 87  79 74  Temp: 99.8 F (37.7 C) 102 F (38.9 C) 97.7 F (36.5 C) 98.6 F (37 C)  TempSrc: Oral  Oral Oral  Resp: 18  18 18   Height:      Weight:      SpO2: 95%  96% 98%    Intake/Output Summary (Last 24 hours) at 02/20/14 1419 Last data filed at 02/20/14 0811  Gross per 24 hour  Intake     250 ml  Output      0 ml  Net    250 ml    Exam:   General:  Pt is alert, follows commands appropriately, not in acute distress  Cardiovascular: Regular rate and rhythm, S1/S2, no murmurs, no rubs, no gallops  Respiratory: Clear to auscultation bilaterally, no wheezing, no crackles, no rhonchi  Abdomen: Soft, tender in upper abd quadrants, non distended, bowel sounds present, no guarding  Extremities: No edema, pulses DP and PT palpable bilaterally  Neuro: Grossly nonfocal  Data Reviewed: Basic Metabolic Panel:  Recent Labs Lab 02/15/14 1522 02/16/14 1430 02/17/14 0505 02/18/14 0418 02/19/14 0529 02/20/14 0540  NA 131*  --  138 138 136* 135*  K 3.5  --  3.9 3.3* 3.7 3.7  CL 98  --  102 102 102 99  CO2 22  --  22 23 24 22   GLUCOSE 148*  --  106* 104* 104* 105*  BUN 13  --  11 12 12 11   CREATININE 0.8 0.84 0.71 0.77 0.76 0.82  CALCIUM 8.0*  --  8.8 8.2* 8.1* 8.3*  MG  --  2.2  --   --   --   --    Liver Function Tests:  Recent Labs Lab 02/15/14 1522 02/17/14  0505 02/18/14 0418 02/19/14 0529 02/20/14 0540  AST 65* 58* 35 65* 75*  ALT 90* 93* 61* 70* 80*  ALKPHOS 982* 1095* 798* 876* 990*  BILITOT 3.7* 2.8* 3.0* 3.2* 4.0*  PROT 7.8 7.7 6.6 6.3 7.3  ALBUMIN 2.1* 1.7* 1.6* 1.5* 1.7*    Recent Labs Lab 02/15/14 1522  LIPASE 20.0   No results for input(s): AMMONIA in the last 168 hours. CBC:  Recent Labs Lab 02/15/14 1522  02/16/14 1820 02/17/14 0505 02/18/14 0418 02/19/14 0529 02/20/14 0540  WBC 18.6*  < > 14.8* 13.6* 16.3* 11.4* 12.2*  NEUTROABS 16.7*  --   --   --   --   --   --   HGB 10.1*  < > 10.7* 10.3* 8.6* 8.7* 9.0*  HCT 30.2*  < > 32.5* 31.6* 25.4* 25.5* 26.9*  MCV 89.3  < > 90.0 91.6 90.7 90.7 89.1  PLT 276.0  < > 214 205 170 164 201  < > = values in this interval not displayed.  Recent Results (from the past 240 hour(s))  Culture, blood (routine x 2)     Status: None (Preliminary result)   Collection Time: 02/16/14  3:40 PM   Result Value Ref Range Status   Specimen Description BLOOD LEFT ARM  Final   Special Requests BOTTLES DRAWN AEROBIC ONLY [place]  Final   Culture  Setup Time   Final    02/16/2014 21:50 Performed at [facility]    Culture   Final           BLOOD CULTURE RECEIVED NO GROWTH TO DATE CULTURE WILL BE HELD FOR 5 DAYS BEFORE ISSUING A FINAL NEGATIVE REPORT Performed at [facility]    Report Status PENDING  Incomplete  Culture, blood (routine x 2)     Status: None (Preliminary result)   Collection Time: 02/16/14  3:46 PM  Result Value Ref Range Status   Specimen Description BLOOD RIGHT ARM  Final   Special Requests BOTTLES DRAWN AEROBIC AND ANAEROBIC 10CC  Final   Culture  Setup Time   Final    02/16/2014 21:42 Performed at [facility]    Culture   Final           BLOOD CULTURE RECEIVED NO GROWTH TO DATE CULTURE WILL BE HELD FOR 5 DAYS BEFORE ISSUING A FINAL NEGATIVE REPORT Performed at [facility]    Report Status PENDING  Incomplete     Scheduled Meds: . docusate sodium  100 mg Oral BID  . pantoprazole (PROTONIX) IV  40 mg Intravenous Q12H  . piperacillin-tazobactam (ZOSYN)  IV  3.375 g Intravenous Q8H  . sodium chloride  3 mL Intravenous Q12H   Continuous Infusions: . lactated ringers

## 2014-02-20 NOTE — Plan of Care (Signed)
Problem: Phase II Progression Outcomes Goal: Progress activity as tolerated unless otherwise ordered Outcome: Completed/Met Date Met:  02/20/14 Goal: Vital signs remain stable Outcome: Completed/Met Date Met:  02/20/14

## 2014-02-21 ENCOUNTER — Telehealth: Payer: Self-pay | Admitting: Gastroenterology

## 2014-02-21 DIAGNOSIS — K831 Obstruction of bile duct: Secondary | ICD-10-CM

## 2014-02-21 LAB — CBC
HEMATOCRIT: 26.9 % — AB (ref 39.0–52.0)
HEMOGLOBIN: 8.9 g/dL — AB (ref 13.0–17.0)
MCH: 29.3 pg (ref 26.0–34.0)
MCHC: 33.1 g/dL (ref 30.0–36.0)
MCV: 88.5 fL (ref 78.0–100.0)
Platelets: 187 10*3/uL (ref 150–400)
RBC: 3.04 MIL/uL — ABNORMAL LOW (ref 4.22–5.81)
RDW: 14.9 % (ref 11.5–15.5)
WBC: 10.3 10*3/uL (ref 4.0–10.5)

## 2014-02-21 LAB — COMPREHENSIVE METABOLIC PANEL
ALBUMIN: 1.7 g/dL — AB (ref 3.5–5.2)
ALT: 95 U/L — AB (ref 0–53)
AST: 109 U/L — ABNORMAL HIGH (ref 0–37)
Alkaline Phosphatase: 1003 U/L — ABNORMAL HIGH (ref 39–117)
Anion gap: 12 (ref 5–15)
BUN: 9 mg/dL (ref 6–23)
CO2: 23 mEq/L (ref 19–32)
Calcium: 8.3 mg/dL — ABNORMAL LOW (ref 8.4–10.5)
Chloride: 98 mEq/L (ref 96–112)
Creatinine, Ser: 0.73 mg/dL (ref 0.50–1.35)
GFR calc Af Amer: >90 mL/min (ref >90)
GFR calc non Af Amer: >90 mL/min (ref >90)
Glucose, Bld: 131 mg/dL — ABNORMAL HIGH (ref 70–99)
POTASSIUM: 3.9 meq/L (ref 3.7–5.3)
Sodium: 133 mEq/L — ABNORMAL LOW (ref 137–147)
TOTAL PROTEIN: 7.4 g/dL (ref 6.0–8.3)
Total Bilirubin: 4.1 mg/dL — ABNORMAL HIGH (ref 0.3–1.2)

## 2014-02-21 MED ORDER — LIP MEDEX EX OINT
TOPICAL_OINTMENT | CUTANEOUS | Status: AC
  Administered 2014-02-21: 07:00:00
  Filled 2014-02-21: qty 7

## 2014-02-21 NOTE — Plan of Care (Signed)
Problem: Phase I Progression Outcomes Goal: Pain controlled with appropriate interventions Outcome: Completed/Met Date Met:  02/21/14 Goal: OOB as tolerated unless otherwise ordered Outcome: Completed/Met Date Met:  02/21/14 Goal: Initial discharge plan identified Outcome: Completed/Met Date Met:  02/21/14 Goal: Voiding-avoid urinary catheter unless indicated Outcome: Completed/Met Date Met:  02/21/14 Goal: Hemodynamically stable Outcome: Completed/Met Date Met:  02/21/14  Problem: Phase II Progression Outcomes Goal: IV changed to normal saline lock Outcome: Completed/Met Date Met:  02/21/14

## 2014-02-21 NOTE — Telephone Encounter (Signed)
Spoke with patient's wife and told her to talk with the nurse at the hospital and MD there to get the letter for him. Explained that they will have a better idea as to how long he may be out of work and if he needs to have FMLA filled out.

## 2014-02-21 NOTE — Progress Notes (Signed)
     [facility] Progress Note  Subjective:  Feels ok.  Some abdominal bloating and upper abdominal discomfort.  Walking around halls.  Would like to resume regular diet today since no procedure until tomorrow.  Objective:  Vital signs in last 24 hours: Temp:  [98 F (36.7 C)-99.8 F (37.7 C)] 99.8 F (37.7 C) (11/30 0500) Pulse Rate:  [73-74] 73 (11/29 2222) Resp:  [16-20] 16 (11/30 0500) BP: (107-124)/(72-82) 124/82 mmHg (11/30 0500) SpO2:  [93 %-98 %] 93 % (11/30 0500) Last BM Date: 02/20/14 General:  Alert, Well-developed, in NAD; jaundice noted Heart:  Regular rate and rhythm; no murmurs Pulm:  CTAB.  No W/R/R. Abdomen:  Soft, slightly distended.  Normal bowel sounds.  Mild upper abdominal TTP. Extremities:  Without edema. Neurologic:  Alert and  oriented x4;  grossly normal neurologically. Psych:  Alert and cooperative. Normal mood and affect.  Intake/Output from previous day: 11/29 0701 - 11/30 0700 In: 780 [P.O.:680; IV Piggyback:100] Out: -    Lab Results:  Recent Labs  02/19/14 0529 02/20/14 0540 02/21/14 0545  WBC 11.4* 12.2* 10.3  HGB 8.7* 9.0* 8.9*  HCT 25.5* 26.9* 26.9*  PLT 164 201 187   BMET  Recent Labs  02/19/14 0529 02/20/14 0540 02/21/14 0545  NA 136* 135* 133*  K 3.7 3.7 3.9  CL 102 99 98  CO2 24 22 23   GLUCOSE 104* 105* 131*  BUN 12 11 9   CREATININE 0.76 0.82 0.73  CALCIUM 8.1* 8.3* 8.3*   LFT  Recent Labs  02/21/14 0545  PROT 7.4  ALBUMIN 1.7*  AST 109*  ALT 95*  ALKPHOS 1003*  BILITOT 4.1*    Assessment / Plan: 1) Hepatic abscesses:  Likely secondary to cholangitis/biliary obstruction. 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  Total bili and ALP continuing to increase, concerning for re-accumulation of clots/clogged stent. 4) ? Retained pancreatic stent in dorsal  pancreas vs calcification  *ERCP tomorrow, 12/1. *Continue with antibiotics (Zosyn). *Monitor LFT's. *Ok for regular diet today then NPO after midnight.   LOS: 5 days   ZEHR, JESSICA D.  02/21/2014, 8:11 AM  Pager number [phone]     Attending physician's note   I have taken an interval history, reviewed the chart and examined the patient. I agree with the Advanced Practitioner's note, impression and recommendations. ERCP with stent replacement tomorrow morning, suspect biliary stent is clogged. Recommend primary service consult ID for recommendations on management and optimal antibiotic regimen now and as outpatient.   Pricilla Riffle. Fuller Plan, MD [facility]

## 2014-02-21 NOTE — Progress Notes (Signed)
Patient ID: Lawrence Brock, male   DOB: [birth year], 43 y.o.   MRN: [id number]  [facility] PROGRESS NOTE  Lawrence Brock QMV:[id number] DOB: [birth year] DOA: 02/16/2014 PCP: [name], MD  Brief narrative: 43 year old male who is known remotely to Dr. 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 At that time he had a pancreatitic stent in place that was placed in [place] and had been in place for several years. It was removed with snare during EGD on 6/23 by Dr. Hilarie Fredrickson at which time he was also found to have single non-bleeding ulcer in the duodenal bulb. Biopsies were negative for Hpylori. He was supposed to follow-up for repeat imaging in 6-8 weeks but failed to do so. Presented 11/24 with complaints of intermittent upper abdominal pains and fevers up to 101. Is drinking a lot of fluids but not eating much. Denies any ETOH since May, prior to his hospitalization. He has been taking tylenol for fever and no more than 1 Oxycodone per day for severe pain. 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 LFT's were elevated at AST 65, ALT 90, ALP 982, and total bili 3.7,. Increased from 11/17.Patient sent from Dr [name] office for further eval. Patient states he has not had alcohol since his discharge in June, denies any weight loss  Assessment and Plan:    Active Problems:  ? Hepatic abscess - ? portal hepatis mass obstructing the area - management per GI team, appreciate assistance - continue Zosyn day #6 and repeat CBC in AM  J-shaped calcification in his pancreas/? Clinical cholangitis  - ? retained stent or dystrophic calcification of the duct - ERCP notable for narrowing within CBD with subsequent stent placement,  narrowing related to extrinsic compression from the lesions seen recently in the liver on CT  - management per GI team, may need repeat ERCP in AM 02/22/2014  Hypokalemia - supplemented, repeat BMP in AM  Transaminitis - secondary to the above, LFT's trending up - monitor   Leukocytosis with fever, SIRS - secondary to principal problem  - WBC continue trending down and WNL this AM - continue Zosyn as noted above   Acute on chronic blood loss anemia  - possible related to the above as well - no signs of active bleeding, Hg remains stable ~ 9 - repeat CBC in AM  DVT prophylaxis  Changed Lovenox to SCD's 11/27  Code Status: Full Family Communication: Pt at bedside Disposition Plan: Home when medically stable   IV Access:    Peripheral IV Procedures and diagnostic studies:    Ct Abdomen Pelvis W Contrast 02/16/2014 1. New multifocal mass-like areas of heterogeneous enhancement within the liver. In the absence of a known malignancy, and with the history of fever and elevated white blood cell count, ? multifocal infection with extensive microabscess. However, other differential considerations include intrahepatic pseudocysts, and metastatic disease to the liver. Correlation with biopsy is recommended if clinically appropriate.  2. Large amount of abnormal soft tissue in the porta hepatis which encases numerous vascular structures and extends downward toward the head of the pancreas. It is uncertain whether or not this is an infiltrative malignancy such as a cholangiocarcinoma, cephalad migration of a primary pancreatic malignancy, malignant lymphadenopathy, or simply a combination  of extensive scar tissue and infiltrative atypical appearing pseudocyst related to chronic pancreatitis with numerous dilated vessels from cavernous transformation in the porta hepatis. This does appear to be causing some biliary tract obstruction as evidenced by the mild intrahepatic  biliary ductal dilatation discussed above. Correlation with endoscopic ultrasound and carefully placed biopsy of this tissue (to avoid the very large varices in this region) is suggested if clinically appropriate.  3. 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  4. Persistent linear high-density structure extending from the pancreatic head into the second portion of the duodenum. Given the history of extraction of a stent from the minor duct, this is presumably either some chronic dystrophic calcification in the ductal wall around where the prior stent was placed, or could represent a small fragment of the previously retrieved stent. Attention to this region at time of follow-up endoscopy is suggested.  5. Sequela of chronic pancreatitis redemonstrated, with resolution of the previously noted multi-cystic mass like area in the head of the pancreas compared to the prior examination, which was presumably a pancreatic pseudocyst. 6. Additional incidental findings, as above.  Dg Ercp 02/18/2014 Narrowing within the common bile duct with subsequent stent placement. The narrowing appears to be related to extrinsic compression from the lesions seen recently in the liver on CT  Medical Consultants:    GI Other Consultants:    None  Anti-Infectives:    Zosyn 11/25 -->  Faye Ramsay, MD  [facility] Pager [phone]  If 7PM-7AM, please contact night-coverage [email or web address] Password [email or web address] 02/21/2014, 11:38 AM   LOS: 5 days   HPI/Subjective: No events overnight.   Objective: Filed Vitals:   02/20/14 0536 02/20/14 1414 02/20/14 2222 02/21/14 0500  BP: 107/74 118/79 107/72 124/82  Pulse: 79 74 73   Temp: 97.7 F (36.5 C) 98.6 F (37 C) 98 F (36.7 C) 99.8 F (37.7 C)  TempSrc: Oral Oral Oral Oral  Resp: 18 18 20 16   Height:      Weight:      SpO2: 96% 98% 96% 93%    Intake/Output Summary (Last 24 hours) at 02/21/14 1138 Last data filed at 02/20/14  2056  Gross per 24 hour  Intake    580 ml  Output      0 ml  Net    580 ml    Exam:   General:  Pt is alert, follows commands appropriately, not in acute distress  Cardiovascular: Regular rate and rhythm, S1/S2, no murmurs, no rubs, no gallops  Respiratory: Clear to auscultation bilaterally, no wheezing, no crackles, no rhonchi  Abdomen: Soft, tender in epigastric area, non distended, bowel sounds present, no guarding  Extremities: No edema, pulses DP and PT palpable bilaterally  Neuro: Grossly nonfocal  Data Reviewed: Basic Metabolic Panel:  Recent Labs Lab 02/16/14 1430 02/17/14 0505 02/18/14 0418 02/19/14 0529 02/20/14 0540 02/21/14 0545  NA  --  138 138 136* 135* 133*  K  --  3.9 3.3* 3.7 3.7 3.9  CL  --  102 102 102 99 98  CO2  --  22 23 24 22 23   GLUCOSE  --  106* 104* 104* 105* 131*  BUN  --  11 12 12 11 9   CREATININE 0.84 0.71 0.77 0.76 0.82 0.73  CALCIUM  --  8.8 8.2* 8.1* 8.3* 8.3*  MG 2.2  --   --   --   --   --    Liver Function Tests:  Recent Labs Lab 02/17/14 0505 02/18/14 0418  02/19/14 0529 02/20/14 0540 02/21/14 0545  AST 58* 35 65* 75* 109*  ALT 93* 61* 70* 80* 95*  ALKPHOS 1095* 798* 876* 990* 1003*  BILITOT 2.8* 3.0* 3.2* 4.0* 4.1*  PROT 7.7 6.6 6.3 7.3 7.4  ALBUMIN 1.7* 1.6* 1.5* 1.7* 1.7*    Recent Labs Lab 02/15/14 1522  LIPASE 20.0   CBC:  Recent Labs Lab 02/15/14 1522  02/17/14 0505 02/18/14 0418 02/19/14 0529 02/20/14 0540 02/21/14 0545  WBC 18.6*  < > 13.6* 16.3* 11.4* 12.2* 10.3  NEUTROABS 16.7*  --   --   --   --   --   --   HGB 10.1*  < > 10.3* 8.6* 8.7* 9.0* 8.9*  HCT 30.2*  < > 31.6* 25.4* 25.5* 26.9* 26.9*  MCV 89.3  < > 91.6 90.7 90.7 89.1 88.5  PLT 276.0  < > 205 170 164 201 187  < > = values in this interval not displayed.   Recent Results (from the past 240 hour(s))  Culture, blood (routine x 2)     Status: None (Preliminary result)   Collection Time: 02/16/14  3:40 PM  Result Value Ref Range  Status   Specimen Description BLOOD LEFT ARM  Final   Special Requests BOTTLES DRAWN AEROBIC ONLY [place]  Final   Culture  Setup Time   Final    02/16/2014 21:50 Performed at [facility]    Culture   Final           BLOOD CULTURE RECEIVED NO GROWTH TO DATE CULTURE WILL BE HELD FOR 5 DAYS BEFORE ISSUING A FINAL NEGATIVE REPORT Performed at [facility]    Report Status PENDING  Incomplete  Culture, blood (routine x 2)     Status: None (Preliminary result)   Collection Time: 02/16/14  3:46 PM  Result Value Ref Range Status   Specimen Description BLOOD RIGHT ARM  Final   Special Requests BOTTLES DRAWN AEROBIC AND ANAEROBIC 10CC  Final   Culture  Setup Time   Final    02/16/2014 21:42 Performed at [facility]    Culture   Final           BLOOD CULTURE RECEIVED NO GROWTH TO DATE CULTURE WILL BE HELD FOR 5 DAYS BEFORE ISSUING A FINAL NEGATIVE REPORT Performed at [facility]    Report Status PENDING  Incomplete     Scheduled Meds: . docusate sodium  100 mg Oral BID  . pantoprazole (PROTONIX) IV  40 mg Intravenous Q12H  . piperacillin-tazobactam (ZOSYN)  IV  3.375 g Intravenous Q8H  . sodium chloride  3 mL Intravenous Q12H   Continuous Infusions: . lactated ringers

## 2014-02-21 NOTE — Anesthesia Preprocedure Evaluation (Addendum)
Anesthesia Evaluation  Patient identified by MRN, date of birth, ID band Patient awake    Reviewed: Allergy & Precautions, H&P , NPO status , Patient's Chart, lab work & pertinent test results  History of Anesthesia Complications Negative for: history of anesthetic complications  Airway Mallampati: II  TM Distance: >3 FB Neck ROM: Full    Dental no notable dental hx. (+) Dental Advisory Given   Pulmonary Current Smoker,  breath sounds clear to auscultation  Pulmonary exam normal       Cardiovascular negative cardio ROS  Rhythm:Regular Rate:Normal     Neuro/Psych negative neurological ROS  negative psych ROS   GI/Hepatic Neg liver ROS, PUD, pancreatitis   Endo/Other  negative endocrine ROS  Renal/GU negative Renal ROS  negative genitourinary   Musculoskeletal negative musculoskeletal ROS (+)   Abdominal   Peds negative pediatric ROS (+)  Hematology negative hematology ROS (+)   Anesthesia Other Findings   Reproductive/Obstetrics negative OB ROS                           Anesthesia Physical Anesthesia Plan  ASA: III  Anesthesia Plan: General   Post-op Pain Management:    Induction: Intravenous  Airway Management Planned: Oral ETT  Additional Equipment:   Intra-op Plan:   Post-operative Plan: Extubation in OR  Informed Consent: I have reviewed the patients History and Physical, chart, labs and discussed the procedure including the risks, benefits and alternatives for the proposed anesthesia with the patient or authorized representative who has indicated his/her understanding and acceptance.   Dental advisory given  Plan Discussed with: CRNA  Anesthesia Plan Comments:         Anesthesia Quick Evaluation

## 2014-02-22 ENCOUNTER — Encounter (HOSPITAL_COMMUNITY)
Admission: AD | Disposition: A | Discharge status: Home / Self Care | Payer: Self-pay | Source: Ambulatory Visit | Attending: Internal Medicine

## 2014-02-22 ENCOUNTER — Encounter (HOSPITAL_COMMUNITY): Payer: Self-pay | Admitting: Gastroenterology

## 2014-02-22 ENCOUNTER — Inpatient Hospital Stay (HOSPITAL_COMMUNITY): Payer: BC Managed Care – PPO | Admitting: Anesthesiology

## 2014-02-22 ENCOUNTER — Inpatient Hospital Stay (HOSPITAL_COMMUNITY): Payer: BC Managed Care – PPO

## 2014-02-22 DIAGNOSIS — K831 Obstruction of bile duct: Secondary | ICD-10-CM

## 2014-02-22 HISTORY — PX: ERCP: SHX5425

## 2014-02-22 LAB — CBC
HCT: 24.5 % — ABNORMAL LOW (ref 39.0–52.0)
HEMOGLOBIN: 8.1 g/dL — AB (ref 13.0–17.0)
MCH: 29.7 pg (ref 26.0–34.0)
MCHC: 33.1 g/dL (ref 30.0–36.0)
MCV: 89.7 fL (ref 78.0–100.0)
Platelets: 144 10*3/uL — ABNORMAL LOW (ref 150–400)
RBC: 2.73 MIL/uL — ABNORMAL LOW (ref 4.22–5.81)
RDW: 14.9 % (ref 11.5–15.5)
WBC: 6.3 10*3/uL (ref 4.0–10.5)

## 2014-02-22 LAB — CULTURE, BLOOD (ROUTINE X 2)
CULTURE: NO GROWTH
Culture: NO GROWTH

## 2014-02-22 LAB — BASIC METABOLIC PANEL
Anion gap: 11 (ref 5–15)
BUN: 6 mg/dL (ref 6–23)
CHLORIDE: 102 meq/L (ref 96–112)
CO2: 25 mEq/L (ref 19–32)
Calcium: 8.2 mg/dL — ABNORMAL LOW (ref 8.4–10.5)
Creatinine, Ser: 0.67 mg/dL (ref 0.50–1.35)
GFR calc Af Amer: >90 mL/min (ref >90)
GFR calc non Af Amer: >90 mL/min (ref >90)
GLUCOSE: 124 mg/dL — AB (ref 70–99)
POTASSIUM: 3.8 meq/L (ref 3.7–5.3)
SODIUM: 138 meq/L (ref 137–147)

## 2014-02-22 SURGERY — ERCP, WITH INTERVENTION IF INDICATED
Anesthesia: General

## 2014-02-22 MED ORDER — ONDANSETRON HCL 4 MG/2ML IJ SOLN
INTRAMUSCULAR | Status: AC
  Filled 2014-02-22: qty 2

## 2014-02-22 MED ORDER — SODIUM CHLORIDE 0.9 % IV SOLN
INTRAVENOUS | Status: DC | PRN
  Administered 2014-02-22: 20 mL

## 2014-02-22 MED ORDER — SODIUM CHLORIDE 0.9 % IV SOLN
INTRAVENOUS | Status: DC
  Administered 2014-02-23: 02:00:00 via INTRAVENOUS

## 2014-02-22 MED ORDER — LIDOCAINE HCL (CARDIAC) 20 MG/ML IV SOLN
INTRAVENOUS | Status: AC
  Filled 2014-02-22: qty 5

## 2014-02-22 MED ORDER — GLUCAGON HCL RDNA (DIAGNOSTIC) 1 MG IJ SOLR
INTRAMUSCULAR | Status: AC
  Filled 2014-02-22: qty 2

## 2014-02-22 MED ORDER — SUCCINYLCHOLINE CHLORIDE 20 MG/ML IJ SOLN
INTRAMUSCULAR | Status: DC | PRN
  Administered 2014-02-22: 100 mg via INTRAVENOUS

## 2014-02-22 MED ORDER — EPHEDRINE SULFATE 50 MG/ML IJ SOLN
INTRAMUSCULAR | Status: DC | PRN
  Administered 2014-02-22 (×2): 5 mg via INTRAVENOUS

## 2014-02-22 MED ORDER — LIDOCAINE HCL (CARDIAC) 20 MG/ML IV SOLN
INTRAVENOUS | Status: DC | PRN
  Administered 2014-02-22: 50 mg via INTRAVENOUS

## 2014-02-22 MED ORDER — ONDANSETRON HCL 4 MG/2ML IJ SOLN
INTRAMUSCULAR | Status: DC | PRN
  Administered 2014-02-22: 4 mg via INTRAVENOUS

## 2014-02-22 MED ORDER — MIDAZOLAM HCL 2 MG/2ML IJ SOLN
INTRAMUSCULAR | Status: AC
  Filled 2014-02-22: qty 2

## 2014-02-22 MED ORDER — FENTANYL CITRATE 0.05 MG/ML IJ SOLN
INTRAMUSCULAR | Status: DC | PRN
  Administered 2014-02-22: 50 ug via INTRAVENOUS
  Administered 2014-02-22: 100 ug via INTRAVENOUS

## 2014-02-22 MED ORDER — GLUCAGON HCL RDNA (DIAGNOSTIC) 1 MG IJ SOLR
INTRAMUSCULAR | Status: DC | PRN
  Administered 2014-02-22 (×2): 0.25 mg via INTRAVENOUS

## 2014-02-22 MED ORDER — FENTANYL CITRATE 0.05 MG/ML IJ SOLN
INTRAMUSCULAR | Status: AC
  Filled 2014-02-22: qty 5

## 2014-02-22 MED ORDER — LIP MEDEX EX OINT
TOPICAL_OINTMENT | CUTANEOUS | Status: AC
  Administered 2014-02-22: 08:00:00
  Filled 2014-02-22: qty 7

## 2014-02-22 MED ORDER — PROPOFOL 10 MG/ML IV BOLUS
INTRAVENOUS | Status: DC | PRN
  Administered 2014-02-22: 120 mg via INTRAVENOUS

## 2014-02-22 MED ORDER — LACTATED RINGERS IV SOLN
INTRAVENOUS | Status: DC
  Administered 2014-02-22: 1000 mL via INTRAVENOUS

## 2014-02-22 MED ORDER — MIDAZOLAM HCL 5 MG/5ML IJ SOLN
INTRAMUSCULAR | Status: DC | PRN
  Administered 2014-02-22: 2 mg via INTRAVENOUS

## 2014-02-22 MED ORDER — HYDROCODONE-ACETAMINOPHEN 5-325 MG PO TABS
1.0000 | ORAL_TABLET | Freq: Four times a day (QID) | ORAL | Status: DC | PRN
  Administered 2014-02-22 – 2014-02-23 (×2): 1 via ORAL
  Filled 2014-02-22 (×2): qty 1

## 2014-02-22 MED ORDER — PROPOFOL 10 MG/ML IV BOLUS
INTRAVENOUS | Status: AC
  Filled 2014-02-22: qty 20

## 2014-02-22 NOTE — Op Note (Signed)
[facility] [place], [id number]   ERCP PROCEDURE REPORT        EXAM DATE: 02/22/2014  PATIENT NAME:          Lawrence Brock, Lawrence Brock          MR #: [id number] BIRTHDATE:       07/05/1970     VISIT #:     [id number] ATTENDING:     Ladene Artist, MD, Marval Regal     STATUS:     outpatient  ASSISTANT:      Cherylynn Ridges and Hilma Favors INDICATIONS:  The patient is a 43 yr old male here for an ERCP due to common bile duct stricture, abnormal liver function test , and established ascending cholangitis. PROCEDURE PERFORMED:     ERCP with removal of calculus/calculi ERCP with stent removal/stentplacement MEDICATIONS:     Per Anesthesia CONSENT: The patient understands the risks and benefits of the procedure and understands that these risks include, but are not limited to: sedation, allergic reaction, infection, perforation and/or bleeding. Alternative means of evaluation and treatment include, among others: physical exam, x-rays, and/or surgical intervention. The patient elects to proceed with this endoscopic procedure.  DESCRIPTION OF PROCEDURE: During intra-op preparation period all mechanical & medical equipment was checked for proper function. Hand hygiene and appropriate measures for infection prevention was taken. After the risks, benefits and alternatives of the procedure were thoroughly explained, Informed was verified, confirmed and timeout was successfully executed by the treatment team. With the patient in left semi-prone position, medications were administered intravenously.The duodenoscope was passed from the mouth into the esophagus and further advanced from the esophagus into the stomach. From stomach scope was directed to the second portion of the duodenum.  Major papilla was aligned with the duodenoscope. The scope position was confirmed fluoroscopically. Rest of the findings/therapeutics are given below. The scope was then completely  withdrawn from the patient and the procedure completed. The pulse, BP, and O2 saturation were monitored and documented by the physician and the nursing staff throughout the entire procedure. The patient was cared for as planned according to standard protocol. The patient was then discharged to recovery in stable condition and with appropriate post procedure care.  The ampulla was located the second portion of the duodenum.  There was evidence of prior sphincterotomy.   A stricture was noted in the distal common bile duct.   Multiple irregular filling defects were seen in the mid common bile duct and proximal common bile duct.   The old plastic biliary stent was removed using a snare. Using a stone extraction balloon the bile duct was swept three times.  Sludge and blood clots were removed from the bile duct successfully.   Under endoscopic and fluoroscopic guidance, a 10 mm x 6 cm covered metal stent was placed in the bile duct with good drainage noted.    ADVERSE EVENT:     There were no complications.  IMPRESSIONS:     1.  Prior sphincterotomy noted 2.  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  Multiple irregular filling defects in the proximal and mid common bile duct; stone extraction balloon removed sludge and blood clots from the bile duct successfully. 4.  Old plastic biliary stent removed and a covered metal biliary stent was placed  RECOMMENDATIONS:     1.   Continue antibiotics 2.   Trend liver enzymes   Ladene Artist, MD, Marval Regal eSigned:  Ladene Artist, MD, Marval Regal  02/22/2014 10:00 AM

## 2014-02-22 NOTE — H&P (View-Only) (Signed)
Referring Provider:  Primary Care Physician:  [name], MD Primary Gastroenterologist:  Dr. [name]  Reason for Consultation: pancreatitis, hepatic abscess  HPI: Lawrence Brock is a 43 y.o. male with a history of chronic pancreatitis.  His first episode was in 2007, and was felt to be secondary to ETOH. He was admitted in June 2015 with pancreatitis. His lipase was 114 and CT findings consistent with acute or chronic pancreatitis with a 2.7 x 4.8 cm pseudocyst, no leukocytosis. Pt reported that he had had a pancreatic stent placed at [facility] 6-7 years ago.He had an EGD by Dr Hilarie Fredrickson in June 2015 at which time he was noted to have gastritis, a single nonbleeding ulcer in the duodenal bulb, and his pancreatic stent was removed.Biopsies were negative for H pylori. The pt was instructed to follow up with Dr Hilarie Fredrickson for repeat imaging, but never did.He says he has never felt well since then, and in fact has missed the past 3 weeks of work because he has felt so poorly.  He presented to the GI office yesterday with complaints of intermittent upper abdominal pain and fevr up to 101. He was drinking fluids but not eating much. He denies use of alcohol since May. He has been using tylenol for fever, and no more than one oxycodone per day for severe pain. 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 Stable in appearance.  Changes of prior pseudocyst in the pancreas. Stable in appearance.  Left renal cyst stable in appearance.  Mild thickening of gallbladder wall of uncertain significance. No cholelithiasis is noted."  CBC showed elevated WBC count at 15., normal lipase. LFT's were elevated at AST 39, ALT 58, ALP 450, and total bili 3.4. They were elevated two weeks ago as well, but were completely normal back  during hospitalization in June.  At his office visit yesterday, he was sent for a cbc, CMP, lipase and CT of the abdomen and pelvis.WBC was 18.6. T bili 3.7, alk phos 982, AST 65, ALT 90, lipase 20. CT showed multifocal masslike areas of heterogeneous enhancement within the liver. IN absence of a known malignancy,  And with the history of fever and elevated WBC, these findings are suspicious for a multifocal infection with extensive microabscess. However, the differential considerations include intrahepatic pseudocysts, and metastatic disease to the liver.IN addition, there is a large amount of abnormal soft tissue in the porta hepatis which encases numerous vascular structures and extends downward toward the head of the pancreas.It is uncertainwhether or not this is an infiltrative malignancy such as acholangiocarcinoma, cephalad migration of a primary pancreatic malignancy, malignant lymphadenopathy, or simply a combination orextensive scar tissue and infiltrative atypical appearing pseudocystrelated to chronic pancreatitis with numerous dilated vessels fromcavernous transformation in the porta hepatis. This does appear to be causing some biliary tract obstruction as evidenced by the mildintrahepatic biliary ductal dilatation discussed above.  Pt's wife says he just completed a course of an antibiotic for a UTI.(cefpodoxime x 7 days).    Past Medical History  Diagnosis Date  . Pancreatitis   . Chronic back pain   . Chronic pancreatitis 09/10/2013    Past Surgical History  Procedure Laterality Date  . Appendectomy    . Esophagogastroduodenoscopy (egd) with propofol N/A 09/14/2013    Procedure: ESOPHAGOGASTRODUODENOSCOPY (EGD) WITH PROPOFOL;  Surgeon: Lajuan Lines  Pyrtle, MD;  Location: [facility];  Service: Gastroenterology;  Laterality: N/A;  . Gastrointestinal stent removal N/A 09/14/2013    Procedure: GASTROINTESTINAL STENT REMOVAL;  Surgeon: Jerene Bears, MD;  Location: [facility];  Service:  Gastroenterology;  Laterality: N/A;  pancreatic stent removal    Prior to Admission medications   Medication Sig Start Date End Date Taking? Authorizing Provider  acetaminophen (TYLENOL) 325 MG tablet Take 650 mg by mouth every 6 (six) hours as needed for fever (fever).    Historical Provider, MD  cefpodoxime (VANTIN) 100 MG tablet Take 100 mg by mouth 2 (two) times daily.    Historical Provider, MD  Multiple Vitamin (MULTIVITAMIN WITH MINERALS) TABS tablet Take 1 tablet by mouth daily.    Historical Provider, MD  omeprazole (PRILOSEC) 20 MG capsule Take 1 capsule (20 mg total) by mouth daily. 02/01/14   Shari A Upstill, PA-C  oxyCODONE-acetaminophen (PERCOCET/ROXICET) 5-325 MG per tablet Take 1-2 tablets by mouth every 4 (four) hours as needed for severe pain. 02/01/14   Dewaine Oats, PA-C    Current Facility-Administered Medications  Medication Dose Route Frequency Provider Last Rate Last Dose  . 0.9 %  sodium chloride infusion   Intravenous Continuous Reyne Dumas, MD      . docusate sodium (COLACE) capsule 100 mg  100 mg Oral BID Reyne Dumas, MD      . enoxaparin (LOVENOX) injection 40 mg  40 mg Subcutaneous Q24H Nayana Abrol, MD      . HYDROmorphone (DILAUDID) injection 0.5 mg  0.5 mg Intravenous Q4H PRN Reyne Dumas, MD      . levalbuterol (XOPENEX) nebulizer solution 0.63 mg  0.63 mg Nebulization Q6H PRN Reyne Dumas, MD      . ondansetron (ZOFRAN) tablet 4 mg  4 mg Oral Q6H PRN Reyne Dumas, MD       Or  . ondansetron (ZOFRAN) injection 4 mg  4 mg Intravenous Q6H PRN Reyne Dumas, MD      . sodium chloride 0.9 % injection 3 mL  3 mL Intravenous Q12H Reyne Dumas, MD        Allergies as of 02/16/2014  . (No Known Allergies)    No family history on file.  History   Social History  . Marital Status: Married    Spouse Name: N/A    Number of Children: N/A  . Years of Education: N/A   Occupational History  . Not on file.   Social History Main Topics  . Smoking status:  Current Every Day Smoker -- 0.50 packs/day    Types: Cigarettes  . Smokeless tobacco: Not on file  . Alcohol Use: No     Comment: former  . Drug Use: No  . Sexual Activity: Yes   Other Topics Concern  . Not on file   Social History Narrative    Review of Systems: Gen: Has had fevers, anorexia, malaise CV: Denies chest pain, angina, palpitations, syncope, orthopnea, PND, peripheral edema, and claudication. Resp: Denies dyspnea at rest, dyspnea with exercise, cough, sputum, wheezing, coughing up blood, and pleurisy. GI: Denies vomiting blood, jaundice, and fecal incontinence.   Denies dysphagia or odynophagia. GU : Denies urinary burning, blood in urine, urinary frequency, urinary hesitancy, nocturnal urination, and urinary incontinence. MS: Denies joint pain, limitation of movement, and swelling, stiffness, low back pain, extremity pain. Denies muscle weakness, cramps, atrophy.  Derm: Denies rash, itching, dry skin, hives, moles, warts, or unhealing ulcers.  Psych: Denies depression, anxiety, memory loss, suicidal ideation,  hallucinations, paranoia, and confusion. Heme: Denies bruising, bleeding, and enlarged lymph nodes. Neuro:  Denies any headaches, dizziness, paresthesias. Endo:  Denies any problems with DM, thyroid, adrenal function.  Physical Exam: Vital signs in last 24 hours: Temp:  [98.1 F (36.7 C)] 98.1 F (36.7 C) (11/25 1357) Pulse Rate:  [80] 80 (11/25 1357) Resp:  [20] 20 (11/25 1357) BP: (96)/(60) 96/60 mmHg (11/25 1357) SpO2:  [99 %] 99 % (11/25 1357)   General:   Alert,  Well-developed, well-nourished, pleasant and cooperative in NAD Head:  Normocephalic and atraumatic. Eyes:  Sclera clear, no icterus.  Conjunctiva pink. Ears:  Normal auditory acuity. Nose:  No deformity, discharge,  or lesions. Mouth:  No deformity or lesions.   Neck:  Supple; no masses or thyromegaly. Lungs:  Clear throughout to auscultation.   No wheezes, crackles, or rhonchi.  Heart:   Regular rate and rhythm; no murmurs, clicks, rubs,  or gallops. Abdomen:  Soft,nontender, BS active, nondistended.   Rectal:  Deferred  Msk:  Symmetrical without gross deformities. . Pulses:  Normal pulses noted. Extremities:  Without clubbing or edema. Neurologic:  Alert and  oriented x4;  grossly normal neurologically. Skin:  Intact without significant lesions or rashes.. Psych:  Alert and cooperative. Normal mood and affect.   Lab Results:  Recent Labs  02/15/14 1522  WBC 18.6*  HGB 10.1*  HCT 30.2*  PLT 276.0   BMET  Recent Labs  02/15/14 1522  NA 131*  K 3.5  CL 98  CO2 22  GLUCOSE 148*  BUN 13  CREATININE 0.8  CALCIUM 8.0*   LFT  Recent Labs  02/15/14 1522  PROT 7.8  ALBUMIN 2.1*  AST 65*  ALT 90*  ALKPHOS 982*  BILITOT 3.7*      Studies/Results: Ct Abdomen Pelvis W Contrast  02/16/2014   CLINICAL DATA:  43 year old male with elevated liver function tests, history of pancreatitis, 3 week history of abdominal pain, weight loss and fever. Elevated white blood cell count.  EXAM: CT ABDOMEN AND PELVIS WITH CONTRAST  TECHNIQUE: Multidetector CT imaging of the abdomen and pelvis was performed using the standard protocol following bolus administration of intravenous contrast.  CONTRAST:  158m OMNIPAQUE IOHEXOL 300 MG/ML  SOLN  COMPARISON:  CT of the abdomen and pelvis 09/08/2013.  FINDINGS: Lower chest: Multifocal linear opacities in the lung bases bilaterally, compatible with areas of scarring and/or subsegmental atelectasis.  Hepatobiliary: Compared to the prior examination there are multiple new areas of heterogeneous hypoattenuation in the liver which are somewhat masslike in appearance. These include an ill-defined area in segments 8 and 4A measuring approximately 5.2 x 4.0 cm (image 7 of series 7), a lesion in the central aspect of predominantly segment 8 (image 15 of series 7), and an elongated lesion which appears to involve portions of both segments 7 and  1 (image 21 of series 7) measuring 9.9 x 3.8 cm. Several other smaller lesions are also noted. 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 In addition, there is some mild intrahepatic biliary ductal dilatation. Importantly, the confluence of the intrahepatic bile ducts, common hepatic duct and proximal common bile duct are well visualized, and the proximal common bile duct measures only 7 mm in diameter. This does appear focally narrowed as it transitions toward the head  of the pancreas in the porta hepatis, best appreciated on image 25 of series 7, likely narrowed from mass effect from the multiple enlarged vessels in this region. The possibility of a centrally obstructing neoplasm in the hepatic hilum is difficult to exclude, although no discrete mass is confidently identified at this time. Gallbladder is moderately distended, but there are no gallstones, no gallbladder wall thickening and no surrounding inflammatory changes.  Pancreas: Previously noted complex cystic lesion in the head and proximal body of the pancreas has largely resolved, presumably a pancreatic pseudocyst on the prior examination. There continues to be changes of chronic pancreatitis with extensive multifocal calcifications throughout the pancreas, chronic pancreatic ductal dilatation, and severe atrophy of the body and tail of the pancreas. Large calcifications are noted in the proximal body and head of the pancreas, and there is a linear calcification or other high density material which appears to extend into the second portion of the duodenum best appreciated on images 32 and 33. Per report, the patient recently had a stent removed from the minor pancreatic duct. This high density material could  represent a fragment of the stent, or could simply represent some dystrophic calcification in the ductal wall which formed around the previous chronically indwelling stent.  Spleen: The spleen is markedly enlarged measuring 16.4 x 5.7 x 13.2 cm (estimated splenic volume of 617 mL).  Adrenals/Urinary Tract: Sub cm low-attenuation lesions in the kidneys bilaterally are too small to definitively characterize, but are statistically favored to represent small cysts. In addition, there is a 3.2 cm simple cyst in the lower pole of the left kidney. No hydroureteronephrosis. Urinary bladder is normal in appearance. Bilateral adrenal glands are normal in appearance.  Stomach/Bowel: Normal appearance of the stomach. No pathologic dilatation of small bowel or colon.  Vascular/Lymphatic: The distal aspect of the splenic vein is occluded shortly before the splenoportal confluence. 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 The distal portal vein is reconstituted in the hepatic hilum, or there is extensive cavernous transformation (the largest varix in this region measures up to 1.5 cm in diameter). The right main portal vein branch appears widely patent, while the left main portal vein branch appears diminutive (image 19 of series 7), and there is low-attenuation surrounding these main portal vein branches extending outward from the hepatic hilum. Whether or not this represents infection, periportal edema, or underlying neoplasm is uncertain on today's examination. Mild atherosclerosis throughout the abdominal and pelvic vasculature, without evidence of arterial aneurysm or dissection. Coronal image 37 of series 604 demonstrates abnormal soft tissue extending downward from the hepatic hilum toward the head of the pancreas, encasing numerous vascular structures. Whether or not this is lymphadenopathy, chronic scarring  related to chronic pancreatitis, pancreatic neoplasm, an infiltrative atypical appearing pseudocyst or other neoplasm such as cholangiocarcinoma is difficult to ascertain.  Reproductive: Prostate gland and seminal vesicles are unremarkable in appearance.  Other: Trace volume of perihepatic ascites tracking downward in the right pericolic gutter. No pneumoperitoneum.  Musculoskeletal: There are no aggressive appearing lytic or blastic lesions noted in the visualized portions of the skeleton.  IMPRESSION: 1. Today's study demonstrates new multifocal mass-like areas of heterogeneous enhancement within the liver. In the absence of a known malignancy, and with the history of fever and elevated white blood cell count, these findings are suspicious for multifocal infection with extensive microabscess ease. However, other differential considerations include intrahepatic pseudocysts,  and metastatic disease to the liver. Correlation with biopsy is recommended if clinically appropriate. 2. In addition, there is a large amount of abnormal soft tissue in the porta hepatis which encases numerous vascular structures and extends downward toward the head of the pancreas. It is uncertain whether or not this is an infiltrative malignancy such as a cholangiocarcinoma, cephalad migration of a primary pancreatic malignancy, malignant lymphadenopathy, or simply a combination of extensive scar tissue and infiltrative atypical appearing pseudocyst related to chronic pancreatitis with numerous dilated vessels from cavernous transformation in the porta hepatis. This does appear to be causing some biliary tract obstruction as evidenced by the mild intrahepatic biliary ductal dilatation discussed above. Correlation with endoscopic ultrasound and carefully placed biopsy of this tissue (to avoid the very large varices in this region) is suggested if clinically appropriate. 3. 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 4.  Persistent linear high-density structure extending from the pancreatic head into the second portion of the duodenum. Given the history of extraction of a stent from the minor duct, this is presumably either some chronic dystrophic calcification in the ductal wall around where the prior stent was placed, or could represent a small fragment of the previously retrieved stent. Attention to this region at time of follow-up endoscopy is suggested. 5. Sequela of chronic pancreatitis redemonstrated, with resolution of the previously noted multi-cystic mass like area in the head of the pancreas compared to the prior examination, which was presumably a pancreatic pseudocyst. 6. Additional incidental findings, as above.   Electronically Signed   By: Vinnie Langton M.D.   On: 02/16/2014 12:30    IMPRESSION/PLAN: 43 yo male with history of chronic pancreatitis with acute pancreatitis and pseudocysts in 08/2013 (due to ETOH), who presents with elevated LFTs, fevr, intermittent abdominal pain, and findings on ultrasound consistent with cirrhosis/portal HTN. CT with hepatic abscesses, and a large amount of abnormal soft tissue in the porta hepatis encasing multiple vascular structures and extending downward toward the head of the pancreas.It is not clear as to whether this is a malignancy such as a cholangiocarcinoma, cephalad migration of a primary pancreatic malignancy, lymphadenopathy, or scar tissue. It appears to be causing some mild intrahepatic ductal dilatation. Pt to be admitted for IV fluids, zosyn, bowel rest. Per medical progress note, ID to see pt as well.  Will review with Dr Deatra Ina as to whether pt a candiate for ERCP or EUS.  Repeat lipase and CMET ordered. AFP and CA-19-9 ordered.    Lonie Rummell, Deloris Ping 02/16/2014,  Pager [phone]

## 2014-02-22 NOTE — Anesthesia Postprocedure Evaluation (Signed)
  Anesthesia Post-op Note  Patient: Lawrence Brock  Procedure(s) Performed: Procedure(s) (LRB): ENDOSCOPIC RETROGRADE CHOLANGIOPANCREATOGRAPHY (ERCP) (N/A)  Patient Location: PACU  Anesthesia Type: General  Level of Consciousness: awake and alert   Airway and Oxygen Therapy: Patient Spontanous Breathing  Post-op Pain: mild  Post-op Assessment: Post-op Vital signs reviewed, Patient's Cardiovascular Status Stable, Respiratory Function Stable, Patent Airway and No signs of Nausea or vomiting  Last Vitals:  Filed Vitals:   02/22/14 1417  BP: 123/91  Pulse: 67  Temp: 36.6 C  Resp: 20    Post-op Vital Signs: stable   Complications: No apparent anesthesia complications

## 2014-02-22 NOTE — Transfer of Care (Signed)
Immediate Anesthesia Transfer of Care Note  Patient: Lawrence Brock  Procedure(s) Performed: Procedure(s): ENDOSCOPIC RETROGRADE CHOLANGIOPANCREATOGRAPHY (ERCP) (N/A)  Patient Location: PACU  Anesthesia Type:General  Level of Consciousness: awake, alert  and oriented  Airway & Oxygen Therapy: Patient Spontanous Breathing and Patient connected to face mask oxygen  Post-op Assessment: Report given to PACU RN and Post -op Vital signs reviewed and stable  Post vital signs: Reviewed and stable  Complications: No apparent anesthesia complications

## 2014-02-22 NOTE — Interval H&P Note (Signed)
History and Physical Interval Note:  02/22/2014 8:46 AM  Lawrence Brock  has presented today for surgery, with the diagnosis of Biliary obstruction  The various methods of treatment have been discussed with the patient and family. After consideration of risks, benefits and other options for treatment, the patient has consented to  Procedure(s): ENDOSCOPIC RETROGRADE CHOLANGIOPANCREATOGRAPHY (ERCP) (N/A) as a surgical intervention .  The patient's history has been reviewed, patient examined, no change in status, stable for surgery.  I have reviewed the patient's chart and labs.  Questions were answered to the patient's satisfaction.     Pricilla Riffle. Fuller Plan MD

## 2014-02-22 NOTE — Progress Notes (Signed)
Patient ID: Lawrence Brock, male   DOB: [birth year], 43 y.o.   MRN: [id number]  [facility] PROGRESS NOTE  Lawrence Brock DGL:[id number] DOB: 27-Sep-1970 DOA: 02/16/2014 PCP: [name], MD  Brief narrative: 43 year old male who is known remotely to Dr. 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 At that time he had a pancreatitic stent in place that was placed in [place] and had been in place for several years. It was removed with snare during EGD on 6/23 by Dr. Hilarie Fredrickson at which time he was also found to have single non-bleeding ulcer in the duodenal bulb. Biopsies were negative for Hpylori. He was supposed to follow-up for repeat imaging in 6-8 weeks but failed to do so. Presented 11/24 with complaints of intermittent upper abdominal pains and fevers up to 101. Is drinking a lot of fluids but not eating much. Denies any ETOH since May, prior to his hospitalization. He has been taking tylenol for fever and no more than 1 Oxycodone per day for severe pain. 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 LFT's were elevated at AST 65, ALT 90, ALP 982, and total bili 3.7,. Increased from 11/17.Patient sent from Dr [name] office for further eval. Patient states he has not had alcohol since his discharge in June, denies any weight loss  Assessment and Plan:    Active Problems:  ? Hepatic abscessed - differential considerations per Ct: intrahepatic pseudocysts, metastatic disease to the liver - ? portal hepatis mass obstructing the area - management per GI team, appreciate assistance - continue Zosyn day #7 and repeat CBC in AM  J-shaped calcification in his pancreas/? Clinical cholangitis  - ? retained stent or dystrophic  calcification of the duct - ERCP 11/27 notable for narrowing within CBD with subsequent stent placement, narrowing related to extrinsic compression from the lesions seen recently in the liver on CT  - ERCP 12/01 notable for stricture in distal CBD with multiple irregular filling defects in proximal and mid CBD, old plastic biliary stent removed and a covered metal biliary stent was placed - recommendation for now is to trend liver enzymes and to continue ABX as noted above   Hypokalemia - supplemented, repeat BMP in AM  Transaminitis - secondary to the above - monitor with daily CMET  Leukocytosis with fever, SIRS - secondary to principal problem  - WBC continue trending down and WNL this AM - continue Zosyn as noted above   Acute on chronic blood loss anemia  - possible related to the above as well - no signs of active bleeding, Hg remains stable ~ 8 - 9 - repeat CBC in AM   Moderate malnutrition in the context of acute illness  - advance diet today   DVT prophylaxis  Changed Lovenox to SCD's 11/27  Code Status: Full Family Communication: Pt and wife at bedside Disposition Plan: Home when medically stable  IV Access:    Peripheral IV Procedures and diagnostic studies:    Ct Abdomen Pelvis W Contrast 02/16/2014 1. New multifocal mass-like areas of heterogeneous enhancement within the liver. In the absence of a known malignancy, and with the history of fever and elevated white blood cell count, ? multifocal infection with extensive microabscess. However, other differential considerations include intrahepatic pseudocysts,  and metastatic disease to the liver. Correlation with biopsy is recommended if clinically appropriate.  2. Large amount of abnormal soft tissue in the porta hepatis which encases numerous vascular structures and extends downward toward the head of the pancreas. It is uncertain whether or not this is an infiltrative malignancy such as a  cholangiocarcinoma, cephalad migration of a primary pancreatic malignancy, malignant lymphadenopathy, or simply a combination of extensive scar tissue and infiltrative atypical appearing pseudocyst related to chronic pancreatitis with numerous dilated vessels from cavernous transformation in the porta hepatis. This does appear to be causing some biliary tract obstruction as evidenced by the mild intrahepatic biliary ductal dilatation discussed above. Correlation with endoscopic ultrasound and carefully placed biopsy of this tissue (to avoid the very large varices in this region) is suggested if clinically appropriate.  3. 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  4. Persistent linear high-density structure extending from the pancreatic head into the second portion of the duodenum. Given the history of extraction of a stent from the minor duct, this is presumably either some chronic dystrophic calcification in the ductal wall around where the prior stent was placed, or could represent a small fragment of the previously retrieved stent. Attention to this region at time of follow-up endoscopy is suggested.  5. Sequela of chronic pancreatitis redemonstrated, with resolution of the previously noted multi-cystic mass like area in the head of the pancreas compared to the prior examination, which was presumably a pancreatic pseudocyst. 6. Additional incidental findings, as above.  Dg Ercp 02/18/2014 Narrowing within the common bile duct with subsequent stent placement. The narrowing appears to be related to extrinsic compression from the lesions seen recently in the liver on CT   Dg Ercp  95/08/2128  Metallic stent placement across the CBD.  These images submitted for radiologic interpretation only  Medical Consultants:    GI Other Consultants:    None  Anti-Infectives:    Zosyn 11/25 -->  Faye Ramsay, MD  [facility] Pager [phone]  If 7PM-7AM, please  contact night-coverage [email or web address] Password [email or web address] 02/22/2014, 3:25 PM   LOS: 6 days   HPI/Subjective: No events overnight.   Objective: Filed Vitals:   02/22/14 1040 02/22/14 1050 02/22/14 1109 02/22/14 1417  BP: 107/83 134/93 115/85 123/91  Pulse: 65 67 56 67  Temp:   97.4 F (36.3 C) 97.9 F (36.6 C)  TempSrc:    Oral  Resp: 18 20 18 20   Height:      Weight:      SpO2: 94% 96% 99% 100%    Intake/Output Summary (Last 24 hours) at 02/22/14 1525 Last data filed at 02/22/14 1226  Gross per 24 hour  Intake   2110 ml  Output      0 ml  Net   2110 ml    Exam:   General:  Pt is alert, follows commands appropriately, not in acute distress  Cardiovascular: Regular rate and rhythm, S1/S2, no murmurs, no rubs, no gallops  Respiratory: Clear to auscultation bilaterally, no wheezing, no crackles, no rhonchi  Abdomen: Soft, tender in epigastric area, non distended, bowel sounds present, no guarding  Extremities: No edema, pulses DP and PT palpable bilaterally  Neuro: Grossly nonfocal  Data Reviewed: Basic Metabolic Panel:  Recent Labs Lab 02/16/14 1430  02/18/14 0418 02/19/14 0529 02/20/14 0540 02/21/14 0545 02/22/14 0451  NA  --   < > 138 136* 135* 133* 138  K  --   < > 3.3* 3.7 3.7 3.9 3.8  CL  --   < > 102 102 99 98 102  CO2  --   < > 23 24 22 23 25   GLUCOSE  --   < > 104* 104* 105* 131* 124*  BUN  --   < > 12 12 11 9 6   CREATININE 0.84  < > 0.77 0.76 0.82 0.73 0.67  CALCIUM  --   < > 8.2* 8.1* 8.3* 8.3* 8.2*  MG 2.2  --   --   --   --   --   --   < > = values in this interval not displayed. Liver Function Tests:  Recent Labs Lab 02/17/14 0505 02/18/14 0418 02/19/14 0529 02/20/14 0540 02/21/14 0545  AST 58* 35 65* 75* 109*  ALT 93* 61* 70* 80* 95*  ALKPHOS 1095* 798* 876* 990* 1003*  BILITOT 2.8* 3.0* 3.2* 4.0* 4.1*  PROT 7.7 6.6 6.3 7.3 7.4  ALBUMIN 1.7* 1.6* 1.5* 1.7* 1.7*   No results for input(s): LIPASE, AMYLASE in the last 168  hours. No results for input(s): AMMONIA in the last 168 hours. CBC:  Recent Labs Lab 02/18/14 0418 02/19/14 0529 02/20/14 0540 02/21/14 0545 02/22/14 0451  WBC 16.3* 11.4* 12.2* 10.3 6.3  HGB 8.6* 8.7* 9.0* 8.9* 8.1*  HCT 25.4* 25.5* 26.9* 26.9* 24.5*  MCV 90.7 90.7 89.1 88.5 89.7  PLT 170 164 201 187 144*    Recent Results (from the past 240 hour(s))  Culture, blood (routine x 2)     Status: None   Collection Time: 02/16/14  3:40 PM  Result Value Ref Range Status   Specimen Description BLOOD LEFT ARM  Final   Special Requests BOTTLES DRAWN AEROBIC ONLY [place]  Final   Culture  Setup Time   Final    02/16/2014 21:50 Performed at [place]   Final    NO GROWTH 5 DAYS Performed at [facility]    Report Status 02/22/2014 FINAL  Final  Culture, blood (routine x 2)     Status: None   Collection Time: 02/16/14  3:46 PM  Result Value Ref Range Status   Specimen Description BLOOD RIGHT ARM  Final   Special Requests BOTTLES DRAWN AEROBIC AND ANAEROBIC 10CC  Final   Culture  Setup Time   Final    02/16/2014 21:42 Performed at [facility]    Culture   Final    NO GROWTH 5 DAYS Performed at [facility]    Report Status 02/22/2014 FINAL  Final     Scheduled Meds: . docusate sodium  100 mg Oral BID  . lip balm      . pantoprazole (PROTONIX) IV  40 mg Intravenous Q12H  . piperacillin-tazobactam (ZOSYN)  IV  3.375 g Intravenous Q8H  . sodium chloride  3 mL Intravenous Q12H   Continuous Infusions: . sodium chloride    . lactated ringers    . lactated ringers 1,000 mL (02/22/14 0842)

## 2014-02-22 NOTE — Plan of Care (Signed)
Problem: Discharge Progression Outcomes Goal: Tolerating diet Outcome: Completed/Met Date Met:  02/22/14

## 2014-02-22 NOTE — Progress Notes (Signed)
Assumed care of pt from previous RN, Pt is alert and oriented, no c/o of pain, no distress noted, family at bed side. Will continue with current plan of care.

## 2014-02-23 ENCOUNTER — Encounter (HOSPITAL_COMMUNITY): Payer: Self-pay | Admitting: Gastroenterology

## 2014-02-23 DIAGNOSIS — K8050 Calculus of bile duct without cholangitis or cholecystitis without obstruction: Secondary | ICD-10-CM

## 2014-02-23 DIAGNOSIS — K8309 Other cholangitis: Secondary | ICD-10-CM | POA: Insufficient documentation

## 2014-02-23 LAB — COMPREHENSIVE METABOLIC PANEL
ALK PHOS: 718 U/L — AB (ref 39–117)
ALT: 49 U/L (ref 0–53)
AST: 27 U/L (ref 0–37)
Albumin: 1.6 g/dL — ABNORMAL LOW (ref 3.5–5.2)
Anion gap: 10 (ref 5–15)
BUN: 8 mg/dL (ref 6–23)
CHLORIDE: 101 meq/L (ref 96–112)
CO2: 25 meq/L (ref 19–32)
Calcium: 8.2 mg/dL — ABNORMAL LOW (ref 8.4–10.5)
Creatinine, Ser: 0.77 mg/dL (ref 0.50–1.35)
GFR calc Af Amer: >90 mL/min (ref >90)
GLUCOSE: 105 mg/dL — AB (ref 70–99)
Potassium: 3.9 mEq/L (ref 3.7–5.3)
SODIUM: 136 meq/L — AB (ref 137–147)
TOTAL PROTEIN: 6.8 g/dL (ref 6.0–8.3)
Total Bilirubin: 2.2 mg/dL — ABNORMAL HIGH (ref 0.3–1.2)

## 2014-02-23 LAB — CBC
HCT: 24.7 % — ABNORMAL LOW (ref 39.0–52.0)
HEMOGLOBIN: 8.3 g/dL — AB (ref 13.0–17.0)
MCH: 30.6 pg (ref 26.0–34.0)
MCHC: 33.6 g/dL (ref 30.0–36.0)
MCV: 91.1 fL (ref 78.0–100.0)
Platelets: 143 10*3/uL — ABNORMAL LOW (ref 150–400)
RBC: 2.71 MIL/uL — ABNORMAL LOW (ref 4.22–5.81)
RDW: 15.2 % (ref 11.5–15.5)
WBC: 5.8 10*3/uL (ref 4.0–10.5)

## 2014-02-23 MED ORDER — AMOXICILLIN-POT CLAVULANATE 875-125 MG PO TABS: 1.0000 | ORAL_TABLET | Freq: Two times a day (BID) | ORAL | Status: DC

## 2014-02-23 MED ORDER — HYDROCODONE-ACETAMINOPHEN 5-325 MG PO TABS: 1.0000 | ORAL_TABLET | Freq: Four times a day (QID) | ORAL | Status: DC | PRN

## 2014-02-23 MED ORDER — DSS 100 MG PO CAPS: 100.0000 mg | ORAL_CAPSULE | Freq: Two times a day (BID) | ORAL | Status: DC | PRN

## 2014-02-23 NOTE — Discharge Summary (Signed)
Physician Discharge Summary  Lawrence Brock MAU:[id number] DOB: 1970-08-23 DOA: 02/16/2014  PCP: [name], MD  Admit date: 02/16/2014 Discharge date: 02/23/2014  Recommendations for Outpatient Follow-up:  1. Please follow-up with GI per scheduled appointment. 2. Please take Augmentin for at least one month if not longer for treatment of liver abscesses. You should continue taking Augmentin until all her liver abscesses resolve. This is per infectious disease recommendation.  Discharge Diagnoses:  Active Problems:   Hepatic abscess   Acute cholangitis   Common bile duct stricture   Choledocholithiasis   Biliary obstruction   Common biliary duct stricture    Discharge Condition: stable   Diet recommendation: as tolerated   History of present illness:  43 year old male who is known remotely to Dr. 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 At that time he had a pancreatitic stent in place that was placed in [place] and had been in place for several years. It was removed with snare during EGD on 6/23 by Dr. Hilarie Fredrickson at which time he was also found to have single non-bleeding ulcer in the duodenal bulb. Biopsies were negative for Hpylori. He was supposed to follow-up for repeat imaging in 6-8 weeks but failed to do so. Presented 11/24 with complaints of intermittent upper abdominal pains and fevers up to 101. Is drinking a lot of fluids but not eating much. Denies any ETOH since May, prior to his hospitalization. He has been taking tylenol for fever and no more than 1 Oxycodone per day for severe pain. 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 LFT's were elevated at AST 65, ALT 90, ALP 982, and total bili 3.7,.  Increased from 11/17.Patient sent from Dr [name] office for further eval. Patient states he has not had alcohol since his discharge in June, denies any weight loss  Assessment and Plan:    Active Problems:  ? Hepatic abscesses - differential considerations per Ct: intrahepatic pseudocysts, metastatic disease to the liver - ? portal hepatis mass obstructing the area - management per GI team - Spoke with infectious disease who recommends Augmentin on discharge until all abscesses resolved so at least for 1 month if not longer. Prescription provided for Augmentin 1 month with 1 refill - Patient was on Zosyn throughout the hospital stay  J-shaped calcification in his pancreas/? Clinical cholangitis  - ? retained stent or dystrophic calcification of the duct - ERCP 11/27 notable for narrowing within CBD with subsequent stent placement, narrowing related to extrinsic compression from the lesions seen recently in the liver on CT  - ERCP 12/01 notable for stricture in distal CBD with multiple irregular filling defects in proximal and mid CBD, old plastic biliary stent removed and a covered metal biliary stent was placed - Liver enzymes trending down. Alkaline phosphatase trended down from thousand range and 700s range. AST and ALT normalized. Bilirubin trended down from 4 to 2  Hypokalemia - supplemented, potassium is within normal limits prior to discharge  Transaminitis - Liver enzymes trending down. Alkaline phosphatase trended down from thousand range and 700s range. AST and ALT normalized. Bilirubin trended down from 4 to 2  Leukocytosis with fever, SIRS - secondary to principal problem  - WBC continue trending down and have normalized. - Patient  will be discharged on Augmentin as indicated above  Acute on chronic blood loss anemia  - possible related to the above as well - no signs of active bleeding, Hg remains stable ~ 8 - 9  Moderate malnutrition in the context of acute  illness  - Diet as tolerated. Patient reports no nausea or vomiting.  DVT prophylaxis  Changed Lovenox to SCD's 11/27  Code Status: Full Family Communication: Pt and wife at bedside   IV Access:    Peripheral IV Procedures and diagnostic studies:    Ct Abdomen Pelvis W Contrast 02/16/2014 1. New multifocal mass-like areas of heterogeneous enhancement within the liver. In the absence of a known malignancy, and with the history of fever and elevated white blood cell count, ? multifocal infection with extensive microabscess. However, other differential considerations include intrahepatic pseudocysts, and metastatic disease to the liver. Correlation with biopsy is recommended if clinically appropriate.  2. Large amount of abnormal soft tissue in the porta hepatis which encases numerous vascular structures and extends downward toward the head of the pancreas. It is uncertain whether or not this is an infiltrative malignancy such as a cholangiocarcinoma, cephalad migration of a primary pancreatic malignancy, malignant lymphadenopathy, or simply a combination of extensive scar tissue and infiltrative atypical appearing pseudocyst related to chronic pancreatitis with numerous dilated vessels from cavernous transformation in the porta hepatis. This does appear to be causing some biliary tract obstruction as evidenced by the mild intrahepatic biliary ductal dilatation discussed above. Correlation with endoscopic ultrasound and carefully placed biopsy of this tissue (to avoid the very large varices in this region) is suggested if clinically appropriate.  3. 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  4. Persistent linear high-density structure extending from the pancreatic head into the second portion of the duodenum. Given the history of extraction of a stent from the minor duct, this is presumably either some chronic dystrophic calcification in the ductal wall around  where the prior stent was placed, or could represent a small fragment of the previously retrieved stent. Attention to this region at time of follow-up endoscopy is suggested.  5. Sequela of chronic pancreatitis redemonstrated, with resolution of the previously noted multi-cystic mass like area in the head of the pancreas compared to the prior examination, which was presumably a pancreatic pseudocyst. 6. Additional incidental findings, as above.  Dg Ercp 02/18/2014 Narrowing within the common bile duct with subsequent stent placement. The narrowing appears to be related to extrinsic compression from the lesions seen recently in the liver on CT   Dg Ercp 40/10/1446 Metallic stent placement across the CBD. These images submitted for radiologic interpretation only  Medical Consultants:    GI Other Consultants:    None  Anti-Infectives:    Zosyn 11/25 --> 02/23/2014  Augmentin on discharge   Signed:  Leisa Lenz, MD  [facility] 02/23/2014, 11:26 AM  Pager #: [phone]   Discharge Exam: Filed Vitals:   02/23/14 0525  BP: 131/92  Pulse: 70  Temp: 98.8 F (37.1 C)  Resp: 18   Filed Vitals:   02/22/14 1109 02/22/14 1417 02/22/14 2114 02/23/14 0525  BP: 115/85 123/91 120/79 131/92  Pulse: 56 67 83 70  Temp: 97.4 F (36.3 C) 97.9 F (36.6 C) 98.7 F (37.1 C) 98.8 F (37.1 C)  TempSrc:  Oral Oral Oral  Resp: 18 20 16 18   Height:      Weight:      SpO2: 99% 100% 95% 99%    General:  Pt is alert, follows commands appropriately, not in acute distress Cardiovascular: Regular rate and rhythm, S1/S2 appreciated  Respiratory: Clear to auscultation bilaterally, no wheezing, no crackles, no rhonchi Abdominal: Soft, non tender, non distended, has biliary stent, bowel sounds +, no guarding Extremities: no edema, no cyanosis, pulses palpable bilaterally DP and PT Neuro: Grossly nonfocal  Discharge Instructions  Discharge Instructions    Call  MD for:  difficulty breathing, headache or visual disturbances    Complete by:  As directed      Call MD for:  persistant dizziness or light-headedness    Complete by:  As directed      Call MD for:  persistant nausea and vomiting    Complete by:  As directed      Call MD for:  severe uncontrolled pain    Complete by:  As directed      Diet - low sodium heart healthy    Complete by:  As directed      Discharge instructions    Complete by:  As directed   1. Please follow-up with GI per scheduled appointment. 2. Please take Augmentin for at least one month if not longer for treatment of liver abscesses. You should continue taking Augmentin until all her liver abscesses resolve. This is per infectious disease recommendation.     Increase activity slowly    Complete by:  As directed             Medication List    STOP taking these medications        acetaminophen 325 MG tablet  Commonly known as:  TYLENOL     cefpodoxime 100 MG tablet  Commonly known as:  VANTIN     ibuprofen 200 MG tablet  Commonly known as:  ADVIL,MOTRIN     oxyCODONE-acetaminophen 5-325 MG per tablet  Commonly known as:  PERCOCET/ROXICET      TAKE these medications        amoxicillin-clavulanate 875-125 MG per tablet  Commonly known as:  AUGMENTIN  Take 1 tablet by mouth 2 (two) times daily.     DSS 100 MG Caps  Take 100 mg by mouth 2 (two) times daily as needed for mild constipation.     HYDROcodone-acetaminophen 5-325 MG per tablet  Commonly known as:  NORCO/VICODIN  Take 1 tablet by mouth every 6 (six) hours as needed for moderate pain.     influenza vac recombinant HA trivalent injection  Commonly known as:  FLUBLOK  Inject 0.5 mLs into the muscle once.     multivitamin with minerals Tabs tablet  Take 1 tablet by mouth daily.     omeprazole 20 MG capsule  Commonly known as:  PRILOSEC  Take 1 capsule (20 mg total) by mouth daily.           Follow-up Information    Follow up with  Erskine Emery, MD. Schedule an appointment as soon as possible for a visit in 1 week.   Specialty:  Gastroenterology   Contact information:   [address] [place] [address] [phone]       Follow up with [name], MD. Schedule an appointment as soon as possible for a visit in 2 weeks.   Specialty:  Family Medicine   Why:  Follow up appt after recent hospitalization   Contact information:   [place] [address] [phone]        The results of significant diagnostics  from this hospitalization (including imaging, microbiology, ancillary and laboratory) are listed below for reference.    Significant Diagnostic Studies: US Abdomen Complete  02/08/2014   CLINICAL DATA:  Right upper quadrant pain for 3 weeks, history of pancreatitis  EXAM: ULTRASOUND ABDOMEN COMPLETE  COMPARISON:  09/08/2013  FINDINGS: Gallbladder: Mild wall thickening is noted without evidence of cholelithiasis. This may be chronic in nature. It was not well appreciated on the prior CT examination  Common bile duct: Diameter: 6 mm  Liver: No focal mass lesion is noted. Venous collaterals are noted surrounding the portal vein as noted on prior CT examination.  IVC: No abnormality visualized.  Pancreas: Pseudocyst is again noted at the head of the pancreas measuring 3.5 cm in dimension. Remainder the pancreas is not well visualized.  Spleen: Enlarged consistent with portal hypertension  Right Kidney: Length: 11.3 cm. Echogenicity within normal limits. No mass or hydronephrosis visualized.  Left Kidney: Length: 11.1 cm.  A 3 cm lower pole cyst is noted.  Abdominal aorta: No aneurysm visualized. The distal aorta is not well appreciated.  Other findings: No free fluid is seen.  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 Stable in appearance.  Changes of prior pseudocyst in the  pancreas.  Stable in appearance.  Left renal cyst stable in appearance.  Mild thickening of gallbladder wall of uncertain significance. No cholelithiasis is noted.   Electronically Signed   By: Inez Catalina M.D.   On: 02/08/2014 22:11   Ct Abdomen Pelvis W Contrast  02/16/2014   CLINICAL DATA:  43 year old male with elevated liver function tests, history of pancreatitis, 3 week history of abdominal pain, weight loss and fever. Elevated white blood cell count.  EXAM: CT ABDOMEN AND PELVIS WITH CONTRAST  TECHNIQUE: Multidetector CT imaging of the abdomen and pelvis was performed using the standard protocol following bolus administration of intravenous contrast.  CONTRAST:  167mL OMNIPAQUE IOHEXOL 300 MG/ML  SOLN  COMPARISON:  CT of the abdomen and pelvis 09/08/2013.  FINDINGS: Lower chest: Multifocal linear opacities in the lung bases bilaterally, compatible with areas of scarring and/or subsegmental atelectasis.  Hepatobiliary: Compared to the prior examination there are multiple new areas of heterogeneous hypoattenuation in the liver which are somewhat masslike in appearance. These include an ill-defined area in segments 8 and 4A measuring approximately 5.2 x 4.0 cm (image 7 of series 7), a lesion in the central aspect of predominantly segment 8 (image 15 of series 7), and an elongated lesion which appears to involve portions of both segments 7 and 1 (image 21 of series 7) measuring 9.9 x 3.8 cm. Several other smaller lesions are also noted. 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 In addition, there is some mild intrahepatic biliary ductal dilatation. Importantly, the confluence of the intrahepatic bile ducts, common hepatic duct and proximal common bile duct are  well visualized, and the proximal common bile duct measures only 7 mm in diameter. This does appear focally narrowed as it transitions toward the head of the pancreas in the porta hepatis, best appreciated on image 25 of series 7, likely narrowed from mass effect from the multiple enlarged vessels in this region. The possibility of a centrally obstructing neoplasm in the hepatic hilum is difficult to  exclude, although no discrete mass is confidently identified at this time. Gallbladder is moderately distended, but there are no gallstones, no gallbladder wall thickening and no surrounding inflammatory changes.  Pancreas: Previously noted complex cystic lesion in the head and proximal body of the pancreas has largely resolved, presumably a pancreatic pseudocyst on the prior examination. There continues to be changes of chronic pancreatitis with extensive multifocal calcifications throughout the pancreas, chronic pancreatic ductal dilatation, and severe atrophy of the body and tail of the pancreas. Large calcifications are noted in the proximal body and head of the pancreas, and there is a linear calcification or other high density material which appears to extend into the second portion of the duodenum best appreciated on images 32 and 33. Per report, the patient recently had a stent removed from the minor pancreatic duct. This high density material could represent a fragment of the stent, or could simply represent some dystrophic calcification in the ductal wall which formed around the previous chronically indwelling stent.  Spleen: The spleen is markedly enlarged measuring 16.4 x 5.7 x 13.2 cm (estimated splenic volume of 617 mL).  Adrenals/Urinary Tract: Sub cm low-attenuation lesions in the kidneys bilaterally are too small to definitively characterize, but are statistically favored to represent small cysts. In addition, there is a 3.2 cm simple cyst in the lower pole of the left kidney. No hydroureteronephrosis.  Urinary bladder is normal in appearance. Bilateral adrenal glands are normal in appearance.  Stomach/Bowel: Normal appearance of the stomach. No pathologic dilatation of small bowel or colon.  Vascular/Lymphatic: The distal aspect of the splenic vein is occluded shortly before the splenoportal confluence. 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 The distal portal vein is reconstituted in the hepatic hilum, or there is extensive cavernous transformation (the largest varix in this region measures up to 1.5 cm in diameter). The right main portal vein branch appears widely patent, while the left main portal vein branch appears diminutive (image 19 of series 7), and there is low-attenuation surrounding these main portal vein branches extending outward from the hepatic hilum. Whether or not this represents infection, periportal edema, or underlying neoplasm is uncertain on today's examination. Mild atherosclerosis throughout the abdominal and pelvic vasculature, without evidence of arterial aneurysm or dissection. Coronal image 37 of series 604 demonstrates abnormal soft tissue extending downward from the hepatic hilum toward the head of the pancreas, encasing numerous vascular structures. Whether or not this is lymphadenopathy, chronic scarring related to chronic pancreatitis, pancreatic neoplasm, an infiltrative atypical appearing pseudocyst or other neoplasm such as cholangiocarcinoma is difficult to ascertain.  Reproductive: Prostate gland and seminal vesicles are unremarkable in appearance.  Other: Trace volume of perihepatic ascites tracking downward in the right pericolic gutter. No pneumoperitoneum.  Musculoskeletal: There are no aggressive appearing lytic or blastic lesions noted in the visualized portions of the skeleton.  IMPRESSION: 1. Today's study demonstrates new multifocal mass-like areas of  heterogeneous enhancement within the liver. In the absence of a known malignancy, and with the history of fever and elevated white blood cell count, these findings are suspicious for multifocal infection with extensive microabscess ease. However, other differential considerations include intrahepatic pseudocysts, and metastatic disease to the liver. Correlation with biopsy is recommended if clinically appropriate. 2. In addition, there is a large amount of abnormal soft tissue in the porta hepatis which encases numerous vascular structures and extends downward toward the head of  the pancreas. It is uncertain whether or not this is an infiltrative malignancy such as a cholangiocarcinoma, cephalad migration of a primary pancreatic malignancy, malignant lymphadenopathy, or simply a combination of extensive scar tissue and infiltrative atypical appearing pseudocyst related to chronic pancreatitis with numerous dilated vessels from cavernous transformation in the porta hepatis. This does appear to be causing some biliary tract obstruction as evidenced by the mild intrahepatic biliary ductal dilatation discussed above. Correlation with endoscopic ultrasound and carefully placed biopsy of this tissue (to avoid the very large varices in this region) is suggested if clinically appropriate. 3. 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 4. Persistent linear high-density structure extending from the pancreatic head into the second portion of the duodenum. Given the history of extraction of a stent from the minor duct, this is presumably either some chronic dystrophic calcification in the ductal wall around where the prior stent was placed, or could represent a small fragment of the previously retrieved stent. Attention to this region at time of follow-up endoscopy is suggested. 5. Sequela of chronic pancreatitis redemonstrated, with resolution of the previously noted multi-cystic mass like area in the head of  the pancreas compared to the prior examination, which was presumably a pancreatic pseudocyst. 6. Additional incidental findings, as above.   Electronically Signed   By: Vinnie Langton M.D.   On: 02/16/2014 12:30   Dg Ercp  02/22/2014   CLINICAL DATA:  Chronic pancreatitis, biliary obstruction, pancreatic stent placement  EXAM: ERCP  TECHNIQUE: Multiple spot images obtained with the fluoroscopic device and submitted for interpretation post-procedure.  COMPARISON:  02/18/2014  FINDINGS: A series of fluoroscopic spot images document endoscopic cannulation of the CBD. A plastic endoscopic stent is in place. There is incomplete opacification of the intrahepatic biliary tree, which appears relatively decompressed centrally. Cystic duct is patent. Gallbladder present. Later images document placement of a metallic biliary stent across the CBD. A smaller caliber pancreatic duct stent remains in place.  IMPRESSION: 1. Metallic stent placement across the CBD.  These images were submitted for radiologic interpretation only. Please see the procedural report for the amount of contrast and the fluoroscopy time utilized.   Electronically Signed   By: Arne Cleveland M.D.   On: 02/22/2014 11:05   Dg Ercp  02/18/2014   CLINICAL DATA:  Multifocal hepatic abscesses, pancreatitis history  EXAM: ERCP  TECHNIQUE: Multiple spot images obtained with the fluoroscopic device and submitted for interpretation post-procedure.  COMPARISON:  02/16/2014  FINDINGS: Initial images demonstrate a small pancreatic duct stent. Injection in the common bile duct demonstrates narrowing in the mid to distal common bile duct causing more proximal obstructive change similar to that seen on recent CT. A plastic stent was then placed in the common bile duct across the narrowing. The pancreatic ductal stent appears stable.  FLUOROSCOPY TIME:  7 min 30 seconds  IMPRESSION: Narrowing within the common bile duct with subsequent stent placement. The  narrowing appears to be related to extrinsic compression from the lesions seen recently in the liver on CT  These images were submitted for radiologic interpretation only. Please see the procedural report for the amount of contrast and the fluoroscopy time utilized.   Electronically Signed   By: Inez Catalina M.D.   On: 02/18/2014 13:15   Dg Abd Acute W/chest  02/01/2014   CLINICAL DATA:  Mid and lower abdominal pain.  Smoker.  EXAM: ACUTE ABDOMEN SERIES (ABDOMEN 2 VIEW & CHEST 1 VIEW)  COMPARISON:  Chest in two  views abdomen and CT abdomen and pelvis 09/08/2013.  FINDINGS: Single view of the chest demonstrates clear lungs and normal heart size. No pneumothorax or pleural effusion.  Two views of the abdomen show no free intraperitoneal air. The bowel gas pattern is unremarkable. Calcification projecting just to the right of midline in the upper abdomen is consistent with calcifications within the pancreas seen on prior CT.  IMPRESSION: No acute finding chest or abdomen.   Electronically Signed   By: Inge Rise M.D.   On: 02/01/2014 05:15    Microbiology: Recent Results (from the past 240 hour(s))  Culture, blood (routine x 2)     Status: None   Collection Time: 02/16/14  3:40 PM  Result Value Ref Range Status   Specimen Description BLOOD LEFT ARM  Final   Special Requests BOTTLES DRAWN AEROBIC ONLY [place]  Final   Culture  Setup Time   Final    02/16/2014 21:50 Performed at [facility]    Culture   Final    NO GROWTH 5 DAYS Performed at [facility]    Report Status 02/22/2014 FINAL  Final  Culture, blood (routine x 2)     Status: None   Collection Time: 02/16/14  3:46 PM  Result Value Ref Range Status   Specimen Description BLOOD RIGHT ARM  Final   Special Requests BOTTLES DRAWN AEROBIC AND ANAEROBIC 10CC  Final   Culture  Setup Time   Final    02/16/2014 21:42 Performed at [facility]    Culture   Final    NO GROWTH 5 DAYS Performed at [facility]    Report Status 02/22/2014 FINAL  Final     Labs: Basic Metabolic Panel:  Recent Labs Lab 02/16/14 1430  02/19/14 0529 02/20/14 0540 02/21/14 0545 02/22/14 0451 02/23/14 0447  NA  --   < > 136* 135* 133* 138 136*  K  --   < > 3.7 3.7 3.9 3.8 3.9  CL  --   < > 102 99 98 102 101  CO2  --   < > 24 22 23 25 25   GLUCOSE  --   < > 104* 105* 131* 124* 105*  BUN  --   < > 12 11 9 6 8   CREATININE 0.84  < > 0.76 0.82 0.73 0.67 0.77  CALCIUM  --   < > 8.1* 8.3* 8.3* 8.2* 8.2*  MG 2.2  --   --   --   --   --   --   < > = values in this interval not displayed. Liver Function Tests:  Recent Labs Lab 02/18/14 0418 02/19/14 0529 02/20/14 0540 02/21/14 0545 02/23/14 0447  AST 35 65* 75* 109* 27  ALT 61* 70* 80* 95* 49  ALKPHOS 798* 876* 990* 1003* 718*  BILITOT 3.0* 3.2* 4.0* 4.1* 2.2*  PROT 6.6 6.3 7.3 7.4 6.8  ALBUMIN 1.6* 1.5* 1.7* 1.7* 1.6*   No results for input(s): LIPASE, AMYLASE in the last 168 hours. No results for input(s): AMMONIA in the last 168 hours. CBC:  Recent Labs Lab 02/19/14 0529 02/20/14 0540 02/21/14 0545 02/22/14 0451 02/23/14 0447  WBC 11.4* 12.2* 10.3 6.3 5.8  HGB 8.7* 9.0* 8.9* 8.1* 8.3*  HCT 25.5* 26.9* 26.9* 24.5* 24.7*  MCV 90.7 89.1 88.5 89.7 91.1  PLT 164 201 187 144* 143*   Cardiac Enzymes: No results for input(s): CKTOTAL, CKMB, CKMBINDEX, TROPONINI in the last 168 hours. BNP: BNP (last  3 results) No results for input(s): PROBNP in the last 8760 hours. CBG: No results for input(s): GLUCAP in the last 168 hours.  Time coordinating discharge: Over 30 minutes

## 2014-02-23 NOTE — Progress Notes (Signed)
    Progress Note   Subjective  feels much better today. No abdominal pain. Feels great   Objective   Vital signs in last 24 hours: Temp:  [97.4 F (36.3 C)-98.8 F (37.1 C)] 98.8 F (37.1 C) (12/02 0525) Pulse Rate:  [56-86] 70 (12/02 0525) Resp:  [13-20] 18 (12/02 0525) BP: (107-134)/(75-95) 131/92 mmHg (12/02 0525) SpO2:  [94 %-100 %] 99 % (12/02 0525) Last BM Date: 03/22/14 General:    Pleasant mmale in NAD Heart:  Regular rate and rhythm Lungs: Respirations even and unlabored, lungs CTA bilaterally Abdomen:  Soft, nontender and nondistended. Normal bowel sounds. Extremities:  Without edema. Neurologic:  Alert and oriented,  grossly normal neurologically. Psych:  Cooperative. Normal mood and affect.  Lab Results:  Recent Labs  02/21/14 0545 02/22/14 0451 02/23/14 0447  WBC 10.3 6.3 5.8  HGB 8.9* 8.1* 8.3*  HCT 26.9* 24.5* 24.7*  PLT 187 144* 143*   BMET  Recent Labs  02/21/14 0545 02/22/14 0451 02/23/14 0447  NA 133* 138 136*  K 3.9 3.8 3.9  CL 98 102 101  CO2 23 25 25   GLUCOSE 131* 124* 105*  BUN 9 6 8   CREATININE 0.73 0.67 0.77  CALCIUM 8.3* 8.2* 8.2*   LFT  Recent Labs  02/23/14 0447  PROT 6.8  ALBUMIN 1.6*  AST 27  ALT 49  ALKPHOS 718*  BILITOT 2.2*   Studies/Results: Dg Ercp  02/22/2014   CLINICAL DATA:  Chronic pancreatitis, biliary obstruction, pancreatic stent placement  EXAM: ERCP  TECHNIQUE: Multiple spot images obtained with the fluoroscopic device and submitted for interpretation post-procedure.  COMPARISON:  02/18/2014  FINDINGS: A series of fluoroscopic spot images document endoscopic cannulation of the CBD. A plastic endoscopic stent is in place. There is incomplete opacification of the intrahepatic biliary tree, which appears relatively decompressed centrally. Cystic duct is patent. Gallbladder present. Later images document placement of a metallic biliary stent across the CBD. A smaller caliber pancreatic duct stent remains in  place.  IMPRESSION: 1. Metallic stent placement across the CBD.  These images were submitted for radiologic interpretation only. Please see the procedural report for the amount of contrast and the fluoroscopy time utilized.   Electronically Signed   By: Arne Cleveland M.D.   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 Admitted with hepatic abscesses. He is s/p ERCP with removal of sludge / blood clots from bile duct. Old plastic stent was exchanged for covered metal stent. LFTs significantly improved today. On Zosyn but at some point will need to transition to PO.  Recommend ID consult    LOS: 7 days   Tye Savoy  02/23/2014, 9:27 AM     Attending physician's note   I have taken an interval history, reviewed the chart and examined the patient. I agree with the Advanced Practitioner's note, impression and recommendations. Augmentin for 1 month as outpatient was recommended. Outpatient GI follow up with Dr. [name]. Consider elective biliary surgery to bypass the CBD stricture as outpatient after his infection has been fully treated. GI signing off.   Pricilla Riffle. Fuller Plan, MD [facility]

## 2014-02-23 NOTE — Discharge Instructions (Signed)
Liver Abscess A liver abscess is a sore inside your liver that contains a thick fluid (pus). Pus in the liver is usually caused by either a bacterial infection or infection with a parasite called an amoeba. CAUSES The liver can get infected:  If an infection somewhere in the abdomen spreads to the liver. This spread of infection could occur from an infected appendix, from an infection in the large intestine (diverticulitis), or from an infection in the gallbladder (cholecystitis) or bile ducts (cholangitis). Bile ducts are the tubes that drain bile out of the liver. Bile is a fluid produced by the liver that helps you digest some foods.  After surgery to the abdomen or after a penetrating abdominal injury. This could occur due to an accident or violence, such as a gunshot or stabbing.  If an infection from a different part of the body spreads to the liver. These infections usually spread to the liver through your bloodstream. A liver abscess can contain:  Bacteria. This is the most common cause, and usually more than one type of bacteria is involved. The bacteria that typically cause the infection are those that normally occupy the inside of your intestines where they cause no harm or illness.  Entamoeba histolytica. This is a tiny parasite called an amoeba.When this parasite is the cause, the disease is called an amebic liver abscess.  Candida. This is a fungus, and it is a rare cause. When this is the cause, there are usually multiple liver abscesses occurring in a person who isvery ill because of an underlying disease such as leukemia or other cancers, or in a person who is receiving nourishment by vein over long periods of time because of other conditions. SYMPTOMS Signs that you might have a liver abscess often come on slowly over many days or a few weeks. The most typical symptoms are fever and pain over or deep within the liver. The liver is in the upper right part of the abdomen, and the  pain may also go up into your right shoulder. Other common symptoms may include:  Malaise. This means you feel uncomfortable. You may feel like you are coming down with something, but you are not yet sick.  Loss of appetite.  Weight loss.  Throwing up (vomiting) or feeling sick to your stomach (nauseous). DIAGNOSIS  To determine whether you have a liver abscess, your caregiver will probably use some of the following methods.  Physical exam. This will include asking about symptoms. Your caregiver may also press on the liver to check for pain.  Blood tests. These tests can tell whether the liver is working like it should. Tests can also check for bacteria or a fungus in the blood, and determine if the infection may be an amebic abscess. They can also check if you have a high number of white blood cells. This is a sign of infection.  Imaging tests, such as CT scans or ultrasounds. These tests take pictures of the liver. This is often the best way to find an abscess.  Needle aspiration. A long needle is put through the skin and into the liver. A CT scan may be used to help find the abscess. Once inside the abscess, a sample of pus is drained out through the needle. The sample is studied under a microscope and a culture is performed. This will show what is causing the abscess. TREATMENT A liver abscess must be treated. Treatment of liver abscesses caused by bacteria involves draining the pus and  taking antibiotics to fight infection. Treatment may take awhile. You might be in a hospital for 2 weeks or more. Some treatments will continue once you are at home.  Antibiotics. You will start taking these medicines as soon as possible. Often, 2 or more antibiotics are needed. Which drugs you take will depend on what bacteria are causing your abscess.  In the hospital, you may be given antibiotics through an intravenous line (IV). A needle will be put in your hand or arm. It is hooked to a plastic tube.  The medicine will flow directly into your body through the IV.  At home, you will probably still need to take antibiotics either by IV or in pill form.You may need to do this for 4 to 6 weeks or longer.  Drainage of a liver abscess caused by bacteria is a necessary part of treatment. Methods include:  Drainage through the skin (percutaneous). Most of the time, a thin tube (catheter) is put in place when needle aspiration is done. This allows the abscess to keep draining. Your caregiver may also use the catheter to flush out the abscess area. When the pus is gone, the catheter will be taken out.  Open drainage. This involves a surgical procedure.It may be needed for very large abscesses. It may also be done if other treatments are not working. Liver abscesses caused by either amoeba or candida do not usually need drainage. However, effective treatment of these abscesses does require many weeks of therapy with a drug or drugs to cure these infections. HOME CARE INSTRUCTIONS   Only take over-the-counter or prescription medicines as directed by your caregiver.  Return for new blood tests and imaging tests as directed by your caregiver.  Keep all follow-up appointments. Your caregiver will need to check whether you are getting better.  Take it easy for awhile. Avoid heavy lifting. Do not do anything that takes extra effort until your caregiver says it is okay. Ask when you will be able to go back to your normal activities. SEEK MEDICAL CARE IF:  You have any questions about medicines.  You do not feel like eating.  You feel nauseous.  You have pain in your abdomen. SEEK IMMEDIATE MEDICAL CARE IF:  Pain in your abdomen suddenly gets worse.  Your skin or eyes look yellow.  You feel like sleeping all the time.  You develop confusion.  You have a fever. Document Released: 06/26/2010 Document Revised: 06/03/2011 Document Reviewed: 06/26/2010 [facility] Patient Information 2015  [place], [place]. This information is not intended to replace advice given to you by your health care provider. Make sure you discuss any questions you have with your health care provider. Endoscopic Retrograde Cholangiopancreatography (ERCP) Endoscopic retrograde cholangiopancreatography (ERCP) is a procedure used to diagnosis many diseases of the pancreas, bile ducts, liver, and gallbladder. During ERCP a thin, lighted tube (endoscope) is passed through the mouth and down the back of the throat into the first part of the small intestine (duodenum). A small, plastic tube (cannula) is then passed through the endoscope and directed into the bile duct or pancreatic duct. Dye is then injected through the cannula and X-rays are taken to study the biliary and pancreatic passageways.  LET [facility] CARE PROVIDER KNOW ABOUT:   Any allergies you have.   All medicines you are taking, including vitamins, herbs, eyedrops, creams, and over-the-counter medicines.   Previous problems you or members of your family have had with the use of anesthetics.   Any blood disorders  you have.   Previous surgeries you have had.   Medical conditions you have. RISKS AND COMPLICATIONS Generally, ERCP is a safe procedure. However, as with any procedure, complications can occur. A simple removal of gallstones has the lowest rate of complications. Higher rates of complication occur in people who have poorly functioning bile or pancreatic ducts. Possible complications include:   Pancreatitis.  Bleeding.  Accidental punctures in the bowel wall, pancreas, or gall bladder.  Gall bladder or bile duct infection. BEFORE THE PROCEDURE   Do not eat or drink anything, including water, for at least 8 hours before the procedure or as directed by your health care provider.   Ask your health care provider whether you should stop taking certain medicines prior to your procedure.   Arrange for someone to drive you home. You  will not be allowed to drive for 23-53 hours after the procedure. PROCEDURE   You will be given medicine through a vein (intravenously) to make you relaxed and sleepy.   You might have a breathing tube placed to give you medicine that makes you sleep (general anesthetic).   Your throat may be sprayed with medicine that numbs the area and prevents gagging (local anesthetic), or you may gargle this medicine.   You will lie on your left side.   The endoscope will be inserted through your mouth and into the duodenum. The tube will not interfere with your breathing. Gagging is prevented by the anesthesia.   While X-rays are being taken, you may be positioned on your stomach.   A small sample of tissue (biopsy) may be removed for examination. AFTER THE PROCEDURE   You will rest in bed until you are fully conscious.   When you first wake up, your throat may feel slightly sore.   You will not be allowed to eat or drink until numbness subsides.   Once you are able to drink, urinate, and sit on the edge of the bed without feeling sick to your stomach (nauseous) or dizzy, you may be allowed to go home. Document Released: 12/04/2000 Document Revised: 12/30/2012 Document Reviewed: 10/20/2012 [facility] Patient Information 2015 [place], [place]. This information is not intended to replace advice given to you by your health care provider. Make sure you discuss any questions you have with your health care provider.

## 2014-02-23 NOTE — Plan of Care (Signed)
Problem: Discharge Progression Outcomes Goal: Discharge plan in place and appropriate Outcome: Completed/Met Date Met:  02/23/14 Goal: Pain controlled with appropriate interventions Outcome: Completed/Met Date Met:  02/23/14     

## 2014-02-25 ENCOUNTER — Emergency Department (HOSPITAL_COMMUNITY)
Admission: EM | Admit: 2014-02-25 | Discharge: 2014-02-25 | Disposition: A | Discharge status: Home / Self Care | Payer: BC Managed Care – PPO | Attending: Emergency Medicine | Admitting: Emergency Medicine

## 2014-02-25 ENCOUNTER — Emergency Department (HOSPITAL_COMMUNITY): Payer: BC Managed Care – PPO

## 2014-02-25 ENCOUNTER — Encounter (HOSPITAL_COMMUNITY): Payer: Self-pay | Admitting: *Deleted

## 2014-02-25 DIAGNOSIS — J90 Pleural effusion, not elsewhere classified: Secondary | ICD-10-CM | POA: Diagnosis not present

## 2014-02-25 DIAGNOSIS — K7689 Other specified diseases of liver: Secondary | ICD-10-CM | POA: Insufficient documentation

## 2014-02-25 DIAGNOSIS — Z792 Long term (current) use of antibiotics: Secondary | ICD-10-CM | POA: Insufficient documentation

## 2014-02-25 DIAGNOSIS — Z720 Tobacco use: Secondary | ICD-10-CM | POA: Diagnosis not present

## 2014-02-25 DIAGNOSIS — Z79899 Other long term (current) drug therapy: Secondary | ICD-10-CM | POA: Insufficient documentation

## 2014-02-25 DIAGNOSIS — K769 Liver disease, unspecified: Secondary | ICD-10-CM

## 2014-02-25 DIAGNOSIS — R1012 Left upper quadrant pain: Secondary | ICD-10-CM | POA: Diagnosis present

## 2014-02-25 DIAGNOSIS — G8929 Other chronic pain: Secondary | ICD-10-CM | POA: Diagnosis not present

## 2014-02-25 DIAGNOSIS — R1010 Upper abdominal pain, unspecified: Secondary | ICD-10-CM

## 2014-02-25 DIAGNOSIS — J918 Pleural effusion in other conditions classified elsewhere: Secondary | ICD-10-CM

## 2014-02-25 LAB — CBC WITH DIFFERENTIAL/PLATELET
Basophils Absolute: 0 10*3/uL (ref 0.0–0.1)
Basophils Relative: 0 % (ref 0–1)
Eosinophils Absolute: 0 10*3/uL (ref 0.0–0.7)
Eosinophils Relative: 0 % (ref 0–5)
HCT: 29.5 % — ABNORMAL LOW (ref 39.0–52.0)
Hemoglobin: 9.6 g/dL — ABNORMAL LOW (ref 13.0–17.0)
Lymphocytes Relative: 12 % (ref 12–46)
Lymphs Abs: 0.8 10*3/uL (ref 0.7–4.0)
MCH: 29.8 pg (ref 26.0–34.0)
MCHC: 32.5 g/dL (ref 30.0–36.0)
MCV: 91.6 fL (ref 78.0–100.0)
Monocytes Absolute: 0.3 10*3/uL (ref 0.1–1.0)
Monocytes Relative: 4 % (ref 3–12)
Neutro Abs: 5.3 10*3/uL (ref 1.7–7.7)
Neutrophils Relative %: 84 % — ABNORMAL HIGH (ref 43–77)
Platelets: 158 10*3/uL (ref 150–400)
RBC: 3.22 MIL/uL — ABNORMAL LOW (ref 4.22–5.81)
RDW: 15.8 % — ABNORMAL HIGH (ref 11.5–15.5)
WBC: 6.3 10*3/uL (ref 4.0–10.5)

## 2014-02-25 LAB — URINALYSIS, ROUTINE W REFLEX MICROSCOPIC
Bilirubin Urine: NEGATIVE
Glucose, UA: NEGATIVE mg/dL
Hgb urine dipstick: NEGATIVE
Ketones, ur: NEGATIVE mg/dL
Leukocytes, UA: NEGATIVE
Nitrite: NEGATIVE
Protein, ur: NEGATIVE mg/dL
Specific Gravity, Urine: 1.008 (ref 1.005–1.030)
Urobilinogen, UA: 0.2 mg/dL (ref 0.0–1.0)
pH: 6.5 (ref 5.0–8.0)

## 2014-02-25 LAB — COMPREHENSIVE METABOLIC PANEL
ALT: 37 U/L (ref 0–53)
AST: 27 U/L (ref 0–37)
Albumin: 2 g/dL — ABNORMAL LOW (ref 3.5–5.2)
Alkaline Phosphatase: 741 U/L — ABNORMAL HIGH (ref 39–117)
Anion gap: 13 (ref 5–15)
BUN: 9 mg/dL (ref 6–23)
CO2: 21 mEq/L (ref 19–32)
Calcium: 9 mg/dL (ref 8.4–10.5)
Chloride: 101 mEq/L (ref 96–112)
Creatinine, Ser: 0.7 mg/dL (ref 0.50–1.35)
GFR calc Af Amer: >90 mL/min (ref >90)
GFR calc non Af Amer: >90 mL/min (ref >90)
Glucose, Bld: 138 mg/dL — ABNORMAL HIGH (ref 70–99)
Potassium: 4.2 mEq/L (ref 3.7–5.3)
Sodium: 135 mEq/L — ABNORMAL LOW (ref 137–147)
Total Bilirubin: 2.4 mg/dL — ABNORMAL HIGH (ref 0.3–1.2)
Total Protein: 8.3 g/dL (ref 6.0–8.3)

## 2014-02-25 LAB — LIPASE, BLOOD: Lipase: 20 U/L (ref 11–59)

## 2014-02-25 MED ORDER — IOHEXOL 300 MG/ML  SOLN
100.0000 mL | Freq: Once | INTRAMUSCULAR | Status: AC | PRN
  Administered 2014-02-25: 100 mL via INTRAVENOUS

## 2014-02-25 MED ORDER — ONDANSETRON HCL 4 MG/2ML IJ SOLN
4.0000 mg | Freq: Once | INTRAMUSCULAR | Status: AC
  Administered 2014-02-25: 4 mg via INTRAVENOUS

## 2014-02-25 MED ORDER — HYDROMORPHONE HCL 1 MG/ML IJ SOLN
1.0000 mg | Freq: Once | INTRAMUSCULAR | Status: AC
  Administered 2014-02-25: 1 mg via INTRAVENOUS

## 2014-02-25 MED ORDER — METRONIDAZOLE 500 MG PO TABS: 500.0000 mg | ORAL_TABLET | Freq: Two times a day (BID) | ORAL | Status: DC

## 2014-02-25 MED ORDER — IOHEXOL 300 MG/ML  SOLN
50.0000 mL | Freq: Once | INTRAMUSCULAR | Status: AC | PRN
  Administered 2014-02-25: 50 mL via ORAL

## 2014-02-25 MED ORDER — SODIUM CHLORIDE 0.9 % IV BOLUS (SEPSIS)
1000.0000 mL | Freq: Once | INTRAVENOUS | Status: AC
  Administered 2014-02-25: 1000 mL via INTRAVENOUS

## 2014-02-25 MED ORDER — ONDANSETRON HCL 4 MG PO TABS: 4.0000 mg | ORAL_TABLET | Freq: Four times a day (QID) | ORAL | Status: DC

## 2014-02-25 NOTE — ED Notes (Signed)
Per EMS, pt has had 8/10 abdominal pain, nausea since 830AM today. Pt has hx of pancreatitis, had stents placed in pancreas last week. Pt denies changes in bowel movement, urinary symptoms. Pt denies drinking alcohol. Pt took oxycodone and amoxicillin at 8AM this morning.

## 2014-02-25 NOTE — Discharge Instructions (Signed)
Please continue to take augmentin as previously prescribed.  Also take Flagyl antibiotic for the full duration as your pain may be related to colitis.  Take pain medication as needed and follow up closely with your GI specialist for further care.  Take Zofran for nausea.  Return to ER if your condition worsen or if you have other concerns.    Colitis Colitis is inflammation of the colon. Colitis can be a short-term or long-standing (chronic) illness. Crohn's disease and ulcerative colitis are 2 types of colitis which are chronic. They usually require lifelong treatment. CAUSES  There are many different causes of colitis, including:  Viruses.  Germs (bacteria).  Medicine reactions. SYMPTOMS   Diarrhea.  Intestinal bleeding.  Pain.  Fever.  Throwing up (vomiting).  Tiredness (fatigue).  Weight loss.  Bowel blockage. DIAGNOSIS  The diagnosis of colitis is based on examination and stool or blood tests. X-rays, CT scan, and colonoscopy may also be needed. TREATMENT  Treatment may include:  Fluids given through the vein (intravenously).  Bowel rest (nothing to eat or drink for a period of time).  Medicine for pain and diarrhea.  Medicines (antibiotics) that kill germs.  Cortisone medicines.  Surgery. HOME CARE INSTRUCTIONS   Get plenty of rest.  Drink enough water and fluids to keep your urine clear or pale yellow.  Eat a well-balanced diet.  Call your caregiver for follow-up as recommended. SEEK IMMEDIATE MEDICAL CARE IF:   You develop chills.  You have an oral temperature above 102 F (38.9 C), not controlled by medicine.  You have extreme weakness, fainting, or dehydration.  You have repeated vomiting.  You develop severe belly (abdominal) pain or are passing bloody or tarry stools. MAKE SURE YOU:   Understand these instructions.  Will watch your condition.  Will get help right away if you are not doing well or get worse. Document Released:  04/18/2004 Document Revised: 06/03/2011 Document Reviewed: 07/14/2009 [facility] Patient Information 2015 [place], [place]. This information is not intended to replace advice given to you by your health care provider. Make sure you discuss any questions you have with your health care provider.

## 2014-02-25 NOTE — ED Provider Notes (Signed)
CSN: [id number]     Arrival date & time 02/25/14  1113 History   First MD Initiated Contact with Patient 02/25/14 1127     Chief Complaint  Patient presents with  . Abdominal Pain     (Consider location/radiation/quality/duration/timing/severity/associated sxs/prior Treatment) HPI  43 year old male with history of chronic pancreatitis, chronic back pain who is brought here via EMS for evaluation of abdominal pain. Patient was seen in the ER 2 weeks ago for complaint of abdominal pain. CT scan at that time demonstrated evidence of acute on chronic pancreatitis. Patient was subsequently discharge with pain medication and antibiotic.  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  Patient came back to the hospital due to worsening pain and was admitted for over a week. During that time he had 2 stent placement to the pancreatic duct. He was discharge 3 days ago and was doing fine.  This morning he reported having sharp pain to his left upper abdomen that felt similar to prior pancreatitis. Pain is intense, 8 out of 10, with associate nausea and vomiting. Has vomit twice, nonbloody nonbilious vomit. He has a normal bowel movement this morning. He denies having fever, chills. He reported having a dry nonproductive cough that is mild. He complained of his abdomen is distended which causes him some discomfort with breathing but no hemoptysis. 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    Past Medical History  Diagnosis Date  . Pancreatitis   . Chronic back pain   . Chronic pancreatitis 09/10/2013   Past Surgical History  Procedure Laterality Date  . Appendectomy    . Esophagogastroduodenoscopy (egd) with propofol N/A 09/14/2013    Procedure: ESOPHAGOGASTRODUODENOSCOPY (EGD) WITH PROPOFOL;  Surgeon: Jerene Bears, MD;  Location: [facility];  Service: Gastroenterology;  Laterality: N/A;  . Gastrointestinal stent removal N/A  09/14/2013    Procedure: GASTROINTESTINAL STENT REMOVAL;  Surgeon: Jerene Bears, MD;  Location: [facility];  Service: Gastroenterology;  Laterality: N/A;  pancreatic stent removal  . Ercp N/A 02/22/2014    Procedure: ENDOSCOPIC RETROGRADE CHOLANGIOPANCREATOGRAPHY (ERCP);  Surgeon: Ladene Artist, MD;  Location: [facility];  Service: Endoscopy;  Laterality: N/A;   No family history on file. History  Substance Use Topics  . Smoking status: Current Every Day Smoker -- 0.50 packs/day    Types: Cigarettes  . Smokeless tobacco: Not on file  . Alcohol Use: No     Comment: former    Review of Systems  All other systems reviewed and are negative.     Allergies  Review of patient's allergies indicates no known allergies.  Home Medications   Prior to Admission medications   Medication Sig Start Date End Date Taking? Authorizing Provider  amoxicillin-clavulanate (AUGMENTIN) 875-125 MG per tablet Take 1 tablet by mouth 2 (two) times daily. 02/23/14   Robbie Lis, MD  docusate sodium 100 MG CAPS Take 100 mg by mouth 2 (two) times daily as needed for mild constipation. 02/23/14   Robbie Lis, MD  HYDROcodone-acetaminophen (NORCO/VICODIN) 5-325 MG per tablet Take 1 tablet by mouth every 6 (six) hours as needed for moderate pain. 02/23/14   Robbie Lis, MD  influenza vac recombinant HA trivalent (FLUBLOK) injection Inject 0.5 mLs into the muscle once.    Historical Provider, MD  Multiple Vitamin (MULTIVITAMIN WITH MINERALS) TABS tablet Take 1 tablet by mouth daily.    Historical Provider, MD  omeprazole (PRILOSEC) 20  MG capsule Take 1 capsule (20 mg total) by mouth daily. 02/01/14   Shari A Upstill, PA-C   BP 128/91 mmHg  Pulse 76  Temp(Src) 97.5 F (36.4 C) (Oral)  Resp 20  SpO2 98% Physical Exam  Constitutional: He appears well-developed and well-nourished. No distress.  HENT:  Head: Atraumatic.  Eyes: Conjunctivae are normal. Scleral icterus is present.  Neck: Normal range of  motion. Neck supple.  Cardiovascular: Normal rate and regular rhythm.   Pulmonary/Chest: Effort normal and breath sounds normal.  Abdominal: He exhibits distension. There is tenderness (left upper quadrant tenderness to palpation in epigastric tenderness without guarding or rebound tenderness).  Neurological: He is alert.  Skin: No rash noted.  Mild jaundice  Psychiatric: He has a normal mood and affect.    ED Course  Procedures (including critical care time)  11:45 AM History of chronic hepatitis and had recent pancreatic stent placement. Patient was discharge 3 days ago and now back with worsening abdominal pain nausea and vomiting. Workup initiated, will treat symptoms.  2:46 PM Patient reported feeling much better after recent initial pain treatment. His labs are reassuring. If CT scan is unremarkable and pain is well controlled, patient may be stable for outpatient follow-up.  4:09 PM On reexamination patient has minimal tenderness to his abdomen, he is sitting upright, comfortable, watching TV. Abdominal and pelvis CT scan in impression is below.  No acute worsening finding.  Pt has appointment to f/u with GI on the 21st.  He has pain medication at home.  He felt comfortable with discharge and follow up.  Pt able to tolerates PO.  Care discussed with Dr. Wilson Singer.    4:17 PM Will add flagyl to go along with augmentin which pt was recently prescribed.  zofran as needed for nausea.    Labs Review Labs Reviewed  CBC WITH DIFFERENTIAL - Abnormal; Notable for the following:    RBC 3.22 (*)    Hemoglobin 9.6 (*)    HCT 29.5 (*)    RDW 15.8 (*)    Neutrophils Relative % 84 (*)    All other components within normal limits  COMPREHENSIVE METABOLIC PANEL - Abnormal; Notable for the following:    Sodium 135 (*)    Glucose, Bld 138 (*)    Albumin 2.0 (*)    Alkaline Phosphatase 741 (*)    Total Bilirubin 2.4 (*)    All other components within normal limits  LIPASE, BLOOD  URINALYSIS,  ROUTINE W REFLEX MICROSCOPIC    Imaging Review Dg Chest 2 View  02/25/2014   CLINICAL DATA:  Upper abdominal pain. Nausea. Symptoms began 4 hr ago. Personal history of pancreatitis with pancreatic stent placements  EXAM: CHEST  2 VIEW  COMPARISON:  02/01/2014  FINDINGS: Heart size is normal. The aorta is unfolded. There are small bilateral pleural effusions with atelectasis at both lung bases. The upper lungs are clear. Biliary ductal stent and pancreatic ductal stent evident in the right upper quadrant.  IMPRESSION: Development of bilateral effusions with atelectasis at both lung bases.   Electronically Signed   By: Nelson Chimes M.D.   On: 02/25/2014 12:41   Ct Abdomen Pelvis W Contrast  02/25/2014   CLINICAL DATA:  Intense abdominal pain and nausea since this morning. History of pancreatitis and recent pancreatic stent placement.  EXAM: CT ABDOMEN AND PELVIS WITH CONTRAST  TECHNIQUE: Multidetector CT imaging of the abdomen and pelvis was performed using the standard protocol following bolus administration of intravenous contrast.  CONTRAST:  73mL OMNIPAQUE IOHEXOL 300 MG/ML SOLN, 110mL OMNIPAQUE IOHEXOL 300 MG/ML SOLN  COMPARISON:  02/16/2014.  FINDINGS: Lower chest: Lung bases show small to moderate bilateral pleural effusions with compressive atelectasis in both lower lobes. Heart size normal. No pericardial effusion.  Hepatobiliary: There is new pneumobilia related to interval placement of a metallic wall stent in the extrahepatic bile duct, terminating in the duodenum just past the ampulla. Intrahepatic biliary duct dilatation persists on the right. Hepatic parenchyma is heterogeneous. There are complex collections of low-attenuation with lobulated peripheral hyper attenuation in the liver, measuring up to 2.9 x 3.5 cm in the left hepatic lobe (series 2, image 11), previously approximately 3.9 x 5.2 cm. Previously seen extensive heterogeneous low-attenuation in the porta hepatis has decreased somewhat  in the interval. 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 Gallbladder is decompressed.  Pancreas: Calcifications are seen in the pancreas. Duct measures up to 6 mm, as before. There may be an intraductal stone within the head of the pancreas (series 2, image 25).  Spleen: Measures approximately 15.4 x 5.5 x 16.7 cm (volume 707 cubic cm). Otherwise unremarkable.  Adrenals/Urinary Tract: Adrenal glands are unremarkable. Low-attenuation lesions in the kidneys measure up to 3.3 cm on the left and are likely cysts. Ureters are decompressed. Bladder is unremarkable.  Stomach/Bowel: Stomach and small bowel are unremarkable. There may be slight wall thickening involving the ascending and transverse colon. Colon is otherwise unremarkable.  Vascular/Lymphatic: Atherosclerotic calcification of the arterial vasculature without abdominal aortic aneurysm. Cavernous transformation of the portal vein, as mentioned above. Dilated varices in the upper abdomen. Scattered lymph nodes in the abdomen do not appear enlarged by CT size criteria.  Reproductive: Prostate is normal in size.  Other: Moderate ascites.  Musculoskeletal:  No worrisome lytic or sclerotic lesions.  IMPRESSION: 1. Interval placement of a metallic wall stent in the extra hepatic bile duct with improvement in porta hepatis low-attenuation and presumed hepatic abscesses in this patient with a working diagnosis of ascending cholangitis. 2. Chronic calcific pancreatitis with ductal dilatation. Question obstructing stone in the pancreatic head. 3. 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 4. Moderate ascites and small to moderate bilateral effusions with compressive atelectasis in the lower lobes. 5. Question slight wall thickening involving the ascending and transverse colon, suggesting an infectious or inflammatory colitis.   Electronically Signed   By: Lorin Picket M.D.   On: 02/25/2014 16:00      EKG Interpretation None      MDM   Final diagnoses:  Upper abdominal pain  Abdominal pain, left upper quadrant  Pleural effusion associated with hepatic disorder    BP 120/91 mmHg  Pulse 64  Temp(Src) 97.9 F (36.6 C) (Oral)  Resp 20  SpO2 100%  I have reviewed nursing notes and vital signs. I personally reviewed the imaging tests through PACS system  I reviewed available ER/hospitalization records thought the EMR     Domenic Moras, PA-C 02/25/14 [place], MD 03/04/14 [phone]

## 2014-02-25 NOTE — ED Notes (Signed)
Patient aware that urine sample is needed, but will try later

## 2014-02-28 MED ORDER — SODIUM CHLORIDE 0.9 % IJ SOLN
INTRAMUSCULAR | Status: DC | PRN
  Administered 2014-02-18: 90 mL

## 2014-03-01 ENCOUNTER — Encounter (HOSPITAL_COMMUNITY): Payer: Self-pay | Admitting: Gastroenterology

## 2014-03-14 ENCOUNTER — Other Ambulatory Visit (INDEPENDENT_AMBULATORY_CARE_PROVIDER_SITE_OTHER): Payer: BC Managed Care – PPO

## 2014-03-14 ENCOUNTER — Encounter: Payer: Self-pay | Admitting: Physician Assistant

## 2014-03-14 ENCOUNTER — Ambulatory Visit (INDEPENDENT_AMBULATORY_CARE_PROVIDER_SITE_OTHER): Payer: BC Managed Care – PPO | Admitting: Physician Assistant

## 2014-03-14 VITALS — BP 130/90 | HR 88 | Ht 68.0 in | Wt 137.8 lb

## 2014-03-14 DIAGNOSIS — K830 Cholangitis: Secondary | ICD-10-CM

## 2014-03-14 DIAGNOSIS — K8309 Other cholangitis: Secondary | ICD-10-CM

## 2014-03-14 DIAGNOSIS — K7469 Other cirrhosis of liver: Secondary | ICD-10-CM

## 2014-03-14 DIAGNOSIS — K750 Abscess of liver: Secondary | ICD-10-CM

## 2014-03-14 LAB — HEPATIC FUNCTION PANEL
ALBUMIN: 3 g/dL — AB (ref 3.5–5.2)
ALT: 17 U/L (ref 0–53)
AST: 21 U/L (ref 0–37)
Alkaline Phosphatase: 173 U/L — ABNORMAL HIGH (ref 39–117)
Bilirubin, Direct: 0.5 mg/dL — ABNORMAL HIGH (ref 0.0–0.3)
Total Bilirubin: 1.4 mg/dL — ABNORMAL HIGH (ref 0.2–1.2)
Total Protein: 7.9 g/dL (ref 6.0–8.3)

## 2014-03-14 LAB — CBC WITH DIFFERENTIAL/PLATELET
Basophils Absolute: 0 10*3/uL (ref 0.0–0.1)
Basophils Relative: 0.4 % (ref 0.0–3.0)
EOS PCT: 1.7 % (ref 0.0–5.0)
Eosinophils Absolute: 0.1 10*3/uL (ref 0.0–0.7)
HEMATOCRIT: 33.5 % — AB (ref 39.0–52.0)
Hemoglobin: 11 g/dL — ABNORMAL LOW (ref 13.0–17.0)
LYMPHS ABS: 0.7 10*3/uL (ref 0.7–4.0)
Lymphocytes Relative: 17.4 % (ref 12.0–46.0)
MCHC: 32.9 g/dL (ref 30.0–36.0)
MCV: 88.3 fl (ref 78.0–100.0)
MONOS PCT: 5.4 % (ref 3.0–12.0)
Monocytes Absolute: 0.2 10*3/uL (ref 0.1–1.0)
Neutro Abs: 3.2 10*3/uL (ref 1.4–7.7)
Neutrophils Relative %: 75.1 % (ref 43.0–77.0)
Platelets: 93 10*3/uL — ABNORMAL LOW (ref 150.0–400.0)
RBC: 3.8 Mil/uL — ABNORMAL LOW (ref 4.22–5.81)
RDW: 18.1 % — ABNORMAL HIGH (ref 11.5–15.5)
WBC: 4.2 10*3/uL (ref 4.0–10.5)

## 2014-03-14 MED ORDER — PANCRELIPASE (LIP-PROT-AMYL) 36000-114000 UNITS PO CPEP: ORAL_CAPSULE | ORAL | Status: DC

## 2014-03-14 NOTE — Progress Notes (Signed)
Patient ID: Lawrence Brock, male   DOB: 1970/07/05, 43 y.o.   MRN: [id number]   Subjective:    Patient ID: Lawrence Brock, male    DOB: 06/30/1970, 43 y.o.   MRN: [id number]  HPI Lawrence Brock is a pleasant 43 year old male known remotely to Dr. [name]. He has a complicated past medical history He has history of alcohol-induced chronic pancreatitis with history of acute pancreatitis earlier this year and a documented common bile duct stricture. He also had an acute upper GI bleed in June 2015 due to a due to a duodenal bulb ulcer. Lawrence Brock was admitted to the hospital 1125 through 02/23/2014 after he presented with complaints of intermittent upper abdominal pain and fevers 201. He has not been drinking since June 2015. During his admission he was found to have multifocal masslike areas of enhancement within the liver consistent with multifocal extensive micro-abscesses. Also with cavernous transformation of the porta hepatis with a large amount of abnormal soft tissue in the porta hepatis, marked splenomegaly. He underwent ERCP with Dr. Fuller Plan on 02/22/2014 with finding of a prior sphincterotomy, a stricture in the common bile duct and multiple filling defects in the proximal and mid-common bile duct as well as clots in the common bile duct. He underwent stone extraction ,and the old plastic biliary stent was removed and a covered metal biliary stent was placed. He ultimately was discharged on Augmentin which she was to continue for at least 1 month or until all abscesses had resolved. The day after he was discharged to return to the emergency room with complaints of acute abdominal pain. CT was done at that time on 02/25/2014 and showed interval placement of a metallic Wallstent in the extrahepatic bile duct with improvement in the porta hepatis low-attenuation M presumed hepatic abscesses with changes of chronic calcific pancreatitis with ductal dilation question obstructing stone in the pancreatic head and cavernous  transformation of the portal vein with dilated abdominal varices and splenomegaly. There was moderate ascites and small to moderate bilateral effusions as well as question slight wall thickening involving the ascending and transverse colon. He comes in the office today for post hospital follow-up. He says he has been doing well. He denies any abdominal pain. No nausea or vomiting his appetite has been improving. He has lost about 30 pounds over the past 6 months but says he has stabilized. He denies any fevers chills or sweats. He has been noticing more frequent bowel movements but denies any diarrhea. He says normally he has 3 bowel movements per day and may be having 4-5 bowel movements per day since discharge from the hospital. He denies any EtOH use since June 2015  Review of Systems Pertinent positive and negative review of systems were noted in the above HPI section.  All other review of systems was otherwise negative.  Outpatient Encounter Prescriptions as of 03/14/2014  Medication Sig  . acetaminophen (TYLENOL) 325 MG tablet Take 650 mg by mouth every 6 (six) hours as needed for fever (fever).  Marland Kitchen amoxicillin-clavulanate (AUGMENTIN) 875-125 MG per tablet Take 1 tablet by mouth 2 (two) times daily.  Marland Kitchen HYDROcodone-acetaminophen (NORCO/VICODIN) 5-325 MG per tablet Take 1 tablet by mouth every 6 (six) hours as needed for moderate pain.  Marland Kitchen omeprazole (PRILOSEC) 20 MG capsule Take 1 capsule (20 mg total) by mouth daily.  . lipase/protease/amylase (CREON) 36000 UNITS CPEP capsule Take 2 tab with each meal.  . [DISCONTINUED] docusate sodium 100 MG CAPS Take 100 mg by mouth 2 (two) times daily  as needed for mild constipation.  . [DISCONTINUED] influenza vac recombinant HA trivalent (FLUBLOK) injection Inject 0.5 mLs into the muscle once.  . [DISCONTINUED] metroNIDAZOLE (FLAGYL) 500 MG tablet Take 1 tablet (500 mg total) by mouth 2 (two) times daily.  . [DISCONTINUED] Multiple Vitamin (MULTIVITAMIN WITH  MINERALS) TABS tablet Take 1 tablet by mouth daily.  . [DISCONTINUED] ondansetron (ZOFRAN) 4 MG tablet Take 1 tablet (4 mg total) by mouth every 6 (six) hours.   No Known Allergies Patient Active Problem List   Diagnosis Date Noted  . Cholangitis   . Biliary obstruction 02/22/2014  . 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 Acute cholangitis 02/18/2014  . Common bile duct stricture 02/18/2014  . Choledocholithiasis 02/18/2014  . Hepatic abscess 02/16/2014  . Elevated LFTs 02/15/2014  . Other specified fever 02/15/2014  . Duodenal ulcer 09/14/2013  . Fever 09/10/2013  . Chronic pancreatitis 09/10/2013  . Hypokalemia 09/09/2013  . Thrombocytopenia, unspecified 09/09/2013  . Normocytic anemia 09/09/2013  . Pancreatic pseudocyst 09/09/2013  . Unspecified constipation 09/09/2013  . Acute pancreatitis 09/08/2013   History   Social History  . Marital Status: Married    Spouse Name: N/A    Number of Children: 4  . Years of Education: N/A   Occupational History  . Chemical Operator    Social History Main Topics  . Smoking status: Current Every Day Smoker -- 0.50 packs/day    Types: Cigarettes  . Smokeless tobacco: Never Used  . Alcohol Use: No     Comment: former  . Drug Use: No  . Sexual Activity: Yes   Other Topics Concern  . Not on file   Social History Narrative    Mr. Hanover family history is not on file.      Objective:    Filed Vitals:   03/14/14 0839  BP: 130/90  Pulse: 88    Physical Exam  well-developed thin male in no acute distress height 5 foot 8 weight 137 vitals as above. HEENT; nontraumatic normocephalic EOMI PERRLA sclera anicteric, Supple; no JVD, Cardiovascular; regular rate and rhythm with S1-S2 no murmur or gallop, Pulmonary; clear bilaterally, Abdomen ;soft bowel sounds are present no palpable mass or hepatosplenomegaly he's nontender, no fluid wave, Rectal ;exam not done, Extremities ;no clubbing cyanosis or edema skin warm and dry,  Psych; mood and affect appropriate     Assessment & Plan:   #1 43 yo male with history of alcohol-induced chronic calcific pancreatitis with history of acute pancreatitis complicated by pseudocyst June 2015. #2 common bile  duct stricture -with admission November 2015 with a ascending cholangitis, status post ERCP balloon sweep and stone extraction as well as placement of metallic biliary stent 09/38/1829 #3 multiple hepatic microabscesses-clinically improving on a long course of Augmentin #4 abdominal varices and splenomegaly #5 weight loss multifactorial  Plan; patient will continue Augmentin 875 twice a day Continue omeprazole 20 mg by mouth daily CBC and hepatic panel today Schedule for a follow-up CT scan of the abdomen and pelvis with contrast for 03/28/2014. Follow-up office visit with Dr. [name] 03/29/2014 Add a trial of Creon 36 ; 2  by mouth with each meal Patient advised to call for advice should he develop any recurrent abdominal pain nausea vomiting fever chills or sweats. He will need eventual EGD for screening for varices   Amy S Esterwood PA-C 03/14/2014

## 2014-03-14 NOTE — Progress Notes (Signed)
i agree with the above note, plan 

## 2014-03-14 NOTE — Patient Instructions (Addendum)
Please go to the basement level to have your labs drawn.  We sent a prescription for Creon to [facility], [place] . Continue Augmentin twice daily. Continue Omeprazole 20 mg daily.   You have been scheduled for a CT scan of the abdomen and pelvis at [facility] ([address]---this is in the same building as Press [facility]).   You are scheduled on Jan 4 th  at 9:00 am . You should arrive  At 8:45 am prior to your appointment time for registration. Please follow the written instructions below on the day of your exam:  WARNING: IF YOU ARE ALLERGIC TO IODINE/X-RAY DYE, PLEASE NOTIFY RADIOLOGY IMMEDIATELY AT [phone]! YOU WILL BE GIVEN A 13 HOUR PREMEDICATION PREP.  1) Do not eat or drink anything after 5:00 am  (4 hours prior to your test) 2) You have been given 2 bottles of oral contrast to drink. The solution may taste better if refrigerated, but do NOT add ice or any other liquid to this solution. Shake well before drinking.    Drink 1 bottle of contrast @ 7:00 am (2 hours prior to your exam)  Drink 1 bottle of contrast @ 8:00 am  (1 hour prior to your exam)  You may take any medications as prescribed with a small amount of water except for the following: Metformin, Glucophage, Glucovance, Avandamet, Riomet, Fortamet, Actoplus Met, Janumet, Glumetza or Metaglip. The above medications must be held the day of the exam AND 48 hours after the exam.  The purpose of you drinking the oral contrast is to aid in the visualization of your intestinal tract. The contrast solution may cause some diarrhea. Before your exam is started, you will be given a small amount of fluid to drink. Depending on your individual set of symptoms, you may also receive an intravenous injection of x-ray contrast/dye. Plan on being at [facility] for 30 minutes or long, depending on the type of exam you are having performed.  If you have any questions regarding your exam or if  you need to reschedule, you may call the CT department at [phone] between the hours of 8:00 am and 5:00 pm, Monday-Friday.  ________________________________________________________________________ We made you an appointment with Dr [name] for 03-29-2014 at 2:15 PM.

## 2014-03-21 ENCOUNTER — Telehealth: Payer: Self-pay | Admitting: Physician Assistant

## 2014-03-21 NOTE — Telephone Encounter (Signed)
To reschedule the appointment to a better time, the phone number is given to the patient.

## 2014-03-28 ENCOUNTER — Ambulatory Visit (INDEPENDENT_AMBULATORY_CARE_PROVIDER_SITE_OTHER)
Admission: RE | Admit: 2014-03-28 | Discharge: 2014-03-28 | Disposition: A | Discharge status: Home / Self Care | Payer: Self-pay | Source: Ambulatory Visit | Attending: Physician Assistant | Admitting: Physician Assistant

## 2014-03-28 ENCOUNTER — Inpatient Hospital Stay: Admission: RE | Admit: 2014-03-28 | Payer: BC Managed Care – PPO | Source: Ambulatory Visit

## 2014-03-28 DIAGNOSIS — K7469 Other cirrhosis of liver: Secondary | ICD-10-CM

## 2014-03-28 DIAGNOSIS — K8309 Other cholangitis: Secondary | ICD-10-CM

## 2014-03-28 DIAGNOSIS — K830 Cholangitis: Secondary | ICD-10-CM

## 2014-03-28 DIAGNOSIS — K750 Abscess of liver: Secondary | ICD-10-CM

## 2014-03-28 MED ORDER — IOHEXOL 300 MG/ML  SOLN
100.0000 mL | Freq: Once | INTRAMUSCULAR | Status: AC | PRN
  Administered 2014-03-28: 100 mL via INTRAVENOUS

## 2014-03-29 ENCOUNTER — Ambulatory Visit (INDEPENDENT_AMBULATORY_CARE_PROVIDER_SITE_OTHER): Payer: BC Managed Care – PPO | Admitting: Gastroenterology

## 2014-03-29 ENCOUNTER — Other Ambulatory Visit (INDEPENDENT_AMBULATORY_CARE_PROVIDER_SITE_OTHER): Payer: Self-pay

## 2014-03-29 ENCOUNTER — Encounter: Payer: Self-pay | Admitting: Gastroenterology

## 2014-03-29 VITALS — BP 138/90 | HR 78 | Ht 68.0 in | Wt 146.2 lb

## 2014-03-29 DIAGNOSIS — K861 Other chronic pancreatitis: Secondary | ICD-10-CM

## 2014-03-29 DIAGNOSIS — K831 Obstruction of bile duct: Secondary | ICD-10-CM

## 2014-03-29 LAB — COMPREHENSIVE METABOLIC PANEL
ALBUMIN: 3.5 g/dL (ref 3.5–5.2)
ALK PHOS: 87 U/L (ref 39–117)
ALT: 26 U/L (ref 0–53)
AST: 26 U/L (ref 0–37)
BUN: 14 mg/dL (ref 6–23)
CO2: 25 meq/L (ref 19–32)
Calcium: 8.9 mg/dL (ref 8.4–10.5)
Chloride: 109 mEq/L (ref 96–112)
Creatinine, Ser: 1.3 mg/dL (ref 0.4–1.5)
GFR: 64.13 mL/min (ref >60.00)
GLUCOSE: 129 mg/dL — AB (ref 70–99)
POTASSIUM: 3.7 meq/L (ref 3.5–5.1)
SODIUM: 139 meq/L (ref 135–145)
Total Bilirubin: 1.5 mg/dL — ABNORMAL HIGH (ref 0.2–1.2)
Total Protein: 7.4 g/dL (ref 6.0–8.3)

## 2014-03-29 LAB — CBC WITH DIFFERENTIAL/PLATELET
Basophils Absolute: 0 10*3/uL (ref 0.0–0.1)
Basophils Relative: 0.3 % (ref 0.0–3.0)
EOS PCT: 3.9 % (ref 0.0–5.0)
Eosinophils Absolute: 0.1 10*3/uL (ref 0.0–0.7)
HCT: 33.1 % — ABNORMAL LOW (ref 39.0–52.0)
HEMOGLOBIN: 11 g/dL — AB (ref 13.0–17.0)
Lymphocytes Relative: 20.8 % (ref 12.0–46.0)
Lymphs Abs: 0.7 10*3/uL (ref 0.7–4.0)
MCHC: 33.2 g/dL (ref 30.0–36.0)
MCV: 87.5 fl (ref 78.0–100.0)
MONOS PCT: 5.6 % (ref 3.0–12.0)
Monocytes Absolute: 0.2 10*3/uL (ref 0.1–1.0)
Neutro Abs: 2.4 10*3/uL (ref 1.4–7.7)
Neutrophils Relative %: 69.4 % (ref 43.0–77.0)
Platelets: 64 10*3/uL — ABNORMAL LOW (ref 150.0–400.0)
RBC: 3.78 Mil/uL — ABNORMAL LOW (ref 4.22–5.81)
RDW: 17.5 % — ABNORMAL HIGH (ref 11.5–15.5)
WBC: 3.4 10*3/uL — ABNORMAL LOW (ref 4.0–10.5)

## 2014-03-29 NOTE — Patient Instructions (Addendum)
We will arrange referral to Dr. Birdie Sons to consider pancreatic surgery or biliary bypass surgery (for chronic pancreatitis, bile duct obstruction).  You will hear from that office directly, if you do not please call [phone] and I will confirm the appointment. Stay on augmentin 1 pill twice daily for at least another 6-8 weeks, call when you need a refill. You will have labs checked today in the basement lab.  Please head down after you check out with the front desk  (cbc, cmet). Please return to see Dr. [name] on 05/31/14 8:45 am.

## 2014-03-29 NOTE — Progress Notes (Signed)
HPI: This is a  very pleasant 44 year old man whom I last saw 8-9 years ago. We do not have documentation available from those earlier visits. He does recall that I referred him to [facility] where he underwent  stenting endoscopically. In retrospect it appears that he had a minor duct pancreatic duct stent placed but never retrieved. He came back to our attention here in [place] about 6 months ago when he was hospitalized with acute on chronic pancreatitis. 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 He had fluid collections in and around the head of his pancreas this past summer. He was noted to likely have a retained pancreatic duct stent which one of my partners removed from his minor pancreatic duct endoscopically with a snare. Interestingly there is still on imaging apparent calcification looks like a stent in that same region but I do believe this is dystrophic calcification related from the chronic indwelling stent which was in place for 7 or 8 years previously. He continued to binge drink. He was admitted a month or so ago with cholangitis, fevers, chills, elevated liver tests. One of my partners proceed with ERCP and removed numerous small blood clots from his bile ducts. A plastic biliary stent was placed. A few days later his bilirubin was noted to be increasing again and ERCP was repeated. This time a metal self-expanding fully covered, removal biliary stent was placed. His liver tests gradually improved. Multiple small suspected liver abscesses improved in size. He has been out of the hospital now for 3 or 4 weeks on daily, twice daily Augmentin. He has had no fevers or chills. 2 weeks ago he was seen in our office. His antibiotics were continued, liver tests were rechecked and they appeared to be improving again. Repeat CT scan was performed yesterday and it shows that the abscesses are probably continuing to improve, they are small now.  Had stent placed by  [facility] for unclear reasons.  He is still taking antibiotics orally.  Had a cramping discomfort last week for 2 minutes.  No fevers or chills.  Last etoh was June 2015; previously binge type alcoholic.  HE has been eating very well.    Up 9 pounds in two weeks.     Past Medical History  Diagnosis Date  . Pancreatitis   . Chronic back pain   . Chronic pancreatitis 09/10/2013    Past Surgical History  Procedure Laterality Date  . Appendectomy    . Esophagogastroduodenoscopy (egd) with propofol N/A 09/14/2013    Procedure: ESOPHAGOGASTRODUODENOSCOPY (EGD) WITH PROPOFOL;  Surgeon: Jerene Bears, MD;  Location: [facility];  Service: Gastroenterology;  Laterality: N/A;  . Gastrointestinal stent removal N/A 09/14/2013    Procedure: GASTROINTESTINAL STENT REMOVAL;  Surgeon: Jerene Bears, MD;  Location: [facility];  Service: Gastroenterology;  Laterality: N/A;  pancreatic stent removal  . Ercp N/A 02/22/2014    Procedure: ENDOSCOPIC RETROGRADE CHOLANGIOPANCREATOGRAPHY (ERCP);  Surgeon: Ladene Artist, MD;  Location: [facility];  Service: Endoscopy;  Laterality: N/A;  . Ercp N/A 02/18/2014    Procedure: ENDOSCOPIC RETROGRADE CHOLANGIOPANCREATOGRAPHY (ERCP);  Surgeon: Inda Castle, MD;  Location: [facility];  Service: Gastroenterology;  Laterality: N/A;    Current Outpatient Prescriptions  Medication Sig Dispense Refill  . acetaminophen (TYLENOL) 325 MG tablet Take 650 mg by mouth every 6 (six) hours as needed for fever (fever).    Marland Kitchen amoxicillin-clavulanate (AUGMENTIN) 875-125 MG per tablet Take 1 tablet by mouth 2 (two) times daily. [place]  tablet 1  . HYDROcodone-acetaminophen (NORCO/VICODIN) 5-325 MG per tablet Take 1 tablet by mouth every 6 (six) hours as needed for moderate pain. 20 tablet 0  . lipase/protease/amylase (CREON) 36000 UNITS CPEP capsule Take 2 tab with each meal. 180 capsule 3  . omeprazole (PRILOSEC) 20 MG capsule Take 1 capsule (20 mg total) by mouth daily. 30 capsule 0    No current facility-administered medications for this visit.    Allergies as of 03/29/2014  . (No Known Allergies)    History reviewed. No pertinent family history.  History   Social History  . Marital Status: Married    Spouse Name: N/A    Number of Children: 4  . Years of Education: N/A   Occupational History  . Chemical Operator    Social History Main Topics  . Smoking status: Current Every Day Smoker -- 0.50 packs/day    Types: Cigarettes  . Smokeless tobacco: Never Used  . Alcohol Use: No     Comment: former  . Drug Use: No  . Sexual Activity: Yes   Other Topics Concern  . Not on file   Social History Narrative      Physical Exam: BP 138/90 mmHg  Pulse 78  Ht 5\' 8"  (1.727 m)  Wt 146 lb 3.2 oz (66.316 kg)  BMI 22.23 kg/m2  SpO2 98% Constitutional: generally well-appearing Psychiatric: alert and oriented x3 Abdomen: soft, nontender, nondistended, no obvious ascites, no peritoneal signs, normal bowel sounds     Assessment and plan: 44 y.o. male with complex pancreatic or biliary hepatic disease  I suspect his inciting illness is chronic calcific pancreatitis. This is probably from chronic intermittent alcohol abuse. I believe this has caused biliary stricture that resulted in cholangitis and liver abscesses. Today he will have a repeat set of liver tests done. I am going to refer him to a pancreatic or biliary surgeon at [facility] given the complexity of his disease. I think he may indeed benefit from Anderson surgery or perhaps simply biliary diversion. 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 He will stay on antibiotics for now. Augmentin 1 pill twice daily. His biliary stent is in good position and seems to be functioning according to recent labs, it is fully covered and therefore removable either endoscopically or surgically. He will return to see me in 2 months and sooner if needed but I  hope to have him lined up for more permanent treatment for his complex hepatobiliary disease. He is 44 years old and I tried to explain to him that this needs to be fixed correctly, permanently if possible.

## 2014-03-31 ENCOUNTER — Other Ambulatory Visit (INDEPENDENT_AMBULATORY_CARE_PROVIDER_SITE_OTHER): Payer: BLUE CROSS/BLUE SHIELD

## 2014-03-31 DIAGNOSIS — K861 Other chronic pancreatitis: Secondary | ICD-10-CM | POA: Diagnosis not present

## 2014-03-31 DIAGNOSIS — K831 Obstruction of bile duct: Secondary | ICD-10-CM

## 2014-03-31 LAB — CBC WITH DIFFERENTIAL/PLATELET
Basophils Absolute: 0 10*3/uL (ref 0.0–0.1)
Basophils Relative: 0.4 % (ref 0.0–3.0)
Eosinophils Absolute: 0.1 10*3/uL (ref 0.0–0.7)
Eosinophils Relative: 3.5 % (ref 0.0–5.0)
HEMATOCRIT: 33.3 % — AB (ref 39.0–52.0)
HEMOGLOBIN: 11.2 g/dL — AB (ref 13.0–17.0)
LYMPHS ABS: 0.7 10*3/uL (ref 0.7–4.0)
Lymphocytes Relative: 16.5 % (ref 12.0–46.0)
MCHC: 33.6 g/dL (ref 30.0–36.0)
MCV: 87 fl (ref 78.0–100.0)
MONO ABS: 0.3 10*3/uL (ref 0.1–1.0)
MONOS PCT: 7.3 % (ref 3.0–12.0)
NEUTROS ABS: 2.9 10*3/uL (ref 1.4–7.7)
Neutrophils Relative %: 72.3 % (ref 43.0–77.0)
PLATELETS: 64 10*3/uL — AB (ref 150.0–400.0)
RBC: 3.82 Mil/uL — ABNORMAL LOW (ref 4.22–5.81)
RDW: 17.2 % — ABNORMAL HIGH (ref 11.5–15.5)
WBC: 4 10*3/uL (ref 4.0–10.5)

## 2014-04-01 ENCOUNTER — Other Ambulatory Visit: Payer: Self-pay

## 2014-04-01 MED ORDER — CIPROFLOXACIN HCL 500 MG PO TABS: 500.0000 mg | ORAL_TABLET | Freq: Two times a day (BID) | ORAL | Status: DC

## 2014-04-01 MED ORDER — METRONIDAZOLE 250 MG PO TABS: 250.0000 mg | ORAL_TABLET | Freq: Three times a day (TID) | ORAL | Status: DC

## 2014-04-01 NOTE — Telephone Encounter (Signed)
The prescriptions have been sent to the pharmacy, I have a staff message to set the CT up in 1 month.  I have left a message for the pt to return my call for these recommendations.

## 2014-04-04 ENCOUNTER — Telehealth: Payer: Self-pay | Admitting: Gastroenterology

## 2014-04-04 DIAGNOSIS — K750 Abscess of liver: Secondary | ICD-10-CM

## 2014-04-04 DIAGNOSIS — D696 Thrombocytopenia, unspecified: Secondary | ICD-10-CM

## 2014-04-04 NOTE — Telephone Encounter (Addendum)
curbsided ID about his Abx, liver abscess;  They agreed with plan (cipro/flagyl for now, CT scan in 3-4 weeks).    Patty, He also needs repeat CBC in 7-10 days to make sure plts recover.  Thanks   Notes Recorded by Barron Alvine, CMA on 04/01/2014 at 3:25 PM See phone note. Rx sent to the pharmacy. Left message on machine to call back

## 2014-04-05 NOTE — Telephone Encounter (Signed)
Patient wants status of appt that he is supposed to get in [place].

## 2014-04-05 NOTE — Telephone Encounter (Signed)
Yes, he needed to stop the augmentin and start the new abx called in (cipro and flagyl). CBC in 10 days

## 2014-04-05 NOTE — Telephone Encounter (Signed)
Pt aware and will have labs and CT as recommended and will stop augmentin

## 2014-04-05 NOTE — Telephone Encounter (Signed)
See alternate note  

## 2014-04-05 NOTE — Telephone Encounter (Signed)
Should the pt stop the augmentin?

## 2014-04-06 NOTE — Telephone Encounter (Signed)
CT scan abd (liver protocol) to check size of liver abscesses in 1 month

## 2014-04-06 NOTE — Telephone Encounter (Signed)
You have been scheduled for a CT scan of the abdomen and pelvis at [facility] ([address]---this is in the same building as [facility]).   You are scheduled on  at . You should arrive 15 minutes prior to your appointment time for registration. Please follow the written instructions below on the day of your exam:  WARNING: IF YOU ARE ALLERGIC TO IODINE/X-RAY DYE, PLEASE NOTIFY RADIOLOGY IMMEDIATELY AT [phone]! YOU WILL BE GIVEN A 13 HOUR PREMEDICATION PREP.  1) Do not eat or drink anything after  (4 hours prior to your test) 2) You have been given 2 bottles of oral contrast to drink. The solution may taste better if refrigerated, but do NOT add ice or any other liquid to this solution. Shake  well before drinking.    Drink 1 bottle of contrast @  (2 hours prior to your exam)  Drink 1 bottle of contrast @  (1 hour prior to your exam)  You may take any medications as prescribed with a small amount of water except for the following: Metformin, Glucophage, Glucovance, Avandamet, Riomet, Fortamet, Actoplus Met, Janumet, Glumetza or Metaglip. The above medications must be held the day of the exam AND 48 hours after the exam.  The purpose of you drinking the oral contrast is to aid in the visualization of your intestinal tract. The contrast solution may cause some diarrhea. Before your exam is started, you will be given a small amount of fluid to drink. Depending on your individual set of symptoms, you may also receive an intravenous injection of x-ray contrast/dye. Plan on being at [facility] for 30 minutes or long, depending on the type of exam you are having performed.  This test typically takes 30-45 minutes to complete.  If you have any questions regarding your exam or if you need to reschedule, you may call the CT department at [phone] between the hours of 8:00 am and 5:00 pm,  Monday-Friday.  ________________________________________________________________________ The pt has been instructed

## 2014-04-07 ENCOUNTER — Encounter (HOSPITAL_COMMUNITY): Payer: Self-pay | Admitting: Gastroenterology

## 2014-04-11 ENCOUNTER — Ambulatory Visit (INDEPENDENT_AMBULATORY_CARE_PROVIDER_SITE_OTHER)
Admission: RE | Admit: 2014-04-11 | Discharge: 2014-04-11 | Disposition: A | Discharge status: Home / Self Care | Payer: BLUE CROSS/BLUE SHIELD | Source: Ambulatory Visit | Attending: Gastroenterology | Admitting: Gastroenterology

## 2014-04-11 DIAGNOSIS — K750 Abscess of liver: Secondary | ICD-10-CM | POA: Diagnosis not present

## 2014-04-11 DIAGNOSIS — D696 Thrombocytopenia, unspecified: Secondary | ICD-10-CM | POA: Diagnosis not present

## 2014-04-11 MED ORDER — IOHEXOL 300 MG/ML  SOLN
100.0000 mL | Freq: Once | INTRAMUSCULAR | Status: AC | PRN
  Administered 2014-04-11: 100 mL via INTRAVENOUS

## 2014-04-12 ENCOUNTER — Telehealth: Payer: Self-pay

## 2014-04-12 NOTE — Telephone Encounter (Signed)
Dr Eugenia Pancoast called regarding this pt, he is aware that you are out of the office this week.  He will expect a call next week at [phone]

## 2014-04-18 ENCOUNTER — Other Ambulatory Visit: Payer: Self-pay

## 2014-04-18 ENCOUNTER — Other Ambulatory Visit: Payer: Self-pay | Admitting: Gastroenterology

## 2014-04-18 DIAGNOSIS — K831 Obstruction of bile duct: Secondary | ICD-10-CM

## 2014-04-18 NOTE — Telephone Encounter (Signed)
We spoke

## 2014-04-19 ENCOUNTER — Telehealth: Payer: Self-pay | Admitting: Gastroenterology

## 2014-04-19 NOTE — Telephone Encounter (Signed)
Rec'd from [facility] forward 10 pages to Dr. [name]

## 2014-04-21 ENCOUNTER — Other Ambulatory Visit (INDEPENDENT_AMBULATORY_CARE_PROVIDER_SITE_OTHER): Payer: BLUE CROSS/BLUE SHIELD

## 2014-04-21 ENCOUNTER — Telehealth: Payer: Self-pay | Admitting: Gastroenterology

## 2014-04-21 DIAGNOSIS — D696 Thrombocytopenia, unspecified: Secondary | ICD-10-CM

## 2014-04-21 LAB — CBC WITH DIFFERENTIAL/PLATELET
Basophils Absolute: 0 10*3/uL (ref 0.0–0.1)
Basophils Relative: 0.4 % (ref 0.0–3.0)
EOS PCT: 6.8 % — AB (ref 0.0–5.0)
Eosinophils Absolute: 0.2 10*3/uL (ref 0.0–0.7)
HEMATOCRIT: 36.6 % — AB (ref 39.0–52.0)
Hemoglobin: 12.6 g/dL — ABNORMAL LOW (ref 13.0–17.0)
LYMPHS PCT: 17.7 % (ref 12.0–46.0)
Lymphs Abs: 0.6 10*3/uL — ABNORMAL LOW (ref 0.7–4.0)
MCHC: 34.5 g/dL (ref 30.0–36.0)
MCV: 85.6 fl (ref 78.0–100.0)
MONOS PCT: 6.1 % (ref 3.0–12.0)
Monocytes Absolute: 0.2 10*3/uL (ref 0.1–1.0)
NEUTROS ABS: 2.3 10*3/uL (ref 1.4–7.7)
Neutrophils Relative %: 69 % (ref 43.0–77.0)
Platelets: 68 10*3/uL — ABNORMAL LOW (ref 150.0–400.0)
RBC: 4.27 Mil/uL (ref 4.22–5.81)
RDW: 16.3 % — ABNORMAL HIGH (ref 11.5–15.5)
WBC: 3.4 10*3/uL — AB (ref 4.0–10.5)

## 2014-04-22 ENCOUNTER — Other Ambulatory Visit: Payer: Self-pay

## 2014-04-22 DIAGNOSIS — K858 Other acute pancreatitis without necrosis or infection: Secondary | ICD-10-CM

## 2014-04-22 MED ORDER — PANCRELIPASE (LIP-PROT-AMYL) 36000-114000 UNITS PO CPEP: ORAL_CAPSULE | ORAL | Status: DC

## 2014-04-22 NOTE — Telephone Encounter (Signed)
Notified. Patient needed a refill on Creon. Medication refilled. Labs entered.

## 2014-04-22 NOTE — Telephone Encounter (Signed)
Yes, he needs to stay on that medicine  Also, please let him know his CBC looked unchanged.  Needs repeat CBC, cmet in 2 weeks.

## 2014-05-03 ENCOUNTER — Telehealth: Payer: Self-pay

## 2014-05-03 DIAGNOSIS — K750 Abscess of liver: Secondary | ICD-10-CM

## 2014-05-03 NOTE — Telephone Encounter (Signed)
-----   Message from Barron Alvine, [place] sent at 04/01/2014  3:25 PM EST ----- CT scan abd (liver protocol) to check size of liver abscesses in 1 month

## 2014-05-05 ENCOUNTER — Telehealth: Payer: Self-pay | Admitting: Gastroenterology

## 2014-05-05 NOTE — Telephone Encounter (Signed)
Left message on machine to call back  

## 2014-05-06 ENCOUNTER — Telehealth: Payer: Self-pay

## 2014-05-06 ENCOUNTER — Ambulatory Visit: Payer: BC Managed Care – PPO | Admitting: Gastroenterology

## 2014-05-06 ENCOUNTER — Other Ambulatory Visit: Payer: Self-pay | Admitting: *Deleted

## 2014-05-06 MED ORDER — PANCRELIPASE (LIP-PROT-AMYL) 36000-114000 UNITS PO CPEP: ORAL_CAPSULE | ORAL | Status: DC

## 2014-05-06 NOTE — Telephone Encounter (Signed)
The [facility] pharmacy is calling about the Creon. It needs a prior authorization. The pharmacy has sent the information to "Cover My Meds". Do you have access with that?

## 2014-05-09 NOTE — Telephone Encounter (Signed)
A user error has taken place.

## 2014-05-10 NOTE — Telephone Encounter (Signed)
Yes, he does need the CT

## 2014-05-10 NOTE — Telephone Encounter (Signed)
Prior Lawrence Brock will be done

## 2014-05-10 NOTE — Telephone Encounter (Signed)
You have been scheduled for a CT scan of the abdomen  at [facility] ([address]---this is in the same building as [facility]).   You are scheduled on 05/16/14 at 4 pm. You should arrive 1 hour and 15 minutes prior to your appointment  (2:45 pm) time for registration. Please follow the written instructions below on the day of your exam:  WARNING: IF YOU ARE ALLERGIC TO IODINE/X-RAY DYE, PLEASE NOTIFY RADIOLOGY IMMEDIATELY AT [phone]! YOU WILL BE GIVEN A 13 HOUR PREMEDICATION PREP.  1) Do not eat or drink anything after 12 noon (4 hours prior to your test)  You may take any medications as prescribed with a small amount of water except for the following: Metformin, Glucophage, Glucovance, Avandamet, Riomet, Fortamet, Actoplus Met, Janumet, Glumetza or Metaglip. The above medications must be held the day of the exam AND 48 hours after the exam.  The purpose of you drinking the oral contrast is to aid in the visualization of your intestinal tract. The contrast solution may cause some diarrhea. Before your exam is started, you will be given a small amount of fluid to drink. Depending on your individual set of symptoms, you may also receive an intravenous injection of x-ray contrast/dye. Plan on being at [facility] for 30 minutes or long, depending on the type of exam you are having performed.  This test typically takes 30-45 minutes to complete.  Pt is aware and instructed   If you have any questions regarding your exam or if you need to reschedule, you may call the CT department at [phone] between the hours of 8:00 am and 5:00 pm, Monday-Friday.  ________________________________________________________________________   

## 2014-05-10 NOTE — Telephone Encounter (Signed)
Dr [name] does the pt still need the CT scan?  He has ERCP on 06/02/14.

## 2014-05-16 ENCOUNTER — Inpatient Hospital Stay: Admission: RE | Admit: 2014-05-16 | Payer: BLUE CROSS/BLUE SHIELD | Source: Ambulatory Visit

## 2014-05-18 ENCOUNTER — Ambulatory Visit (INDEPENDENT_AMBULATORY_CARE_PROVIDER_SITE_OTHER)
Admission: RE | Admit: 2014-05-18 | Discharge: 2014-05-18 | Disposition: A | Discharge status: Home / Self Care | Payer: BLUE CROSS/BLUE SHIELD | Source: Ambulatory Visit | Attending: Gastroenterology | Admitting: Gastroenterology

## 2014-05-18 DIAGNOSIS — K750 Abscess of liver: Secondary | ICD-10-CM

## 2014-05-18 MED ORDER — IOHEXOL 300 MG/ML  SOLN
100.0000 mL | Freq: Once | INTRAMUSCULAR | Status: AC | PRN
  Administered 2014-05-18: 100 mL via INTRAVENOUS

## 2014-05-25 ENCOUNTER — Encounter (HOSPITAL_COMMUNITY): Payer: Self-pay | Admitting: *Deleted

## 2014-05-31 ENCOUNTER — Ambulatory Visit: Payer: Self-pay | Admitting: Gastroenterology

## 2014-06-01 NOTE — Anesthesia Preprocedure Evaluation (Addendum)
Anesthesia Evaluation  Patient identified by MRN, date of birth, ID band Patient awake    Reviewed: Allergy & Precautions, NPO status   Airway Mallampati: II   Neck ROM: Full    Dental  (+) Dental Advisory Given, Teeth Intact   Pulmonary Current Smoker,  breath sounds clear to auscultation        Cardiovascular Rhythm:Regular     Neuro/Psych    GI/Hepatic PUD, (+)     substance abuse (no resent reported ETOH)  alcohol use, CBD stent, hx of pancreatitis   Endo/Other    Renal/GU      Musculoskeletal   Abdominal (+)  Abdomen: soft.    Peds  Hematology   Anesthesia Other Findings   Reproductive/Obstetrics                            Anesthesia Physical Anesthesia Plan  ASA: II  Anesthesia Plan: General   Post-op Pain Management:    Induction: Intravenous  Airway Management Planned: Oral ETT  Additional Equipment:   Intra-op Plan:   Post-operative Plan: Extubation in OR  Informed Consent: I have reviewed the patients History and Physical, chart, labs and discussed the procedure including the risks, benefits and alternatives for the proposed anesthesia with the patient or authorized representative who has indicated his/her understanding and acceptance.     Plan Discussed with:   Anesthesia Plan Comments:         Anesthesia Quick Evaluation

## 2014-06-02 ENCOUNTER — Ambulatory Visit (HOSPITAL_COMMUNITY)
Admission: RE | Admit: 2014-06-02 | Discharge: 2014-06-02 | Disposition: A | Discharge status: Home / Self Care | Payer: BLUE CROSS/BLUE SHIELD | Source: Ambulatory Visit | Attending: Gastroenterology | Admitting: Gastroenterology

## 2014-06-02 ENCOUNTER — Other Ambulatory Visit: Payer: Self-pay

## 2014-06-02 ENCOUNTER — Encounter (HOSPITAL_COMMUNITY): Payer: Self-pay

## 2014-06-02 ENCOUNTER — Ambulatory Visit (HOSPITAL_COMMUNITY): Payer: BLUE CROSS/BLUE SHIELD | Admitting: Anesthesiology

## 2014-06-02 ENCOUNTER — Ambulatory Visit (HOSPITAL_COMMUNITY): Payer: BLUE CROSS/BLUE SHIELD

## 2014-06-02 ENCOUNTER — Telehealth: Payer: Self-pay

## 2014-06-02 ENCOUNTER — Encounter (HOSPITAL_COMMUNITY)
Admission: RE | Disposition: A | Discharge status: Home / Self Care | Payer: BLUE CROSS/BLUE SHIELD | Source: Ambulatory Visit | Attending: Gastroenterology

## 2014-06-02 DIAGNOSIS — Z79899 Other long term (current) drug therapy: Secondary | ICD-10-CM | POA: Insufficient documentation

## 2014-06-02 DIAGNOSIS — F17210 Nicotine dependence, cigarettes, uncomplicated: Secondary | ICD-10-CM | POA: Insufficient documentation

## 2014-06-02 DIAGNOSIS — T85590A Other mechanical complication of bile duct prosthesis, initial encounter: Secondary | ICD-10-CM

## 2014-06-02 DIAGNOSIS — K831 Obstruction of bile duct: Secondary | ICD-10-CM | POA: Insufficient documentation

## 2014-06-02 DIAGNOSIS — Z4589 Encounter for adjustment and management of other implanted devices: Secondary | ICD-10-CM | POA: Diagnosis not present

## 2014-06-02 DIAGNOSIS — K861 Other chronic pancreatitis: Secondary | ICD-10-CM | POA: Insufficient documentation

## 2014-06-02 HISTORY — PX: ENDOSCOPIC RETROGRADE CHOLANGIOPANCREATOGRAPHY (ERCP) WITH PROPOFOL: SHX5810

## 2014-06-02 LAB — HEPATIC FUNCTION PANEL
ALBUMIN: 3.8 g/dL (ref 3.5–5.2)
ALT: 42 U/L (ref 0–53)
AST: 31 U/L (ref 0–37)
Alkaline Phosphatase: 55 U/L (ref 39–117)
BILIRUBIN DIRECT: 0.2 mg/dL (ref 0.0–0.5)
Indirect Bilirubin: 1.5 mg/dL — ABNORMAL HIGH (ref 0.3–0.9)
Total Bilirubin: 1.7 mg/dL — ABNORMAL HIGH (ref 0.3–1.2)
Total Protein: 6.7 g/dL (ref 6.0–8.3)

## 2014-06-02 SURGERY — ENDOSCOPIC RETROGRADE CHOLANGIOPANCREATOGRAPHY (ERCP) WITH PROPOFOL
Anesthesia: General

## 2014-06-02 MED ORDER — ONDANSETRON HCL 4 MG/2ML IJ SOLN
INTRAMUSCULAR | Status: AC
  Filled 2014-06-02: qty 2

## 2014-06-02 MED ORDER — FENTANYL CITRATE 0.05 MG/ML IJ SOLN: 25.0000 ug | INTRAMUSCULAR | Status: DC | PRN

## 2014-06-02 MED ORDER — PROMETHAZINE HCL 25 MG/ML IJ SOLN: 6.2500 mg | INTRAMUSCULAR | Status: DC | PRN

## 2014-06-02 MED ORDER — IOHEXOL 300 MG/ML  SOLN
INTRAMUSCULAR | Status: DC | PRN
  Administered 2014-06-02: 35 mL

## 2014-06-02 MED ORDER — SODIUM CHLORIDE 0.9 % IJ SOLN
INTRAMUSCULAR | Status: AC
  Filled 2014-06-02: qty 10

## 2014-06-02 MED ORDER — FENTANYL CITRATE 0.05 MG/ML IJ SOLN
INTRAMUSCULAR | Status: AC
  Filled 2014-06-02: qty 2

## 2014-06-02 MED ORDER — LIDOCAINE HCL (CARDIAC) 20 MG/ML IV SOLN
INTRAVENOUS | Status: DC | PRN
  Administered 2014-06-02: 100 mg via INTRAVENOUS

## 2014-06-02 MED ORDER — MIDAZOLAM HCL 2 MG/2ML IJ SOLN
INTRAMUSCULAR | Status: AC
  Filled 2014-06-02: qty 2

## 2014-06-02 MED ORDER — SODIUM CHLORIDE 0.9 % IV SOLN: INTRAVENOUS | Status: DC

## 2014-06-02 MED ORDER — CIPROFLOXACIN IN D5W 400 MG/200ML IV SOLN
400.0000 mg | INTRAVENOUS | Status: AC
  Administered 2014-06-02: 400 mg via INTRAVENOUS

## 2014-06-02 MED ORDER — MEPERIDINE HCL 100 MG/ML IJ SOLN: 6.2500 mg | INTRAMUSCULAR | Status: DC | PRN

## 2014-06-02 MED ORDER — MIDAZOLAM HCL 5 MG/5ML IJ SOLN
INTRAMUSCULAR | Status: DC | PRN
  Administered 2014-06-02: 2 mg via INTRAVENOUS

## 2014-06-02 MED ORDER — CIPROFLOXACIN IN D5W 400 MG/200ML IV SOLN
INTRAVENOUS | Status: AC
  Filled 2014-06-02: qty 200

## 2014-06-02 MED ORDER — PROPOFOL 10 MG/ML IV BOLUS
INTRAVENOUS | Status: AC
  Filled 2014-06-02: qty 20

## 2014-06-02 MED ORDER — EPHEDRINE SULFATE 50 MG/ML IJ SOLN
INTRAMUSCULAR | Status: AC
  Filled 2014-06-02: qty 1

## 2014-06-02 MED ORDER — INDOMETHACIN 50 MG RE SUPP
100.0000 mg | Freq: Once | RECTAL | Status: AC
  Administered 2014-06-02: 100 mg via RECTAL
  Filled 2014-06-02: qty 2

## 2014-06-02 MED ORDER — SUCCINYLCHOLINE CHLORIDE 20 MG/ML IJ SOLN
INTRAMUSCULAR | Status: DC | PRN
  Administered 2014-06-02: 100 mg via INTRAVENOUS

## 2014-06-02 MED ORDER — LACTATED RINGERS IV SOLN
INTRAVENOUS | Status: DC
  Administered 2014-06-02: 1000 mL via INTRAVENOUS

## 2014-06-02 MED ORDER — PROPOFOL 10 MG/ML IV BOLUS
INTRAVENOUS | Status: DC | PRN
  Administered 2014-06-02: 150 mg via INTRAVENOUS

## 2014-06-02 MED ORDER — FENTANYL CITRATE 0.05 MG/ML IJ SOLN
INTRAMUSCULAR | Status: DC | PRN
  Administered 2014-06-02 (×2): 50 ug via INTRAVENOUS

## 2014-06-02 MED ORDER — LIDOCAINE HCL (CARDIAC) 20 MG/ML IV SOLN
INTRAVENOUS | Status: AC
  Filled 2014-06-02: qty 5

## 2014-06-02 MED ORDER — ONDANSETRON HCL 4 MG/2ML IJ SOLN
INTRAMUSCULAR | Status: DC | PRN
  Administered 2014-06-02: 4 mg via INTRAVENOUS

## 2014-06-02 NOTE — Anesthesia Procedure Notes (Signed)
Procedure Name: Intubation Date/Time: 06/02/2014 7:40 AM Performed by: Maxwell Caul Pre-anesthesia Checklist: Patient identified, Emergency Drugs available, Suction available and Patient being monitored Patient Re-evaluated:Patient Re-evaluated prior to inductionOxygen Delivery Method: Circle System Utilized Preoxygenation: Pre-oxygenation with 100% oxygen Intubation Type: IV induction Ventilation: Mask ventilation without difficulty Grade View: Grade I Tube type: Oral Tube size: 7.5 mm Number of attempts: 1 Airway Equipment and Method: Stylet and Oral airway Placement Confirmation: ETT inserted through vocal cords under direct vision,  positive ETCO2 and breath sounds checked- equal and bilateral Secured at: 21 cm Tube secured with: Tape Dental Injury: Teeth and Oropharynx as per pre-operative assessment

## 2014-06-02 NOTE — H&P (Signed)
  HPI: This is a with complex pancreatic biliary disease  Here for removal of previously placed stent, evaluation    Past Medical History  Diagnosis Date  . Pancreatitis   . Chronic back pain   . Chronic pancreatitis 09/10/2013    Past Surgical History  Procedure Laterality Date  . Appendectomy    . Esophagogastroduodenoscopy (egd) with propofol N/A 09/14/2013    Procedure: ESOPHAGOGASTRODUODENOSCOPY (EGD) WITH PROPOFOL;  Surgeon: Jerene Bears, MD;  Location: [facility];  Service: Gastroenterology;  Laterality: N/A;  . Gastrointestinal stent removal N/A 09/14/2013    Procedure: GASTROINTESTINAL STENT REMOVAL;  Surgeon: Jerene Bears, MD;  Location: [facility];  Service: Gastroenterology;  Laterality: N/A;  pancreatic stent removal  . Ercp N/A 02/22/2014    Procedure: ENDOSCOPIC RETROGRADE CHOLANGIOPANCREATOGRAPHY (ERCP);  Surgeon: Ladene Artist, MD;  Location: [facility];  Service: Endoscopy;  Laterality: N/A;  . Ercp N/A 02/18/2014    Procedure: ENDOSCOPIC RETROGRADE CHOLANGIOPANCREATOGRAPHY (ERCP);  Surgeon: Inda Castle, MD;  Location: [facility];  Service: Gastroenterology;  Laterality: N/A;    Current Facility-Administered Medications  Medication Dose Route Frequency Provider Last Rate Last Dose  . 0.9 %  sodium chloride infusion   Intravenous Continuous Milus Banister, MD      . ciprofloxacin (CIPRO) IVPB 400 mg  400 mg Intravenous On Call to [place], CRNA      . fentaNYL (SUBLIMAZE) injection 25-50 mcg  25-50 mcg Intravenous Q5 min PRN Alexis Frock, MD      . lactated ringers infusion   Intravenous Continuous Milus Banister, MD 125 mL/hr at 06/02/14 0720 1,000 mL at 06/02/14 0720  . meperidine (DEMEROL) injection 6.25-12.5 mg  6.25-12.5 mg Intravenous Q5 min PRN Alexis Frock, MD      . promethazine (PHENERGAN) injection 6.25-12.5 mg  6.25-12.5 mg Intravenous Q15 min PRN Alexis Frock, MD        Allergies as of 04/18/2014  . (No Known Allergies)     History reviewed. No pertinent family history.  History   Social History  . Marital Status: Married    Spouse Name: N/A  . Number of Children: 4  . Years of Education: N/A   Occupational History  . Chemical Operator    Social History Main Topics  . Smoking status: Current Every Day Smoker -- 0.50 packs/day    Types: Cigarettes  . Smokeless tobacco: Never Used  . Alcohol Use: No     Comment: former  . Drug Use: No  . Sexual Activity: Yes   Other Topics Concern  . Not on file   Social History Narrative      Physical Exam: BP 132/106 mmHg  Pulse 70  Temp(Src) 97.9 F (36.6 C) (Oral)  Resp 17  Ht 5\' 8"  (1.727 m)  Wt 145 lb (65.772 kg)  BMI 22.05 kg/m2  SpO2 100% Constitutional: generally well-appearing Psychiatric: alert and oriented x3 Abdomen: soft, nontender, nondistended, no obvious ascites, no peritoneal signs, normal bowel sounds     Assessment and plan: 44 y.o. male with chronic pancreatitis, h/o liver abscess, CBD stricture  Removal of previous bile duct stent, evaluation of stricture.

## 2014-06-02 NOTE — Op Note (Signed)
[facility] [place], [id number]   ERCP PROCEDURE REPORT  PATIENT: Lawrence Brock, Lawrence Brock  MR#: [id number] BIRTHDATE: [birth year]  GENDER: male  ENDOSCOPIST: Milus Banister, MD  PROCEDURE DATE:  06/02/2014 PROCEDURE:   ERCP with foreign body removal INDICATIONS:Chronic pancreatitis with biliary stricture, resulted in cholangitis with liver abscess 02/2014; initial biliary stent did not work, second biliary stent (fully covered SEMS) resulted in resolution of cholangitis, normalization of LFTs, and (over 6-8 weeks) resolution of liver abscesses on about 2 month of oral antibiotics.  Evaluation at [facility], Dr.  Eugenia Pancoast and we agreed to remove current stent today and observe him clinically, will replace stent today if obvious stricture remains. He has been OFF antibiotics for about 3 weeks now.  MEDICATIONS: Per Anesthesia and cipro 400mg  IV TOPICAL ANESTHETIC:   none  DESCRIPTION OF PROCEDURE:     Physical exam was performed.  Informed consent was obtained from the patient after explaining the benefits, risks, and alternatives to the procedure.  The patient was connected to the monitor and placed in the semi-prone position. IV medicine was administered through an indwelling cannula and oxygen via endotracheal tube.  After administration of sedation, the patients esophagus was intubated and the Pentax Ercp Scope [phone]  endoscope was advanced under direct visualization to the second portion of the duodenum without detailed evaluation of the UGI tract. The previously placed SEMS was in good position extending about 6-92mm into the duodenum across the major papilla. The stent was removed with forceps and then the bile duct was cannulated with a 44 Autotome over a .035 hydrawire.  Dye was injected and cholangiogram showed non-dilated intrahepatics, cystic duct and GB partially filled with dye, the CBD was NOT dilated or strictured but did have somewhat  'ragged' appearing walls (I suspect from stent induced inflammation).  I swept the bile duct 4 times with a partially inflated biliary sweeping balloon. No stones were delivered into the duodenum and the ballon never caught or hung up during sweeping, also suggested lack of stricture.  The scope was then completely withdrawn from the patient and the procedure terminated.  COMPLICATIONS:    None  ENDOSCOPIC IMPRESSION: Previous biliary covered SEMS was removed. CBD after stent removal was found to NOT be strictured or dilated.  RECOMMENDATIONS: I will forward these findings to Dr. Eugenia Pancoast and plan to follow Mr. Loveday LFTs serially (once a week for at least a few week) as we had discussed previously.  If the LFTs clearly trend up, he will likely need biliary diversion surgery.  If he becomes cholangitic again, then covered SEMS will be replaced and then he will need biliary diversion.    _______________________________ eSignedMilus Banister, MD 06/02/2014 8:32 AM

## 2014-06-02 NOTE — Anesthesia Postprocedure Evaluation (Signed)
  Anesthesia Post-op Note  Patient: Lawrence Brock  Procedure(s) Performed: Procedure(s): ENDOSCOPIC RETROGRADE CHOLANGIOPANCREATOGRAPHY (ERCP) WITH PROPOFOL (N/A)  Patient Location: PACU  Anesthesia Type:General  Level of Consciousness: awake, alert  and oriented  Airway and Oxygen Therapy: Patient Spontanous Breathing and Patient connected to nasal cannula oxygen  Post-op Pain: none  Post-op Assessment: Post-op Vital signs reviewed, Patient's Cardiovascular Status Stable, Respiratory Function Stable, Patent Airway and No signs of Nausea or vomiting  Post-op Vital Signs: Reviewed and stable  Last Vitals:  Filed Vitals:   06/02/14 0827  BP:   Pulse: 71  Temp: 36.6 C  Resp: 13    Complications: No apparent anesthesia complications

## 2014-06-02 NOTE — Telephone Encounter (Signed)
-----   Message from Milus Banister, MD sent at 06/02/2014  8:43 AM EST ----- Lastly, (sorry so many notes)  He said he has not heard about creon coverage, pre-approval. Can you check on that and contact him later today.  [email or web address]

## 2014-06-02 NOTE — Transfer of Care (Signed)
Immediate Anesthesia Transfer of Care Note  Patient: Lawrence Brock  Procedure(s) Performed: Procedure(s) (LRB): ENDOSCOPIC RETROGRADE CHOLANGIOPANCREATOGRAPHY (ERCP) WITH PROPOFOL (N/A)  Patient Location: PACU  Anesthesia Type: General  Level of Consciousness: sedated, patient cooperative and responds to stimulation  Airway & Oxygen Therapy: Patient Spontanous Breathing and Patient connected to face mask oxgen  Post-op Assessment: Report given to PACU RN and Post -op Vital signs reviewed and stable  Post vital signs: Reviewed and stable  Complications: No apparent anesthesia complications

## 2014-06-02 NOTE — Discharge Instructions (Signed)

## 2014-06-03 ENCOUNTER — Encounter (HOSPITAL_COMMUNITY): Payer: Self-pay | Admitting: Gastroenterology

## 2014-06-07 NOTE — Telephone Encounter (Signed)
Insurance has denied coverage for creon.  Please advise

## 2014-06-08 NOTE — Telephone Encounter (Signed)
Patty, can you check out his coverage for other pancreatic enzyme supplements and let me know if any are more reasonable for his insurance.  thanks

## 2014-06-09 ENCOUNTER — Telehealth: Payer: Self-pay

## 2014-06-09 MED ORDER — PANCRELIPASE (LIP-PROT-AMYL) 36000-114000 UNITS PO CPEP: 72000.0000 [IU] | ORAL_CAPSULE | Freq: Three times a day (TID) | ORAL | Status: DC

## 2014-06-09 NOTE — Telephone Encounter (Signed)
Pancrelipase is covered, it is the generic form of Creon.  I will send and see if the pt can pick this up.

## 2014-06-09 NOTE — Telephone Encounter (Signed)
The pt will find out which pancreatic enzyme is covered and call back so we can send that rx to the pharmacy

## 2014-06-10 ENCOUNTER — Other Ambulatory Visit (INDEPENDENT_AMBULATORY_CARE_PROVIDER_SITE_OTHER): Payer: BLUE CROSS/BLUE SHIELD

## 2014-06-10 DIAGNOSIS — K861 Other chronic pancreatitis: Secondary | ICD-10-CM

## 2014-06-10 LAB — CBC WITH DIFFERENTIAL/PLATELET
Basophils Absolute: 0 10*3/uL (ref 0.0–0.1)
Basophils Relative: 0.2 % (ref 0.0–3.0)
EOS ABS: 0.2 10*3/uL (ref 0.0–0.7)
Eosinophils Relative: 3.5 % (ref 0.0–5.0)
HCT: 38.3 % — ABNORMAL LOW (ref 39.0–52.0)
HEMOGLOBIN: 13.2 g/dL (ref 13.0–17.0)
LYMPHS ABS: 0.4 10*3/uL — AB (ref 0.7–4.0)
LYMPHS PCT: 9.6 % — AB (ref 12.0–46.0)
MCHC: 34.4 g/dL (ref 30.0–36.0)
MCV: 83.3 fl (ref 78.0–100.0)
MONO ABS: 0.2 10*3/uL (ref 0.1–1.0)
Monocytes Relative: 4.6 % (ref 3.0–12.0)
NEUTROS ABS: 3.8 10*3/uL (ref 1.4–7.7)
Neutrophils Relative %: 82.1 % — ABNORMAL HIGH (ref 43.0–77.0)
Platelets: 49 10*3/uL — CL (ref 150.0–400.0)
RBC: 4.59 Mil/uL (ref 4.22–5.81)
RDW: 15.7 % — AB (ref 11.5–15.5)
WBC: 4.6 10*3/uL (ref 4.0–10.5)

## 2014-06-10 LAB — COMPREHENSIVE METABOLIC PANEL
ALT: 30 U/L (ref 0–53)
AST: 28 U/L (ref 0–37)
Albumin: 3.8 g/dL (ref 3.5–5.2)
Alkaline Phosphatase: 58 U/L (ref 39–117)
BILIRUBIN TOTAL: 1.6 mg/dL — AB (ref 0.2–1.2)
BUN: 16 mg/dL (ref 6–23)
CO2: 28 mEq/L (ref 19–32)
Calcium: 8.8 mg/dL (ref 8.4–10.5)
Chloride: 107 mEq/L (ref 96–112)
Creatinine, Ser: 0.88 mg/dL (ref 0.40–1.50)
GFR: 100.52 mL/min (ref >60.00)
Glucose, Bld: 129 mg/dL — ABNORMAL HIGH (ref 70–99)
Potassium: 4.2 mEq/L (ref 3.5–5.1)
Sodium: 138 mEq/L (ref 135–145)
Total Protein: 6.8 g/dL (ref 6.0–8.3)

## 2014-06-15 ENCOUNTER — Other Ambulatory Visit: Payer: Self-pay

## 2014-06-15 DIAGNOSIS — K861 Other chronic pancreatitis: Secondary | ICD-10-CM

## 2014-06-16 ENCOUNTER — Other Ambulatory Visit (INDEPENDENT_AMBULATORY_CARE_PROVIDER_SITE_OTHER): Payer: BLUE CROSS/BLUE SHIELD

## 2014-06-16 DIAGNOSIS — K861 Other chronic pancreatitis: Secondary | ICD-10-CM | POA: Diagnosis not present

## 2014-06-16 LAB — HEPATIC FUNCTION PANEL
ALK PHOS: 57 U/L (ref 39–117)
ALT: 28 U/L (ref 0–53)
AST: 24 U/L (ref 0–37)
Albumin: 4.1 g/dL (ref 3.5–5.2)
BILIRUBIN TOTAL: 1.9 mg/dL — AB (ref 0.2–1.2)
Bilirubin, Direct: 0.4 mg/dL — ABNORMAL HIGH (ref 0.0–0.3)
Total Protein: 7.5 g/dL (ref 6.0–8.3)

## 2014-06-22 ENCOUNTER — Other Ambulatory Visit: Payer: Self-pay

## 2014-06-22 DIAGNOSIS — K861 Other chronic pancreatitis: Secondary | ICD-10-CM

## 2014-06-24 ENCOUNTER — Other Ambulatory Visit (INDEPENDENT_AMBULATORY_CARE_PROVIDER_SITE_OTHER): Payer: BLUE CROSS/BLUE SHIELD

## 2014-06-24 DIAGNOSIS — K861 Other chronic pancreatitis: Secondary | ICD-10-CM | POA: Diagnosis not present

## 2014-06-24 DIAGNOSIS — K858 Other acute pancreatitis without necrosis or infection: Secondary | ICD-10-CM

## 2014-06-24 LAB — CBC WITH DIFFERENTIAL/PLATELET
BASOS ABS: 0 10*3/uL (ref 0.0–0.1)
Basophils Relative: 0.1 % (ref 0.0–3.0)
Eosinophils Absolute: 0.1 10*3/uL (ref 0.0–0.7)
Eosinophils Relative: 2.3 % (ref 0.0–5.0)
HCT: 38.9 % — ABNORMAL LOW (ref 39.0–52.0)
Hemoglobin: 13.4 g/dL (ref 13.0–17.0)
LYMPHS PCT: 10.6 % — AB (ref 12.0–46.0)
Lymphs Abs: 0.5 10*3/uL — ABNORMAL LOW (ref 0.7–4.0)
MCHC: 34.3 g/dL (ref 30.0–36.0)
MCV: 83.7 fl (ref 78.0–100.0)
MONOS PCT: 4.2 % (ref 3.0–12.0)
Monocytes Absolute: 0.2 10*3/uL (ref 0.1–1.0)
NEUTROS PCT: 82.8 % — AB (ref 43.0–77.0)
Neutro Abs: 4.2 10*3/uL (ref 1.4–7.7)
RBC: 4.65 Mil/uL (ref 4.22–5.81)
RDW: 16.3 % — AB (ref 11.5–15.5)
WBC: 5.1 10*3/uL (ref 4.0–10.5)

## 2014-06-24 LAB — COMPREHENSIVE METABOLIC PANEL
ALK PHOS: 59 U/L (ref 39–117)
ALT: 28 U/L (ref 0–53)
AST: 24 U/L (ref 0–37)
Albumin: 4.1 g/dL (ref 3.5–5.2)
BUN: 17 mg/dL (ref 6–23)
CO2: 28 meq/L (ref 19–32)
Calcium: 9.1 mg/dL (ref 8.4–10.5)
Chloride: 108 mEq/L (ref 96–112)
Creatinine, Ser: 0.89 mg/dL (ref 0.40–1.50)
GFR: 99.2 mL/min (ref >60.00)
GLUCOSE: 137 mg/dL — AB (ref 70–99)
POTASSIUM: 4 meq/L (ref 3.5–5.1)
Sodium: 138 mEq/L (ref 135–145)
TOTAL PROTEIN: 7.2 g/dL (ref 6.0–8.3)
Total Bilirubin: 2.7 mg/dL — ABNORMAL HIGH (ref 0.2–1.2)

## 2014-06-24 LAB — HEPATIC FUNCTION PANEL
ALK PHOS: 58 U/L (ref 39–117)
ALT: 27 U/L (ref 0–53)
AST: 25 U/L (ref 0–37)
Albumin: 4.1 g/dL (ref 3.5–5.2)
BILIRUBIN TOTAL: 2.6 mg/dL — AB (ref 0.2–1.2)
Bilirubin, Direct: 0.5 mg/dL — ABNORMAL HIGH (ref 0.0–0.3)
Total Protein: 7.1 g/dL (ref 6.0–8.3)

## 2014-06-29 ENCOUNTER — Telehealth: Payer: Self-pay

## 2014-06-29 ENCOUNTER — Other Ambulatory Visit: Payer: Self-pay | Admitting: *Deleted

## 2014-06-29 MED ORDER — CIPROFLOXACIN HCL 500 MG PO TABS: 500.0000 mg | ORAL_TABLET | Freq: Two times a day (BID) | ORAL | Status: DC

## 2014-06-29 MED ORDER — METRONIDAZOLE 250 MG PO TABS: 250.0000 mg | ORAL_TABLET | Freq: Three times a day (TID) | ORAL | Status: DC

## 2014-06-29 NOTE — Progress Notes (Signed)
error 

## 2014-06-29 NOTE — Telephone Encounter (Signed)
Pt has been called to inform him that meds have been sent and he can go to [facility] and pick up disc of images to take to his appt at [facility] on 06/30/14.  Left message on machine to call back

## 2014-06-29 NOTE — Telephone Encounter (Signed)
Pt has been notified and will pick up meds and disc.

## 2014-06-29 NOTE — Telephone Encounter (Signed)
  Can you please call him. I spoke with Dr. Birdie Sons at [facility] and Dr. Gabriel Rainwater office will be getting in touch with him about an expedited visit there in the next week or 2. His liver tests are obviously rising again and I would like him to restart antibiotics. Please call him in ciprofloxacin 500 mg pill one pill twice daily dispense 60 with one refill. Also Flagyl 250 mg pill, take 1 pill 3 times daily, dispense 90 with one refill. He should start those antibiotics today or tomorrow and continue at least until he sees Dr. Eugenia Pancoast. We will not be proceeding with repeat ERCP stenting at this point, Dr. Eugenia Pancoast prefers his bile duct to stay a bit dilated prior to iliac artery surgery. He may call back to ask for ERCP, stenting done very immediately before a surgery such as that.  If you can please also coordinate with Dr. Gabriel Rainwater office sending these recent liver tests, also coordinate sending disc, CD images of his recent ERCPs, recent CT scans. Thank you

## 2014-07-11 ENCOUNTER — Telehealth: Payer: Self-pay | Admitting: Gastroenterology

## 2014-07-11 NOTE — Telephone Encounter (Signed)
The pt has questions about his [facility] appt and was advised to call [facility] for the correct information.  Pt agreed and will call back with any further concerns

## 2014-07-11 NOTE — Telephone Encounter (Signed)
Left message on machine to call back  

## 2014-07-12 ENCOUNTER — Telehealth: Payer: Self-pay | Admitting: Gastroenterology

## 2014-07-12 DIAGNOSIS — R7989 Other specified abnormal findings of blood chemistry: Secondary | ICD-10-CM

## 2014-07-12 DIAGNOSIS — R945 Abnormal results of liver function studies: Principal | ICD-10-CM

## 2014-07-12 NOTE — Telephone Encounter (Signed)
Dr. Eugenia Pancoast called me today. He was seeing the patient in his office today. He knows that his bilirubin was slightly elevated but his alkaline phosphatase remained completely normal. He and I discussed that this is a very complex patient. Currently still on antibiotics. We agreed that he should come off of his antibiotics for now and he will get repeat liver tests monthly. Dr. Eugenia Pancoast gave him a prescription for pain chronic enzyme supplements that he should be able to afford.  Patty, will you please set him up for repeat LFTs in 1 month from today. He will have monthly LFTs following that and I will forward these results to Dr. Eugenia Pancoast. Dr. Eugenia Pancoast was planning to see him back in his office in about 3 months.

## 2014-07-14 NOTE — Telephone Encounter (Signed)
Pt aware he will need monthly LFT and will be in in 1 month to begin.  I have added the order to EPIC.

## 2014-07-26 ENCOUNTER — Telehealth: Payer: Self-pay | Admitting: Gastroenterology

## 2014-07-26 NOTE — Telephone Encounter (Signed)
Received records from [place] sent to Dr. Owens Loffler 07/26/14 fbg.

## 2014-08-26 ENCOUNTER — Other Ambulatory Visit (INDEPENDENT_AMBULATORY_CARE_PROVIDER_SITE_OTHER): Payer: BLUE CROSS/BLUE SHIELD

## 2014-08-26 DIAGNOSIS — R7989 Other specified abnormal findings of blood chemistry: Secondary | ICD-10-CM | POA: Diagnosis not present

## 2014-08-26 DIAGNOSIS — R945 Abnormal results of liver function studies: Principal | ICD-10-CM

## 2014-08-26 LAB — HEPATIC FUNCTION PANEL
ALT: 25 U/L (ref 0–53)
AST: 21 U/L (ref 0–37)
Albumin: 3.6 g/dL (ref 3.5–5.2)
Alkaline Phosphatase: 53 U/L (ref 39–117)
BILIRUBIN DIRECT: 0.2 mg/dL (ref 0.0–0.3)
Total Bilirubin: 0.8 mg/dL (ref 0.2–1.2)
Total Protein: 6.5 g/dL (ref 6.0–8.3)

## 2014-09-24 ENCOUNTER — Encounter (HOSPITAL_BASED_OUTPATIENT_CLINIC_OR_DEPARTMENT_OTHER): Payer: Self-pay | Admitting: *Deleted

## 2014-09-24 ENCOUNTER — Emergency Department (HOSPITAL_BASED_OUTPATIENT_CLINIC_OR_DEPARTMENT_OTHER)
Admission: EM | Admit: 2014-09-24 | Discharge: 2014-09-25 | Disposition: A | Discharge status: Home / Self Care | Payer: BLUE CROSS/BLUE SHIELD | Attending: Emergency Medicine | Admitting: Emergency Medicine

## 2014-09-24 DIAGNOSIS — Z8719 Personal history of other diseases of the digestive system: Secondary | ICD-10-CM | POA: Insufficient documentation

## 2014-09-24 DIAGNOSIS — Z9889 Other specified postprocedural states: Secondary | ICD-10-CM | POA: Diagnosis not present

## 2014-09-24 DIAGNOSIS — G8929 Other chronic pain: Secondary | ICD-10-CM | POA: Diagnosis not present

## 2014-09-24 DIAGNOSIS — R1111 Vomiting without nausea: Secondary | ICD-10-CM | POA: Insufficient documentation

## 2014-09-24 DIAGNOSIS — Z79899 Other long term (current) drug therapy: Secondary | ICD-10-CM | POA: Diagnosis not present

## 2014-09-24 DIAGNOSIS — Z9049 Acquired absence of other specified parts of digestive tract: Secondary | ICD-10-CM | POA: Insufficient documentation

## 2014-09-24 DIAGNOSIS — Z792 Long term (current) use of antibiotics: Secondary | ICD-10-CM | POA: Diagnosis not present

## 2014-09-24 DIAGNOSIS — Z720 Tobacco use: Secondary | ICD-10-CM | POA: Diagnosis not present

## 2014-09-24 DIAGNOSIS — F1010 Alcohol abuse, uncomplicated: Secondary | ICD-10-CM | POA: Diagnosis not present

## 2014-09-24 DIAGNOSIS — R1084 Generalized abdominal pain: Secondary | ICD-10-CM | POA: Insufficient documentation

## 2014-09-24 DIAGNOSIS — R197 Diarrhea, unspecified: Secondary | ICD-10-CM | POA: Insufficient documentation

## 2014-09-24 DIAGNOSIS — R509 Fever, unspecified: Secondary | ICD-10-CM | POA: Diagnosis present

## 2014-09-24 LAB — CBC WITH DIFFERENTIAL/PLATELET
BASOS PCT: 0 % (ref 0–1)
Basophils Absolute: 0 10*3/uL (ref 0.0–0.1)
EOS ABS: 0.1 10*3/uL (ref 0.0–0.7)
Eosinophils Relative: 2 % (ref 0–5)
HEMATOCRIT: 26.4 % — AB (ref 39.0–52.0)
Hemoglobin: 8.5 g/dL — ABNORMAL LOW (ref 13.0–17.0)
Lymphocytes Relative: 10 % — ABNORMAL LOW (ref 12–46)
Lymphs Abs: 0.5 10*3/uL — ABNORMAL LOW (ref 0.7–4.0)
MCH: 27.5 pg (ref 26.0–34.0)
MCHC: 32.2 g/dL (ref 30.0–36.0)
MCV: 85.4 fL (ref 78.0–100.0)
Monocytes Absolute: 0.3 10*3/uL (ref 0.1–1.0)
Monocytes Relative: 6 % (ref 3–12)
Neutro Abs: 3.9 10*3/uL (ref 1.7–7.7)
Neutrophils Relative %: 81 % — ABNORMAL HIGH (ref 43–77)
Platelets: 52 10*3/uL — ABNORMAL LOW (ref 150–400)
RBC: 3.09 MIL/uL — ABNORMAL LOW (ref 4.22–5.81)
RDW: 14.2 % (ref 11.5–15.5)
WBC: 4.7 10*3/uL (ref 4.0–10.5)

## 2014-09-24 LAB — COMPREHENSIVE METABOLIC PANEL
ALBUMIN: 3.6 g/dL (ref 3.5–5.0)
ALK PHOS: 57 U/L (ref 38–126)
ALT: 19 U/L (ref 17–63)
ANION GAP: 8 (ref 5–15)
AST: 22 U/L (ref 15–41)
BILIRUBIN TOTAL: 1.6 mg/dL — AB (ref 0.3–1.2)
BUN: 13 mg/dL (ref 6–20)
CO2: 25 mmol/L (ref 22–32)
Calcium: 8.7 mg/dL — ABNORMAL LOW (ref 8.9–10.3)
Chloride: 104 mmol/L (ref 101–111)
Creatinine, Ser: 1.01 mg/dL (ref 0.61–1.24)
GFR calc non Af Amer: >60 mL/min (ref >60)
Glucose, Bld: 163 mg/dL — ABNORMAL HIGH (ref 65–99)
Potassium: 3.4 mmol/L — ABNORMAL LOW (ref 3.5–5.1)
Sodium: 137 mmol/L (ref 135–145)
Total Protein: 7.2 g/dL (ref 6.5–8.1)

## 2014-09-24 LAB — PROTIME-INR
INR: 1.16 (ref 0.00–1.49)
Prothrombin Time: 15 seconds (ref 11.6–15.2)

## 2014-09-24 LAB — LIPASE, BLOOD: LIPASE: 11 U/L — AB (ref 22–51)

## 2014-09-24 LAB — OCCULT BLOOD X 1 CARD TO LAB, STOOL: Fecal Occult Bld: NEGATIVE

## 2014-09-24 LAB — APTT: aPTT: 36 seconds (ref 24–37)

## 2014-09-24 MED ORDER — SODIUM CHLORIDE 0.9 % IV BOLUS (SEPSIS)
1000.0000 mL | Freq: Once | INTRAVENOUS | Status: AC
  Administered 2014-09-24: 1000 mL via INTRAVENOUS

## 2014-09-24 MED ORDER — ONDANSETRON 4 MG PO TBDP: 4.0000 mg | ORAL_TABLET | Freq: Three times a day (TID) | ORAL | Status: DC | PRN

## 2014-09-24 MED ORDER — OXYCODONE HCL 5 MG PO CAPS: 5.0000 mg | ORAL_CAPSULE | ORAL | Status: DC | PRN

## 2014-09-24 MED ORDER — ONDANSETRON HCL 4 MG/2ML IJ SOLN
4.0000 mg | Freq: Once | INTRAMUSCULAR | Status: AC
  Administered 2014-09-24: 4 mg via INTRAVENOUS
  Filled 2014-09-24: qty 2

## 2014-09-24 MED ORDER — HYDROMORPHONE HCL 1 MG/ML IJ SOLN
1.0000 mg | Freq: Once | INTRAMUSCULAR | Status: AC
  Administered 2014-09-24: 1 mg via INTRAVENOUS
  Filled 2014-09-24: qty 1

## 2014-09-24 NOTE — Discharge Instructions (Signed)

## 2014-09-24 NOTE — ED Provider Notes (Signed)
CSN: [id number]     Arrival date & time 09/24/14  1839 History   First MD Initiated Contact with Patient 09/24/14 1911     Chief Complaint  Patient presents with  . Fever     (Consider location/radiation/quality/duration/timing/severity/associated sxs/prior Treatment) Patient is a 44 y.o. male presenting with abdominal pain. The history is provided by the patient. No language interpreter was used.  Abdominal Pain Pain location:  Generalized Pain quality: aching   Pain quality: not sharp and no stiffness   Pain radiates to:  Does not radiate Pain severity:  Moderate Onset quality:  Gradual Duration:  3 days Timing:  Constant Progression:  Worsening Chronicity:  New Context: alcohol use   Relieved by:  Nothing Associated symptoms: diarrhea and fever   Risk factors: alcohol abuse   Pt has a history of pancreatitis and ERCP most recently in 3/16.  Pt has been seen in [place] at [facility] for further evaluation.  Pt reports recently he is having abdominal pain, blood in bowel movements and nose bleeds.  Pt is out of pain medications  Past Medical History  Diagnosis Date  . Pancreatitis   . Chronic back pain   . Chronic pancreatitis 09/10/2013   Past Surgical History  Procedure Laterality Date  . Appendectomy    . Esophagogastroduodenoscopy (egd) with propofol N/A 09/14/2013    Procedure: ESOPHAGOGASTRODUODENOSCOPY (EGD) WITH PROPOFOL;  Surgeon: Jerene Bears, MD;  Location: [facility];  Service: Gastroenterology;  Laterality: N/A;  . Gastrointestinal stent removal N/A 09/14/2013    Procedure: GASTROINTESTINAL STENT REMOVAL;  Surgeon: Jerene Bears, MD;  Location: [facility];  Service: Gastroenterology;  Laterality: N/A;  pancreatic stent removal  . Ercp N/A 02/22/2014    Procedure: ENDOSCOPIC RETROGRADE CHOLANGIOPANCREATOGRAPHY (ERCP);  Surgeon: Ladene Artist, MD;  Location: [facility];  Service: Endoscopy;  Laterality: N/A;  . Ercp N/A 02/18/2014    Procedure: ENDOSCOPIC  RETROGRADE CHOLANGIOPANCREATOGRAPHY (ERCP);  Surgeon: Inda Castle, MD;  Location: [facility];  Service: Gastroenterology;  Laterality: N/A;  . Endoscopic retrograde cholangiopancreatography (ercp) with propofol N/A 06/02/2014    Procedure: ENDOSCOPIC RETROGRADE CHOLANGIOPANCREATOGRAPHY (ERCP) WITH PROPOFOL;  Surgeon: Milus Banister, MD;  Location: [facility];  Service: Endoscopy;  Laterality: N/A;   History reviewed. No pertinent family history. History  Substance Use Topics  . Smoking status: Current Every Day Smoker -- 0.50 packs/day    Types: Cigarettes  . Smokeless tobacco: Never Used  . Alcohol Use: No     Comment: former    Review of Systems  Constitutional: Positive for fever.  Gastrointestinal: Positive for abdominal pain and diarrhea.      Allergies  Review of patient's allergies indicates no known allergies.  Home Medications   Prior to Admission medications   Medication Sig Start Date End Date Taking? Authorizing Provider  amoxicillin-clavulanate (AUGMENTIN) 875-125 MG per tablet Take 1 tablet by mouth 2 (two) times daily.   Yes Historical Provider, MD  HYDROcodone-ibuprofen (VICOPROFEN) 7.5-200 MG per tablet Take 1 tablet by mouth every 8 (eight) hours as needed for moderate pain.   Yes Historical Provider, MD  lipase/protease/amylase (CREON) 12000 UNITS CPEP capsule Take by mouth.   Yes Historical Provider, MD  oxycodone (OXY-IR) 5 MG capsule Take 5 mg by mouth every 4 (four) hours as needed.   Yes Historical Provider, MD   BP 139/91 mmHg  Pulse 95  Temp(Src) 100.1 F (37.8 C) (Oral)  Resp 24  Ht 5\' 8"  (1.727 m)  Wt 140 lb (63.504  kg)  BMI 21.29 kg/m2  SpO2 98% Physical Exam  Constitutional: He appears well-developed.  HENT:  Head: Normocephalic.  Left Ear: External ear normal.  Nose: Nose normal.  Mouth/Throat: Oropharyngeal exudate present.  Eyes: Conjunctivae and EOM are normal. Pupils are equal, round, and reactive to light.  Neck: Normal range of  motion. Neck supple.  Cardiovascular: Normal rate and normal heart sounds.   Pulmonary/Chest: Effort normal and breath sounds normal.  Abdominal: Soft.  Genitourinary: Rectum normal.  Musculoskeletal: Normal range of motion.  Skin: Skin is warm.    ED Course  Procedures (including critical care time) Labs Review Labs Reviewed  CBC WITH DIFFERENTIAL/PLATELET - Abnormal; Notable for the following:    RBC 3.09 (*)    Hemoglobin 8.5 (*)    HCT 26.4 (*)    Platelets 52 (*)    Neutrophils Relative % 81 (*)    Lymphocytes Relative 10 (*)    Lymphs Abs 0.5 (*)    All other components within normal limits  COMPREHENSIVE METABOLIC PANEL - Abnormal; Notable for the following:    Potassium 3.4 (*)    Glucose, Bld 163 (*)    Calcium 8.7 (*)    Total Bilirubin 1.6 (*)    All other components within normal limits  LIPASE, BLOOD - Abnormal; Notable for the following:    Lipase 11 (*)    All other components within normal limits  PROTIME-INR  APTT  OCCULT BLOOD X 1 CARD TO LAB, STOOL    Imaging Review No results found.   EKG Interpretation None      MDM  Pt given iv fluids, zofran,   Pt feels better,  hemocult is negative,  Lipase is normal.  Pt reports he has an appointment with his MD on July 8th.  I will give pt zofran and limited amount of his pain medication.  Pt's hemoglobin's reviewed.   Pt has had varying hemoglobins.   Pt advised no more alcohol    Final diagnoses:  Non-intractable vomiting without nausea, vomiting of unspecified type        Fransico Meadow, PA-C 09/24/14 [place], MD 09/24/14 2338

## 2014-09-24 NOTE — ED Notes (Signed)
Pt is see Dr. Rulon Eisenmenger. 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

## 2014-09-24 NOTE — ED Notes (Addendum)
Pt reports fever x 2-3 days.  Abdominal bloating.  Hx of pancreatitis.  Pt reports that he needs a refill of his pain medication also.

## 2014-09-25 ENCOUNTER — Telehealth: Payer: Self-pay | Admitting: *Deleted

## 2014-09-25 NOTE — Telephone Encounter (Signed)
Spoke with Rosann Auerbach who needed clarification of fill policy as pt had presented Oxycodone Rx to be filled. Obviously torn from another Rx. Verified that there was another Rx Zofran ODT XClarified that if this was a financial issue we could offer a [employer] coupon, but otherwise both Rxs would need to be filled or none.

## 2014-09-28 ENCOUNTER — Telehealth: Payer: Self-pay

## 2014-09-28 NOTE — Telephone Encounter (Signed)
Letter mailed to the pt as a lab reminder

## 2014-09-28 NOTE — Telephone Encounter (Signed)
-----   Message from Barron Alvine, [place] sent at 08/29/2014  1:47 PM EDT -----   ----- Message -----    From: Barron Alvine, CMA    Sent: 08/29/2014   1:46 PM      To: Barron Alvine, CMA  need repeat LFTs in 1 month  (see 08/29/14 result note)

## 2014-10-03 ENCOUNTER — Other Ambulatory Visit: Payer: BLUE CROSS/BLUE SHIELD

## 2014-10-30 ENCOUNTER — Encounter (HOSPITAL_BASED_OUTPATIENT_CLINIC_OR_DEPARTMENT_OTHER): Payer: Self-pay | Admitting: Adult Health

## 2014-10-30 ENCOUNTER — Emergency Department (HOSPITAL_BASED_OUTPATIENT_CLINIC_OR_DEPARTMENT_OTHER): Payer: BLUE CROSS/BLUE SHIELD

## 2014-10-30 ENCOUNTER — Inpatient Hospital Stay (HOSPITAL_BASED_OUTPATIENT_CLINIC_OR_DEPARTMENT_OTHER)
Admission: EM | Admit: 2014-10-30 | Discharge: 2014-11-01 | DRG: 194 | Disposition: A | Discharge status: Home / Self Care | Payer: BLUE CROSS/BLUE SHIELD | Attending: Internal Medicine | Admitting: Internal Medicine

## 2014-10-30 DIAGNOSIS — F17210 Nicotine dependence, cigarettes, uncomplicated: Secondary | ICD-10-CM | POA: Diagnosis present

## 2014-10-30 DIAGNOSIS — Z720 Tobacco use: Secondary | ICD-10-CM | POA: Diagnosis not present

## 2014-10-30 DIAGNOSIS — D61818 Other pancytopenia: Secondary | ICD-10-CM | POA: Diagnosis present

## 2014-10-30 DIAGNOSIS — K859 Acute pancreatitis without necrosis or infection, unspecified: Secondary | ICD-10-CM | POA: Diagnosis present

## 2014-10-30 DIAGNOSIS — K860 Alcohol-induced chronic pancreatitis: Secondary | ICD-10-CM

## 2014-10-30 DIAGNOSIS — J189 Pneumonia, unspecified organism: Secondary | ICD-10-CM | POA: Diagnosis present

## 2014-10-30 DIAGNOSIS — R1013 Epigastric pain: Secondary | ICD-10-CM

## 2014-10-30 DIAGNOSIS — Z79891 Long term (current) use of opiate analgesic: Secondary | ICD-10-CM

## 2014-10-30 DIAGNOSIS — F1021 Alcohol dependence, in remission: Secondary | ICD-10-CM | POA: Diagnosis present

## 2014-10-30 DIAGNOSIS — I2699 Other pulmonary embolism without acute cor pulmonale: Secondary | ICD-10-CM

## 2014-10-30 DIAGNOSIS — R0602 Shortness of breath: Secondary | ICD-10-CM

## 2014-10-30 DIAGNOSIS — F17200 Nicotine dependence, unspecified, uncomplicated: Secondary | ICD-10-CM

## 2014-10-30 DIAGNOSIS — K861 Other chronic pancreatitis: Secondary | ICD-10-CM | POA: Diagnosis present

## 2014-10-30 LAB — COMPREHENSIVE METABOLIC PANEL
ALK PHOS: 53 U/L (ref 38–126)
ALT: 21 U/L (ref 17–63)
AST: 30 U/L (ref 15–41)
Albumin: 3.5 g/dL (ref 3.5–5.0)
Anion gap: 9 (ref 5–15)
BUN: 17 mg/dL (ref 6–20)
CO2: 25 mmol/L (ref 22–32)
Calcium: 8.5 mg/dL — ABNORMAL LOW (ref 8.9–10.3)
Chloride: 107 mmol/L (ref 101–111)
Creatinine, Ser: 0.99 mg/dL (ref 0.61–1.24)
GFR calc non Af Amer: >60 mL/min (ref >60)
Glucose, Bld: 133 mg/dL — ABNORMAL HIGH (ref 65–99)
Potassium: 4.5 mmol/L (ref 3.5–5.1)
Sodium: 141 mmol/L (ref 135–145)
Total Bilirubin: 0.6 mg/dL (ref 0.3–1.2)
Total Protein: 6.8 g/dL (ref 6.5–8.1)

## 2014-10-30 LAB — CBC WITH DIFFERENTIAL/PLATELET
BASOS PCT: 1 % (ref 0–1)
Basophils Absolute: 0 10*3/uL (ref 0.0–0.1)
EOS ABS: 0.1 10*3/uL (ref 0.0–0.7)
Eosinophils Relative: 4 % (ref 0–5)
HEMATOCRIT: 28.3 % — AB (ref 39.0–52.0)
Hemoglobin: 9.1 g/dL — ABNORMAL LOW (ref 13.0–17.0)
LYMPHS PCT: 25 % (ref 12–46)
Lymphs Abs: 0.8 10*3/uL (ref 0.7–4.0)
MCH: 25.4 pg — AB (ref 26.0–34.0)
MCHC: 32.2 g/dL (ref 30.0–36.0)
MCV: 79.1 fL (ref 78.0–100.0)
MONO ABS: 0.3 10*3/uL (ref 0.1–1.0)
Monocytes Relative: 8 % (ref 3–12)
Neutro Abs: 2 10*3/uL (ref 1.7–7.7)
Neutrophils Relative %: 62 % (ref 43–77)
PLATELETS: 69 10*3/uL — AB (ref 150–400)
RBC: 3.58 MIL/uL — ABNORMAL LOW (ref 4.22–5.81)
RDW: 15.1 % (ref 11.5–15.5)
WBC: 3.2 10*3/uL — AB (ref 4.0–10.5)

## 2014-10-30 LAB — PROTIME-INR
INR: 1.11 (ref 0.00–1.49)
Prothrombin Time: 14.5 s (ref 11.6–15.2)

## 2014-10-30 LAB — OCCULT BLOOD X 1 CARD TO LAB, STOOL: FECAL OCCULT BLD: NEGATIVE

## 2014-10-30 LAB — LIPASE, BLOOD: Lipase: 17 U/L — ABNORMAL LOW (ref 22–51)

## 2014-10-30 LAB — BRAIN NATRIURETIC PEPTIDE: B Natriuretic Peptide: 11.5 pg/mL (ref 0.0–100.0)

## 2014-10-30 LAB — TROPONIN I: Troponin I: <0.03 ng/mL

## 2014-10-30 LAB — APTT: aPTT: 33 s (ref 24–37)

## 2014-10-30 MED ORDER — ALBUTEROL SULFATE (2.5 MG/3ML) 0.083% IN NEBU: 5.0000 mg | INHALATION_SOLUTION | Freq: Once | RESPIRATORY_TRACT | Status: DC

## 2014-10-30 MED ORDER — IOHEXOL 300 MG/ML  SOLN
50.0000 mL | Freq: Once | INTRAMUSCULAR | Status: AC | PRN
  Administered 2014-10-30: 50 mL via ORAL

## 2014-10-30 MED ORDER — DEXTROSE 5 % IV SOLN
1.0000 g | Freq: Once | INTRAVENOUS | Status: AC
  Administered 2014-10-31: 1 g via INTRAVENOUS

## 2014-10-30 MED ORDER — ONDANSETRON HCL 4 MG/2ML IJ SOLN
4.0000 mg | Freq: Once | INTRAMUSCULAR | Status: AC
  Administered 2014-10-30: 4 mg via INTRAVENOUS
  Filled 2014-10-30: qty 2

## 2014-10-30 MED ORDER — DEXTROSE 5 % IV SOLN
500.0000 mg | Freq: Once | INTRAVENOUS | Status: AC
  Administered 2014-10-31: 500 mg via INTRAVENOUS

## 2014-10-30 MED ORDER — IPRATROPIUM-ALBUTEROL 0.5-2.5 (3) MG/3ML IN SOLN
3.0000 mL | Freq: Once | RESPIRATORY_TRACT | Status: AC
  Administered 2014-10-30: 3 mL via RESPIRATORY_TRACT
  Filled 2014-10-30: qty 3

## 2014-10-30 MED ORDER — SODIUM CHLORIDE 0.9 % IV BOLUS (SEPSIS)
1000.0000 mL | Freq: Once | INTRAVENOUS | Status: AC
Start: 2014-10-31 — End: 2014-10-31
  Administered 2014-10-31: 1000 mL via INTRAVENOUS

## 2014-10-30 MED ORDER — HYDROMORPHONE HCL 1 MG/ML IJ SOLN
0.5000 mg | Freq: Once | INTRAMUSCULAR | Status: AC
  Administered 2014-10-30: 0.5 mg via INTRAVENOUS
  Filled 2014-10-30: qty 1

## 2014-10-30 MED ORDER — SODIUM CHLORIDE 0.9 % IV BOLUS (SEPSIS)
500.0000 mL | Freq: Once | INTRAVENOUS | Status: AC
  Administered 2014-10-30: 500 mL via INTRAVENOUS

## 2014-10-30 MED ORDER — METHYLPREDNISOLONE SODIUM SUCC 125 MG IJ SOLR
125.0000 mg | Freq: Once | INTRAMUSCULAR | Status: AC
Start: 2014-10-30 — End: 2014-10-30
  Administered 2014-10-30: 125 mg via INTRAVENOUS
  Filled 2014-10-30: qty 2

## 2014-10-30 MED ORDER — IOHEXOL 300 MG/ML  SOLN
100.0000 mL | Freq: Once | INTRAMUSCULAR | Status: AC | PRN
  Administered 2014-10-30: 100 mL via INTRAVENOUS

## 2014-10-30 NOTE — ED Notes (Signed)
MD at bedside. 

## 2014-10-30 NOTE — ED Notes (Signed)
Presents with SOB began earlier today- bilateral breath sounds diminished, increased WOB, +use of accessory muscles. A:so has HX of Pancreatits and has severe pain and rapid weight loss-has not kept appointment with [facility] for GI. Smoking history no History of COPD or Asthma.

## 2014-10-30 NOTE — ED Provider Notes (Signed)
CSN: [id number]     Arrival date & time 10/30/14  2042 History   First MD Initiated Contact with Patient 10/30/14 2048     Chief Complaint  Patient presents with  . Shortness of Breath     (Consider location/radiation/quality/duration/timing/severity/associated sxs/prior Treatment) HPI   Blood pressure 130/76, pulse 93, temperature 98.4 F (36.9 C), temperature source Oral, resp. rate 24, SpO2 100 %.  Lawrence Brock is a 44 y.o. 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 Patient states that he's had a 30 pound weight loss in 3 months work, has to go on disability. He was instructed to follow-up [facility] oncoplogy but he states he never followed up because he was afraid. Patient denies chest pain, vomiting. PCP tried to schedule colonoscopy patient was denied because he was less than 35 years of age.  Locally patient sees Dr. [name] of [facility], he also follows with Dr. Eugenia Pancoast at [facility].  PCP is Precious Haws  Past Medical History  Diagnosis Date  . Pancreatitis   . Chronic back pain   . Chronic pancreatitis 09/10/2013   Past Surgical History  Procedure Laterality Date  . Appendectomy    . Esophagogastroduodenoscopy (egd) with propofol N/A 09/14/2013    Procedure: ESOPHAGOGASTRODUODENOSCOPY (EGD) WITH PROPOFOL;  Surgeon: Jerene Bears, MD;  Location: [facility];  Service: Gastroenterology;  Laterality: N/A;  . Gastrointestinal stent removal N/A 09/14/2013    Procedure: GASTROINTESTINAL STENT REMOVAL;  Surgeon: Jerene Bears, MD;  Location: [facility];  Service: Gastroenterology;  Laterality: N/A;   pancreatic stent removal  . Ercp N/A 02/22/2014    Procedure: ENDOSCOPIC RETROGRADE CHOLANGIOPANCREATOGRAPHY (ERCP);  Surgeon: Ladene Artist, MD;  Location: [facility];  Service: Endoscopy;  Laterality: N/A;  . Ercp N/A 02/18/2014    Procedure: ENDOSCOPIC RETROGRADE CHOLANGIOPANCREATOGRAPHY (ERCP);  Surgeon: Inda Castle, MD;  Location: [facility];  Service: Gastroenterology;  Laterality: N/A;  . Endoscopic retrograde cholangiopancreatography (ercp) with propofol N/A 06/02/2014    Procedure: ENDOSCOPIC RETROGRADE CHOLANGIOPANCREATOGRAPHY (ERCP) WITH PROPOFOL;  Surgeon: Milus Banister, MD;  Location: [facility];  Service: Endoscopy;  Laterality: N/A;   History reviewed. No pertinent family history. History  Substance Use Topics  . Smoking status: Current Every Day Smoker -- 0.50 packs/day    Types: Cigarettes  . Smokeless tobacco: Never Used  . Alcohol Use: No     Comment: former    Review of Systems 10 systems reviewed and found to be negative, except as noted in the HPI.   Allergies  Review of patient's allergies indicates no known allergies.  Home Medications   Prior to Admission medications   Medication Sig Start Date End Date Taking? Authorizing Provider  amoxicillin-clavulanate (AUGMENTIN) 875-125 MG per tablet Take 1 tablet by mouth 2 (two) times daily.    Historical Provider, MD  HYDROcodone-ibuprofen (VICOPROFEN) 7.5-200 MG per tablet Take 1 tablet by mouth every 8 (eight) hours as needed for moderate pain.    Historical Provider, MD  lipase/protease/amylase (CREON) 12000 UNITS CPEP capsule Take by mouth.    Historical Provider, MD  ondansetron (ZOFRAN ODT) 4 MG disintegrating tablet Take 1 tablet (4 mg total) by mouth every 8 (  eight) hours as needed for nausea or vomiting. 09/24/14   Fransico Meadow, PA-C  oxycodone (OXY-IR) 5 MG capsule Take 1 capsule (5 mg total) by mouth every 4 (four) hours as needed. 09/24/14   Fransico Meadow, PA-C   BP 121/90 mmHg  Pulse 70   Temp(Src) 98.4 F (36.9 C) (Oral)  Resp 25  SpO2 100% Physical Exam  Constitutional: He is oriented to person, place, and time. No distress.  Appears chronically deconditioned  HENT:  Head: Normocephalic and atraumatic.  Mouth/Throat: Oropharynx is clear and moist.  Eyes: Conjunctivae and EOM are normal. Pupils are equal, round, and reactive to light. No scleral icterus.  Neck: Normal range of motion. No JVD present.  Cardiovascular: Normal rate, regular rhythm and intact distal pulses.   Pulmonary/Chest: Effort normal and breath sounds normal. No respiratory distress.  Tachypnea, with supraclavicular accessory muscle use and grunting. Patient is able to speak in short sentences. Lung sounds with adequate air movement, rhonchi at the right base. Decreased bibasilar  Abdominal: Soft. There is tenderness.  Palpation the right upper and epigastrium, no guarding or rebound.  Musculoskeletal: Normal range of motion. He exhibits no edema or tenderness.  Neurological: He is alert and oriented to person, place, and time.  Skin: He is not diaphoretic.  Psychiatric: He has a normal mood and affect.  Nursing note and vitals reviewed.   ED Course  Procedures (including critical care time) Labs Review Labs Reviewed  COMPREHENSIVE METABOLIC PANEL - Abnormal; Notable for the following:    Glucose, Bld 133 (*)    Calcium 8.5 (*)    All other components within normal limits  CBC WITH DIFFERENTIAL/PLATELET - Abnormal; Notable for the following:    WBC 3.2 (*)    RBC 3.58 (*)    Hemoglobin 9.1 (*)    HCT 28.3 (*)    MCH 25.4 (*)    Platelets 69 (*)    All other components within normal limits  LIPASE, BLOOD - Abnormal; Notable for the following:    Lipase 17 (*)    All other components within normal limits  TROPONIN I  BRAIN NATRIURETIC PEPTIDE  OCCULT BLOOD X 1 CARD TO LAB, STOOL  PROTIME-INR  APTT    Imaging Review Dg Chest 2 View  10/31/2014   CLINICAL DATA:  Shortness of breath.   Recent weight loss.  EXAM: CHEST  2 VIEW  COMPARISON:  February 25, 2014  FINDINGS: There is a focal area of infiltrate in the right base. The lungs elsewhere clear. Heart size and pulmonary vascularity are normal. No adenopathy. There is mid thoracic levoscoliosis with lower thoracic dextroscoliosis.  IMPRESSION: Focal area of infiltrate in the right base.   Electronically Signed   By: Lowella Grip III M.D.   On: 10/31/2014 00:32   Ct Abdomen Pelvis W Contrast  10/31/2014   CLINICAL DATA:  Acute onset of shortness of breath and severe abdominal pain. Rapid weight loss. Initial encounter.  EXAM: CT ABDOMEN AND PELVIS WITH CONTRAST  TECHNIQUE: Multidetector CT imaging of the abdomen and pelvis was performed using the standard protocol following bolus administration of intravenous contrast.  CONTRAST:  158mL OMNIPAQUE IOHEXOL 300 MG/ML  SOLN  COMPARISON:  CT of the abdomen and pelvis from 05/18/2014  FINDINGS: Mild bibasilar atelectasis is noted. Mild esophageal varices are noted.  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 Scattered splenic and gastric varices  are seen. There is diffuse prominence of the intrahepatic biliary ducts, though the common bile duct remains normal in caliber. This may reflect some degree of cholestasis due to chronic inflammation at the porta hepatis.  There is dilatation of the pancreatic duct to 1.0 cm, with mildly worsened pancreatic atrophy. Underlying diffuse soft tissue inflammation extends along the mesentery, likely reflecting sequelae of chronic pancreatitis. Soft tissue inflammation extends about the adjacent IVC and along the retroperitoneum. Dense calcification at the pancreatic head likely reflects sequelae of chronic pancreatitis.  Small bilateral renal cysts are seen. The kidneys are otherwise grossly unremarkable. There is no evidence of hydronephrosis. No  renal or ureteral stones are identified.  No free fluid is identified. The small bowel is unremarkable in appearance. The stomach is within normal limits. No acute vascular abnormalities are seen. Mild calcification is noted along the abdominal aorta and its branches.  The appendix is not definitely characterized; there is no evidence of appendicitis. The colon is grossly unremarkable in appearance.  The bladder is mildly distended and grossly unremarkable. The prostate remains normal in size. No inguinal lymphadenopathy is seen.  No acute osseous abnormalities are identified.  IMPRESSION: 1. Mildly worsened pancreatic atrophy noted, with dilatation of the pancreatic duct to 1.0 cm. Underlying diffuse soft tissue inflammation extends along the mesentery, likely reflecting sequelae of chronic pancreatitis. Soft tissue inflammation extends about the adjacent IVC and along the retroperitoneum. Associated dense calcification at the pancreatic head. 2. Chronic thrombosis of the portal vein, with associated cavernous transformation. Scattered splenic, gastric and mild esophageal varices seen. 3. Diffuse prominence of the intrahepatic biliary ducts, though the common bile duct remains normal in caliber. This may reflect some degree of cholestasis due to chronic inflammation at the porta hepatis. 4. Small bilateral renal cysts seen. 5. Mild bibasilar atelectasis noted. 6. Mild calcification along the abdominal aorta and its branches.   Electronically Signed   By: Garald Balding M.D.   On: 10/31/2014 00:09     EKG Interpretation   Date/Time:  Sunday October 30 2014 20:48:20 EDT Ventricular Rate:  85 PR Interval:  118 QRS Duration: 92 QT Interval:  386 QTC Calculation: 459 R Axis:   50 Text Interpretation:  Normal sinus rhythm Normal ECG Confirmed by  Alvino Chapel  MD, Ovid Curd [phone]) on 10/30/2014 8:53:35 PM      MDM   Final diagnoses:  CAP (community acquired pneumonia)  Abdominal pain, epigastric  SOB  (shortness of breath)  Alcohol-induced chronic pancreatitis  Tobacco use disorder    Filed Vitals:   10/30/14 2130 10/30/14 2200 10/30/14 2230 10/30/14 2340  BP: 129/88 126/83 121/90 128/88  Pulse: 73 73 70 72  Temp:      TempSrc:      Resp: 15 15 25 18   SpO2: 100% 100% 100% 100%    Medications  methylPREDNISolone sodium succinate (SOLU-MEDROL) 125 mg/2 mL injection 125 mg (125 mg Intravenous Given 10/30/14 2109)  ipratropium-albuterol (DUONEB) 0.5-2.5 (3) MG/3ML nebulizer solution 3 mL (3 mLs Nebulization Given 10/30/14 2107)  HYDROmorphone (DILAUDID) injection 0.5 mg (0.5 mg Intravenous Given 10/30/14 2109)  ondansetron (ZOFRAN) injection 4 mg (4 mg Intravenous Given 10/30/14 2109)  sodium chloride 0.9 % bolus 500 mL (0 mLs Intravenous Stopped 10/30/14 2236)  HYDROmorphone (DILAUDID) injection 0.5 mg (0.5 mg Intravenous Given 10/30/14 2229)  iohexol (OMNIPAQUE) 300 MG/ML solution 100 mL (100 mLs Intravenous Contrast Given 10/30/14 2250)  iohexol (OMNIPAQUE) 300 MG/ML solution 50 mL (50 mLs Oral Contrast Given 10/30/14 2130)  Lawrence Brock is a pleasant 44 y.o. male presenting with abdominal pain, bloody diarrhea shortness of breath. Increased work of bleed breathing with accessory muscle use and grunting. Guaiac is negative, he is anemic at 9.1 over 28.3, this is increased point since a measure a month ago but overall declined. His fecal occult blood is negative, serial abdominal exams remain benign. EKG nonischemic and troponin and pro BNP are negative. Wet read of x-ray with a right lower lobe infiltrate. Blood cultures and lactic acid pending. In setting of tobacco use, overall deconditioning, anemia, will need to be admitted for community-acquired pneumonia. No indication for emergent transfusion but he made need one on the floor.   Formal read of x-ray confirms a right lower lobe infiltrate. CT scan of abdomen shows a mildly worsened pancreatic atrophy with dilation of the pancreatic duct, there  is diffuse inflammation. 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  Case discussed with triad hospitalist Dr. Rozann Lesches who accepts admission to [place] bed. We have discussed the possible need for heparin considering the thrombosis in the portal vein however considering his low platelets and esophageal varices we will hold off on this for now.      Monico Blitz, PA-C 10/31/14 0106  Davonna Belling, MD 11/02/14 [phone]

## 2014-10-30 NOTE — ED Notes (Addendum)
Patient removed all of the electrodes off his body, thus taking himself off the cardiac monitor.

## 2014-10-30 NOTE — Progress Notes (Signed)
Patient ambulated around the department.  Patient's SPO2 remained between 94% and 96% and HR remained between 90 and 105.  Patient had to pause once due to pain.

## 2014-10-31 ENCOUNTER — Encounter (HOSPITAL_COMMUNITY): Payer: Self-pay | Admitting: *Deleted

## 2014-10-31 ENCOUNTER — Inpatient Hospital Stay (HOSPITAL_COMMUNITY): Payer: BLUE CROSS/BLUE SHIELD

## 2014-10-31 DIAGNOSIS — J189 Pneumonia, unspecified organism: Principal | ICD-10-CM

## 2014-10-31 DIAGNOSIS — K861 Other chronic pancreatitis: Secondary | ICD-10-CM | POA: Diagnosis not present

## 2014-10-31 DIAGNOSIS — F17210 Nicotine dependence, cigarettes, uncomplicated: Secondary | ICD-10-CM | POA: Diagnosis present

## 2014-10-31 DIAGNOSIS — F1021 Alcohol dependence, in remission: Secondary | ICD-10-CM | POA: Diagnosis present

## 2014-10-31 DIAGNOSIS — K859 Acute pancreatitis without necrosis or infection, unspecified: Secondary | ICD-10-CM | POA: Diagnosis present

## 2014-10-31 DIAGNOSIS — D61818 Other pancytopenia: Secondary | ICD-10-CM | POA: Diagnosis present

## 2014-10-31 DIAGNOSIS — Z720 Tobacco use: Secondary | ICD-10-CM | POA: Diagnosis present

## 2014-10-31 DIAGNOSIS — Z79891 Long term (current) use of opiate analgesic: Secondary | ICD-10-CM | POA: Diagnosis not present

## 2014-10-31 LAB — BASIC METABOLIC PANEL
ANION GAP: 8 (ref 5–15)
BUN: 11 mg/dL (ref 6–20)
CO2: 21 mmol/L — ABNORMAL LOW (ref 22–32)
Calcium: 7.8 mg/dL — ABNORMAL LOW (ref 8.9–10.3)
Chloride: 109 mmol/L (ref 101–111)
Creatinine, Ser: 0.9 mg/dL (ref 0.61–1.24)
GFR calc Af Amer: >60 mL/min (ref >60)
GFR calc non Af Amer: >60 mL/min (ref >60)
GLUCOSE: 130 mg/dL — AB (ref 65–99)
Potassium: 4.2 mmol/L (ref 3.5–5.1)
SODIUM: 138 mmol/L (ref 135–145)

## 2014-10-31 LAB — RAPID URINE DRUG SCREEN, HOSP PERFORMED
AMPHETAMINES: NOT DETECTED
BARBITURATES: NOT DETECTED
Benzodiazepines: NOT DETECTED
Cocaine: POSITIVE — AB
Opiates: POSITIVE — AB
Tetrahydrocannabinol: NOT DETECTED

## 2014-10-31 LAB — GLUCOSE, CAPILLARY
Glucose-Capillary: 140 mg/dL — ABNORMAL HIGH (ref 65–99)
Glucose-Capillary: 77 mg/dL (ref 65–99)

## 2014-10-31 LAB — HEPATIC FUNCTION PANEL
ALBUMIN: 3 g/dL — AB (ref 3.5–5.0)
ALT: 18 U/L (ref 17–63)
AST: 24 U/L (ref 15–41)
Alkaline Phosphatase: 53 U/L (ref 38–126)
Bilirubin, Direct: <0.1 mg/dL — ABNORMAL LOW (ref 0.1–0.5)
Total Bilirubin: 0.4 mg/dL (ref 0.3–1.2)
Total Protein: 6 g/dL — ABNORMAL LOW (ref 6.5–8.1)

## 2014-10-31 LAB — CBC WITH DIFFERENTIAL/PLATELET
Basophils Absolute: 0 10*3/uL (ref 0.0–0.1)
Basophils Relative: 1 % (ref 0–1)
EOS ABS: 0 10*3/uL (ref 0.0–0.7)
Eosinophils Relative: 0 % (ref 0–5)
HCT: 26.7 % — ABNORMAL LOW (ref 39.0–52.0)
Hemoglobin: 8.4 g/dL — ABNORMAL LOW (ref 13.0–17.0)
LYMPHS ABS: 0.2 10*3/uL — AB (ref 0.7–4.0)
Lymphocytes Relative: 13 % (ref 12–46)
MCH: 24.5 pg — ABNORMAL LOW (ref 26.0–34.0)
MCHC: 31.5 g/dL (ref 30.0–36.0)
MCV: 77.8 fL — ABNORMAL LOW (ref 78.0–100.0)
MONO ABS: 0 10*3/uL — AB (ref 0.1–1.0)
MONOS PCT: 3 % (ref 3–12)
NEUTROS ABS: 1.3 10*3/uL — AB (ref 1.7–7.7)
NEUTROS PCT: 84 % — AB (ref 43–77)
PLATELETS: 40 10*3/uL — AB (ref 150–400)
RBC: 3.43 MIL/uL — AB (ref 4.22–5.81)
RDW: 15.3 % (ref 11.5–15.5)
WBC: 1.6 10*3/uL — ABNORMAL LOW (ref 4.0–10.5)

## 2014-10-31 LAB — RETICULOCYTES
RBC.: 3.35 MIL/uL — AB (ref 4.22–5.81)
RETIC CT PCT: 1.6 % (ref 0.4–3.1)
Retic Count, Absolute: 53.6 10*3/uL (ref 19.0–186.0)

## 2014-10-31 LAB — TYPE AND SCREEN
ABO/RH(D): A POS
Antibody Screen: NEGATIVE

## 2014-10-31 LAB — HIV ANTIBODY (ROUTINE TESTING W REFLEX): HIV SCREEN 4TH GENERATION: NONREACTIVE

## 2014-10-31 LAB — I-STAT CG4 LACTIC ACID, ED: Lactic Acid, Venous: 1.15 mmol/L (ref 0.5–2.0)

## 2014-10-31 LAB — ABO/RH: ABO/RH(D): A POS

## 2014-10-31 LAB — VITAMIN B12: Vitamin B-12: 698 pg/mL (ref 180–914)

## 2014-10-31 LAB — STREP PNEUMONIAE URINARY ANTIGEN: Strep Pneumo Urinary Antigen: NEGATIVE

## 2014-10-31 LAB — TROPONIN I
Troponin I: <0.03 ng/mL (ref <0.031)
Troponin I: <0.03 ng/mL (ref <0.031)

## 2014-10-31 LAB — ETHANOL: Alcohol, Ethyl (B): <5 mg/dL (ref <5)

## 2014-10-31 LAB — FERRITIN: Ferritin: 12 ng/mL — ABNORMAL LOW (ref 24–336)

## 2014-10-31 LAB — FOLATE: Folate: 19.2 ng/mL (ref >5.9)

## 2014-10-31 LAB — IRON AND TIBC
Iron: 15 ug/dL — ABNORMAL LOW (ref 45–182)
Saturation Ratios: 4 % — ABNORMAL LOW (ref 17.9–39.5)
TIBC: 389 ug/dL (ref 250–450)
UIBC: 374 ug/dL

## 2014-10-31 MED ORDER — ONDANSETRON 4 MG PO TBDP: 4.0000 mg | ORAL_TABLET | Freq: Three times a day (TID) | ORAL | Status: DC | PRN

## 2014-10-31 MED ORDER — AZITHROMYCIN 500 MG IV SOLR
INTRAVENOUS | Status: AC
  Filled 2014-10-31: qty 500

## 2014-10-31 MED ORDER — HYDROMORPHONE HCL 1 MG/ML IJ SOLN
1.0000 mg | Freq: Once | INTRAMUSCULAR | Status: AC
  Administered 2014-10-31: 1 mg via INTRAVENOUS
  Filled 2014-10-31: qty 1

## 2014-10-31 MED ORDER — SODIUM CHLORIDE 0.9 % IV SOLN
INTRAVENOUS | Status: AC
  Administered 2014-10-31 (×2): via INTRAVENOUS

## 2014-10-31 MED ORDER — CEFTRIAXONE SODIUM 1 G IJ SOLR
1.0000 g | INTRAMUSCULAR | Status: DC
  Administered 2014-10-31: 1 g via INTRAVENOUS
  Filled 2014-10-31 (×2): qty 10

## 2014-10-31 MED ORDER — IOHEXOL 350 MG/ML SOLN
100.0000 mL | Freq: Once | INTRAVENOUS | Status: AC | PRN
  Administered 2014-10-31: 80 mL via INTRAVENOUS

## 2014-10-31 MED ORDER — HYDROMORPHONE HCL 1 MG/ML IJ SOLN
1.0000 mg | INTRAMUSCULAR | Status: DC | PRN
  Administered 2014-10-31: 1 mg via INTRAVENOUS
  Filled 2014-10-31: qty 1

## 2014-10-31 MED ORDER — HYDROMORPHONE HCL 1 MG/ML IJ SOLN
1.0000 mg | INTRAMUSCULAR | Status: DC | PRN
  Administered 2014-10-31 – 2014-11-01 (×7): 1 mg via INTRAVENOUS
  Filled 2014-10-31 (×7): qty 1

## 2014-10-31 MED ORDER — SODIUM CHLORIDE 0.9 % IV BOLUS (SEPSIS)
500.0000 mL | Freq: Once | INTRAVENOUS | Status: AC
  Administered 2014-10-31: 500 mL via INTRAVENOUS

## 2014-10-31 MED ORDER — ONDANSETRON HCL 4 MG PO TABS: 4.0000 mg | ORAL_TABLET | Freq: Four times a day (QID) | ORAL | Status: DC | PRN

## 2014-10-31 MED ORDER — ONDANSETRON HCL 4 MG/2ML IJ SOLN
4.0000 mg | Freq: Once | INTRAMUSCULAR | Status: AC
  Administered 2014-10-31: 4 mg via INTRAVENOUS
  Filled 2014-10-31: qty 2

## 2014-10-31 MED ORDER — CEFTRIAXONE SODIUM 1 G IJ SOLR
INTRAMUSCULAR | Status: AC
  Filled 2014-10-31: qty 10

## 2014-10-31 MED ORDER — ONDANSETRON HCL 4 MG/2ML IJ SOLN: 4.0000 mg | Freq: Four times a day (QID) | INTRAMUSCULAR | Status: DC | PRN

## 2014-10-31 MED ORDER — PANCRELIPASE (LIP-PROT-AMYL) 12000-38000 UNITS PO CPEP
12000.0000 [IU] | ORAL_CAPSULE | Freq: Three times a day (TID) | ORAL | Status: DC
  Administered 2014-10-31 – 2014-11-01 (×5): 12000 [IU] via ORAL
  Filled 2014-10-31 (×5): qty 1

## 2014-10-31 MED ORDER — ACETAMINOPHEN 650 MG RE SUPP: 650.0000 mg | Freq: Four times a day (QID) | RECTAL | Status: DC | PRN

## 2014-10-31 MED ORDER — DEXTROSE 5 % IV SOLN
500.0000 mg | INTRAVENOUS | Status: DC
  Administered 2014-11-01: 500 mg via INTRAVENOUS
  Filled 2014-10-31: qty 500

## 2014-10-31 MED ORDER — PANTOPRAZOLE SODIUM 40 MG IV SOLR
40.0000 mg | Freq: Two times a day (BID) | INTRAVENOUS | Status: DC
  Administered 2014-10-31 – 2014-11-01 (×3): 40 mg via INTRAVENOUS
  Filled 2014-10-31 (×3): qty 40

## 2014-10-31 MED ORDER — ACETAMINOPHEN 325 MG PO TABS: 650.0000 mg | ORAL_TABLET | Freq: Four times a day (QID) | ORAL | Status: DC | PRN

## 2014-10-31 NOTE — Care Management Note (Signed)
Case Management Note  Patient Details  Name: Scotland Korver MRN: [id number] Date of Birth: 09-11-1970  Subjective/Objective: 44 y.o. M admitted from Home with CAP. Pt has a hx of Chronic Pancreatitis.Lives in private residence with family. No anticipated Discharge needs at present.                    Action/Plan:Will continue to follow.    Expected Discharge Date:                  Expected Discharge Plan:  Home/Self Care  In-House Referral:     Discharge planning Services  CM Consult  Post Acute Care Choice:    Choice offered to:     DME Arranged:    DME Agency:     HH Arranged:    HH Agency:     Status of Service:  In process, will continue to follow  Medicare Important Message Given:    Date Medicare IM Given:    Medicare IM give by:    Date Additional Medicare IM Given:    Additional Medicare Important Message give by:     If discussed at [place] of Stay Meetings, dates discussed:    Additional Comments:  Delrae Sawyers, RN 10/31/2014, 3:09 PM

## 2014-10-31 NOTE — H&P (Addendum)
[facility] History and Physical  Lawrence Brock ZOX:[id number] DOB: 1971-01-17 DOA: 10/30/2014  Referring physician: Ms. Lawrence Brock. Patient was transferred from [place] at [facility]. PCP: Lawrence Bill, MD  Specialists: Dr. [name]. Gastroenterologist.  Chief Complaint: shortness of breath and abdominal pain.  HPI: Lawrence Brock is a 44 y.o. male history of chronic pancreatitis and biliary stricture presents to the ER with complaints of shortness of breath and abdominal pain. Patient states patient has chronic abdominal pain which has recently worsened. Denies any nausea vomiting diarrhea. Patient also notices increasing shortness of breath over the last 3 days with productive cough. In the ER chest x-ray shows features concerning for pneumonia. Patient has been admitted for further management of patient's abdominal pain and pneumonia. CT abdomen and pelvis shows chronic features. Labs do not show any acute elevation of lipase or LFTs. On exam patient has mild tenderness of the abdomen. Patient otherwise not in distress. Denies any chest pain. In addition patient has been stating that he has noticed some blood in his stools last week. Patient's hemoglobin also has been slowly decreasing over the last few months. When compared last month it has been stable.  Last December patient had liver abscess following which patient's biliary stent was removed and replaced which was eventually removed in March of this year.  Review of Systems: As presented in the history of presenting illness, rest negative.  Past Medical History  Diagnosis Date  . Pancreatitis   . Chronic back pain   . Chronic pancreatitis 09/10/2013   Past Surgical History  Procedure Laterality Date  . Appendectomy    . Esophagogastroduodenoscopy (egd) with propofol N/A 09/14/2013    Procedure: ESOPHAGOGASTRODUODENOSCOPY (EGD) WITH PROPOFOL;  Surgeon: Lawrence Bears, MD;  Location: [facility];  Service: Gastroenterology;   Laterality: N/A;  . Gastrointestinal stent removal N/A 09/14/2013    Procedure: GASTROINTESTINAL STENT REMOVAL;  Surgeon: Lawrence Bears, MD;  Location: [facility];  Service: Gastroenterology;  Laterality: N/A;  pancreatic stent removal  . Ercp N/A 02/22/2014    Procedure: ENDOSCOPIC RETROGRADE CHOLANGIOPANCREATOGRAPHY (ERCP);  Surgeon: Lawrence Artist, MD;  Location: [facility];  Service: Endoscopy;  Laterality: N/A;  . Ercp N/A 02/18/2014    Procedure: ENDOSCOPIC RETROGRADE CHOLANGIOPANCREATOGRAPHY (ERCP);  Surgeon: Lawrence Castle, MD;  Location: [facility];  Service: Gastroenterology;  Laterality: N/A;  . Endoscopic retrograde cholangiopancreatography (ercp) with propofol N/A 06/02/2014    Procedure: ENDOSCOPIC RETROGRADE CHOLANGIOPANCREATOGRAPHY (ERCP) WITH PROPOFOL;  Surgeon: Lawrence Banister, MD;  Location: [facility];  Service: Endoscopy;  Laterality: N/A;   Social History:  reports that he has been smoking Cigarettes.  He has been smoking about 0.50 packs per day. He has never used smokeless tobacco. He reports that he does not drink alcohol or use illicit drugs. Where does patient live home. Can patient participate in ADLs? Yes.  No Known Allergies  Family History:  Family History  Problem Relation Age of Onset  . Hypertension Mother       Prior to Admission medications   Medication Sig Start Date End Date Taking? Authorizing Provider  amoxicillin-clavulanate (AUGMENTIN) 875-125 MG per tablet Take 1 tablet by mouth 2 (two) times daily.    Historical Provider, MD  HYDROcodone-ibuprofen (VICOPROFEN) 7.5-200 MG per tablet Take 1 tablet by mouth every 8 (eight) hours as needed for moderate pain.    Historical Provider, MD  lipase/protease/amylase (CREON) 12000 UNITS CPEP capsule Take by mouth.    Historical Provider, MD  ondansetron (ZOFRAN ODT) 4  MG disintegrating tablet Take 1 tablet (4 mg total) by mouth every 8 (eight) hours as needed for nausea or vomiting. 09/24/14   Lawrence Meadow,  PA-C  oxycodone (OXY-IR) 5 MG capsule Take 1 capsule (5 mg total) by mouth every 4 (four) hours as needed. 09/24/14   Lawrence Meadow, PA-C    Physical Exam: Filed Vitals:   10/31/14 0200 10/31/14 0215 10/31/14 0329 10/31/14 0516  BP: 115/80 117/71 133/85 113/71  Pulse: 76 76 67 78  Temp:   97.7 F (36.5 C) 97.8 F (36.6 C)  TempSrc:   Oral Oral  Resp: 18 16 18 18   Height:   5\' 8"  (1.727 m)   Weight:   63.368 kg (139 lb 11.2 oz)   SpO2: 98% 97% 100% 97%     General:  Moderately built and nourished.  Eyes: anicteric no pallor.  ENT: no discharge from the ears eyes nose and mouth.  Neck: no mass felt.  Cardiovascular: S1-S2 heard.  Respiratory: no rhonchi or crepitations.  Abdomen: mild tenderness around the periumbilical area. No guarding or rigidity.  Skin: no rash.  Musculoskeletal: no edema.  Psychiatric: appears normal.  Neurologic: alert awake oriented to time place and person. Moves all extremities.  Labs on Admission:  Basic Metabolic Panel:  Recent Labs Lab 10/30/14 2050  NA 141  K 4.5  CL 107  CO2 25  GLUCOSE 133*  BUN 17  CREATININE 0.99  CALCIUM 8.5*   Liver Function Tests:  Recent Labs Lab 10/30/14 2050  AST 30  ALT 21  ALKPHOS 53  BILITOT 0.6  PROT 6.8  ALBUMIN 3.5    Recent Labs Lab 10/30/14 2050  LIPASE 17*   No results for input(s): AMMONIA in the last 168 hours. CBC:  Recent Labs Lab 10/30/14 2050  WBC 3.2*  NEUTROABS 2.0  HGB 9.1*  HCT 28.3*  MCV 79.1  PLT 69*   Cardiac Enzymes:  Recent Labs Lab 10/30/14 2050  TROPONINI <0.03    BNP (last 3 results)  Recent Labs  10/30/14 2050  BNP 11.5    ProBNP (last 3 results) No results for input(s): PROBNP in the last 8760 hours.  CBG: No results for input(s): GLUCAP in the last 168 hours.  Radiological Exams on Admission: Dg Chest 2 View  10/31/2014   CLINICAL DATA:  Shortness of breath.  Recent weight loss.  EXAM: CHEST  2 VIEW  COMPARISON:  February 25, 2014  FINDINGS: There is a focal area of infiltrate in the right base. The lungs elsewhere clear. Heart size and pulmonary vascularity are normal. No adenopathy. There is mid thoracic levoscoliosis with lower thoracic dextroscoliosis.  IMPRESSION: Focal area of infiltrate in the right base.   Electronically Signed   By: Lowella Grip III M.D.   On: 10/31/2014 00:32   Ct Abdomen Pelvis W Contrast  10/31/2014   CLINICAL DATA:  Acute onset of shortness of breath and severe abdominal pain. Rapid weight loss. Initial encounter.  EXAM: CT ABDOMEN AND PELVIS WITH CONTRAST  TECHNIQUE: Multidetector CT imaging of the abdomen and pelvis was performed using the standard protocol following bolus administration of intravenous contrast.  CONTRAST:  178mL OMNIPAQUE IOHEXOL 300 MG/ML  SOLN  COMPARISON:  CT of the abdomen and pelvis from 05/18/2014  FINDINGS: Mild bibasilar atelectasis is noted. Mild esophageal varices are noted.  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 Scattered  splenic and gastric varices are seen. There is diffuse prominence of the intrahepatic biliary ducts, though the common bile duct remains normal in caliber. This may reflect some degree of cholestasis due to chronic inflammation at the porta hepatis.  There is dilatation of the pancreatic duct to 1.0 cm, with mildly worsened pancreatic atrophy. Underlying diffuse soft tissue inflammation extends along the mesentery, likely reflecting sequelae of chronic pancreatitis. Soft tissue inflammation extends about the adjacent IVC and along the retroperitoneum. Dense calcification at the pancreatic head likely reflects sequelae of chronic pancreatitis.  Small bilateral renal cysts are seen. The kidneys are otherwise grossly unremarkable. There is no evidence of hydronephrosis. No renal or ureteral stones are identified.  No free fluid is identified.  The small bowel is unremarkable in appearance. The stomach is within normal limits. No acute vascular abnormalities are seen. Mild calcification is noted along the abdominal aorta and its branches.  The appendix is not definitely characterized; there is no evidence of appendicitis. The colon is grossly unremarkable in appearance.  The bladder is mildly distended and grossly unremarkable. The prostate remains normal in size. No inguinal lymphadenopathy is seen.  No acute osseous abnormalities are identified.  IMPRESSION: 1. Mildly worsened pancreatic atrophy noted, with dilatation of the pancreatic duct to 1.0 cm. Underlying diffuse soft tissue inflammation extends along the mesentery, likely reflecting sequelae of chronic pancreatitis. Soft tissue inflammation extends about the adjacent IVC and along the retroperitoneum. Associated dense calcification at the pancreatic head. 2. Chronic thrombosis of the portal vein, with associated cavernous transformation. Scattered splenic, gastric and mild esophageal varices seen. 3. Diffuse prominence of the intrahepatic biliary ducts, though the common bile duct remains normal in caliber. This may reflect some degree of cholestasis due to chronic inflammation at the porta hepatis. 4. Small bilateral renal cysts seen. 5. Mild bibasilar atelectasis noted. 6. Mild calcification along the abdominal aorta and its branches.   Electronically Signed   By: Garald Balding M.D.   On: 10/31/2014 00:09     Assessment/Plan Principal Problem:   CAP (community acquired pneumonia) Active Problems:   Chronic pancreatitis   Pancytopenia   Pancreatitis   1. Community-acquired pneumonia - patient has been placed on ceftriaxone and Zithromax. Check urine Legionella and strep antigen and HIV status. I have ordered CT angiogram of the chest to rule out PE. 2. Chronic pancreatitis with acute worsening of abdominal pain - follow LFTs. At this time and kept patient nothing by mouth and on  IV fluids. Consult gastroenterologist in a.m. For further recommendations. 3. History of alcoholism - patient states he has stopping alcohol many years ago but he did have 1 drink last month. 4. Anemia - follow CBC. Check anemia panel. Patient has been placed on Protonix.  I have reviewed patient's old charts of labs.  Patient's CT scan did show chronic portal vein thrombosis but given the recent history of rectal bleeding and also CAT scan showing varices I have not placed patient on any anticoagulation. Please discuss with GI in a.m.   DVT ProphylaxisSCDs.  Code Status: full code.  Family Communication: discussed with patient.  Disposition Plan: admit to inpatient.    KAKRAKANDY,ARSHAD N. [facility] Pager [phone].  If 7PM-7AM, please contact night-coverage [email or web address] Password [email or web address] 10/31/2014, 5:25 AM

## 2014-10-31 NOTE — Progress Notes (Signed)
[facility] PROGRESS NOTE  Lawrence Brock GBT:[id number] DOB: 1970-09-20 DOA: 10/30/2014 PCP: [name], MD  Assessment/Plan: 1. CAP 1. Stable 2. Cont current abx for now 2. Chronic pancreatitis 1. Improved 2. Pt wanting to eat regular food 3. Will advance diet as tolerated 3. Hx ETOH abuse 1. Pt reports abstaining from ETOH 2. ETOH levels neg 4. Anemia 1. Monitor closely 2. Stable 5. DVT prophylaxis 1. SCD's  Code Status: Full Family Communication: Pt in room (indicate person spoken with, relationship, and if by phone, the number) Disposition Plan: Pending   Consultants:    Procedures:    Antibiotics:   (indicate start date, and stop date if known)  HPI/Subjective: Feels much better. Wants to eat  Objective: Filed Vitals:   10/31/14 0329 10/31/14 0516 10/31/14 1339 10/31/14 2009  BP: 133/85 113/71 100/64 106/67  Pulse: 67 78 72 69  Temp: 97.7 F (36.5 C) 97.8 F (36.6 C) 97.8 F (36.6 C) 98.1 F (36.7 C)  TempSrc: Oral Oral Oral Oral  Resp: 18 18 18 18   Height: 5\' 8"  (1.727 m)     Weight: 63.368 kg (139 lb 11.2 oz)     SpO2: 100% 97% 100% 97%    Intake/Output Summary (Last 24 hours) at 10/31/14 2221 Last data filed at 10/31/14 2148  Gross per 24 hour  Intake 3848.33 ml  Output   1000 ml  Net 2848.33 ml   Filed Weights   10/31/14 0329  Weight: 63.368 kg (139 lb 11.2 oz)    Exam:   General:  Awake, in nad  Cardiovascular: regular, s1, s2  Respiratory: normal resp effort, no wheezing  Abdomen: soft,nondistended  Musculoskeletal: perfused, no clubbing   Data Reviewed: Basic Metabolic Panel:  Recent Labs Lab 10/30/14 2050 10/31/14 0646  NA 141 138  K 4.5 4.2  CL 107 109  CO2 25 21*  GLUCOSE 133* 130*  BUN 17 11  CREATININE 0.99 0.90  CALCIUM 8.5* 7.8*   Liver Function Tests:  Recent Labs Lab 10/30/14 2050 10/31/14 0646  AST 30 24  ALT 21 18  ALKPHOS 53 53  BILITOT 0.6 0.4  PROT 6.8 6.0*  ALBUMIN  3.5 3.0*    Recent Labs Lab 10/30/14 2050  LIPASE 17*   No results for input(s): AMMONIA in the last 168 hours. CBC:  Recent Labs Lab 10/30/14 2050 10/31/14 0646  WBC 3.2* 1.6*  NEUTROABS 2.0 1.3*  HGB 9.1* 8.4*  HCT 28.3* 26.7*  MCV 79.1 77.8*  PLT 69* 40*   Cardiac Enzymes:  Recent Labs Lab 10/30/14 2050 10/31/14 0646 10/31/14 1120 10/31/14 1724  TROPONINI <0.03 <0.03 <0.03 <0.03   BNP (last 3 results)  Recent Labs  10/30/14 2050  BNP 11.5    ProBNP (last 3 results) No results for input(s): PROBNP in the last 8760 hours.  CBG:  Recent Labs Lab 10/31/14 1138 10/31/14 1716  GLUCAP 140* 77    No results found for this or any previous visit (from the past 240 hour(s)).   Studies: Dg Chest 2 View  10/31/2014   CLINICAL DATA:  Shortness of breath.  Recent weight loss.  EXAM: CHEST  2 VIEW  COMPARISON:  February 25, 2014  FINDINGS: There is a focal area of infiltrate in the right base. The lungs elsewhere clear. Heart size and pulmonary vascularity are normal. No adenopathy. There is mid thoracic levoscoliosis with lower thoracic dextroscoliosis.  IMPRESSION: Focal area of infiltrate in the right base.   Electronically Signed  By: Lowella Grip III M.D.   On: 10/31/2014 00:32   Ct Angio Chest Pe W/cm &/or Wo Cm  10/31/2014   CLINICAL DATA:  Increasing shortness of breath over 3 days  EXAM: CT ANGIOGRAPHY CHEST WITH CONTRAST  TECHNIQUE: Multidetector CT imaging of the chest was performed using the standard protocol during bolus administration of intravenous contrast. Multiplanar CT image reconstructions and MIPs were obtained to evaluate the vascular anatomy.  CONTRAST:  11mL OMNIPAQUE IOHEXOL 350 MG/ML SOLN  COMPARISON:  10/30/2014  FINDINGS: The lungs are well aerated bilaterally and show evidence of mild patchy infiltrate in the right upper lobe as well as the lower lobes bilaterally. No focal confluent infiltrate is seen. No sizable effusion is noted. A  focal parenchymal nodule is seen. Mild emphysematous changes are noted.  The thoracic inlet is within normal limits. The thoracic aorta and its branches are unremarkable. Pulmonary artery is well visualized bilaterally. A normal branching pattern is seen. No focal filling defect to suggest pulmonary embolism is identified. No hilar or mediastinal adenopathy is seen.  Visualized upper abdomen is within normal limits. The known esophageal varices are not well appreciated on this exam due to the timing of the contrast bolus. The osseous structures show no acute abnormality.  Review of the MIP images confirms the above findings.  IMPRESSION: No evidence pulmonary emboli.  Minimal patchy changes in the right upper and bilateral lower lobes. No focal confluent infiltrate is seen.   Electronically Signed   By: Inez Catalina M.D.   On: 10/31/2014 08:17   Ct Abdomen Pelvis W Contrast  10/31/2014   CLINICAL DATA:  Acute onset of shortness of breath and severe abdominal pain. Rapid weight loss. Initial encounter.  EXAM: CT ABDOMEN AND PELVIS WITH CONTRAST  TECHNIQUE: Multidetector CT imaging of the abdomen and pelvis was performed using the standard protocol following bolus administration of intravenous contrast.  CONTRAST:  173mL OMNIPAQUE IOHEXOL 300 MG/ML  SOLN  COMPARISON:  CT of the abdomen and pelvis from 05/18/2014  FINDINGS: Mild bibasilar atelectasis is noted. Mild esophageal varices are noted.  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 Scattered splenic and gastric varices are seen. There is diffuse prominence of the intrahepatic biliary ducts, though the common bile duct remains normal in caliber. This may reflect some degree of cholestasis due to chronic inflammation at the porta hepatis.  There is dilatation of the pancreatic duct to 1.0 cm, with mildly worsened pancreatic atrophy. Underlying diffuse  soft tissue inflammation extends along the mesentery, likely reflecting sequelae of chronic pancreatitis. Soft tissue inflammation extends about the adjacent IVC and along the retroperitoneum. Dense calcification at the pancreatic head likely reflects sequelae of chronic pancreatitis.  Small bilateral renal cysts are seen. The kidneys are otherwise grossly unremarkable. There is no evidence of hydronephrosis. No renal or ureteral stones are identified.  No free fluid is identified. The small bowel is unremarkable in appearance. The stomach is within normal limits. No acute vascular abnormalities are seen. Mild calcification is noted along the abdominal aorta and its branches.  The appendix is not definitely characterized; there is no evidence of appendicitis. The colon is grossly unremarkable in appearance.  The bladder is mildly distended and grossly unremarkable. The prostate remains normal in size. No inguinal lymphadenopathy is seen.  No acute osseous abnormalities are identified.  IMPRESSION: 1. Mildly worsened pancreatic atrophy noted, with dilatation of the pancreatic  duct to 1.0 cm. Underlying diffuse soft tissue inflammation extends along the mesentery, likely reflecting sequelae of chronic pancreatitis. Soft tissue inflammation extends about the adjacent IVC and along the retroperitoneum. Associated dense calcification at the pancreatic head. 2. Chronic thrombosis of the portal vein, with associated cavernous transformation. Scattered splenic, gastric and mild esophageal varices seen. 3. Diffuse prominence of the intrahepatic biliary ducts, though the common bile duct remains normal in caliber. This may reflect some degree of cholestasis due to chronic inflammation at the porta hepatis. 4. Small bilateral renal cysts seen. 5. Mild bibasilar atelectasis noted. 6. Mild calcification along the abdominal aorta and its branches.   Electronically Signed   By: Garald Balding M.D.   On: 10/31/2014 00:09     Scheduled Meds: . azithromycin  500 mg Intravenous Q24H  . cefTRIAXone (ROCEPHIN)  IV  1 g Intravenous Q24H  . lipase/protease/amylase  12,000 Units Oral TID AC  . pantoprazole (PROTONIX) IV  40 mg Intravenous Q12H   Continuous Infusions: . sodium chloride 75 mL/hr at 10/31/14 2148    Principal Problem:   CAP (community acquired pneumonia) Active Problems:   Chronic pancreatitis   Pancytopenia   Pancreatitis     Aiken Withem K  [facility] Pager [phone]. If 7PM-7AM, please contact night-coverage at [email or web address], password [facility] 10/31/2014, 10:21 PM  LOS: 0 days

## 2014-10-31 NOTE — ED Notes (Signed)
[facility] here for transport 

## 2014-10-31 NOTE — ED Notes (Signed)
2nd IV started, BC x2 and lactic acid drawn, returned to monitor, pt alert, NAD, calm, interactive, resps e/u, speaking in clear complete sentences, skin W&D, VSS. NSR on monitor.

## 2014-11-01 DIAGNOSIS — D61818 Other pancytopenia: Secondary | ICD-10-CM

## 2014-11-01 DIAGNOSIS — K861 Other chronic pancreatitis: Secondary | ICD-10-CM

## 2014-11-01 LAB — COMPREHENSIVE METABOLIC PANEL
ALBUMIN: 2.8 g/dL — AB (ref 3.5–5.0)
ALT: 17 U/L (ref 17–63)
ANION GAP: 5 (ref 5–15)
AST: 21 U/L (ref 15–41)
Alkaline Phosphatase: 45 U/L (ref 38–126)
BUN: 10 mg/dL (ref 6–20)
CHLORIDE: 108 mmol/L (ref 101–111)
CO2: 26 mmol/L (ref 22–32)
Calcium: 7.9 mg/dL — ABNORMAL LOW (ref 8.9–10.3)
Creatinine, Ser: 0.94 mg/dL (ref 0.61–1.24)
GFR calc Af Amer: >60 mL/min (ref >60)
GFR calc non Af Amer: >60 mL/min (ref >60)
Glucose, Bld: 91 mg/dL (ref 65–99)
POTASSIUM: 4.1 mmol/L (ref 3.5–5.1)
Sodium: 139 mmol/L (ref 135–145)
TOTAL PROTEIN: 5.6 g/dL — AB (ref 6.5–8.1)
Total Bilirubin: 0.7 mg/dL (ref 0.3–1.2)

## 2014-11-01 LAB — GLUCOSE, CAPILLARY
GLUCOSE-CAPILLARY: 116 mg/dL — AB (ref 65–99)
GLUCOSE-CAPILLARY: 135 mg/dL — AB (ref 65–99)
GLUCOSE-CAPILLARY: 82 mg/dL (ref 65–99)

## 2014-11-01 LAB — CBC WITH DIFFERENTIAL/PLATELET
BASOS ABS: 0 10*3/uL (ref 0.0–0.1)
Basophils Relative: 0 % (ref 0–1)
Eosinophils Absolute: 0.1 10*3/uL (ref 0.0–0.7)
Eosinophils Relative: 2 % (ref 0–5)
HEMATOCRIT: 24.7 % — AB (ref 39.0–52.0)
HEMOGLOBIN: 7.7 g/dL — AB (ref 13.0–17.0)
LYMPHS ABS: 0.6 10*3/uL — AB (ref 0.7–4.0)
Lymphocytes Relative: 28 % (ref 12–46)
MCH: 24.8 pg — AB (ref 26.0–34.0)
MCHC: 31.2 g/dL (ref 30.0–36.0)
MCV: 79.4 fL (ref 78.0–100.0)
MONO ABS: 0.2 10*3/uL (ref 0.1–1.0)
Monocytes Relative: 7 % (ref 3–12)
Neutro Abs: 1.3 10*3/uL — ABNORMAL LOW (ref 1.7–7.7)
Neutrophils Relative %: 62 % (ref 43–77)
Platelets: 41 10*3/uL — ABNORMAL LOW (ref 150–400)
RBC: 3.11 MIL/uL — ABNORMAL LOW (ref 4.22–5.81)
RDW: 15.6 % — ABNORMAL HIGH (ref 11.5–15.5)
WBC: 2.1 10*3/uL — ABNORMAL LOW (ref 4.0–10.5)

## 2014-11-01 LAB — LEGIONELLA ANTIGEN, URINE

## 2014-11-01 MED ORDER — OXYCODONE HCL 5 MG PO CAPS
5.0000 mg | ORAL_CAPSULE | ORAL | Status: DC | PRN
Start: 2014-11-01 — End: 2014-12-30

## 2014-11-01 MED ORDER — CEFUROXIME AXETIL 250 MG PO TABS: 250.0000 mg | ORAL_TABLET | Freq: Two times a day (BID) | ORAL | Status: DC

## 2014-11-01 MED ORDER — AZITHROMYCIN 250 MG PO TABS: ORAL_TABLET | ORAL | Status: DC

## 2014-11-01 NOTE — Discharge Summary (Signed)
Physician Discharge Summary  Lawrence Brock TIW:[id number] DOB: [birth year] DOA: 10/30/2014  PCP: [name], MD  Admit date: 10/30/2014 Discharge date: 11/01/2014  Time spent: 20 minutes  Recommendations for Outpatient Follow-up:  1. Follow up with PCP in 1-2 weeks 2. Recommend following CBC closely for chronic pancytopenia  Discharge Diagnoses:  Principal Problem:   CAP (community acquired pneumonia) Active Problems:   Chronic pancreatitis   Pancytopenia   Pancreatitis   Discharge Condition: Improved  Diet recommendation: Regular  Filed Weights   10/31/14 0329  Weight: 63.368 kg (139 lb 11.2 oz)    History of present illness:  Please review h and p from 8/8 for details. Briefly, pt presents with complaints of abd pain with radiographic evidence suggestive of R basiar infiltrate. The patient was admitted for further work up.  Hospital Course:  1. CAP 1. Patient remained hemodynamically stable and on minimal O2 support 2. Patient was continued on empiric azithromycin with rocephin 3. Pt to complete course with azithromycin with ceftin 2. Chronic pancreatitis 1. Presenting lipase of 17 2. Symptoms improved with bowel rest 3. Successfully advanced diet to regular diet 3. Hx ETOH abuse 1. Pt reports abstaining from ETOH 2. ETOH levels neg 4. Anemia 1. Monitor closely 2. Stable 5. DVT prophylaxis 1. SCD's 6. Pancytopenia 1. This appears to be a chronic process 2. Would follow this closely as outpatient  Discharge Exam: Filed Vitals:   10/31/14 1339 10/31/14 2009 11/01/14 0612 11/01/14 1434  BP: 100/64 106/67 101/69 113/75  Pulse: 72 69 81 74  Temp: 97.8 F (36.6 C) 98.1 F (36.7 C) 98.1 F (36.7 C) 98.4 F (36.9 C)  TempSrc: Oral Oral Oral Oral  Resp: 18 18 20 12   Height:      Weight:      SpO2: 100% 97% 99% 100%    General: Awake, in nad Cardiovascular: regular, s1, s2 Respiratory: normal resp effort, no wheezing  Discharge Instructions      Medication List    TAKE these medications        azithromycin 250 MG tablet  Commonly known as:  ZITHROMAX  1 tab po qday     cefUROXime 250 MG tablet  Commonly known as:  CEFTIN  Take 1 tablet (250 mg total) by mouth 2 (two) times daily with a meal.     lipase/protease/amylase 12000 UNITS Cpep capsule  Commonly known as:  CREON  Take 12,000 Units by mouth 3 (three) times daily before meals.     ondansetron 4 MG disintegrating tablet  Commonly known as:  ZOFRAN ODT  Take 1 tablet (4 mg total) by mouth every 8 (eight) hours as needed for nausea or vomiting.     oxycodone 5 MG capsule  Commonly known as:  OXY-IR  Take 1 capsule (5 mg total) by mouth every 4 (four) hours as needed.     pantoprazole 20 MG tablet  Commonly known as:  PROTONIX  Take 20 mg by mouth daily.       No Known Allergies Follow-up Information    Follow up with [name], MD. Schedule an appointment as soon as possible for a visit in 2 weeks.   Specialty:  Family Medicine   Why:  Hospital follow up   Contact information:   [address] [place] [address] [phone]        The results of significant diagnostics from this hospitalization (including imaging, microbiology, ancillary and laboratory) are listed below for  reference.    Significant Diagnostic Studies: Dg Chest 2 View  10/31/2014   CLINICAL DATA:  Shortness of breath.  Recent weight loss.  EXAM: CHEST  2 VIEW  COMPARISON:  February 25, 2014  FINDINGS: There is a focal area of infiltrate in the right base. The lungs elsewhere clear. Heart size and pulmonary vascularity are normal. No adenopathy. There is mid thoracic levoscoliosis with lower thoracic dextroscoliosis.  IMPRESSION: Focal area of infiltrate in the right base.   Electronically Signed   By: Lowella Grip III M.D.   On: 10/31/2014 00:32   Ct Angio Chest Pe W/cm &/or Wo Cm  10/31/2014   CLINICAL DATA:  Increasing shortness of  breath over 3 days  EXAM: CT ANGIOGRAPHY CHEST WITH CONTRAST  TECHNIQUE: Multidetector CT imaging of the chest was performed using the standard protocol during bolus administration of intravenous contrast. Multiplanar CT image reconstructions and MIPs were obtained to evaluate the vascular anatomy.  CONTRAST:  36mL OMNIPAQUE IOHEXOL 350 MG/ML SOLN  COMPARISON:  10/30/2014  FINDINGS: The lungs are well aerated bilaterally and show evidence of mild patchy infiltrate in the right upper lobe as well as the lower lobes bilaterally. No focal confluent infiltrate is seen. No sizable effusion is noted. A focal parenchymal nodule is seen. Mild emphysematous changes are noted.  The thoracic inlet is within normal limits. The thoracic aorta and its branches are unremarkable. Pulmonary artery is well visualized bilaterally. A normal branching pattern is seen. No focal filling defect to suggest pulmonary embolism is identified. No hilar or mediastinal adenopathy is seen.  Visualized upper abdomen is within normal limits. The known esophageal varices are not well appreciated on this exam due to the timing of the contrast bolus. The osseous structures show no acute abnormality.  Review of the MIP images confirms the above findings.  IMPRESSION: No evidence pulmonary emboli.  Minimal patchy changes in the right upper and bilateral lower lobes. No focal confluent infiltrate is seen.   Electronically Signed   By: Inez Catalina M.D.   On: 10/31/2014 08:17   Ct Abdomen Pelvis W Contrast  10/31/2014   CLINICAL DATA:  Acute onset of shortness of breath and severe abdominal pain. Rapid weight loss. Initial encounter.  EXAM: CT ABDOMEN AND PELVIS WITH CONTRAST  TECHNIQUE: Multidetector CT imaging of the abdomen and pelvis was performed using the standard protocol following bolus administration of intravenous contrast.  CONTRAST:  133mL OMNIPAQUE IOHEXOL 300 MG/ML  SOLN  COMPARISON:  CT of the abdomen and pelvis from 05/18/2014  FINDINGS:  Mild bibasilar atelectasis is noted. Mild esophageal varices are noted.  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 Scattered splenic and gastric varices are seen. There is diffuse prominence of the intrahepatic biliary ducts, though the common bile duct remains normal in caliber. This may reflect some degree of cholestasis due to chronic inflammation at the porta hepatis.  There is dilatation of the pancreatic duct to 1.0 cm, with mildly worsened pancreatic atrophy. Underlying diffuse soft tissue inflammation extends along the mesentery, likely reflecting sequelae of chronic pancreatitis. Soft tissue inflammation extends about the adjacent IVC and along the retroperitoneum. Dense calcification at the pancreatic head likely reflects sequelae of chronic pancreatitis.  Small bilateral renal cysts are seen. The kidneys are otherwise grossly unremarkable. There is no evidence of hydronephrosis. No renal or ureteral stones are identified.  No free fluid is identified. The small  bowel is unremarkable in appearance. The stomach is within normal limits. No acute vascular abnormalities are seen. Mild calcification is noted along the abdominal aorta and its branches.  The appendix is not definitely characterized; there is no evidence of appendicitis. The colon is grossly unremarkable in appearance.  The bladder is mildly distended and grossly unremarkable. The prostate remains normal in size. No inguinal lymphadenopathy is seen.  No acute osseous abnormalities are identified.  IMPRESSION: 1. Mildly worsened pancreatic atrophy noted, with dilatation of the pancreatic duct to 1.0 cm. Underlying diffuse soft tissue inflammation extends along the mesentery, likely reflecting sequelae of chronic pancreatitis. Soft tissue inflammation extends about the adjacent IVC and along the retroperitoneum. Associated dense  calcification at the pancreatic head. 2. Chronic thrombosis of the portal vein, with associated cavernous transformation. Scattered splenic, gastric and mild esophageal varices seen. 3. Diffuse prominence of the intrahepatic biliary ducts, though the common bile duct remains normal in caliber. This may reflect some degree of cholestasis due to chronic inflammation at the porta hepatis. 4. Small bilateral renal cysts seen. 5. Mild bibasilar atelectasis noted. 6. Mild calcification along the abdominal aorta and its branches.   Electronically Signed   By: Garald Balding M.D.   On: 10/31/2014 00:09    Microbiology: Recent Results (from the past 240 hour(s))  Blood culture (routine x 2)     Status: None (Preliminary result)   Collection Time: 10/31/14 12:10 AM  Result Value Ref Range Status   Specimen Description BLOOD RIGHT FOREARM  Final   Special Requests BOTTLES DRAWN AEROBIC AND ANAEROBIC 10CC  Final   Culture   Final    NO GROWTH 1 DAY Performed at [facility]    Report Status PENDING  Incomplete  Blood culture (routine x 2)     Status: None (Preliminary result)   Collection Time: 10/31/14 12:15 AM  Result Value Ref Range Status   Specimen Description BLOOD RIGHT ARM  Final   Special Requests BOTTLES DRAWN AEROBIC AND ANAEROBIC 10CC  Final   Culture   Final    NO GROWTH 1 DAY Performed at [facility]    Report Status PENDING  Incomplete     Labs: Basic Metabolic Panel:  Recent Labs Lab 10/30/14 2050 10/31/14 0646 11/01/14 0749  NA 141 138 139  K 4.5 4.2 4.1  CL 107 109 108  CO2 25 21* 26  GLUCOSE 133* 130* 91  BUN 17 11 10   CREATININE 0.99 0.90 0.94  CALCIUM 8.5* 7.8* 7.9*   Liver Function Tests:  Recent Labs Lab 10/30/14 2050 10/31/14 0646 11/01/14 0749  AST 30 24 21   ALT 21 18 17   ALKPHOS 53 53 45  BILITOT 0.6 0.4 0.7  PROT 6.8 6.0* 5.6*  ALBUMIN 3.5 3.0* 2.8*    Recent Labs Lab 10/30/14 2050  LIPASE 17*   No results for input(s):  AMMONIA in the last 168 hours. CBC:  Recent Labs Lab 10/30/14 2050 10/31/14 0646 11/01/14 0749  WBC 3.2* 1.6* 2.1*  NEUTROABS 2.0 1.3* 1.3*  HGB 9.1* 8.4* 7.7*  HCT 28.3* 26.7* 24.7*  MCV 79.1 77.8* 79.4  PLT 69* 40* 41*   Cardiac Enzymes:  Recent Labs Lab 10/30/14 2050 10/31/14 0646 10/31/14 1120 10/31/14 1724  TROPONINI <0.03 <0.03 <0.03 <0.03   BNP: BNP (last 3 results)  Recent Labs  10/30/14 2050  BNP 11.5    ProBNP (last 3 results) No results for input(s): PROBNP in the last 8760 hours.  CBG:  Recent Labs Lab 10/31/14 1138 10/31/14 1716 10/31/14 2352 11/01/14 0608 11/01/14 1214  GLUCAP 140* 77 135* 82 116*   Signed:  Quandra Fedorchak K  [facility] 11/01/2014, 8:15 PM

## 2014-11-01 NOTE — Plan of Care (Signed)
Problem: Consults Goal: General Medical Patient Education See Patient Education Module for specific education. Outcome: Progressing CAP

## 2014-11-01 NOTE — Care Management Note (Signed)
Case Management Note  Patient Details  Name: Lawrence Brock MRN: [id number] Date of Birth: 06/09/1970  Subjective/Objective:        Patient for dc today, no needs.            Action/Plan:   Expected Discharge Date:                  Expected Discharge Plan:  Home/Self Care  In-House Referral:     Discharge planning Services  CM Consult  Post Acute Care Choice:    Choice offered to:     DME Arranged:    DME Agency:     HH Arranged:    [place] Agency:     Status of Service:  Completed, signed off  Medicare Important Message Given:    Date Medicare IM Given:    Medicare IM give by:    Date Additional Medicare IM Given:    Additional Medicare Important Message give by:     If discussed at [place] of Stay Meetings, dates discussed:    Additional Comments:  Zenon Mayo, RN 11/01/2014, 11:10 AM

## 2014-11-05 LAB — CULTURE, BLOOD (ROUTINE X 2)
CULTURE: NO GROWTH
CULTURE: NO GROWTH

## 2014-11-16 ENCOUNTER — Telehealth: Payer: Self-pay | Admitting: Physician Assistant

## 2014-11-17 NOTE — Telephone Encounter (Signed)
Pt aware that a PA has been started and it takes up to 48 hours to get a response.  They will call back with any further concerns

## 2014-12-08 ENCOUNTER — Telehealth: Payer: Self-pay | Admitting: Emergency Medicine

## 2014-12-08 NOTE — Telephone Encounter (Signed)
PA started for [place] on CoverMyMeds

## 2014-12-15 ENCOUNTER — Telehealth: Payer: Self-pay

## 2014-12-15 NOTE — Telephone Encounter (Signed)
-----   Message from Milus Banister, MD sent at 12/15/2014 10:51 AM EDT ----- Yes, thanks.  That would be great   ----- Message -----    From: Audrea Muscat, CMA    Sent: 12/15/2014  10:44 AM      To: Milus Banister, MD, Barron Alvine, CMA    ----- Message -----    From: Audrea Muscat, CMA    Sent: 12/14/2014  10:33 AM      To: Milus Banister, MD  Forgive me if I already sent this message, I feel like it may not have gone through so I'm resending.  This is the patient who as been taking 36,000 creon - 2 with each meal but can't afford it.  May I have your permission to change it to Zenpep so I can use the special voucher we have access to and give him about 6 months for free?  Thanks!

## 2014-12-21 ENCOUNTER — Telehealth: Payer: Self-pay

## 2014-12-21 NOTE — Telephone Encounter (Signed)
Voucher used, zen-pep filled

## 2014-12-21 NOTE — Telephone Encounter (Signed)
-----   Message from Milus Banister, MD sent at 12/15/2014 10:51 AM EDT ----- Yes, thanks.  That would be great   ----- Message -----    From: Audrea Muscat, CMA    Sent: 12/15/2014  10:44 AM      To: Milus Banister, MD, Barron Alvine, CMA    ----- Message -----    From: Audrea Muscat, CMA    Sent: 12/14/2014  10:33 AM      To: Milus Banister, MD  Forgive me if I already sent this message, I feel like it may not have gone through so I'm resending.  This is the patient who as been taking 36,000 creon - 2 with each meal but can't afford it.  May I have your permission to change it to Zenpep so I can use the special voucher we have access to and give him about 6 months for free?  Thanks!

## 2014-12-22 ENCOUNTER — Ambulatory Visit: Payer: BLUE CROSS/BLUE SHIELD | Admitting: Gastroenterology

## 2014-12-29 ENCOUNTER — Ambulatory Visit (INDEPENDENT_AMBULATORY_CARE_PROVIDER_SITE_OTHER): Payer: BLUE CROSS/BLUE SHIELD | Admitting: Gastroenterology

## 2014-12-29 ENCOUNTER — Other Ambulatory Visit (INDEPENDENT_AMBULATORY_CARE_PROVIDER_SITE_OTHER): Payer: BLUE CROSS/BLUE SHIELD

## 2014-12-29 ENCOUNTER — Encounter: Payer: Self-pay | Admitting: Gastroenterology

## 2014-12-29 VITALS — BP 120/82 | HR 80 | Ht 68.0 in | Wt 144.2 lb

## 2014-12-29 DIAGNOSIS — K860 Alcohol-induced chronic pancreatitis: Secondary | ICD-10-CM | POA: Diagnosis not present

## 2014-12-29 DIAGNOSIS — K922 Gastrointestinal hemorrhage, unspecified: Secondary | ICD-10-CM

## 2014-12-29 DIAGNOSIS — D649 Anemia, unspecified: Secondary | ICD-10-CM | POA: Diagnosis not present

## 2014-12-29 DIAGNOSIS — I81 Portal vein thrombosis: Secondary | ICD-10-CM

## 2014-12-29 DIAGNOSIS — K7030 Alcoholic cirrhosis of liver without ascites: Secondary | ICD-10-CM | POA: Insufficient documentation

## 2014-12-29 LAB — CBC WITH DIFFERENTIAL/PLATELET
BASOS ABS: 0 10*3/uL (ref 0.0–0.1)
Basophils Relative: 0.3 % (ref 0.0–3.0)
EOS ABS: 0.1 10*3/uL (ref 0.0–0.7)
Eosinophils Relative: 3.4 % (ref 0.0–5.0)
HEMATOCRIT: 37.1 % — AB (ref 39.0–52.0)
HEMOGLOBIN: 12.1 g/dL — AB (ref 13.0–17.0)
LYMPHS PCT: 19.9 % (ref 12.0–46.0)
Lymphs Abs: 0.7 10*3/uL (ref 0.7–4.0)
MCHC: 32.6 g/dL (ref 30.0–36.0)
MCV: 77.7 fl — AB (ref 78.0–100.0)
MONOS PCT: 6.9 % (ref 3.0–12.0)
Monocytes Absolute: 0.2 10*3/uL (ref 0.1–1.0)
Neutro Abs: 2.3 10*3/uL (ref 1.4–7.7)
Neutrophils Relative %: 69.5 % (ref 43.0–77.0)
RBC: 4.78 Mil/uL (ref 4.22–5.81)
RDW: 21.6 % — ABNORMAL HIGH (ref 11.5–15.5)
WBC: 3.3 10*3/uL — AB (ref 4.0–10.5)

## 2014-12-29 LAB — COMPREHENSIVE METABOLIC PANEL
ALK PHOS: 72 U/L (ref 39–117)
ALT: 25 U/L (ref 0–53)
AST: 20 U/L (ref 0–37)
Albumin: 4.2 g/dL (ref 3.5–5.2)
BILIRUBIN TOTAL: 1.8 mg/dL — AB (ref 0.2–1.2)
BUN: 19 mg/dL (ref 6–23)
CALCIUM: 9.3 mg/dL (ref 8.4–10.5)
CO2: 26 meq/L (ref 19–32)
CREATININE: 1.08 mg/dL (ref 0.40–1.50)
Chloride: 106 mEq/L (ref 96–112)
GFR: 79.15 mL/min (ref >60.00)
GLUCOSE: 97 mg/dL (ref 70–99)
Potassium: 3.5 mEq/L (ref 3.5–5.1)
Sodium: 142 mEq/L (ref 135–145)
TOTAL PROTEIN: 7.6 g/dL (ref 6.0–8.3)

## 2014-12-29 LAB — PROTIME-INR
INR: 1.1 ratio — ABNORMAL HIGH (ref 0.8–1.0)
PROTHROMBIN TIME: 12.6 s (ref 9.6–13.1)

## 2014-12-29 NOTE — Progress Notes (Signed)
12/29/2014 Lawrence Brock [id number] Dec 07, 1970   History of Present Illness:  See Dr. Eugenia Pancoast note from 03/29/2014 for details regarding patient's complex medical problems.  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  Was referred by Dr. Eugenia Pancoast at [facility] by Dr. [name] and was seen there in April at which time observation was recommended, but he never followed up again as directed.  He is on Zenpep pancreatic enzymes.  Most recent LFT's normal except a slightly elevated total bili of 1.8.  Continues to complain of abdominal pain.  Patient patient comes in today at the request of his PCP due to complaints of both black stools as well as red blood on occasion.  Had been taking Pepto-Bismol, but even since discontinuing that a couple of weeks ago his stools are very dark.  He still has frequent stools, especially after eating.  Bowels make a lot of noise.  He does have varices by CT scan.  Hgb had been improved with most recent labs a few weeks ago; Hgb 11.2.  Platelets 42.    Was using ETOH again until about 50 days ago.  Recently returned home from rehab.  Patient is tearful and asking if he will ever be back to normal again.   Current Medications, Allergies, Past Medical History, Past Surgical History, Family History and Social History were reviewed in [employer] record.   Physical Exam: BP 120/82 mmHg  Pulse 80  Ht 5\' 8"  (1.727 m)  Wt 144 lb 4 oz (65.431 kg)  BMI 21.94 kg/m2 General: Well developed male in no acute distress; tearful. Head: Normocephalic and atraumatic Eyes:  Sclerae anicteric, conjunctiva pink  Ears: Normal auditory acuity Lungs: Clear throughout to auscultation Heart: Regular rate and rhythm Abdomen: Soft, non-distended.  Hyperactive bowel sounds.  Diffuse TTP  > in upper abdomen. Musculoskeletal: Symmetrical with no gross deformities  Extremities: No edema  Neurological: Alert oriented x 4, grossly non-focal Psychological:  Alert and cooperative. 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  Was referred by Dr. Eugenia Pancoast at [facility] by Dr. [name] and needs to follow-up with him regarding these issues.  Will check CBC, CMP, and PT/INR today.  Is on Zenpep pancreatic enzymes. -Gastrointestinal bleeding:  Patient describes both black stools as well as red blood on occasion.  He does have varices by CT scan.  Will perform both EGD and colonoscopy at [facility] hospital to evaluate bleeding.  The risks, benefits, and alternatives to EGD and colonoscopy were discussed with the patient and he consents to proceed. Will check CBC again today as stated above, but Hgb had been improved with most recent labs a few weeks ago. -ETOH abuse:  Was using ETOH again until about 50 days ago.  Recently returned home from rehab.  *Patient asking for note to be out of work for another month until he can see when he can get another appt with Dr. Eugenia Pancoast.  He is out on short-term disability, which goes month to month apparently.  They are asking for an anticipated return to work date, however, I told patient that I am not sure when that will be.  Informed him that he needs to  follow-up with Dr. Eugenia Pancoast again to make decisions regarding care to have a better idea.  He reports that he tried to go back to work but had pain, dizziness, and extreme fatigue.  Works 12 hour shifts.    **45 minutes spent with patient over 50% in counseling and discussion of options and situation.

## 2014-12-29 NOTE — Patient Instructions (Signed)
You have been scheduled for an endoscopy and colonoscopy. Please follow the written instructions given to you at your visit today. Please pick up your prep supplies at the pharmacy within the next 1-3 days. If you use inhalers (even only as needed), please bring them with you on the day of your procedure. Your physician has requested that you go to www.startemmi.com and enter the access code given to you at your visit today. This web site gives a general overview about your procedure. However, you should still follow specific instructions given to you by our office regarding your preparation for the procedure.  Your physician has requested that you go to the basement for the following lab work before leaving today:  CBC, CMET, PT INR  Follow up with Dr Eugenia Pancoast

## 2014-12-30 ENCOUNTER — Emergency Department (HOSPITAL_BASED_OUTPATIENT_CLINIC_OR_DEPARTMENT_OTHER)
Admission: EM | Admit: 2014-12-30 | Discharge: 2014-12-30 | Disposition: A | Discharge status: Home / Self Care | Payer: BLUE CROSS/BLUE SHIELD | Attending: Emergency Medicine | Admitting: Emergency Medicine

## 2014-12-30 ENCOUNTER — Encounter (HOSPITAL_COMMUNITY): Payer: Self-pay | Admitting: *Deleted

## 2014-12-30 ENCOUNTER — Encounter: Payer: Self-pay | Admitting: Gastroenterology

## 2014-12-30 ENCOUNTER — Encounter (HOSPITAL_BASED_OUTPATIENT_CLINIC_OR_DEPARTMENT_OTHER): Payer: Self-pay

## 2014-12-30 DIAGNOSIS — R17 Unspecified jaundice: Secondary | ICD-10-CM | POA: Diagnosis not present

## 2014-12-30 DIAGNOSIS — R1013 Epigastric pain: Secondary | ICD-10-CM | POA: Diagnosis present

## 2014-12-30 DIAGNOSIS — Z79899 Other long term (current) drug therapy: Secondary | ICD-10-CM | POA: Insufficient documentation

## 2014-12-30 DIAGNOSIS — G8929 Other chronic pain: Secondary | ICD-10-CM | POA: Insufficient documentation

## 2014-12-30 DIAGNOSIS — Z720 Tobacco use: Secondary | ICD-10-CM | POA: Diagnosis not present

## 2014-12-30 DIAGNOSIS — D61818 Other pancytopenia: Secondary | ICD-10-CM | POA: Insufficient documentation

## 2014-12-30 DIAGNOSIS — Z8719 Personal history of other diseases of the digestive system: Secondary | ICD-10-CM | POA: Diagnosis not present

## 2014-12-30 HISTORY — DX: Essential (primary) hypertension: I10

## 2014-12-30 LAB — CBC WITH DIFFERENTIAL/PLATELET
BASOS ABS: 0 10*3/uL (ref 0.0–0.1)
BASOS PCT: 1 %
Eosinophils Absolute: 0.1 10*3/uL (ref 0.0–0.7)
Eosinophils Relative: 5 %
HCT: 33.9 % — ABNORMAL LOW (ref 39.0–52.0)
Hemoglobin: 11.1 g/dL — ABNORMAL LOW (ref 13.0–17.0)
Lymphocytes Relative: 24 %
Lymphs Abs: 0.6 10*3/uL — ABNORMAL LOW (ref 0.7–4.0)
MCH: 25.5 pg — ABNORMAL LOW (ref 26.0–34.0)
MCHC: 32.7 g/dL (ref 30.0–36.0)
MCV: 77.8 fL — ABNORMAL LOW (ref 78.0–100.0)
Monocytes Absolute: 0.2 10*3/uL (ref 0.1–1.0)
Monocytes Relative: 8 %
NEUTROS ABS: 1.6 10*3/uL — AB (ref 1.7–7.7)
NEUTROS PCT: 63 %
PLATELETS: 39 10*3/uL — AB (ref 150–400)
RBC: 4.36 MIL/uL (ref 4.22–5.81)
RDW: 19.1 % — AB (ref 11.5–15.5)
WBC: 2.5 10*3/uL — AB (ref 4.0–10.5)

## 2014-12-30 LAB — COMPREHENSIVE METABOLIC PANEL
ALBUMIN: 3.7 g/dL (ref 3.5–5.0)
ALT: 24 U/L (ref 17–63)
ANION GAP: 5 (ref 5–15)
AST: 22 U/L (ref 15–41)
Alkaline Phosphatase: 63 U/L (ref 38–126)
BILIRUBIN TOTAL: 1.5 mg/dL — AB (ref 0.3–1.2)
BUN: 25 mg/dL — ABNORMAL HIGH (ref 6–20)
CO2: 25 mmol/L (ref 22–32)
Calcium: 8.7 mg/dL — ABNORMAL LOW (ref 8.9–10.3)
Chloride: 111 mmol/L (ref 101–111)
Creatinine, Ser: 1.11 mg/dL (ref 0.61–1.24)
GLUCOSE: 71 mg/dL (ref 65–99)
POTASSIUM: 3.6 mmol/L (ref 3.5–5.1)
Sodium: 141 mmol/L (ref 135–145)
TOTAL PROTEIN: 6.9 g/dL (ref 6.5–8.1)

## 2014-12-30 LAB — LIPASE, BLOOD: LIPASE: 19 U/L — AB (ref 22–51)

## 2014-12-30 MED ORDER — OXYCODONE HCL 5 MG PO CAPS: 5.0000 mg | ORAL_CAPSULE | ORAL | Status: DC | PRN

## 2014-12-30 NOTE — Progress Notes (Signed)
i agree with the above note, plan 

## 2014-12-30 NOTE — ED Notes (Signed)
Pt c/o upper abdominal pain since 0230, denies n/v/d; states hx of acute pancreatitis, states out of his oxycodone 5mg s, usually gets it from here.

## 2014-12-30 NOTE — Telephone Encounter (Signed)
A user error has taken place.

## 2014-12-30 NOTE — Discharge Instructions (Signed)
Keep your appointment with Dr. Luciana Brock next week.   Abdominal Pain, Adult Many things can cause abdominal pain. Usually, abdominal pain is not caused by a disease and will improve without treatment. It can often be observed and treated at home. Your health care provider will do a physical exam and possibly order blood tests and X-rays to help determine the seriousness of your pain. However, in many cases, more time must pass before a clear cause of the pain can be found. Before that point, your health care provider may not know if you need more testing or further treatment. HOME CARE INSTRUCTIONS Monitor your abdominal pain for any changes. The following actions may help to alleviate any discomfort you are experiencing:  Only take over-the-counter or prescription medicines as directed by your health care provider.  Do not take laxatives unless directed to do so by your health care provider.  Try a clear liquid diet (broth, tea, or water) as directed by your health care provider. Slowly move to a bland diet as tolerated. SEEK MEDICAL CARE IF:  You have unexplained abdominal pain.  You have abdominal pain associated with nausea or diarrhea.  You have pain when you urinate or have a bowel movement.  You experience abdominal pain that wakes you in the night.  You have abdominal pain that is worsened or improved by eating food.  You have abdominal pain that is worsened with eating fatty foods.  You have a fever. SEEK IMMEDIATE MEDICAL CARE IF:  Your pain does not go away within 2 hours.  You keep throwing up (vomiting).  Your pain is felt only in portions of the abdomen, such as the right side or the left lower portion of the abdomen.  You pass bloody or black tarry stools. MAKE SURE YOU:  Understand these instructions.  Will watch your condition.  Will get help right away if you are not doing well or get worse.   This information is not intended to replace advice given to you by  your health care provider. Make sure you discuss any questions you have with your health care provider.   Document Released: 12/19/2004 Document Revised: 11/30/2014 Document Reviewed: 11/18/2012 Elsevier Interactive Patient Education 2016 [employer].  Oxycodone tablets or capsules What is this medicine? OXYCODONE (ox i KOE done) is a pain reliever. It is used to treat moderate to severe pain. This medicine may be used for other purposes; ask your health care provider or pharmacist if you have questions. What should I tell my health care provider before I take this medicine? They need to know if you have any of these conditions: -Addison's disease -brain tumor -head injury -heart disease -history of drug or alcohol abuse problem -if you often drink alcohol -kidney disease -liver disease -lung or breathing disease, like asthma -mental illness -pancreatic disease -seizures -thyroid disease -an unusual or allergic reaction to oxycodone, codeine, hydrocodone, morphine, other medicines, foods, dyes, or preservatives -pregnant or trying to get pregnant -breast-feeding How should I use this medicine? Take this medicine by mouth with a glass of water. Follow the directions on the prescription label. You can take it with or without food. If it upsets your stomach, take it with food. Take your medicine at regular intervals. Do not take it more often than directed. Do not stop taking except on your doctor's advice. Some brands of this medicine, like Oxecta, have special instructions. Ask your doctor or pharmacist if these directions are for you: Do not cut, crush or  chew this medicine. Swallow only one tablet at a time. Do not wet, soak, or lick the tablet before you take it. Talk to your pediatrician regarding the use of this medicine in children. Special care may be needed. Overdosage: If you think you have taken too much of this medicine contact a poison control center or emergency room at  once. NOTE: This medicine is only for you. Do not share this medicine with others. What if I miss a dose? If you miss a dose, take it as soon as you can. If it is almost time for your next dose, take only that dose. Do not take double or extra doses. What may interact with this medicine? -alcohol -antihistamines -certain medicines used for nausea like chlorpromazine, droperidol -erythromycin -ketoconazole -medicines for depression, anxiety, or psychotic disturbances -medicines for sleep -muscle relaxants -naloxone -naltrexone -narcotic medicines (opiates) for pain -nilotinib -phenobarbital -phenytoin -rifampin -ritonavir -voriconazole This list may not describe all possible interactions. Give your health care provider a list of all the medicines, herbs, non-prescription drugs, or dietary supplements you use. Also tell them if you smoke, drink alcohol, or use illegal drugs. Some items may interact with your medicine. What should I watch for while using this medicine? Tell your doctor or health care professional if your pain does not go away, if it gets worse, or if you have new or a different type of pain. You may develop tolerance to the medicine. Tolerance means that you will need a higher dose of the medicine for pain relief. Tolerance is normal and is expected if you take this medicine for a long time. Do not suddenly stop taking your medicine because you may develop a severe reaction. Your body becomes used to the medicine. This does NOT mean you are addicted. Addiction is a behavior related to getting and using a drug for a non-medical reason. If you have pain, you have a medical reason to take pain medicine. Your doctor will tell you how much medicine to take. If your doctor wants you to stop the medicine, the dose will be slowly lowered over time to avoid any side effects. You may get drowsy or dizzy when you first start taking this medicine or change doses. Do not drive, use  machinery, or do anything that may be dangerous until you know how the medicine affects you. Stand or sit up slowly. There are different types of narcotic medicines (opiates) for pain. If you take more than one type at the same time, you may have more side effects. Give your health care provider a list of all medicines you use. Your doctor will tell you how much medicine to take. Do not take more medicine than directed. Call emergency for help if you have problems breathing. This medicine will cause constipation. Try to have a bowel movement at least every 2 to 3 days. If you do not have a bowel movement for 3 days, call your doctor or health care professional. Your mouth may get dry. Drinking water, chewing sugarless gum, or sucking on hard candy may help. See your dentist every 6 months. What side effects may I notice from receiving this medicine? Side effects that you should report to your doctor or health care professional as soon as possible: -allergic reactions like skin rash, itching or hives, swelling of the face, lips, or tongue -breathing problems -confusion -feeling faint or lightheaded, falls -trouble passing urine or change in the amount of urine -unusually weak or tired Side effects that usually  do not require medical attention (report to your doctor or health care professional if they continue or are bothersome): -constipation -dry mouth -itching -nausea, vomiting -upset stomach This list may not describe all possible side effects. Call your doctor for medical advice about side effects. You may report side effects to FDA at 1-800-FDA-1088. Where should I keep my medicine? Keep out of the reach of children. This medicine can be abused. Keep your medicine in a safe place to protect it from theft. Do not share this medicine with anyone. Selling or giving away this medicine is dangerous and against the law. Store at room temperature between 15 and 30 degrees C (59 and 86 degrees F).  Protect from light. Keep container tightly closed. This medicine may cause accidental overdose and death if it is taken by other adults, children, or pets. Flush any unused medicine down the toilet to reduce the chance of harm. Do not use the medicine after the expiration date. NOTE: This sheet is a summary. It may not cover all possible information. If you have questions about this medicine, talk to your doctor, pharmacist, or health care provider.    2016, Elsevier/Gold Standard. (2014-07-23 01:15:14)

## 2014-12-30 NOTE — ED Provider Notes (Signed)
CSN: [id number]     Arrival date & time 12/30/14  0539 History   First MD Initiated Contact with Patient 12/30/14 0542     Chief Complaint  Patient presents with  . Abdominal Pain     (Consider location/radiation/quality/duration/timing/severity/associated sxs/prior Treatment) The history is provided by the patient.   44 year old male with history of chronic pancreatitis and biliary obstruction and stent placement states that he is been having epigastric pain since 2 AM. Pain is severe and nonradiating. He denies nausea or vomiting and denies fever or chills. Pain is rated at 8/10. He took a dose of the muscle relaxer with no relief. He states he normally takes oxycodone for similar pain but has run out and is asking for a prescription to tide him over until he can see his PCP in 4 days. Nothing makes pain better nothing makes it worse. He denies any alcohol consumption.  Past Medical History  Diagnosis Date  . Pancreatitis   . Chronic back pain   . Chronic pancreatitis ([place] [facility]) 09/10/2013   Past Surgical History  Procedure Laterality Date  . Appendectomy    . Esophagogastroduodenoscopy (egd) with propofol N/A 09/14/2013    Procedure: ESOPHAGOGASTRODUODENOSCOPY (EGD) WITH PROPOFOL;  Surgeon: Jerene Bears, MD;  Location: [facility] END[facility]COPY;  Service: Gastroenterology;  Laterality: N/A;  . Gastrointestinal stent removal N/A 09/14/2013    Procedure: GASTROINTESTINAL STENT REMOVAL;  Surgeon: Jerene Bears, MD;  Location: [facility] ENDOSCOPY;  Service: Gastroenterology;  Laterality: N/A;  pancreatic stent removal  . Ercp N/A 02/22/2014    Procedure: ENDOSCOPIC RETROGRADE CHOLANGIOPANCREATOGRAPHY (ERCP);  Surgeon: Ladene Artist, MD;  Location: [facility];  Service: Endoscopy;  Laterality: N/A;  . Ercp N/A 02/18/2014    Procedure: ENDOSCOPIC RETROGRADE CHOLANGIOPANCREATOGRAPHY (ERCP);  Surgeon: Inda Castle, MD;  Location: [facility] ORS;  Service: Gastroenterology;  Laterality: N/A;  . Endoscopic retrograde  cholangiopancreatography (ercp) with propofol N/A 06/02/2014    Procedure: ENDOSCOPIC RETROGRADE CHOLANGIOPANCREATOGRAPHY (ERCP) WITH PROPOFOL;  Surgeon: Milus Banister, MD;  Location: [facility];  Service: Endoscopy;  Laterality: N/A;   Family History  Problem Relation Age of Onset  . Hypertension Mother    Social History  Substance Use Topics  . Smoking status: Current Every Day Smoker -- 0.50 packs/day    Types: Cigarettes  . Smokeless tobacco: Never Used  . Alcohol Use: No     Comment: former, quit a month ago per patient    Review of Systems  All other systems reviewed and are negative.     Allergies  Review of patient's allergies indicates no known allergies.  Home Medications   Prior to Admission medications   Medication Sig Start Date End Date Taking? Authorizing Provider  oxycodone (OXY-IR) 5 MG capsule Take 1 capsule (5 mg total) by mouth every 4 (four) hours as needed. 11/01/14   Donne Hazel, MD  Pancrelipase, Lip-Prot-Amyl, (ZENPEP PO) Take by mouth.    Historical Provider, MD  pantoprazole (PROTONIX) 20 MG tablet Take 20 mg by mouth daily.    Historical Provider, MD   BP 136/85 mmHg  Pulse 86  Temp(Src) 98 F (36.7 C) (Oral)  Resp 18  Ht 5\' 8"  (1.727 m)  Wt 140 lb (63.504 kg)  BMI 21.29 kg/m2  SpO2 100% Physical Exam  Nursing note and vitals reviewed.  44 year old male, resting comfortably and in no acute distress. Vital signs are normal. Oxygen saturation is 100%, which is normal. Head is normocephalic and atraumatic. PERRLA, EOMI. Oropharynx is clear.  Neck is nontender and supple without adenopathy or JVD. Back is nontender and there is no CVA tenderness. Lungs are clear without rales, wheezes, or rhonchi. Chest is nontender. Heart has regular rate and rhythm without murmur. Abdomen is soft, flat, with moderate epigastric tenderness. There is no rebound or guarding. There are no masses or hepatosplenomegaly and peristalsis is  normoactive. Extremities have no cyanosis or edema, full range of motion is present. Skin is warm and dry without rash. Neurologic: Mental status is normal, cranial nerves are intact, there are no motor or sensory deficits.  ED Course  Procedures (including critical care time) Labs Review Results for orders placed or performed during the hospital encounter of 12/30/14  Comprehensive metabolic panel  Result Value Ref Range   Sodium 141 135 - 145 mmol/L   Potassium 3.6 3.5 - 5.1 mmol/L   Chloride 111 101 - 111 mmol/L   CO2 25 22 - 32 mmol/L   Glucose, Bld 71 65 - 99 mg/dL   BUN 25 (H) 6 - 20 mg/dL   Creatinine, Ser 1.11 0.61 - 1.24 mg/dL   Calcium 8.7 (L) 8.9 - 10.3 mg/dL   Total Protein 6.9 6.5 - 8.1 g/dL   Albumin 3.7 3.5 - 5.0 g/dL   AST 22 15 - 41 U/L   ALT 24 17 - 63 U/L   Alkaline Phosphatase 63 38 - 126 U/L   Total Bilirubin 1.5 (H) 0.3 - 1.2 mg/dL   GFR calc non Af Amer >60 >60 mL/min   GFR calc Af Amer >60 >60 mL/min   Anion gap 5 5 - 15  Lipase, blood  Result Value Ref Range   Lipase 19 (L) 22 - 51 U/L   I have personally reviewed and evaluated these lab results as part of my medical decision-making.  MDM   Final diagnoses:  Epigastric pain  Elevated bilirubin  Pancytopenia (HCC)    Epigastric pain in patient with known history of chronic pancreatitis. Old records are reviewed and he apparently also has history of portal vein thrombosis and esophageal varices and GI bleed. Laboratory workup has been done at his gastroenterologist's office yesterday. Will repeat labs today. Yesterday, no lipase was drawn. Lipase will be checked today. He is driving himself home, so no narcotics are given in the ED. His record on the [place] controlled substance reporting website was reviewed and his last narcotic prescription was filled on August 11 for 15 oxycodone tablets. I do not see any pattern of narcotic prescriptions to indicate significant abuse.  Lipase is actually  low. BUN has increased slightly from yesterday so patient will be advised to increase fluid consumption. Bilirubin has decreased slightly. Hemoglobin has dropped slightly, but is actually significantly above levels from August. HB advised to watch for signs of bleeding and return for repeat CBC if he has any melanoma, hematochezia, or hematemesis. He is scheduled to see his PCP in 4 days and hemoglobin can be rechecked then if he has no clinical bleeding.  Delora Fuel, MD 63/87/56 [id number]

## 2015-01-03 DIAGNOSIS — K860 Alcohol-induced chronic pancreatitis: Secondary | ICD-10-CM | POA: Diagnosis present

## 2015-01-04 ENCOUNTER — Telehealth: Payer: Self-pay | Admitting: Gastroenterology

## 2015-01-04 NOTE — Telephone Encounter (Signed)
Discussed with pt that if there are any meds he has to take in the am to take them as early in the am as he can with a small sip of water. Pt aware.

## 2015-01-05 ENCOUNTER — Ambulatory Visit (HOSPITAL_COMMUNITY)
Admission: RE | Admit: 2015-01-05 | Discharge: 2015-01-05 | Disposition: A | Discharge status: Home / Self Care | Payer: BLUE CROSS/BLUE SHIELD | Source: Ambulatory Visit | Attending: Gastroenterology | Admitting: Gastroenterology

## 2015-01-05 ENCOUNTER — Encounter (HOSPITAL_COMMUNITY): Payer: Self-pay | Admitting: *Deleted

## 2015-01-05 ENCOUNTER — Telehealth: Payer: Self-pay

## 2015-01-05 ENCOUNTER — Encounter (HOSPITAL_COMMUNITY)
Admission: RE | Disposition: A | Discharge status: Home / Self Care | Payer: Self-pay | Source: Ambulatory Visit | Attending: Gastroenterology

## 2015-01-05 ENCOUNTER — Ambulatory Visit (HOSPITAL_COMMUNITY): Payer: BLUE CROSS/BLUE SHIELD | Admitting: Anesthesiology

## 2015-01-05 DIAGNOSIS — F1010 Alcohol abuse, uncomplicated: Secondary | ICD-10-CM | POA: Diagnosis not present

## 2015-01-05 DIAGNOSIS — K860 Alcohol-induced chronic pancreatitis: Secondary | ICD-10-CM | POA: Insufficient documentation

## 2015-01-05 DIAGNOSIS — K766 Portal hypertension: Secondary | ICD-10-CM | POA: Diagnosis not present

## 2015-01-05 DIAGNOSIS — F17200 Nicotine dependence, unspecified, uncomplicated: Secondary | ICD-10-CM | POA: Diagnosis not present

## 2015-01-05 DIAGNOSIS — I8500 Esophageal varices without bleeding: Secondary | ICD-10-CM | POA: Insufficient documentation

## 2015-01-05 DIAGNOSIS — D124 Benign neoplasm of descending colon: Secondary | ICD-10-CM | POA: Insufficient documentation

## 2015-01-05 DIAGNOSIS — K922 Gastrointestinal hemorrhage, unspecified: Secondary | ICD-10-CM

## 2015-01-05 DIAGNOSIS — K750 Abscess of liver: Secondary | ICD-10-CM | POA: Insufficient documentation

## 2015-01-05 DIAGNOSIS — Z95828 Presence of other vascular implants and grafts: Secondary | ICD-10-CM | POA: Diagnosis not present

## 2015-01-05 DIAGNOSIS — D125 Benign neoplasm of sigmoid colon: Secondary | ICD-10-CM

## 2015-01-05 DIAGNOSIS — D649 Anemia, unspecified: Secondary | ICD-10-CM

## 2015-01-05 DIAGNOSIS — K921 Melena: Secondary | ICD-10-CM

## 2015-01-05 DIAGNOSIS — K3189 Other diseases of stomach and duodenum: Secondary | ICD-10-CM | POA: Diagnosis not present

## 2015-01-05 DIAGNOSIS — I10 Essential (primary) hypertension: Secondary | ICD-10-CM | POA: Diagnosis not present

## 2015-01-05 DIAGNOSIS — I81 Portal vein thrombosis: Secondary | ICD-10-CM | POA: Insufficient documentation

## 2015-01-05 HISTORY — PX: ESOPHAGOGASTRODUODENOSCOPY (EGD) WITH PROPOFOL: SHX5813

## 2015-01-05 HISTORY — DX: Pneumonia, unspecified organism: J18.9

## 2015-01-05 HISTORY — DX: Unspecified abdominal pain: R10.9

## 2015-01-05 HISTORY — DX: Anemia, unspecified: D64.9

## 2015-01-05 HISTORY — PX: COLONOSCOPY WITH PROPOFOL: SHX5780

## 2015-01-05 SURGERY — COLONOSCOPY WITH PROPOFOL
Anesthesia: Monitor Anesthesia Care

## 2015-01-05 MED ORDER — PROPOFOL 500 MG/50ML IV EMUL
INTRAVENOUS | Status: DC | PRN
  Administered 2015-01-05 (×3): 40 mg via INTRAVENOUS

## 2015-01-05 MED ORDER — KETAMINE HCL 10 MG/ML IJ SOLN
INTRAMUSCULAR | Status: DC | PRN
  Administered 2015-01-05: 30 mg via INTRAVENOUS

## 2015-01-05 MED ORDER — PROPOFOL 500 MG/50ML IV EMUL
INTRAVENOUS | Status: DC | PRN
  Administered 2015-01-05: 120 ug/kg/min via INTRAVENOUS

## 2015-01-05 MED ORDER — LACTATED RINGERS IV SOLN
INTRAVENOUS | Status: DC
  Administered 2015-01-05: 1000 mL via INTRAVENOUS

## 2015-01-05 MED ORDER — SODIUM CHLORIDE 0.9 % IV SOLN: INTRAVENOUS | Status: DC

## 2015-01-05 MED ORDER — LIDOCAINE HCL (CARDIAC) 20 MG/ML IV SOLN
INTRAVENOUS | Status: AC
  Filled 2015-01-05: qty 5

## 2015-01-05 MED ORDER — PROPOFOL 10 MG/ML IV BOLUS
INTRAVENOUS | Status: AC
Start: 2015-01-05 — End: 2015-01-05
  Filled 2015-01-05: qty 20

## 2015-01-05 MED ORDER — KETAMINE HCL 10 MG/ML IJ SOLN
INTRAMUSCULAR | Status: AC
  Filled 2015-01-05: qty 1

## 2015-01-05 MED ORDER — NADOLOL 20 MG PO TABS: 20.0000 mg | ORAL_TABLET | Freq: Every day | ORAL | Status: DC

## 2015-01-05 SURGICAL SUPPLY — 24 items

## 2015-01-05 NOTE — Anesthesia Postprocedure Evaluation (Signed)
  Anesthesia Post-op Note  Patient: Lawrence Brock  Procedure(s) Performed: Procedure(s) (LRB): COLONOSCOPY WITH PROPOFOL (N/A) ESOPHAGOGASTRODUODENOSCOPY (EGD) WITH PROPOFOL (N/A)  Patient Location: PACU  Anesthesia Type: MAC  Level of Consciousness: awake and alert   Airway and Oxygen Therapy: Patient Spontanous Breathing  Post-op Pain: mild  Post-op Assessment: Post-op Vital signs reviewed, Patient's Cardiovascular Status Stable, Respiratory Function Stable, Patent Airway and No signs of Nausea or vomiting  Last Vitals:  135/91, 70, 22  Post-op Vital Signs: stable   Complications: No apparent anesthesia complications

## 2015-01-05 NOTE — H&P (View-Only) (Signed)
12/29/2014 Lawrence Brock [id number] 1970/08/22   History of Present Illness:  See Dr. Eugenia Pancoast note from 03/29/2014 for details regarding patient's complex medical problems.  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  Was referred by Dr. Eugenia Pancoast at [facility] by Dr. [name] and was seen there in April at which time observation was recommended, but he never followed up again as directed.  He is on Zenpep pancreatic enzymes.  Most recent LFT's normal except a slightly elevated total bili of 1.8.  Continues to complain of abdominal pain.  Patient patient comes in today at the request of his PCP due to complaints of both black stools as well as red blood on occasion.  Had been taking Pepto-Bismol, but even since discontinuing that a couple of weeks ago his stools are very dark.  He still has frequent stools, especially after eating.  Bowels make a lot of noise.  He does have varices by CT scan.  Hgb had been improved with most recent labs a few weeks ago; Hgb 11.2.  Platelets 42.    Was using ETOH again until about 50 days ago.  Recently returned home from rehab.  Patient is tearful and asking if he will ever be back to normal again.   Current Medications, Allergies, Past Medical History, Past Surgical History, Family History and Social History were reviewed in [employer] record.   Physical Exam: BP 120/82 mmHg  Pulse 80  Ht 5\' 8"  (1.727 m)  Wt 144 lb 4 oz (65.431 kg)  BMI 21.94 kg/m2 General: Well developed male in no acute distress; tearful. Head: Normocephalic and atraumatic Eyes:  Sclerae anicteric, conjunctiva pink  Ears: Normal auditory acuity Lungs: Clear throughout to auscultation Heart: Regular rate and rhythm Abdomen: Soft, non-distended.  Hyperactive bowel sounds.  Diffuse TTP  > in upper abdomen. Musculoskeletal: Symmetrical with no gross deformities  Extremities: No edema  Neurological: Alert oriented x 4, grossly non-focal Psychological:  Alert and cooperative. 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  Was referred by Dr. Eugenia Pancoast at [facility] by Dr. [name] and needs to follow-up with him regarding these issues.  Will check CBC, CMP, and PT/INR today.  Is on Zenpep pancreatic enzymes. -Gastrointestinal bleeding:  Patient describes both black stools as well as red blood on occasion.  He does have varices by CT scan.  Will perform both EGD and colonoscopy at [facility] hospital to evaluate bleeding.  The risks, benefits, and alternatives to EGD and colonoscopy were discussed with the patient and he consents to proceed. Will check CBC again today as stated above, but Hgb had been improved with most recent labs a few weeks ago. -ETOH abuse:  Was using ETOH again until about 50 days ago.  Recently returned home from rehab.  *Patient asking for note to be out of work for another month until he can see when he can get another appt with Dr. Eugenia Pancoast.  He is out on short-term disability, which goes month to month apparently.  They are asking for an anticipated return to work date, however, I told patient that I am not sure when that will be.  Informed him that he needs to  follow-up with Dr. Eugenia Pancoast again to make decisions regarding care to have a better idea.  He reports that he tried to go back to work but had pain, dizziness, and extreme fatigue.  Works 12 hour shifts.    **45 minutes spent with patient over 50% in counseling and discussion of options and situation.

## 2015-01-05 NOTE — Telephone Encounter (Signed)
Pt has been notified that the paperwork was received but it was unreadable.  He was advised that we requested additional fax so that the paperwork can be read.  He will also call as well

## 2015-01-05 NOTE — Op Note (Signed)
[facility] [place], [address]   ENDOSCOPY PROCEDURE REPORT  PATIENT: Lawrence Brock, Lawrence Brock  MR#: [id number] BIRTHDATE: 1970/05/13 , 47  yrs. old GENDER: male ENDOSCOPIST: Milus Banister, MD PROCEDURE DATE:  01/05/2015 PROCEDURE:  EGD, diagnostic ASA CLASS:     Class III INDICATIONS:  melena, known recannalized portal vein, alcholic with severe calcific chronic pancreatitis, NOT overt cirrhosis on imaging.Marland Kitchen MEDICATIONS: Monitored anesthesia care TOPICAL ANESTHETIC: none  DESCRIPTION OF PROCEDURE: After the risks benefits and alternatives of the procedure were thoroughly explained, informed consent was obtained.  The Pentax [id number] 3.8 [id number] endoscope was introduced through the mouth and advanced to the second portion of the duodenum , Without limitations.  The instrument was slowly withdrawn as the mucosa was fully examined.  There was no blood in the UGI tract.  There were large distal esophagus varices without signs of recent bleeding.  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  There were no gastric varices. Retroflexed views revealed no abnormalities.     The scope was then withdrawn from the patient and the procedure completed. COMPLICATIONS: There were no immediate complications.  ENDOSCOPIC IMPRESSION: There was no blood in the UGI tract.  There were large distal esophagus varices without signs of recent bleeding.  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  There were no gastric varices  RECOMMENDATIONS: Will start nadolol 20mg  once daily and will titrate as high as he will tolerate for primary prophylaxis for variceal bleeding. My office will call to coordinate BP, HR check in 10 days.  He will need return appt with me in 6-8 weeks.   eSigned:  Milus Banister, MD 01/05/2015 12:16 PM

## 2015-01-05 NOTE — Telephone Encounter (Signed)
Pt has been scheduled for BP check and HR check as well as ROV

## 2015-01-05 NOTE — Anesthesia Preprocedure Evaluation (Addendum)
Anesthesia Evaluation  Patient identified by MRN, date of birth, ID band Patient awake    Reviewed: Allergy & Precautions, NPO status , Patient's Chart, lab work & pertinent test results  Airway Mallampati: II  TM Distance: >3 FB Neck ROM: Full    Dental no notable dental hx.    Pulmonary pneumonia, resolved, Current Smoker,    Pulmonary exam normal breath sounds clear to auscultation       Cardiovascular Exercise Tolerance: Good hypertension, Normal cardiovascular exam Rhythm:Regular Rate:Normal     Neuro/Psych negative neurological ROS  negative psych ROS   GI/Hepatic Neg liver ROS, PUD,   Endo/Other  negative endocrine ROS  Renal/GU negative Renal ROS  negative genitourinary   Musculoskeletal negative musculoskeletal ROS (+)   Abdominal   Peds negative pediatric ROS (+)  Hematology  (+) anemia ,   Anesthesia Other Findings   Reproductive/Obstetrics negative OB ROS                            Anesthesia Physical Anesthesia Plan  ASA: II  Anesthesia Plan: MAC   Post-op Pain Management:    Induction: Intravenous  Airway Management Planned: Natural Airway  Additional Equipment:   Intra-op Plan:   Post-operative Plan:   Informed Consent: I have reviewed the patients History and Physical, chart, labs and discussed the procedure including the risks, benefits and alternatives for the proposed anesthesia with the patient or authorized representative who has indicated his/her understanding and acceptance.   Dental advisory given  Plan Discussed with: CRNA  Anesthesia Plan Comments:         Anesthesia Quick Evaluation

## 2015-01-05 NOTE — Discharge Instructions (Signed)
Colonoscopy, Care After °Refer to this sheet in the next few weeks. These instructions provide you with information on caring for yourself after your procedure. Your health care provider may also give you more specific instructions. Your treatment has been planned according to current medical practices, but problems sometimes occur. Call your health care provider if you have any problems or questions after your procedure. °WHAT TO EXPECT AFTER THE PROCEDURE  °After your procedure, it is typical to have the following: °· A small amount of blood in your stool. °· Moderate amounts of gas and mild abdominal cramping or bloating. °HOME CARE INSTRUCTIONS °· Do not drive, operate machinery, or sign important documents for 24 hours. °· You may shower and resume your regular physical activities, but move at a slower pace for the first 24 hours. °· Take frequent rest periods for the first 24 hours. °· Walk around or put a warm pack on your abdomen to help reduce abdominal cramping and bloating. °· Drink enough fluids to keep your urine clear or pale yellow. °· You may resume your normal diet as instructed by your health care provider. Avoid heavy or fried foods that are hard to digest. °· Avoid drinking alcohol for 24 hours or as instructed by your health care provider. °· Only take over-the-counter or prescription medicines as directed by your health care provider. °· If a tissue sample (biopsy) was taken during your procedure: °¨ Do not take aspirin or blood thinners for 7 days, or as instructed by your health care provider. °¨ Do not drink alcohol for 7 days, or as instructed by your health care provider. °¨ Eat soft foods for the first 24 hours. °SEEK MEDICAL CARE IF: °You have persistent spotting of blood in your stool 2-3 days after the procedure. °SEEK IMMEDIATE MEDICAL CARE IF: °· You have more than a small spotting of blood in your stool. °· You pass large blood clots in your stool. °· Your abdomen is swollen  (distended). °· You have nausea or vomiting. °· You have a fever. °· You have increasing abdominal pain that is not relieved with medicine. °  °This information is not intended to replace advice given to you by your health care provider. Make sure you discuss any questions you have with your health care provider. °  °Document Released: 10/24/2003 Document Revised: 12/30/2012 Document Reviewed: 11/16/2012 °Elsevier Interactive Patient Education ©2016 [employer] ° ° °Esophagogastroduodenoscopy, Care After °Refer to this sheet in the next few weeks. These instructions provide you with information about caring for yourself after your procedure. Your health care provider may also give you more specific instructions. Your treatment has been planned according to current medical practices, but problems sometimes occur. Call your health care provider if you have any problems or questions after your procedure. °WHAT TO EXPECT AFTER THE PROCEDURE °After your procedure, it is typical to feel: °· Soreness in your throat. °· Pain with swallowing. °· Sick to your stomach (nauseous). °· Bloated. °· Dizzy. °· Fatigued. °HOME CARE INSTRUCTIONS °· Do not eat or drink anything until the numbing medicine (local anesthetic) has worn off and your gag reflex has returned. You will know that the local anesthetic has worn off when you can swallow comfortably. °· Do not drive or operate machinery until directed by your health care provider. °· Take medicines only as directed by your health care provider. °SEEK MEDICAL CARE IF:  °· You cannot stop coughing. °· You are not urinating at all or less than usual. °SEEK   IMMEDIATE MEDICAL CARE IF: °· You have difficulty swallowing. °· You cannot eat or drink. °· You have worsening throat or chest pain. °· You have dizziness or lightheadedness or you faint. °· You have nausea or vomiting. °· You have chills. °· You have a fever. °· You have severe abdominal pain. °· You have black, tarry, or bloody  stools. °  °This information is not intended to replace advice given to you by your health care provider. Make sure you discuss any questions you have with your health care provider. °  °Document Released: 02/26/2012 Document Revised: 04/01/2014 Document Reviewed: 02/26/2012 °Elsevier Interactive Patient Education ©2016 [employer] ° °

## 2015-01-05 NOTE — Transfer of Care (Signed)
Immediate Anesthesia Transfer of Care Note  Patient: Lawrence Brock  Procedure(s) Performed: Procedure(s): COLONOSCOPY WITH PROPOFOL (N/A) ESOPHAGOGASTRODUODENOSCOPY (EGD) WITH PROPOFOL (N/A)  Patient Location: PACU  Anesthesia Type:MAC  Level of Consciousness: awake, alert  and oriented  Airway & Oxygen Therapy: Patient Spontanous Breathing and Patient connected to nasal cannula oxygen  Post-op Assessment: Report given to RN and Post -op Vital signs reviewed and stable  Post vital signs: Reviewed and stable  Last Vitals:  Filed Vitals:   01/05/15 0857  BP: 141/105  Pulse: 19  Temp: 36.4 C  Resp: 18    Complications: No apparent anesthesia complications

## 2015-01-05 NOTE — Interval H&P Note (Signed)
History and Physical Interval Note:  01/05/2015 10:12 AM  Lawrence Brock  has presented today for surgery, with the diagnosis of anemia GI bleed cirrhosis  The various methods of treatment have been discussed with the patient and family. After consideration of risks, benefits and other options for treatment, the patient has consented to  Procedure(s): COLONOSCOPY WITH PROPOFOL (N/A) ESOPHAGOGASTRODUODENOSCOPY (EGD) WITH PROPOFOL (N/A) as a surgical intervention .  The patient's history has been reviewed, patient examined, no change in status, stable for surgery.  I have reviewed the patient's chart and labs.  Questions were answered to the patient's satisfaction.     Milus Banister

## 2015-01-05 NOTE — Telephone Encounter (Signed)
-----   Message from Milus Banister, MD sent at 01/05/2015 12:18 PM EDT ----- He needs visit in 10 days for BP, HR check in order to titrate his nadolol that I am starting today.  He needs rov with me in 6-8 weeks.  Thanks

## 2015-01-05 NOTE — Op Note (Signed)
[facility] [place], [address]   COLONOSCOPY PROCEDURE REPORT  PATIENT: Lawrence Brock, Lawrence Brock  MR#: [id number] BIRTHDATE: 1971/02/17 , 38  yrs. old GENDER: male ENDOSCOPIST: Milus Banister, MD PROCEDURE DATE:  01/05/2015 PROCEDURE:   Colonoscopy, diagnostic and Colonoscopy with snare polypectomy First Screening Colonoscopy - Avg.  risk and is 50 yrs.  old or older - No.  Prior Negative Screening - Now for repeat screening. N/A  History of Adenoma - Now for follow-up colonoscopy & has been > or = to 3 yrs.  N/A  Polyps removed today? Yes ASA CLASS:   Class III INDICATIONS:minor rectal bleeding. MEDICATIONS: Monitored anesthesia care  DESCRIPTION OF PROCEDURE:   After the risks benefits and alternatives of the procedure were thoroughly explained, informed consent was obtained.  The digital rectal exam revealed no abnormalities of the rectum.   The Pentax Ped Colon [id number] endoscope was introduced through the anus and advanced to the cecum, which was identified by both the appendix and ileocecal valve. No adverse events experienced.   The quality of the prep was excellent.  The instrument was then slowly withdrawn as the colon was fully examined. Estimated blood loss is zero unless otherwise noted in this procedure report.   COLON FINDINGS: Three semipedunculated polyps were found, removed and sent to pathology.  These were located in descending and sigmoid segment.  They ranged in size from 85mm to 66mm, all were removed with cold snare (path jar 1).  There were medium sized internal/external hemorrhoids without thrombosis. The examination was otherwise normal..  Retroflexed views revealed no abnormalities. The time to cecum = 3 min Withdrawal time = 10 min The scope was withdrawn and the procedure completed. COMPLICATIONS: There were no immediate complications.  ENDOSCOPIC IMPRESSION: Three semipedunculated polyps were found, removed and sent  to pathology.  These were located in descending and sigmoid segment. They ranged in size from 45mm to 62mm, all were removed with cold snare (path jar 1).  There were medium sized internal/external hemorrhoids without thrombosis  RECOMMENDATIONS: If the polyp(s) removed today are proven to be adenomatous (pre-cancerous) polyps, you will need a colonoscopy in 3 years. Otherwise you should continue to follow colorectal cancer screening guidelines for "routine risk" patients with a colonoscopy in 10 years.  You will receive a letter within 1-2 weeks with the results of your biopsy as well as final recommendations.  Please call my office if you have not received a letter after 3 weeks.  eSigned:  Milus Banister, MD 01/05/2015 12:04 PM

## 2015-01-06 ENCOUNTER — Encounter (HOSPITAL_COMMUNITY): Payer: Self-pay | Admitting: Gastroenterology

## 2015-01-16 ENCOUNTER — Telehealth: Payer: Self-pay | Admitting: Gastroenterology

## 2015-01-16 ENCOUNTER — Encounter: Payer: BLUE CROSS/BLUE SHIELD | Admitting: Gastroenterology

## 2015-01-16 VITALS — BP 110/80 | HR 72

## 2015-01-16 DIAGNOSIS — K860 Alcohol-induced chronic pancreatitis: Secondary | ICD-10-CM

## 2015-01-16 MED ORDER — NADOLOL 40 MG PO TABS: 40.0000 mg | ORAL_TABLET | Freq: Every day | ORAL | Status: DC

## 2015-01-16 NOTE — Telephone Encounter (Signed)
HR was 72, BP 110/80   Please have him increase his nadolol to 40mg  pill, once daily.  He will probably need a new script written.   He needs repeat RN visit in 2 weeks to check his HR/BP again as we titrate up the nadolo (goal is HR in 50s without significant hypotension)

## 2015-01-16 NOTE — Telephone Encounter (Signed)
I have spoken to patient to advise of Dr [name]' recommendations. He verbalizes understanding. He will come for BP recheck on 01/30/15 @ 8:30 am. Rx for nadolol 40 mg has been sent to patient's pharmacy.

## 2015-01-30 ENCOUNTER — Ambulatory Visit (INDEPENDENT_AMBULATORY_CARE_PROVIDER_SITE_OTHER): Payer: Self-pay | Admitting: Gastroenterology

## 2015-01-30 ENCOUNTER — Telehealth: Payer: Self-pay

## 2015-01-30 VITALS — BP 118/92 | HR 73

## 2015-01-30 DIAGNOSIS — Z0130 Encounter for examination of blood pressure without abnormal findings: Secondary | ICD-10-CM

## 2015-01-30 MED ORDER — NADOLOL 20 MG PO TABS: ORAL_TABLET | ORAL | Status: DC

## 2015-01-30 NOTE — Telephone Encounter (Signed)
-----   Message from Milus Banister, MD sent at 01/30/2015  8:26 AM EST ----- Regarding: RE: BP and Pulse Thanks, he needs to have increase in nadolol to 60mg  once daily, will probably need new script written. He needs to return for bp, HR check in 10 days again.  Thanks   ----- Message -----    From: Margreta Journey, CMA    Sent: 01/30/2015   8:14 AM      To: Milus Banister, MD Subject: BP and Pulse                                   Patient's BP was 118/92 and Pulse was 73. This was after he sat for 10 minutes.

## 2015-01-30 NOTE — Telephone Encounter (Signed)
Called pt and informed him that I have called in a new Rx for Nadolol 20mg  and he is to take 3 once a day. He is also set to come back November 17th for a BP and Pulse check.

## 2015-02-20 ENCOUNTER — Encounter (HOSPITAL_COMMUNITY): Payer: Self-pay | Admitting: Emergency Medicine

## 2015-02-20 ENCOUNTER — Emergency Department (HOSPITAL_COMMUNITY)
Admission: EM | Admit: 2015-02-20 | Discharge: 2015-02-20 | Disposition: A | Discharge status: Home / Self Care | Payer: BLUE CROSS/BLUE SHIELD

## 2015-02-20 NOTE — ED Notes (Signed)
Patient is having abdominal pain above the belly button on both sides. Patient went to PCP this morning but the md did not do anything for him per patient.

## 2015-02-20 NOTE — ED Notes (Signed)
Patient stated he want to go to [facility] because it is taking to long.

## 2015-03-07 ENCOUNTER — Ambulatory Visit: Payer: BLUE CROSS/BLUE SHIELD | Admitting: Gastroenterology

## 2015-06-02 IMAGING — CT CT ABD-PELV W/ CM
1 of 4 series · 12 of 32 positions shown, 17 images · IV contrast (omnipaque)
Comparison: 02/16/2014.

CLINICAL DATA: Intense abdominal pain and nausea since this
morning. History of pancreatitis and recent pancreatic stent
placement.

EXAM:
CT ABDOMEN AND PELVIS WITH CONTRAST
TECHNIQUE: Multidetector CT imaging of the abdomen and pelvis was performed
using the standard protocol following bolus administration of
intravenous contrast.
CONTRAST:  50mL OMNIPAQUE IOHEXOL 300 MG/ML SOLN, 100mL OMNIPAQUE
IOHEXOL 300 MG/ML SOLN

[Series 2: abd/pel with · axial · 0.77mm/px · z∈[-460,-50]mm · 12 of 94 slices shown, 17 images]
[im 6/94  soft-tissue]
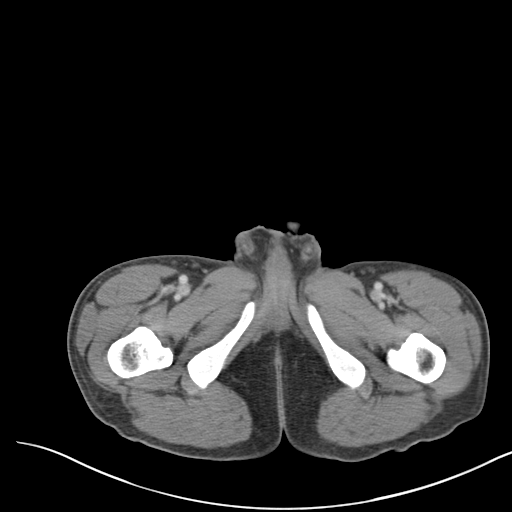
[im 6/94  bone]
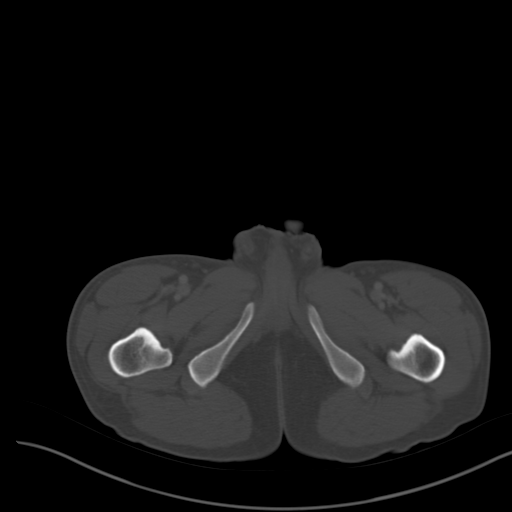
[im 16/94  soft-tissue]
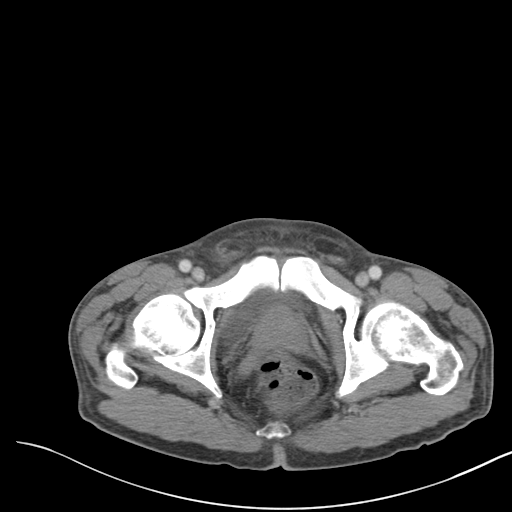
[im 21/94  soft-tissue]
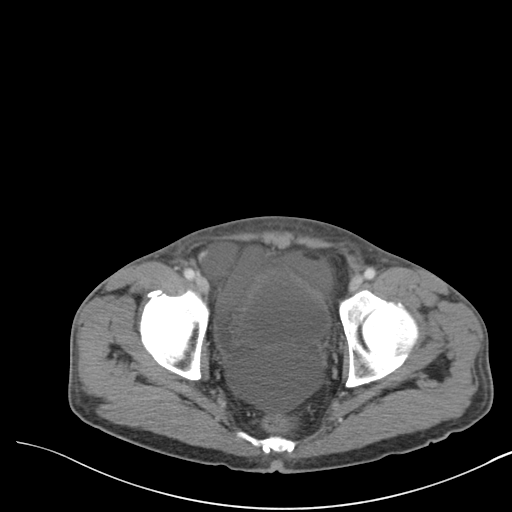
[im 32/94  soft-tissue]
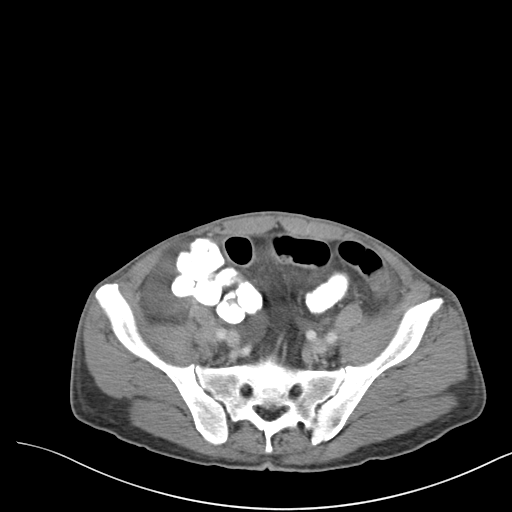
[im 37/94  soft-tissue]
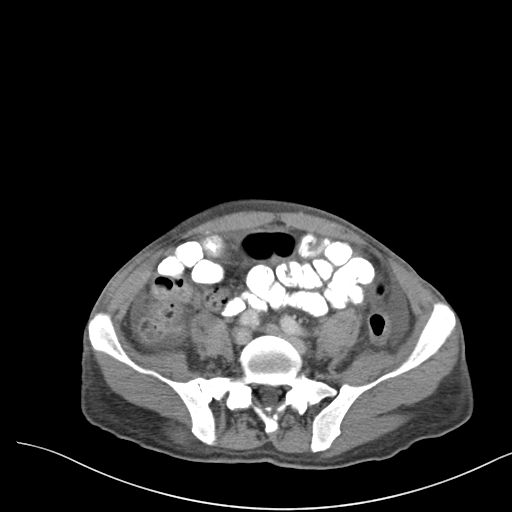
[im 47/94  soft-tissue]
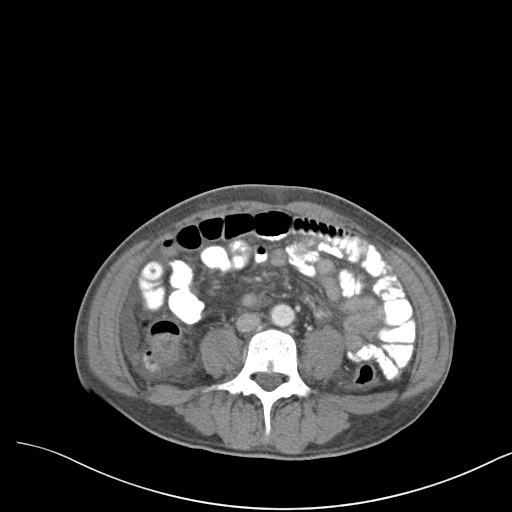
[im 57/94  soft-tissue]
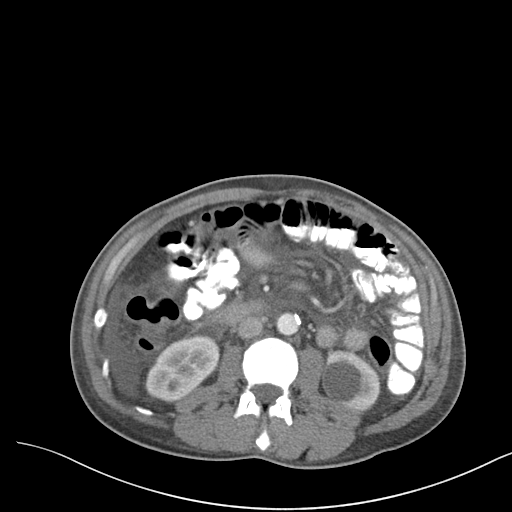
[im 63/94  soft-tissue]
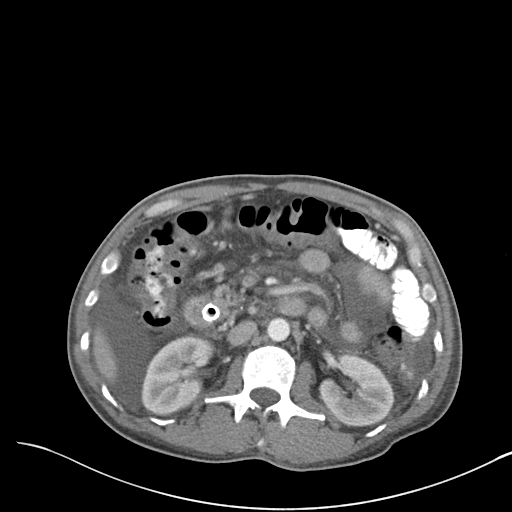
[im 73/94  soft-tissue]
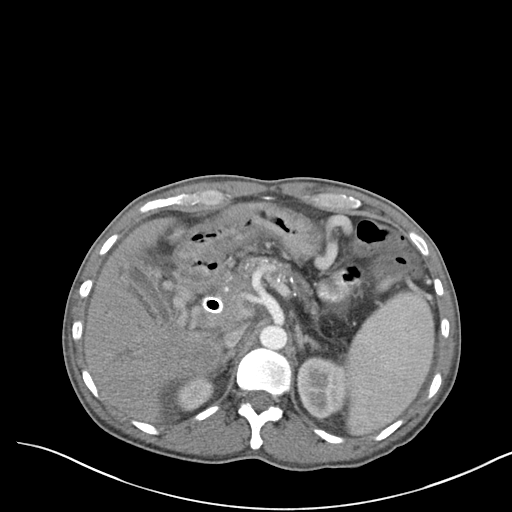
[im 73/94  lung]
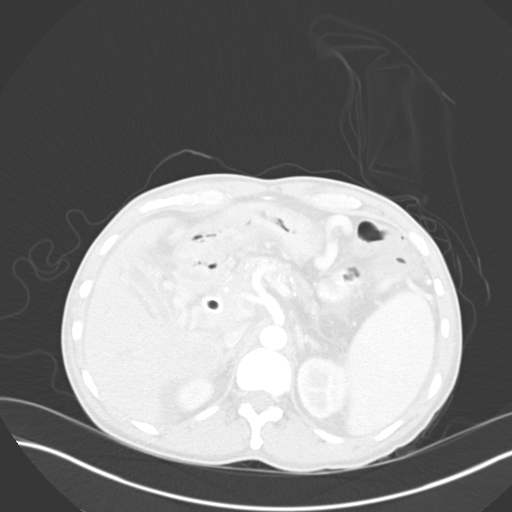
[im 73/94  bone]
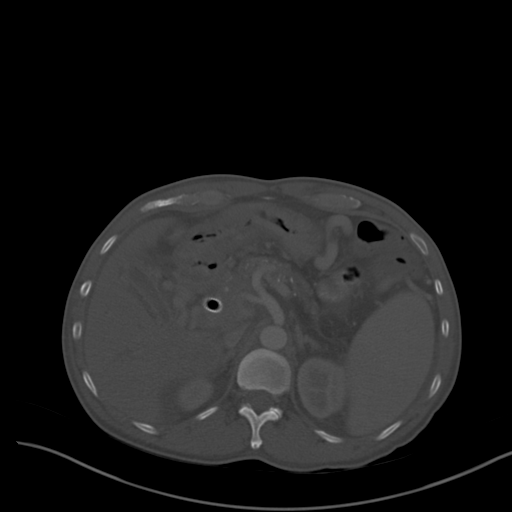
[im 78/94  soft-tissue]
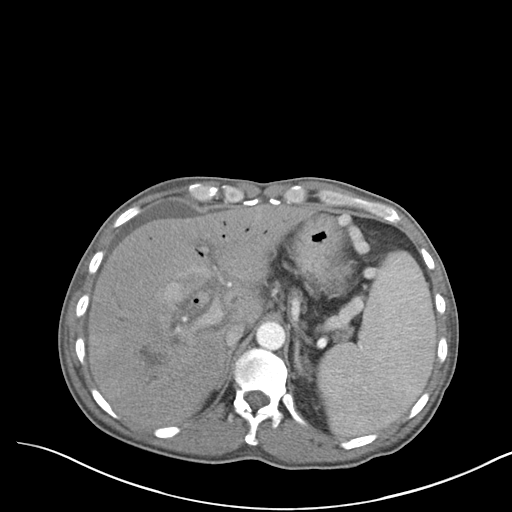
[im 78/94  lung]
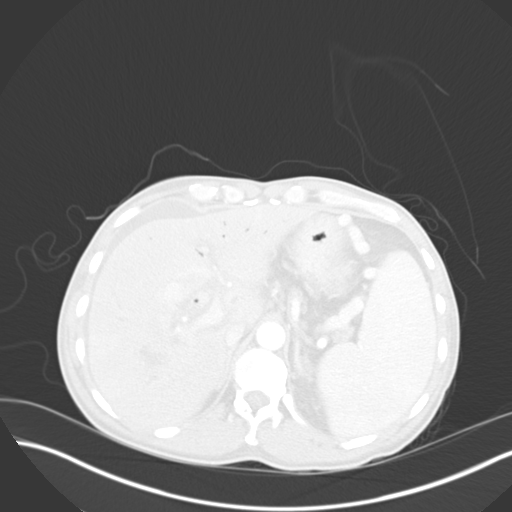
[im 83/94  lung]
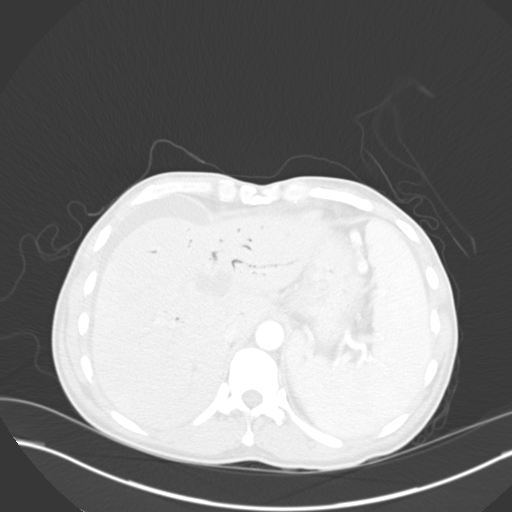
[im 88/94  soft-tissue]
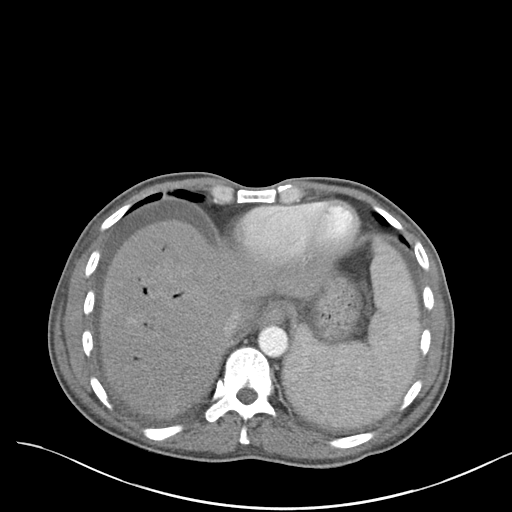
[im 88/94  lung]
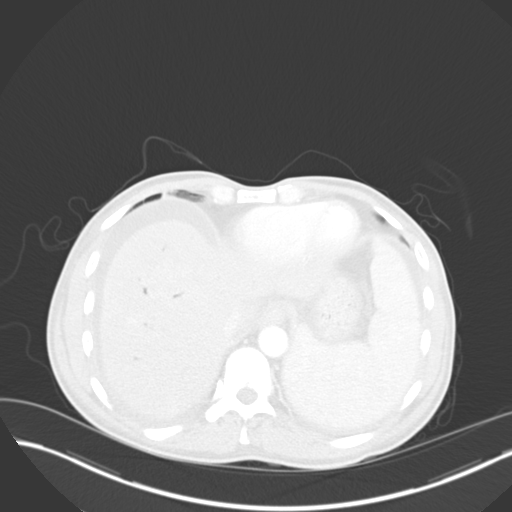

[12 of 32 positions shown; findings below may reference images not displayed]

FINDINGS: Lower chest: Lung bases show small to moderate bilateral pleural
effusions with compressive atelectasis in both lower lobes. Heart
size normal. No pericardial effusion.

Hepatobiliary: There is new pneumobilia related to interval
placement of a metallic wall stent in the extrahepatic bile duct,
terminating in the duodenum just past the ampulla. Intrahepatic
biliary duct dilatation persists on the right. Hepatic parenchyma is
heterogeneous. There are complex collections of low-attenuation with
lobulated peripheral hyper attenuation in the liver, measuring up to
2.9 x 3.5 cm in the left hepatic lobe (series 2, image 11),
previously approximately 3.9 x 5.2 cm. Previously seen extensive
heterogeneous low-attenuation in the porta hepatis has decreased
somewhat in the interval. Cavernous transformation of the portal
vein is again noted. Gallbladder is decompressed.

Pancreas: Calcifications are seen in the pancreas. Duct measures up
to 6 mm, as before. There may be an intraductal stone within the
head of the pancreas (series 2, image 25).

Spleen: Measures approximately 15.4 x 5.5 x 16.7 cm (volume 707
cubic cm). Otherwise unremarkable.

Adrenals/Urinary Tract: Adrenal glands are unremarkable.
Low-attenuation lesions in the kidneys measure up to 3.3 cm on the
left and are likely cysts. Ureters are decompressed. Bladder is
unremarkable.

Stomach/Bowel: Stomach and small bowel are unremarkable. There may
be slight wall thickening involving the ascending and transverse
colon. Colon is otherwise unremarkable.

Vascular/Lymphatic: Atherosclerotic calcification of the arterial
vasculature without abdominal aortic aneurysm. Cavernous
transformation of the portal vein, as mentioned above. Dilated
varices in the upper abdomen. Scattered lymph nodes in the abdomen
do not appear enlarged by CT size criteria.

Reproductive: Prostate is normal in size.

Other: Moderate ascites.

Musculoskeletal:  No worrisome lytic or sclerotic lesions.
IMPRESSION: 1. Interval placement of a metallic wall stent in the extra hepatic
bile duct with improvement in porta hepatis low-attenuation and
presumed hepatic abscesses in this patient with a working diagnosis
of ascending cholangitis.
2. Chronic calcific pancreatitis with ductal dilatation. Question
obstructing stone in the pancreatic head.
3. Cavernous transformation of the portal vein with dilated
abdominal varices and splenomegaly.
4. Moderate ascites and small to moderate bilateral effusions with
compressive atelectasis in the lower lobes.
5. Question slight wall thickening involving the ascending and
transverse colon, suggesting an infectious or inflammatory colitis.

## 2015-06-14 ENCOUNTER — Telehealth: Payer: Self-pay | Admitting: Gastroenterology

## 2015-06-14 NOTE — Telephone Encounter (Signed)
[name] was advised to call his PCP regarding the leg swelling and pain.  He agreed and also asked to set up a follow up with Dr [name].  Out schedule is not out yet so [name] was asked to call back in a few days and we could get him set up to see Dr [name].

## 2015-06-26 ENCOUNTER — Encounter (HOSPITAL_COMMUNITY): Payer: Self-pay | Admitting: Emergency Medicine

## 2015-06-26 ENCOUNTER — Inpatient Hospital Stay (HOSPITAL_COMMUNITY)
Admission: EM | Admit: 2015-06-26 | Discharge: 2015-06-27 | DRG: 808 | Disposition: A | Discharge status: Home / Self Care | Payer: BLUE CROSS/BLUE SHIELD | Attending: Internal Medicine | Admitting: Internal Medicine

## 2015-06-26 ENCOUNTER — Emergency Department (HOSPITAL_COMMUNITY): Payer: BLUE CROSS/BLUE SHIELD

## 2015-06-26 DIAGNOSIS — D509 Iron deficiency anemia, unspecified: Secondary | ICD-10-CM | POA: Insufficient documentation

## 2015-06-26 DIAGNOSIS — D61818 Other pancytopenia: Secondary | ICD-10-CM | POA: Diagnosis present

## 2015-06-26 DIAGNOSIS — D649 Anemia, unspecified: Secondary | ICD-10-CM | POA: Diagnosis present

## 2015-06-26 DIAGNOSIS — Z8249 Family history of ischemic heart disease and other diseases of the circulatory system: Secondary | ICD-10-CM | POA: Diagnosis not present

## 2015-06-26 DIAGNOSIS — D500 Iron deficiency anemia secondary to blood loss (chronic): Secondary | ICD-10-CM | POA: Diagnosis not present

## 2015-06-26 DIAGNOSIS — K7030 Alcoholic cirrhosis of liver without ascites: Secondary | ICD-10-CM | POA: Diagnosis not present

## 2015-06-26 DIAGNOSIS — K861 Other chronic pancreatitis: Secondary | ICD-10-CM | POA: Diagnosis present

## 2015-06-26 DIAGNOSIS — D638 Anemia in other chronic diseases classified elsewhere: Secondary | ICD-10-CM | POA: Diagnosis present

## 2015-06-26 DIAGNOSIS — F17210 Nicotine dependence, cigarettes, uncomplicated: Secondary | ICD-10-CM | POA: Diagnosis present

## 2015-06-26 DIAGNOSIS — D731 Hypersplenism: Secondary | ICD-10-CM | POA: Diagnosis present

## 2015-06-26 DIAGNOSIS — K831 Obstruction of bile duct: Secondary | ICD-10-CM | POA: Diagnosis present

## 2015-06-26 DIAGNOSIS — Z9119 Patient's noncompliance with other medical treatment and regimen: Secondary | ICD-10-CM

## 2015-06-26 DIAGNOSIS — R0609 Other forms of dyspnea: Secondary | ICD-10-CM | POA: Diagnosis present

## 2015-06-26 LAB — COMPREHENSIVE METABOLIC PANEL
ALT: 26 U/L (ref 17–63)
AST: 24 U/L (ref 15–41)
Albumin: 3.8 g/dL (ref 3.5–5.0)
Alkaline Phosphatase: 61 U/L (ref 38–126)
Anion gap: 6 (ref 5–15)
BUN: 20 mg/dL (ref 6–20)
CO2: 23 mmol/L (ref 22–32)
Calcium: 8.6 mg/dL — ABNORMAL LOW (ref 8.9–10.3)
Chloride: 110 mmol/L (ref 101–111)
Creatinine, Ser: 0.9 mg/dL (ref 0.61–1.24)
GFR calc Af Amer: >60 mL/min (ref >60)
GFR calc non Af Amer: >60 mL/min (ref >60)
Glucose, Bld: 109 mg/dL — ABNORMAL HIGH (ref 65–99)
Potassium: 3.7 mmol/L (ref 3.5–5.1)
Sodium: 139 mmol/L (ref 135–145)
Total Bilirubin: 1.9 mg/dL — ABNORMAL HIGH (ref 0.3–1.2)
Total Protein: 7.2 g/dL (ref 6.5–8.1)

## 2015-06-26 LAB — DIFFERENTIAL
BASOS PCT: 1 %
Basophils Absolute: 0 10*3/uL (ref 0.0–0.1)
EOS ABS: 0.2 10*3/uL (ref 0.0–0.7)
Eosinophils Relative: 6 %
Lymphocytes Relative: 19 %
Lymphs Abs: 0.5 10*3/uL — ABNORMAL LOW (ref 0.7–4.0)
MONO ABS: 0.2 10*3/uL (ref 0.1–1.0)
Monocytes Relative: 6 %
NEUTROS ABS: 1.6 10*3/uL — AB (ref 1.7–7.7)
Neutrophils Relative %: 68 %

## 2015-06-26 LAB — RAPID URINE DRUG SCREEN, HOSP PERFORMED
Amphetamines: NOT DETECTED
Barbiturates: NOT DETECTED
Benzodiazepines: NOT DETECTED
Cocaine: NOT DETECTED
Opiates: NOT DETECTED
Tetrahydrocannabinol: NOT DETECTED

## 2015-06-26 LAB — PROTIME-INR
INR: 1.23 (ref 0.00–1.49)
Prothrombin Time: 15.6 seconds — ABNORMAL HIGH (ref 11.6–15.2)

## 2015-06-26 LAB — CBC
HCT: 23 % — ABNORMAL LOW (ref 39.0–52.0)
Hemoglobin: 6.2 g/dL — CL (ref 13.0–17.0)
MCH: 16.7 pg — AB (ref 26.0–34.0)
MCHC: 27 g/dL — ABNORMAL LOW (ref 30.0–36.0)
MCV: 62 fL — ABNORMAL LOW (ref 78.0–100.0)
PLATELETS: 41 10*3/uL — AB (ref 150–400)
RBC: 3.71 MIL/uL — AB (ref 4.22–5.81)
RDW: 18.5 % — ABNORMAL HIGH (ref 11.5–15.5)
WBC: 2.5 10*3/uL — AB (ref 4.0–10.5)

## 2015-06-26 LAB — FOLATE: FOLATE: 19.8 ng/mL (ref >5.9)

## 2015-06-26 LAB — VITAMIN B12: Vitamin B-12: 442 pg/mL (ref 180–914)

## 2015-06-26 LAB — SAVE SMEAR

## 2015-06-26 LAB — I-STAT TROPONIN, ED: TROPONIN I, POC: 0 ng/mL (ref 0.00–0.08)

## 2015-06-26 LAB — OCCULT BLOOD X 1 CARD TO LAB, STOOL: Fecal Occult Bld: NEGATIVE

## 2015-06-26 LAB — RETICULOCYTES
RBC.: 3.33 MIL/uL — ABNORMAL LOW (ref 4.22–5.81)
Retic Count, Absolute: 66.6 10*3/uL (ref 19.0–186.0)
Retic Ct Pct: 2 % (ref 0.4–3.1)

## 2015-06-26 LAB — PATHOLOGIST SMEAR REVIEW

## 2015-06-26 LAB — FERRITIN: Ferritin: 6 ng/mL — ABNORMAL LOW (ref 24–336)

## 2015-06-26 LAB — IRON AND TIBC
Iron: 9 ug/dL — ABNORMAL LOW (ref 45–182)
SATURATION RATIOS: 2 % — AB (ref 17.9–39.5)
TIBC: 459 ug/dL — ABNORMAL HIGH (ref 250–450)
UIBC: 450 ug/dL

## 2015-06-26 LAB — PREPARE RBC (CROSSMATCH)

## 2015-06-26 MED ORDER — PANTOPRAZOLE SODIUM 40 MG IV SOLR
40.0000 mg | Freq: Two times a day (BID) | INTRAVENOUS | Status: DC
  Administered 2015-06-26 – 2015-06-27 (×2): 40 mg via INTRAVENOUS
  Filled 2015-06-26 (×2): qty 40

## 2015-06-26 MED ORDER — ONDANSETRON HCL 4 MG/2ML IJ SOLN: 4.0000 mg | Freq: Four times a day (QID) | INTRAMUSCULAR | Status: DC | PRN

## 2015-06-26 MED ORDER — SODIUM CHLORIDE 0.9 % IV SOLN: 10.0000 mL/h | Freq: Once | INTRAVENOUS | Status: DC

## 2015-06-26 MED ORDER — FUROSEMIDE 10 MG/ML IJ SOLN
20.0000 mg | Freq: Once | INTRAMUSCULAR | Status: AC
  Administered 2015-06-26: 20 mg via INTRAVENOUS
  Filled 2015-06-26: qty 2

## 2015-06-26 MED ORDER — PANCRELIPASE (LIP-PROT-AMYL) 12000-38000 UNITS PO CPEP
3.0000 | ORAL_CAPSULE | Freq: Three times a day (TID) | ORAL | Status: DC
  Administered 2015-06-26 – 2015-06-27 (×3): 36000 [IU] via ORAL
  Filled 2015-06-26 (×5): qty 3

## 2015-06-26 MED ORDER — SPIRONOLACTONE 25 MG PO TABS
50.0000 mg | ORAL_TABLET | Freq: Every day | ORAL | Status: DC
  Administered 2015-06-26 – 2015-06-27 (×2): 50 mg via ORAL
  Filled 2015-06-26 (×2): qty 2

## 2015-06-26 MED ORDER — ALBUTEROL SULFATE (2.5 MG/3ML) 0.083% IN NEBU: 2.5000 mg | INHALATION_SOLUTION | RESPIRATORY_TRACT | Status: DC | PRN

## 2015-06-26 MED ORDER — OXYCODONE HCL 5 MG PO TABS
5.0000 mg | ORAL_TABLET | ORAL | Status: DC | PRN
  Administered 2015-06-26 – 2015-06-27 (×2): 5 mg via ORAL
  Filled 2015-06-26 (×2): qty 1

## 2015-06-26 MED ORDER — ONDANSETRON HCL 4 MG PO TABS: 4.0000 mg | ORAL_TABLET | Freq: Four times a day (QID) | ORAL | Status: DC | PRN

## 2015-06-26 MED ORDER — NADOLOL 20 MG PO TABS
20.0000 mg | ORAL_TABLET | Freq: Every day | ORAL | Status: DC
  Administered 2015-06-26 – 2015-06-27 (×2): 20 mg via ORAL
  Filled 2015-06-26 (×2): qty 1

## 2015-06-26 NOTE — ED Notes (Signed)
hospitalist at bedside

## 2015-06-26 NOTE — ED Provider Notes (Signed)
CSN: [id number]     Arrival date & time 06/26/15  1000 History   First MD Initiated Contact with Patient 06/26/15 1012     Chief Complaint  Patient presents with  . Shortness of Breath  . Low HgB   . Leg Swelling     (Consider location/radiation/quality/duration/timing/severity/associated sxs/prior Treatment) HPI   Pleasant 44yM with fatigue/exertional dyspnea/cold intolerance, etc. Pancytopenic on blood work 3/22. Hgb 6.2, plt 43,000, WBC 2.9. He says he has been called several times by PCP since then to seek further evaluation but ignored them up until today. He recently began new job and didn't want to jeopardize losing it. Today he finally sought evaluation because symptoms have continued to progress. Denies BRBP, melena, easy bruising, etc. 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 It does not appear he had a bone marrow biopsy or followed back up since that time. He also reports increasing swelling in b/l LE. Progresses as day goes on and improves but not completely resolves by the time he wakes up in the morning. Has had chronic pain in back, but no significant acute pain complaints.    Past Medical History  Diagnosis Date  . Pancreatitis   . Chronic back pain   . Chronic pancreatitis ([facility]) 09/10/2013  . Hypertension     No meds yet.  . Pneumonia     10-30-14 [place] admission, no problems now.  . Platelets decreased ([place])     to continue to be evaluated and followed by PCP  . Anemia   . Abdominal pain     intermittent abdominal pain in lower abdomen   Past Surgical History  Procedure Laterality Date  . Appendectomy    . Esophagogastroduodenoscopy (egd) with propofol N/A 09/14/2013    Procedure: ESOPHAGOGASTRODUODENOSCOPY (EGD) WITH PROPOFOL;  Surgeon: Jerene Bears, MD;  Location: [facility];  Service: Gastroenterology;  Laterality: N/A;  . Gastrointestinal stent removal N/A 09/14/2013   Procedure: GASTROINTESTINAL STENT REMOVAL;  Surgeon: Jerene Bears, MD;  Location: [facility];  Service: Gastroenterology;  Laterality: N/A;  pancreatic stent removal  . Ercp N/A 02/22/2014    Procedure: ENDOSCOPIC RETROGRADE CHOLANGIOPANCREATOGRAPHY (ERCP);  Surgeon: Ladene Artist, MD;  Location: [facility];  Service: Endoscopy;  Laterality: N/A;  . Ercp N/A 02/18/2014    Procedure: ENDOSCOPIC RETROGRADE CHOLANGIOPANCREATOGRAPHY (ERCP);  Surgeon: Inda Castle, MD;  Location: [facility];  Service: Gastroenterology;  Laterality: N/A;  . Endoscopic retrograde cholangiopancreatography (ercp) with propofol N/A 06/02/2014    Procedure: ENDOSCOPIC RETROGRADE CHOLANGIOPANCREATOGRAPHY (ERCP) WITH PROPOFOL;  Surgeon: Milus Banister, MD;  Location: [facility];  Service: Endoscopy;  Laterality: N/A;  . Colonoscopy with propofol N/A 01/05/2015    Procedure: COLONOSCOPY WITH PROPOFOL;  Surgeon: Milus Banister, MD;  Location: [facility];  Service: Endoscopy;  Laterality: N/A;  . Esophagogastroduodenoscopy (egd) with propofol N/A 01/05/2015    Procedure: ESOPHAGOGASTRODUODENOSCOPY (EGD) WITH PROPOFOL;  Surgeon: Milus Banister, MD;  Location: [facility];  Service: Endoscopy;  Laterality: N/A;   Family History  Problem Relation Age of Onset  . Hypertension Mother    Social History  Substance Use Topics  . Smoking status: Current Every Day Smoker -- 0.50 packs/day    Types: Cigarettes  . Smokeless tobacco: Never Used  . Alcohol Use: No     Comment: former ETOH abuse, quit a month ago per patient    Review of Systems  All systems  reviewed and negative, other than as noted in HPI.   Allergies  Review of patient's allergies indicates no known allergies.  Home Medications   Prior to Admission medications   Medication Sig Start Date End Date Taking? Authorizing Provider  nadolol (CORGARD) 20 MG tablet Take 2 tablets once a day. 01/30/15   Milus Banister, MD  oxycodone (OXY-IR) 5 MG capsule  Take 1 capsule (5 mg total) by mouth every 4 (four) hours as needed. Patient taking differently: Take 5 mg by mouth every 4 (four) hours as needed for pain.  96/2/95   Delora Fuel, MD  Pancrelipase, Lip-Prot-Amyl, (ZENPEP) 40000 UNITS CPEP Take 5 capsules by mouth 4 (four) times daily.    Historical Provider, MD  pantoprazole (PROTONIX) 20 MG tablet Take 20 mg by mouth daily.    Historical Provider, MD   BP 127/97 mmHg  Pulse 71  Temp(Src) 97.3 F (36.3 C) (Oral)  Resp 20  SpO2 100% Physical Exam  Constitutional: He appears well-developed and well-nourished. No distress.  Sitting in bed. NAD.   HENT:  Head: Normocephalic and atraumatic.  Eyes: Right eye exhibits no discharge. Left eye exhibits no discharge.  Pale conjunctiva.   Neck: Neck supple.  Cardiovascular: Normal rate, regular rhythm and normal heart sounds.  Exam reveals no gallop and no friction rub.   No murmur heard. Pulmonary/Chest: Effort normal and breath sounds normal. No respiratory distress.  Abdominal: Soft. He exhibits no distension. There is no tenderness.  Musculoskeletal: He exhibits edema. He exhibits no tenderness.  Neurological: He is alert.  Skin: Skin is warm and dry.  Psychiatric: He has a normal mood and affect. His behavior is normal. Thought content normal.  Nursing note and vitals reviewed.   ED Course  Procedures (including critical care time)  CRITICAL CARE Performed by: Virgel Manifold Total critical care time: 35 minutes, re: symptomatic anemia requiring PRBC transfusion Critical care time was exclusive of separately billable procedures and treating other patients. Critical care was necessary to treat or prevent imminent or life-threatening deterioration. Critical care was time spent personally by me on the following activities: development of treatment plan with patient and/or surrogate as well as nursing, discussions with consultants, evaluation of patient's response to treatment, examination  of patient, obtaining history from patient or surrogate, ordering and performing treatments and interventions, ordering and review of laboratory studies, ordering and review of radiographic studies, pulse oximetry and re-evaluation of patient's condition.  Labs Review Labs Reviewed  CBC - Abnormal; Notable for the following:    WBC 2.5 (*)    RBC 3.71 (*)    Hemoglobin 6.2 (*)    HCT 23.0 (*)    MCV 62.0 (*)    MCH 16.7 (*)    MCHC 27.0 (*)    RDW 18.5 (*)    Platelets 41 (*)    All other components within normal limits  PROTIME-INR - Abnormal; Notable for the following:    Prothrombin Time 15.6 (*)    All other components within normal limits  COMPREHENSIVE METABOLIC PANEL - Abnormal; Notable for the following:    Glucose, Bld 109 (*)    Calcium 8.6 (*)    Total Bilirubin 1.9 (*)    All other components within normal limits  DIFFERENTIAL - Abnormal; Notable for the following:    Neutro Abs 1.6 (*)    Lymphs Abs 0.5 (*)    All other components within normal limits  URINE RAPID DRUG SCREEN, HOSP PERFORMED  SAVE SMEAR  PATHOLOGIST SMEAR REVIEW  CBC WITH DIFFERENTIAL/PLATELET  OCCULT BLOOD X 1 CARD TO LAB, STOOL  I-STAT TROPOININ, ED  TYPE AND SCREEN  PREPARE RBC (CROSSMATCH)    Imaging Review Dg Chest 2 View  06/26/2015  CLINICAL DATA:  Pancreatitis EXAM: CHEST  2 VIEW COMPARISON:  10/30/2014 FINDINGS: Upper normal heart size. Normal vascularity. Dextroscoliosis of the lower thoracic spine. No pneumothorax or pleural effusion. EKG stickers project over the upper lung zones. No consolidation or mass. IMPRESSION: No active cardiopulmonary disease. Electronically Signed   By: Marybelle Killings M.D.   On: 06/26/2015 10:33   I have personally reviewed and evaluated these images and lab results as part of my medical decision-making.   EKG Interpretation None      MDM   Final diagnoses:  Pancytopenia (HCC)  Symptomatic anemia    44yM with pancytopenia. Underlying etiology not  clear at this time. Will repeat labs, but based on symptoms I do not expect to be significantly improved from 3/22. Initial labs ordered per protocol. Also needs differential, smear, type/screen and with hx of etoh abuse will check INR. Significant anemia would explain a lot of his symptoms. Underlying etiology not clear at this time. No overt bleeding. Reports has been clean from ETOH/drugs for over a year. Will transfuse PRN.      Virgel Manifold, MD 06/26/15 [phone]

## 2015-06-26 NOTE — ED Notes (Signed)
HgB: 6.2  Alerted primary nurse

## 2015-06-26 NOTE — H&P (Addendum)
PATIENT DETAILS Name: Lawrence Brock Age: 45 y.o. Sex: male Date of Birth: Feb 02, 1971 Admit Date: 06/26/2015 [id number], Dola Factor, MD Referring Physician:Dr Kohut   CHIEF COMPLAINT:  Exertional dyspnea for almost 1 month  HPI: Lawrence Brock is a 45 y.o. male with a Past Medical History of alcoholic cirrhosis, prior history of liver abscesses/cholangitis requiring CBD stent, known history of esophageal and gastric varices who presents today with exertional dyspnea for approximately one month. Per patient, he has been noticing that even walking 10-15 feet he is started getting severely dyspneic. He denies any orthopnea or PND. He apparently had CBC drawn at his PCPs office a few days back, where he was found to have a hemoglobin of 6.2, he was subsequently referred to the emergency room for further evaluation and treatment. Was subsequently asked to admit this patient.  Patient denies any melena, hematochezia or hematemesis. He currently denies any abdominal pain. He does acknowledge some mild lower extremity swelling   ALLERGIES:  No Known Allergies  PAST MEDICAL HISTORY: Past Medical History  Diagnosis Date  . Pancreatitis   . Chronic back pain   . Chronic pancreatitis ([place]) 09/10/2013  . Hypertension     No meds yet.  . Pneumonia     10-30-14 [place] admission, no problems now.  . Platelets decreased ([place])     to continue to be evaluated and followed by PCP  . Anemia   . Abdominal pain     intermittent abdominal pain in lower abdomen    PAST SURGICAL HISTORY: Past Surgical History  Procedure Laterality Date  . Appendectomy    . Esophagogastroduodenoscopy (egd) with propofol N/A 09/14/2013    Procedure: ESOPHAGOGASTRODUODENOSCOPY (EGD) WITH PROPOFOL;  Surgeon: Jerene Bears, MD;  Location: [facility];  Service: Gastroenterology;  Laterality: N/A;  . Gastrointestinal stent removal N/A 09/14/2013    Procedure: GASTROINTESTINAL STENT REMOVAL;  Surgeon:  Jerene Bears, MD;  Location: [facility];  Service: Gastroenterology;  Laterality: N/A;  pancreatic stent removal  . Ercp N/A 02/22/2014    Procedure: ENDOSCOPIC RETROGRADE CHOLANGIOPANCREATOGRAPHY (ERCP);  Surgeon: Ladene Artist, MD;  Location: [facility];  Service: Endoscopy;  Laterality: N/A;  . Ercp N/A 02/18/2014    Procedure: ENDOSCOPIC RETROGRADE CHOLANGIOPANCREATOGRAPHY (ERCP);  Surgeon: Inda Castle, MD;  Location: [facility];  Service: Gastroenterology;  Laterality: N/A;  . Endoscopic retrograde cholangiopancreatography (ercp) with propofol N/A 06/02/2014    Procedure: ENDOSCOPIC RETROGRADE CHOLANGIOPANCREATOGRAPHY (ERCP) WITH PROPOFOL;  Surgeon: Milus Banister, MD;  [facility];  Service: Endoscopy;  Laterality: N/A;  . Colonoscopy with propofol N/A 01/05/2015    Procedure: COLONOSCOPY WITH PROPOFOL;  Surgeon: Milus Banister, MD;  [facility];  Service: Endoscopy;  Laterality: N/A;  . Esophagogastroduodenoscopy (egd) with propofol N/A 01/05/2015    Procedure: ESOPHAGOGASTRODUODENOSCOPY (EGD) WITH PROPOFOL;  Surgeon: Milus Banister, MD;  [facility];  Service: Endoscopy;  Laterality: N/A;    MEDICATIONS AT HOME: Prior to Admission medications   Medication Sig Start Date End Date Taking? Authorizing Provider  oxycodone (OXY-IR) 5 MG capsule Take 1 capsule (5 mg total) by mouth every 4 (four) hours as needed. Patient taking differently: Take 5-10 mg by mouth every 4 (four) hours as needed for pain.  [id number]  Yes Delora Fuel, MD  Pancrelipase, Lip-Prot-Amyl, (ZENPEP) 40000 UNITS CPEP Take 1 capsule by mouth 3 (three) times daily.    Yes Historical Provider, MD  pantoprazole (PROTONIX) 20 MG tablet Take  20 mg by mouth daily.   Yes Historical Provider, MD  nadolol (CORGARD) 20 MG tablet Take 2 tablets once a day. Patient not taking: Reported on 06/26/2015 01/30/15   Milus Banister, MD    FAMILY HISTORY: Family History  Problem Relation Age of Onset    . Hypertension Mother     SOCIAL HISTORY:  reports that he has been smoking Cigarettes.  He has been smoking about 0.50 packs per day. He has never used smokeless tobacco. He reports that he does not drink alcohol or use illicit drugs. Lives at: Home Mobility: Independent  REVIEW OF SYSTEMS:  Constitutional:   No  weight loss, night sweats,  Fevers, chills, fatigue.  HEENT:    No headaches, Dysphagia,Tooth/dental problems,Sore throat,   Cardio-vascular: No chest pain,Orthopnea, PND, anasarca, palpitations  GI:  No heartburn, indigestion, abdominal pain, nausea, vomiting, diarrhea, melena or hematochezia  Resp: No  cough, hemoptysis,plueritic chest pain.   Skin:  No rash or lesions.  GU:  No dysuria, change in color of urine, no urgency or frequency.  No flank pain.  Musculoskeletal: No joint pain or swelling.  No decreased range of motion.  No back pain.  Endocrine: No heat intolerance, no cold intolerance, no polyuria, no polydipsia  Psych: No change in mood or affect. No depression or anxiety.  No memory loss.   PHYSICAL EXAM: Blood pressure 120/78, pulse 84, temperature 97.3 F (36.3 C), temperature source Oral, resp. rate 19, SpO2 100 %.  General appearance :Awake, alert, not in any distress. Looks pale. Speech Clear. Not toxic Looking HEENT: Atraumatic and Normocephalic, pupils equally reactive to light and accomodation Neck: supple, no JVD. No cervical lymphadenopathy.  Chest:Good air entry bilaterally, no added sounds  CVS: S1 S2 regular, no murmurs.  Abdomen: Bowel sounds present, Non tender and not distended with no gaurding, rigidity or rebound. Extremities: B/L Lower Ext shows + edema, both legs are warm to touch Neurology:  Non focal Skin:No Rash Wounds:N/A  LABS ON ADMISSION:   Recent Labs  06/26/15 1028  NA 139  K 3.7  CL 110  CO2 23  GLUCOSE 109*  BUN 20  CREATININE 0.90  CALCIUM 8.6*    Recent Labs  06/26/15 1028  AST 24  ALT  26  ALKPHOS 61  BILITOT 1.9*  PROT 7.2  ALBUMIN 3.8   No results for input(s): LIPASE, AMYLASE in the last 72 hours.  Recent Labs  06/26/15 1028  WBC 2.5*  NEUTROABS 1.6*  HGB 6.2*  HCT 23.0*  MCV 62.0*  PLT 41*   No results for input(s): CKTOTAL, CKMB, CKMBINDEX, TROPONINI in the last 72 hours. No results for input(s): DDIMER in the last 72 hours. Invalid input(s): POCBNP   RADIOLOGIC STUDIES ON ADMISSION: Dg Chest 2 View  06/26/2015  CLINICAL DATA:  Pancreatitis EXAM: CHEST  2 VIEW COMPARISON:  10/30/2014 FINDINGS: Upper normal heart size. Normal vascularity. Dextroscoliosis of the lower thoracic spine. No pneumothorax or pleural effusion. EKG stickers project over the upper lung zones. No consolidation or mass. IMPRESSION: No active cardiopulmonary disease. Electronically Signed   By: Marybelle Killings M.D.   On: 06/26/2015 10:33    I have personally reviewed images of chest xray    EKG: Personally reviewed. Normal sinus rhythm  ASSESSMENT AND PLAN: Present on Admission:  . Anemia: Suspect he has some amount of chronic anemia at baseline, however hemoglobin is down to 6.2 and patient is very symptomatic. Given history of gastric/esophageal varices-some suspicion for chronic/slow  blood loss that was not noticed by the patient. Currently no overt bleeding, on rectal exam-vault is empty without any stools. Will transfuse 2 units of PRBC, check anemia panel, will consult gastroenterology to see if he requires endoscopic studies for further evaluation.  . 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   . Chronic pancreatitis : Currently stable without abdominal pain. Continue pancreatic supplements   . Common bile duct stricture: Apparently has been seen at [facility] where he is requiring a CBD stent. LFTs appears stable. He is nontender, afebrile and without leukocytosis.   . Alcoholic liver cirrhosis: He does appear to have mild  lower extremity edema, well started on Aldactone, I think he is noncompliant to beta blocker-resume nadolol. GI has been consulted. Note-per patient he has been not been drinking for the past 19 months.  . Prior history of cocaine use: Urine drug screen negative, per patient he has been clean for past 2 years.  Further plan will depend as patient's clinical course evolves and further radiologic and laboratory data become available. Patient will be monitored closely.  Above noted plan was discussed with patient face to face at bedside, he was in agreement.   CONSULTS: GI  DVT Prophylaxis: SCD's  Code Status: Full Code  Disposition Plan:  Discharge back home possibly in 1-2 days,but may warrant SNF depending on clinical course   Total time spent  55 minutes.Greater than 50% of this time was spent in counseling, explanation of diagnosis, planning of further management, and coordination of care.  [facility] Pager [phone]  If 7PM-7AM, please contact night-coverage [email or web address] Password [email or web address] 06/26/2015, 1:06 PM

## 2015-06-26 NOTE — Consult Note (Addendum)
[facility] Gastroenterology Consult Note   History Lawrence Brock MRN # [id number]  Date of Admission: 06/26/2015 Date of Consultation: 06/26/2015 Referring physician: Dr. Jonetta Osgood, MD  Reason for Consultation/Chief Complaint: Iron deficiency anemia  Subjective HPI:  We were asked by the hospitalist service to evaluate this 45 year old man well known to my partner Dr. [name]. This patient has alcohol-related liver cirrhosis, sober for the last 18-24 months. He has had a chronic biliary stricture requiring stent placement. He has also had chronic iron deficiency anemia, last hemoglobin was over 11 with a ferritin of 12 in October 2016. He had complained of some rectal bleeding, thus Dr. [name] did upper endoscopy and colonoscopy. Nonbleeding esophageal varices were found, prophylactic nadolol was started. Colonoscopy revealed a few small polyps removed with snare polypectomy, moderate size internal hemorrhoids were also found. The hemorrhoids were suspected to be the source of the bleeding. It does not appear the patient has followed up in the clinic since then, he came to the ED today complaining of about a month of worsening exertional dyspnea. He was found to have a hemoglobin of 6.2 with a low MCV. He denies abdominal pain black tarry stool hematochezia nausea vomiting early satiety hematemesis anorexia or weight loss. He has not been taking aspirin or NSAIDs, and is not on iron tablets. He says that many years ago in [place] he try to take iron tablets but they cause chronic abdominal pain requiring him to go to the emergency room.  ROS:  Cardiovascular:    Recent right ankle edema  All other systems are negative except as noted above in the HPI  Past Medical History Past Medical History  Diagnosis Date  . Pancreatitis   . Chronic back pain   . Chronic pancreatitis ([place]) 09/10/2013  . Hypertension     No meds yet.  . Pneumonia     10-30-14 [place] admission, no problems now.   . Platelets decreased ([place])     to continue to be evaluated and followed by PCP  . Anemia   . Abdominal pain     intermittent abdominal pain in lower abdomen    Past Surgical History Past Surgical History  Procedure Laterality Date  . Appendectomy    . Esophagogastroduodenoscopy (egd) with propofol N/A 09/14/2013    Procedure: ESOPHAGOGASTRODUODENOSCOPY (EGD) WITH PROPOFOL;  Surgeon: Jerene Bears, MD;  Location: [facility];  Service: Gastroenterology;  Laterality: N/A;  . Gastrointestinal stent removal N/A 09/14/2013    Procedure: GASTROINTESTINAL STENT REMOVAL;  Surgeon: Jerene Bears, MD;  [facility];  Service: Gastroenterology;  Laterality: N/A;  pancreatic stent removal  . Ercp N/A 02/22/2014    Procedure: ENDOSCOPIC RETROGRADE CHOLANGIOPANCREATOGRAPHY (ERCP);  Surgeon: Ladene Artist, MD;  Location: [facility];  Service: Endoscopy;  Laterality: N/A;  . Ercp N/A 02/18/2014    Procedure: ENDOSCOPIC RETROGRADE CHOLANGIOPANCREATOGRAPHY (ERCP);  Surgeon: Inda Castle, MD;  Location: [facility];  Service: Gastroenterology;  Laterality: N/A;  . Endoscopic retrograde cholangiopancreatography (ercp) with propofol N/A 06/02/2014    Procedure: ENDOSCOPIC RETROGRADE CHOLANGIOPANCREATOGRAPHY (ERCP) WITH PROPOFOL;  Surgeon: Milus Banister, MD;  [facility]: [facility];  Service: Endoscopy;  Laterality: N/A;  . Colonoscopy with propofol N/A 01/05/2015    Procedure: COLONOSCOPY WITH PROPOFOL;  Surgeon: Milus Banister, MD;  [facility];  Service: Endoscopy;  Laterality: N/A;  . Esophagogastroduodenoscopy (egd) with propofol N/A 01/05/2015    Procedure: ESOPHAGOGASTRODUODENOSCOPY (EGD) WITH PROPOFOL;  Surgeon: Milus Banister, MD;  [facility];  Service: Endoscopy;  Laterality: N/A;    Family History Family History  Problem Relation Age of Onset  . Hypertension Mother     Social History Social History   Social History  . Marital Status: Married    Spouse  Name: N/A  . Number of Children: 4  . Years of Education: N/A   Occupational History  . Chemical Operator    Social History Main Topics  . Smoking status: Current Every Day Smoker -- 0.50 packs/day    Types: Cigarettes  . Smokeless tobacco: Never Used  . Alcohol Use: No     Comment: former ETOH abuse, quit a month ago per patient  . Drug Use: No  . Sexual Activity: Yes   Other Topics Concern  . None   Social History Narrative    Allergies No Known Allergies  Outpatient Meds Home medications from the H+P and/or nursing med reconciliation reviewed.  Inpatient med list reviewed  _____________________________________________________________________ Objective  Exam:  Current vital signs  Patient Vitals for the past 8 hrs:  BP Temp Temp src Pulse Resp SpO2  06/26/15 1514 109/66 mmHg 98.2 F (36.8 C) Oral 72 - 100 %  06/26/15 1434 103/64 mmHg - - 69 - -  06/26/15 1337 107/73 mmHg 98.1 F (36.7 C) Oral 67 - 100 %  06/26/15 1202 - - - 84 19 100 %  06/26/15 1200 120/78 mmHg - - 67 (!) 29 100 %  06/26/15 1115 - - - 65 15 96 %  06/26/15 1100 117/80 mmHg - - (!) 25 20 (!) 80 %  06/26/15 1028 127/97 mmHg - - 71 20 100 %  06/26/15 1010 114/86 mmHg 97.3 F (36.3 C) Oral 74 18 100 %  06/26/15 1004 - 97.3 F (36.3 C) Oral - - -   No intake or output data in the 24 hours ending 06/26/15 1521  Physical Exam:  General: this is a pleasant middle-aged Filipino male patient in no acute distress. Good muscle mass  Eyes: sclera anicteric, no redness  ENT: oral mucosa moist without lesions, no cervical or supraclavicular lymphadenopathy, good dentition  CV: RRR without murmur, S1/S2, no JVD,, mild right ankle peripheral edema  Resp: clear to auscultation bilaterally, normal RR and effort noted  GI: soft, no tenderness, with active bowel sounds. No guarding or palpable organomegaly noted  Skin; warm and dry, no rash or jaundice noted  Neuro: awake, alert and oriented x 3.  Normal gross motor function and fluent speech.  Rectal exam was deferred, as the admitting team reported that there was no stool or blood in the rectal vault.  Labs:   Recent Labs Lab 06/26/15 1028  WBC 2.5*  HGB 6.2*  HCT 23.0*  PLT 41*   MCV 62  Recent Labs Lab 06/26/15 1028  NA 139  K 3.7  CL 110  CO2 23  BUN 20  ALBUMIN 3.8  ALKPHOS 61  ALT 26  AST 24  GLUCOSE 109*    Recent Labs Lab 06/26/15 1028  INR 1.23    Radiologic studies:   @ASSESSMENTPLANBEGIN @ Impression:  Iron deficiency anemia with no current evidence of overt GI bleeding. This is probably a combination of his cirrhosis with chronic occult gastric or small bowel  blood loss exacerbated by low platelets  Alcoholic cirrhosis - stable Chronic pancreatitis from prior EtOH abuse - stable  Plan:  Transfuse 2 units of PRBCs overnight Tomorrow give him 200 mg of IV iron sucrose 1 If there is no overt GI  bleeding overnight, I feel he can be discharged home for outpatient management with Dr. [name]. He'll most likely need periodic IV iron therapy.    Thank you for the courtesy of this consult.  Please contact me with any questions or concerns.  Nelida Meuse III Pager: [phone] Mon-Fri 8a-5p [phone] after 5p, weekends, holidays

## 2015-06-26 NOTE — ED Notes (Signed)
Pt given urinal and made aware of need for urine specimen 

## 2015-06-26 NOTE — ED Notes (Signed)
Two weeks ago had blood work done showing a HgB of 6. C/o SOB and right calf pain with a dry cough also. Right leg from hip down is moderately swollen, no redness or excessive warmth observed.

## 2015-06-27 ENCOUNTER — Other Ambulatory Visit: Payer: Self-pay

## 2015-06-27 DIAGNOSIS — K831 Obstruction of bile duct: Secondary | ICD-10-CM

## 2015-06-27 DIAGNOSIS — D61818 Other pancytopenia: Principal | ICD-10-CM

## 2015-06-27 DIAGNOSIS — D649 Anemia, unspecified: Secondary | ICD-10-CM

## 2015-06-27 DIAGNOSIS — D509 Iron deficiency anemia, unspecified: Secondary | ICD-10-CM

## 2015-06-27 LAB — CBC
HEMATOCRIT: 25.6 % — AB (ref 39.0–52.0)
HEMATOCRIT: 29.7 % — AB (ref 39.0–52.0)
HEMOGLOBIN: 8.7 g/dL — AB (ref 13.0–17.0)
Hemoglobin: 7.2 g/dL — ABNORMAL LOW (ref 13.0–17.0)
MCH: 18.2 pg — AB (ref 26.0–34.0)
MCH: 19.3 pg — ABNORMAL LOW (ref 26.0–34.0)
MCHC: 28.1 g/dL — ABNORMAL LOW (ref 30.0–36.0)
MCHC: 29.3 g/dL — AB (ref 30.0–36.0)
MCV: 64.8 fL — AB (ref 78.0–100.0)
MCV: 65.9 fL — ABNORMAL LOW (ref 78.0–100.0)
PLATELETS: 35 10*3/uL — AB (ref 150–400)
Platelets: 30 10*3/uL — ABNORMAL LOW (ref 150–400)
RBC: 3.95 MIL/uL — AB (ref 4.22–5.81)
RBC: 4.51 MIL/uL (ref 4.22–5.81)
RDW: 20 % — AB (ref 11.5–15.5)
RDW: 20.8 % — ABNORMAL HIGH (ref 11.5–15.5)
WBC: 2.2 10*3/uL — ABNORMAL LOW (ref 4.0–10.5)
WBC: 2.5 10*3/uL — ABNORMAL LOW (ref 4.0–10.5)

## 2015-06-27 LAB — COMPREHENSIVE METABOLIC PANEL
ALT: 22 U/L (ref 17–63)
AST: 20 U/L (ref 15–41)
Albumin: 3.2 g/dL — ABNORMAL LOW (ref 3.5–5.0)
Alkaline Phosphatase: 52 U/L (ref 38–126)
Anion gap: 6 (ref 5–15)
BILIRUBIN TOTAL: 5.4 mg/dL — AB (ref 0.3–1.2)
BUN: 20 mg/dL (ref 6–20)
CHLORIDE: 112 mmol/L — AB (ref 101–111)
CO2: 23 mmol/L (ref 22–32)
CREATININE: 0.96 mg/dL (ref 0.61–1.24)
Calcium: 8.5 mg/dL — ABNORMAL LOW (ref 8.9–10.3)
Glucose, Bld: 114 mg/dL — ABNORMAL HIGH (ref 65–99)
Potassium: 4 mmol/L (ref 3.5–5.1)
Sodium: 141 mmol/L (ref 135–145)
TOTAL PROTEIN: 6.2 g/dL — AB (ref 6.5–8.1)

## 2015-06-27 LAB — PREPARE RBC (CROSSMATCH)

## 2015-06-27 MED ORDER — SODIUM CHLORIDE 0.9 % IV SOLN
250.0000 mg | Freq: Once | INTRAVENOUS | Status: AC
  Administered 2015-06-27: 250 mg via INTRAVENOUS
  Filled 2015-06-27: qty 20

## 2015-06-27 NOTE — Discharge Instructions (Signed)
Come to lab at Dr [name] office week of 4/17 /2017  For labs  Dr [name] office will call you with appt time

## 2015-06-27 NOTE — Progress Notes (Signed)
Patient ID: Lawrence Brock, male   DOB: 06-Oct-1970, 45 y.o.   MRN: [id number]    Progress Note   Subjective  Pt received 2 units Prbc's last pm, IV Fe pending  Feels fine today, in good spirits, no bleeding   Objective   Vital signs in last 24 hours: Temp:  [97.3 F (36.3 C)-98.8 F (37.1 C)] 98.4 F (36.9 C) (04/04 0552) Pulse Rate:  [25-84] 64 (04/04 0552) Resp:  [15-29] 16 (04/04 0552) BP: (103-129)/(61-97) 107/78 mmHg (04/04 0552) SpO2:  [80 %-100 %] 98 % (04/04 0552) Last BM Date: 06/26/15 General:   Asian male in NAD Heart:  Regular rate and rhythm; no murmurs Lungs: Respirations even and unlabored, lungs CTA bilaterally Abdomen:  Soft, nontender and nondistended. Normal bowel sounds. Extremities:  Without edema. Neurologic:  Alert and oriented,  grossly normal neurologically. Psych:  Cooperative. Normal mood and affect.  Intake/Output from previous day: 04/03 0701 - 04/04 0700 In: 948 [P.O.:360; Blood:588] Out: 350 [Urine:350] Intake/Output this shift:    Lab Results:  Recent Labs  06/26/15 1028 06/27/15 0327  WBC 2.5* 2.2*  HGB 6.2* 7.2*  HCT 23.0* 25.6*  PLT 41* 35*   BMET  Recent Labs  06/26/15 1028 06/27/15 0327  NA 139 141  K 3.7 4.0  CL 110 112*  CO2 23 23  GLUCOSE 109* 114*  BUN 20 20  CREATININE 0.90 0.96  CALCIUM 8.6* 8.5*   LFT  Recent Labs  06/27/15 0327  PROT 6.2*  ALBUMIN 3.2*  AST 20  ALT 22  ALKPHOS 52  BILITOT 5.4*   PT/INR  Recent Labs  06/26/15 1028  LABPROT 15.6*  INR 1.23    Studies/Results: Dg Chest 2 View  06/26/2015  CLINICAL DATA:  Pancreatitis EXAM: CHEST  2 VIEW COMPARISON:  10/30/2014 FINDINGS: Upper normal heart size. Normal vascularity. Dextroscoliosis of the lower thoracic spine. No pneumothorax or pleural effusion. EKG stickers project over the upper lung zones. No consolidation or mass. IMPRESSION: No active cardiopulmonary disease. Electronically Signed   By: Marybelle Killings M.D.   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 Has been transfused x 2, will receive one more today To receive IV Fe today prior to discharge  Continue nadolol 20 mg daily Continue protonix 40 mg po daily Will arrange for office follow up for labs  In 2 weeks  come to [place] lab week of 4/17  , and may need another Fe infusion outpt  Periodically (Dr [name])  Office will call him with office appt with Dr [name]  #2 chronic pancreatitis  Continue current dose of Zenpep ETOH abstinence  Principal Problem:   Anemia Active Problems:   Chronic pancreatitis ([place])   Common bile duct stricture   Pancytopenia ([place])     LOS: 1 day   Amy Esterwood  06/27/2015, 9:10 AM

## 2015-06-27 NOTE — Discharge Summary (Signed)
Physician Discharge Summary  Lawrence Brock [id number] DOB: 02-18-1971 DOA: 06/26/2015  PCP: [name], MD  Admit date: 06/26/2015 Discharge date: 06/27/2015  Time spent: 40 minutes  Recommendations for Outpatient Follow-up:  1. Follow-up with gastroenterology as outpatient   Discharge Diagnoses:  Principal Problem:   Anemia Active Problems:   Chronic pancreatitis ([place])   Common bile duct stricture   Pancytopenia ([place])   Discharge Condition: Stable  Diet recommendation: Heart healthy  There were no vitals filed for this visit.  History of present illness:  Lawrence Brock is a 45 y.o. male with a Past Medical History of alcoholic cirrhosis, prior history of liver abscesses/cholangitis requiring CBD stent, known history of esophageal and gastric varices who presents today with exertional dyspnea for approximately one month. Per patient, he has been noticing that even walking 10-15 feet he is started getting severely dyspneic. He denies any orthopnea or PND. He apparently had CBC drawn at his PCPs office a few days back, where he was found to have a hemoglobin of 6.2, he was subsequently referred to the emergency room for further evaluation and treatment. Was subsequently asked to admit this patient.  Patient denies any melena, hematochezia or hematemesis. He currently denies any abdominal pain. He does acknowledge some mild lower extremity swelling  Hospital Course:   . Anemia:  Symptomatic anemia, acute on chronic anemia. He has iron deficiency anemia leading to the iron studies. Suspect patient has chronic anemia secondary to cirrhosis, presented with acute anemia with hemoglobin of 6.2. Presented with anemia symptoms including generalized weakness and shortness of breath. Denies any hematemesis or black stools, given 3 units of packed RBCs. Status post infusion of 250 mg Nulecit iron (iron 9, ferritin 2 and TIBC is 459). Follow-up as outpatient.   GI  bleed Gastroenterology consulted, patient has history of cirrhosis. There is no evidence of overt bleeding, seen by GI and recommended transfusion and follow-up as outpatient. Continue PPI and propranolol as outpatient.  . 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   . Chronic pancreatitis : Currently stable without abdominal pain. Continue pancreatic supplements   . Common bile duct stricture: Apparently has been seen at [facility] where he is requiring a CBD stent. LFTs appears stable. He is nontender, afebrile and without leukocytosis.   . Alcoholic liver cirrhosis: He does appear to have mild lower extremity edema, well started on Aldactone, I think he is noncompliant to beta blocker-resume nadolol. GI has been consulted. Note-per patient he has been not been drinking for the past 19 months.  . Prior history of cocaine use: Urine drug screen negative, per patient he has been clean for past 2 years.   Procedures:  None  Consultations:  GI  Discharge Exam: Filed Vitals:   06/27/15 0552 06/27/15 1050  BP: 107/78 111/73  Pulse: 64 70  Temp: 98.4 F (36.9 C) 98.7 F (37.1 C)  Resp: 16 16   General: Alert and awake, oriented x3, not in any acute distress. HEENT: anicteric sclera, pupils reactive to light and accommodation, EOMI CVS: S1-S2 clear, no murmur rubs or gallops Chest: clear to auscultation bilaterally, no wheezing, rales or rhonchi Abdomen: soft nontender, nondistended, normal bowel sounds, no organomegaly Extremities: no cyanosis, clubbing or edema noted bilaterally Neuro: Cranial nerves II-XII intact, no focal neurological deficits  Discharge Instructions   Discharge Instructions    Diet - low sodium heart healthy    Complete by:  As directed      Increase activity  slowly    Complete by:  As directed           Current Discharge Medication List    CONTINUE these medications which have NOT CHANGED    Details  oxycodone (OXY-IR) 5 MG capsule Take 1 capsule (5 mg total) by mouth every 4 (four) hours as needed. Qty: 15 capsule, Refills: 0    Pancrelipase, Lip-Prot-Amyl, (ZENPEP) 40000 UNITS CPEP Take 1 capsule by mouth 3 (three) times daily.     pantoprazole (PROTONIX) 20 MG tablet Take 20 mg by mouth daily.    nadolol (CORGARD) 20 MG tablet Take 2 tablets once a day. Qty: 180 tablet, Refills: 2       No Known Allergies    The results of significant diagnostics from this hospitalization (including imaging, microbiology, ancillary and laboratory) are listed below for reference.    Significant Diagnostic Studies: Dg Chest 2 View  06/26/2015  CLINICAL DATA:  Pancreatitis EXAM: CHEST  2 VIEW COMPARISON:  10/30/2014 FINDINGS: Upper normal heart size. Normal vascularity. Dextroscoliosis of the lower thoracic spine. No pneumothorax or pleural effusion. EKG stickers project over the upper lung zones. No consolidation or mass. IMPRESSION: No active cardiopulmonary disease. Electronically Signed   By: Marybelle Killings M.D.   On: 06/26/2015 10:33    Microbiology: No results found for this or any previous visit (from the past 240 hour(s)).   Labs: Basic Metabolic Panel:  Recent Labs Lab 06/26/15 1028 06/27/15 0327  NA 139 141  K 3.7 4.0  CL 110 112*  CO2 23 23  GLUCOSE 109* 114*  BUN 20 20  CREATININE 0.90 0.96  CALCIUM 8.6* 8.5*   Liver Function Tests:  Recent Labs Lab 06/26/15 1028 06/27/15 0327  AST 24 20  ALT 26 22  ALKPHOS 61 52  BILITOT 1.9* 5.4*  PROT 7.2 6.2*  ALBUMIN 3.8 3.2*   No results for input(s): LIPASE, AMYLASE in the last 168 hours. No results for input(s): AMMONIA in the last 168 hours. CBC:  Recent Labs Lab 06/26/15 1028 06/27/15 0327  WBC 2.5* 2.2*  NEUTROABS 1.6*  --   HGB 6.2* 7.2*  HCT 23.0* 25.6*  MCV 62.0* 64.8*  PLT 41* 35*   Cardiac Enzymes: No results for input(s): CKTOTAL, CKMB, CKMBINDEX, TROPONINI in the last 168  hours. BNP: BNP (last 3 results)  Recent Labs  10/30/14 2050  BNP 11.5    ProBNP (last 3 results) No results for input(s): PROBNP in the last 8760 hours.  CBG: No results for input(s): GLUCAP in the last 168 hours.     Signed:  Birdie Hopes MD.  [facility] 06/27/2015, 11:15 AM

## 2015-06-28 LAB — TYPE AND SCREEN
ABO/RH(D): A POS
Antibody Screen: NEGATIVE
Unit division: 0
Unit division: 0
Unit division: 0

## 2015-07-01 DIAGNOSIS — D509 Iron deficiency anemia, unspecified: Secondary | ICD-10-CM | POA: Insufficient documentation

## 2015-07-01 DIAGNOSIS — K7030 Alcoholic cirrhosis of liver without ascites: Secondary | ICD-10-CM | POA: Insufficient documentation

## 2015-08-13 ENCOUNTER — Emergency Department (HOSPITAL_BASED_OUTPATIENT_CLINIC_OR_DEPARTMENT_OTHER): Payer: BLUE CROSS/BLUE SHIELD

## 2015-08-13 ENCOUNTER — Emergency Department (HOSPITAL_BASED_OUTPATIENT_CLINIC_OR_DEPARTMENT_OTHER)
Admission: EM | Admit: 2015-08-13 | Discharge: 2015-08-14 | Disposition: A | Discharge status: Home / Self Care | Payer: BLUE CROSS/BLUE SHIELD | Attending: Emergency Medicine | Admitting: Emergency Medicine

## 2015-08-13 ENCOUNTER — Encounter (HOSPITAL_BASED_OUTPATIENT_CLINIC_OR_DEPARTMENT_OTHER): Payer: Self-pay

## 2015-08-13 DIAGNOSIS — F17210 Nicotine dependence, cigarettes, uncomplicated: Secondary | ICD-10-CM | POA: Diagnosis not present

## 2015-08-13 DIAGNOSIS — R0602 Shortness of breath: Secondary | ICD-10-CM | POA: Insufficient documentation

## 2015-08-13 DIAGNOSIS — J020 Streptococcal pharyngitis: Secondary | ICD-10-CM | POA: Insufficient documentation

## 2015-08-13 DIAGNOSIS — I10 Essential (primary) hypertension: Secondary | ICD-10-CM | POA: Insufficient documentation

## 2015-08-13 DIAGNOSIS — R52 Pain, unspecified: Secondary | ICD-10-CM | POA: Diagnosis present

## 2015-08-13 DIAGNOSIS — R1084 Generalized abdominal pain: Secondary | ICD-10-CM | POA: Insufficient documentation

## 2015-08-13 NOTE — ED Provider Notes (Signed)
CSN: [id number]     Arrival date & time 08/13/15  2131 History  By signing my name below, I, Stephania Fragmin, attest that this documentation has been prepared under the direction and in the presence of Shanon Rosser, MD. Electronically Signed: Stephania Fragmin, ED Scribe. 08/13/2015. 12:02 AM.    Chief Complaint  Patient presents with  . Generalized Body Aches   The history is provided by the patient. No language interpreter was used.   HPI Comments: Lawrence Brock is a 45 y.o. male with a history of pneumonia and chronic pancytopenia, who presents to the Emergency Department complaining of an intermittent fever that began 5 days ago. Associated symptoms include generalized myalgias, decreased appetite, cough, mild SOB, mild sore throat and mild generalized abdominal pain (not like previous pancreatitis). He states he came in to the ED today because his symptoms had become worse. He states he had not eaten anything today, due to decreased appetite. Patient had last taken Excedrin at 7 PM. He states he has been regularly drinking ginger ale as fluid intake. He denies nausea, vomiting or diarrhea.  Past Medical History  Diagnosis Date  . Pancreatitis   . Chronic back pain   . Chronic pancreatitis ([place]) 09/10/2013  . Hypertension     No meds yet.  . Pneumonia     10-30-14 [place] admission, no problems now.  . Platelets decreased ([place])     to continue to be evaluated and followed by PCP  . Anemia   . Abdominal pain     intermittent abdominal pain in lower abdomen   Past Surgical History  Procedure Laterality Date  . Appendectomy    . Esophagogastroduodenoscopy (egd) with propofol N/A 09/14/2013    Procedure: ESOPHAGOGASTRODUODENOSCOPY (EGD) WITH PROPOFOL;  Surgeon: Jerene Bears, MD;  Location: [facility];  Service: Gastroenterology;  Laterality: N/A;  . Gastrointestinal stent removal N/A 09/14/2013    Procedure: GASTROINTESTINAL STENT REMOVAL;  Surgeon: Jerene Bears, MD;  Location: [facility];   Service: Gastroenterology;  Laterality: N/A;  pancreatic stent removal  . Ercp N/A 02/22/2014    Procedure: ENDOSCOPIC RETROGRADE CHOLANGIOPANCREATOGRAPHY (ERCP);  Surgeon: Ladene Artist, MD;  Location: [facility];  Service: Endoscopy;  Laterality: N/A;  . Ercp N/A 02/18/2014    Procedure: ENDOSCOPIC RETROGRADE CHOLANGIOPANCREATOGRAPHY (ERCP);  Surgeon: Inda Castle, MD;  Location: [facility];  Service: Gastroenterology;  Laterality: N/A;  . Endoscopic retrograde cholangiopancreatography (ercp) with propofol N/A 06/02/2014    Procedure: ENDOSCOPIC RETROGRADE CHOLANGIOPANCREATOGRAPHY (ERCP) WITH PROPOFOL;  Surgeon: Milus Banister, MD;  Location: [facility];  Service: Endoscopy;  Laterality: N/A;  . Colonoscopy with propofol N/A 01/05/2015    Procedure: COLONOSCOPY WITH PROPOFOL;  Surgeon: Milus Banister, MD;  Location: [facility];  Service: Endoscopy;  Laterality: N/A;  . Esophagogastroduodenoscopy (egd) with propofol N/A 01/05/2015    Procedure: ESOPHAGOGASTRODUODENOSCOPY (EGD) WITH PROPOFOL;  Surgeon: Milus Banister, MD;  [facility];  Service: Endoscopy;  Laterality: N/A;   Family History  Problem Relation Age of Onset  . Hypertension Mother    Social History  Substance Use Topics  . Smoking status: Current Every Day Smoker -- 0.50 packs/day    Types: Cigarettes  . Smokeless tobacco: Never Used  . Alcohol Use: No     Comment: former ETOH abuse, quit a month ago per patient    Review of Systems  All other systems reviewed and are negative.   Allergies  Review of patient's allergies indicates no known allergies.  Home  Medications   Prior to Admission medications   Medication Sig Start Date End Date Taking? Authorizing Provider  nadolol (CORGARD) 20 MG tablet Take 2 tablets once a day. Patient not taking: Reported on 06/26/2015 01/30/15   Milus Banister, MD  oxycodone (OXY-IR) 5 MG capsule Take 1 capsule (5 mg total) by mouth every 4 (four) hours as  needed. Patient taking differently: Take 5-10 mg by mouth every 4 (four) hours as needed for pain.  [id number]   Delora Fuel, MD  Pancrelipase, Lip-Prot-Amyl, (ZENPEP) 40000 UNITS CPEP Take 1 capsule by mouth 3 (three) times daily.     Historical Provider, MD  pantoprazole (PROTONIX) 20 MG tablet Take 20 mg by mouth daily.    Historical Provider, MD   BP 136/87 mmHg  Pulse 92  Temp(Src) 99 F (37.2 C) (Oral)  Resp 17  Ht 5\' 8"  (1.727 m)  Wt 155 lb (70.308 kg)  BMI 23.57 kg/m2  SpO2 98%   Physical Exam  Nursing note and vitals reviewed. General: Well-developed, well-nourished male in no acute distress; appearance consistent with age of record HENT: normocephalic; atraumatic Eyes: pupils equal, round and reactive to light; extraocular muscles intact Neck: supple Heart: regular rate and rhythm; no murmurs, rubs or gallops Lungs: clear to auscultation bilaterally; decreased air movement bilaterally, with coughing on deep breaths Abdomen: soft; nondistended; nontender; no masses or hepatosplenomegaly; bowel sounds present Extremities: No deformity; full range of motion; pulses normal Neurologic: Awake, alert and oriented; motor function intact in all extremities and symmetric; no facial droop Skin: Warm and dry Psychiatric: Normal mood and affect    ED Course  Procedures (including critical care time)   MDM   Nursing notes and vitals signs, including pulse oximetry, reviewed.  Summary of this visit's results, reviewed by myself:  Labs:  Results for orders placed or performed during the hospital encounter of 08/13/15 (from the past 24 hour(s))  Rapid strep screen     Status: Abnormal   Collection Time: 08/14/15 12:50 AM  Result Value Ref Range   Streptococcus, Group A Screen (Direct) POSITIVE (A) NEGATIVE  Urinalysis, Routine w reflex microscopic (not at [facility])     Status: None   Collection Time: 08/14/15 12:50 AM  Result Value Ref Range   Color, Urine YELLOW YELLOW    APPearance CLEAR CLEAR   Specific Gravity, Urine 1.014 1.005 - 1.030   pH 6.0 5.0 - 8.0   Glucose, UA NEGATIVE NEGATIVE mg/dL   Hgb urine dipstick NEGATIVE NEGATIVE   Bilirubin Urine NEGATIVE NEGATIVE   Ketones, ur NEGATIVE NEGATIVE mg/dL   Protein, ur NEGATIVE NEGATIVE mg/dL   Nitrite NEGATIVE NEGATIVE   Leukocytes, UA NEGATIVE NEGATIVE  CBC with Differential/Platelet     Status: Abnormal   Collection Time: 08/14/15 12:54 AM  Result Value Ref Range   WBC 2.0 (L) 4.0 - 10.5 K/uL   RBC 3.96 (L) 4.22 - 5.81 MIL/uL   Hemoglobin 8.9 (L) 13.0 - 17.0 g/dL   HCT 28.3 (L) 39.0 - 52.0 %   MCV 71.5 (L) 78.0 - 100.0 fL   MCH 22.5 (L) 26.0 - 34.0 pg   MCHC 31.4 30.0 - 36.0 g/dL   RDW 23.2 (H) 11.5 - 15.5 %   Platelets 35 (L) 150 - 400 K/uL   Neutrophils Relative % 83 %   Lymphocytes Relative 9 %   Monocytes Relative 7 %   Eosinophils Relative 1 %   Basophils Relative 0 %   Neutro Abs 1.7 1.7 -  7.7 K/uL   Lymphs Abs 0.2 (L) 0.7 - 4.0 K/uL   Monocytes Absolute 0.1 0.1 - 1.0 K/uL   Eosinophils Absolute 0.0 0.0 - 0.7 K/uL   Basophils Absolute 0.0 0.0 - 0.1 K/uL  Basic metabolic panel     Status: Abnormal   Collection Time: 08/14/15 12:54 AM  Result Value Ref Range   Sodium 136 135 - 145 mmol/L   Potassium 3.2 (L) 3.5 - 5.1 mmol/L   Chloride 108 101 - 111 mmol/L   CO2 20 (L) 22 - 32 mmol/L   Glucose, Bld 100 (H) 65 - 99 mg/dL   BUN 13 6 - 20 mg/dL   Creatinine, Ser 0.68 0.61 - 1.24 mg/dL   Calcium 8.0 (L) 8.9 - 10.3 mg/dL   GFR calc non Af Amer >60 >60 mL/min   GFR calc Af Amer >60 >60 mL/min   Anion gap 8 5 - 15    Imaging Studies: Dg Chest 2 View  08/13/2015  CLINICAL DATA:  Acute onset of generalized body aches, fever and cough. Initial encounter. EXAM: CHEST  2 VIEW COMPARISON:  Chest radiograph performed 06/26/2015 FINDINGS: The lungs are well-aerated. Mild vascular congestion is noted. There is no evidence of focal opacification, pleural effusion or pneumothorax. The heart is  normal in size; the mediastinal contour is within normal limits. No acute osseous abnormalities are seen. IMPRESSION: Mild vascular congestion noted.  Lungs remain grossly clear. Electronically Signed   By: Garald Balding M.D.   On: 08/13/2015 23:13     Final diagnoses:  Strep pharyngitis   I personally performed the services described in this documentation, which was scribed in my presence. The recorded information has been reviewed and is accurate.     Shanon Rosser, MD 08/14/15 [phone]

## 2015-08-13 NOTE — ED Notes (Signed)
Pt reports last tx of fever at 2030 with excedrin

## 2015-08-13 NOTE — ED Notes (Signed)
Reports generalized body aches, fever, and cough x 1 week.

## 2015-08-14 LAB — CBC WITH DIFFERENTIAL/PLATELET
BASOS ABS: 0 10*3/uL (ref 0.0–0.1)
Basophils Relative: 0 %
Eosinophils Absolute: 0 10*3/uL (ref 0.0–0.7)
Eosinophils Relative: 1 %
HEMATOCRIT: 28.3 % — AB (ref 39.0–52.0)
Hemoglobin: 8.9 g/dL — ABNORMAL LOW (ref 13.0–17.0)
LYMPHS PCT: 9 %
Lymphs Abs: 0.2 10*3/uL — ABNORMAL LOW (ref 0.7–4.0)
MCH: 22.5 pg — ABNORMAL LOW (ref 26.0–34.0)
MCHC: 31.4 g/dL (ref 30.0–36.0)
MCV: 71.5 fL — AB (ref 78.0–100.0)
MONOS PCT: 7 %
Monocytes Absolute: 0.1 10*3/uL (ref 0.1–1.0)
NEUTROS ABS: 1.7 10*3/uL (ref 1.7–7.7)
Neutrophils Relative %: 83 %
Platelets: 35 10*3/uL — ABNORMAL LOW (ref 150–400)
RBC: 3.96 MIL/uL — AB (ref 4.22–5.81)
RDW: 23.2 % — AB (ref 11.5–15.5)
WBC: 2 10*3/uL — ABNORMAL LOW (ref 4.0–10.5)

## 2015-08-14 LAB — BASIC METABOLIC PANEL
ANION GAP: 8 (ref 5–15)
BUN: 13 mg/dL (ref 6–20)
CO2: 20 mmol/L — AB (ref 22–32)
Calcium: 8 mg/dL — ABNORMAL LOW (ref 8.9–10.3)
Chloride: 108 mmol/L (ref 101–111)
Creatinine, Ser: 0.68 mg/dL (ref 0.61–1.24)
GFR calc Af Amer: >60 mL/min (ref >60)
GFR calc non Af Amer: >60 mL/min (ref >60)
GLUCOSE: 100 mg/dL — AB (ref 65–99)
POTASSIUM: 3.2 mmol/L — AB (ref 3.5–5.1)
Sodium: 136 mmol/L (ref 135–145)

## 2015-08-14 LAB — URINALYSIS, ROUTINE W REFLEX MICROSCOPIC
Bilirubin Urine: NEGATIVE
GLUCOSE, UA: NEGATIVE mg/dL
Hgb urine dipstick: NEGATIVE
Ketones, ur: NEGATIVE mg/dL
LEUKOCYTES UA: NEGATIVE
Nitrite: NEGATIVE
PH: 6 (ref 5.0–8.0)
Protein, ur: NEGATIVE mg/dL
Specific Gravity, Urine: 1.014 (ref 1.005–1.030)

## 2015-08-14 LAB — RAPID STREP SCREEN (MED CTR MEBANE ONLY): STREPTOCOCCUS, GROUP A SCREEN (DIRECT): POSITIVE — AB

## 2015-08-14 MED ORDER — SODIUM CHLORIDE 0.9 % IV BOLUS (SEPSIS)
1000.0000 mL | Freq: Once | INTRAVENOUS | Status: AC
  Administered 2015-08-14: 1000 mL via INTRAVENOUS

## 2015-08-14 MED ORDER — PENICILLIN G BENZATHINE 1200000 UNIT/2ML IM SUSP
1.2000 10*6.[IU] | Freq: Once | INTRAMUSCULAR | Status: AC
  Administered 2015-08-14: 1.2 10*6.[IU] via INTRAMUSCULAR
  Filled 2015-08-14: qty 2

## 2015-08-14 MED ORDER — IPRATROPIUM-ALBUTEROL 0.5-2.5 (3) MG/3ML IN SOLN
3.0000 mL | RESPIRATORY_TRACT | Status: DC
  Administered 2015-08-14: 3 mL via RESPIRATORY_TRACT
  Filled 2015-08-14: qty 3

## 2015-08-14 MED ORDER — KETOROLAC TROMETHAMINE 15 MG/ML IJ SOLN
15.0000 mg | Freq: Once | INTRAMUSCULAR | Status: AC
  Administered 2015-08-14: 15 mg via INTRAVENOUS
  Filled 2015-08-14: qty 1

## 2015-08-14 NOTE — ED Notes (Signed)
Discharged without reaction.

## 2015-08-14 NOTE — ED Notes (Signed)
Pt given d/c instructions as per chart. Verbalizes understanding. No questions. 

## 2015-10-03 ENCOUNTER — Encounter: Payer: Self-pay | Admitting: Hematology and Oncology

## 2015-10-03 ENCOUNTER — Telehealth: Payer: Self-pay | Admitting: Hematology and Oncology

## 2015-10-03 ENCOUNTER — Ambulatory Visit (HOSPITAL_BASED_OUTPATIENT_CLINIC_OR_DEPARTMENT_OTHER): Payer: BLUE CROSS/BLUE SHIELD

## 2015-10-03 ENCOUNTER — Ambulatory Visit (HOSPITAL_BASED_OUTPATIENT_CLINIC_OR_DEPARTMENT_OTHER): Payer: BLUE CROSS/BLUE SHIELD | Admitting: Hematology and Oncology

## 2015-10-03 VITALS — BP 140/97 | HR 71 | Temp 98.1°F | Resp 18 | Ht 68.0 in | Wt 149.0 lb

## 2015-10-03 DIAGNOSIS — K7460 Unspecified cirrhosis of liver: Secondary | ICD-10-CM

## 2015-10-03 DIAGNOSIS — D61818 Other pancytopenia: Secondary | ICD-10-CM

## 2015-10-03 DIAGNOSIS — Z720 Tobacco use: Secondary | ICD-10-CM | POA: Diagnosis not present

## 2015-10-03 LAB — IRON AND TIBC
%SAT: 27 % (ref 20–55)
IRON: 90 ug/dL (ref 42–163)
TIBC: 336 ug/dL (ref 202–409)
UIBC: 246 ug/dL (ref 117–376)

## 2015-10-03 LAB — CBC & DIFF AND RETIC
BASO%: 0.5 % (ref 0.0–2.0)
Basophils Absolute: 0 10*3/uL (ref 0.0–0.1)
EOS ABS: 0.1 10*3/uL (ref 0.0–0.5)
EOS%: 4.3 % (ref 0.0–7.0)
HCT: 37.4 % — ABNORMAL LOW (ref 38.4–49.9)
HEMOGLOBIN: 12.5 g/dL — AB (ref 13.0–17.1)
IMMATURE RETIC FRACT: 4.2 % (ref 3.00–10.60)
LYMPH%: 16.7 % (ref 14.0–49.0)
MCH: 26.1 pg — ABNORMAL LOW (ref 27.2–33.4)
MCHC: 33.4 g/dL (ref 32.0–36.0)
MCV: 78.1 fL — ABNORMAL LOW (ref 79.3–98.0)
MONO#: 0.1 10*3/uL (ref 0.1–0.9)
MONO%: 6.7 % (ref 0.0–14.0)
NEUT%: 71.8 % (ref 39.0–75.0)
NEUTROS ABS: 1.5 10*3/uL (ref 1.5–6.5)
Platelets: 29 10*3/uL — ABNORMAL LOW (ref 140–400)
RBC: 4.79 10*6/uL (ref 4.20–5.82)
RDW: 22.3 % — AB (ref 11.0–14.6)
RETIC %: 1.84 % — AB (ref 0.80–1.80)
RETIC CT ABS: 88.14 10*3/uL (ref 34.80–93.90)
WBC: 2.1 10*3/uL — AB (ref 4.0–10.3)
lymph#: 0.4 10*3/uL — ABNORMAL LOW (ref 0.9–3.3)

## 2015-10-03 LAB — FERRITIN: FERRITIN: 37 ng/mL (ref 22–316)

## 2015-10-03 NOTE — Progress Notes (Signed)
[facility] CONSULT NOTE  Patient Care Team: Tammy Lamonica Boyd, MD as PCP - General (Family Medicine)  CHIEF COMPLAINTS/PURPOSE OF CONSULTATION:  Pancytopenia  HISTORY OF PRESENTING ILLNESS:  Lawrence Brock 45 y.o. male is here because of pancytopenia. Patient has a known diagnosis of cirrhosis of the liver with the portal vein thrombosis and portal hypertension with gastric and esophageal varices. He has had multiple episodes of GI bleeds and had previously received blood transfusions. His blood counts started to decline since 2015 August. His platelet count which was normal prior to that started declining below 100 and it stabilized at around 60 and then later on further dropped to 40,000. His white blood cell count was also fairly normal and then it started to decline and it is now at 2000. He has a history of heavy alcoholism where he would go on binge drinking for weeks at a time. He has since quit drinking alcohol and is trying to live healthier.  I reviewed her records extensively and collaborated the history with the patient.  MEDICAL HISTORY:  Past Medical History  Diagnosis Date  . Pancreatitis   . Chronic back pain   . Chronic pancreatitis ([facility]) 09/10/2013  . Hypertension     No meds yet.  . Pneumonia     10-30-14 [place] admission, no problems now.  . Platelets decreased ([facility])     to continue to be evaluated and followed by PCP  . Anemia   . Abdominal pain     intermittent abdominal pain in lower abdomen    SURGICAL HISTORY: Past Surgical History  Procedure Laterality Date  . Appendectomy    . Esophagogastroduodenoscopy (egd) with propofol N/A 09/14/2013    Procedure: ESOPHAGOGASTRODUODENOSCOPY (EGD) WITH PROPOFOL;  Surgeon: Jay M Pyrtle, MD;  [facility];  Service: Gastroenterology;  Laterality: N/A;  . Gastrointestinal stent removal N/A 09/14/2013    Procedure: GASTROINTESTINAL STENT REMOVAL;  Surgeon: Jay M Pyrtle, MD;  Location: [facility]  ENDOS[facility];  Service: Gastroenterology;  Laterality: N/A;  pancreatic stent removal  . Ercp N/A 02/22/2014    Procedure: ENDOSCOPIC RETROGRADE CHOLANGIOPANCREATOGRAPHY (ERCP);  Surgeon: Malcolm T Stark, MD;  Location: [facility];  Service: Endoscopy;  Laterality: N/A;  . Ercp N/A 02/18/2014    Procedure: ENDOSCOPIC RETROGRADE CHOLANGIOPANCREATOGRAPHY (ERCP);  Surgeon: Robert D Kaplan, MD;  Location: [facility];  Service: Gastroenterology;  Laterality: N/A;  . Endoscopic retrograde cholangiopancreatography (ercp) with propofol N/A 06/02/2014    Procedure: ENDOSCOPIC RETROGRADE CHOLANGIOPANCREATOGRAPHY (ERCP) WITH PROPOFOL;  Surgeon: Daniel P Jacobs, MD;  Location[facility];  Service: Endoscopy;  Laterality: N/A;  . Colonoscopy with propofol N/A 01/05/2015    Procedure: COLONOSCOPY WITH PROPOFOL;  Surgeon: Daniel P Jacobs, MD;  [facility];  Service: Endoscopy;  Laterality: N/A;  . Esophagogastroduodenoscopy (egd) with propofol N/A 01/05/2015    Procedure: ESOPHAGOGASTRODUODENOSCOPY (EGD) WITH PROPOFOL;  Surgeon: Daniel P Jacobs, MD;  [facility];  Service: Endoscopy;  Laterality: N/A;    SOCIAL HISTORY: Social History   Social History  . Marital Status: Married    Spouse Name: N/A  . Number of Children: 4  . Years of Education: N/A   Occupational History  . Chemical Operator    Social History Main Topics  . Smoking status: Current Every Day Smoker -- 0.50 packs/day    Types: Cigarettes  . Smokeless tobacco: Never Used  . Alcohol Use: No     Comment: former ETOH abuse, quit a month ago per patient  . Drug   Use: No  . Sexual Activity: Yes   Other Topics Concern  . Not on file   Social History Narrative    FAMILY HISTORY: Family History  Problem Relation Age of Onset  . Hypertension Mother     ALLERGIES:  has No Known Allergies.  MEDICATIONS:  Current Outpatient Prescriptions  Medication Sig Dispense Refill  . oxycodone (OXY-IR) 5 MG capsule Take 1  capsule (5 mg total) by mouth every 4 (four) hours as needed. (Patient taking differently: Take 5-10 mg by mouth every 4 (four) hours as needed for pain. ) 15 capsule 0  . Pancrelipase, Lip-Prot-Amyl, (ZENPEP) 40000 UNITS CPEP Take 1 capsule by mouth 3 (three) times daily.     . pantoprazole (PROTONIX) 20 MG tablet Take 20 mg by mouth daily.     No current facility-administered medications for this visit.    REVIEW OF SYSTEMS:   Constitutional: Denies fevers, chills or abnormal night sweats Eyes: Denies blurriness of vision, double vision or watery eyes Ears, nose, mouth, throat, and face: Denies mucositis or sore throat Respiratory: Denies cough, dyspnea or wheezes Cardiovascular: Denies palpitation, chest discomfort or lower extremity swelling Gastrointestinal:  Epigastric discomfort Skin: Denies abnormal skin rashes Lymphatics: Denies new lymphadenopathy or easy bruising Neurological:Denies numbness, tingling or new weaknesses Behavioral/Psych: Mood is stable, no new changes  All other systems were reviewed with the patient and are negative.  PHYSICAL EXAMINATION: ECOG PERFORMANCE STATUS: 1 - Symptomatic but completely ambulatory  Filed Vitals:   10/03/15 1231  BP: 140/97  Pulse: 71  Temp: 98.1 F (36.7 C)  Resp: 18   Filed Weights   10/03/15 1231  Weight: 149 lb (67.586 kg)    GENERAL:alert, no distress and comfortable SKIN: skin color, texture, turgor are normal, no rashes or significant lesions EYES: normal, conjunctiva are pink and non-injected, sclera clear OROPHARYNX:no exudate, no erythema and lips, buccal mucosa, and tongue normal  NECK: supple, thyroid normal size, non-tender, without nodularity LYMPH:  no palpable lymphadenopathy in the cervical, axillary or inguinal LUNGS: clear to auscultation and percussion with normal breathing effort HEART: regular rate & rhythm and no murmurs and no lower extremity edema ABDOMEN:Abdominal tenderness to deep palpation, no  hepatosplenomegaly to exam Musculoskeletal:no cyanosis of digits and no clubbing  PSYCH: alert & oriented x 3 with fluent speech NEURO: no focal motor/sensory deficits   LABORATORY DATA:  I have reviewed the data as listed Lab Results  Component Value Date   WBC 2.0* 08/14/2015   HGB 8.9* 08/14/2015   HCT 28.3* 08/14/2015   MCV 71.5* 08/14/2015   PLT 35* 08/14/2015   Lab Results  Component Value Date   NA 136 08/14/2015   K 3.2* 08/14/2015   CL 108 08/14/2015   CO2 20* 08/14/2015    RADIOGRAPHIC STUDIES: I have personally reviewed the radiological reports and agreed with the findings in the report.  ASSESSMENT AND PLAN:  Pancytopenia ([place]) Patient with history of alcoholic cirrhosis, cholangitis, esophageal and gastric varices, presents with chronic severe symptomatic anemia from GI bleeds, chronic pancytopenia with white count of 2.0, hemoglobin 8.9, MCV 71.5, platelets 35, ANC 1700, ALC 200.  Differential diagnosis is most likely related to his chronic cirrhosis with alcoholism. Hypersplenism is also probably contributing to his blood picture. We cannot rule out underlying bone marrow disorders like myelodysplastic syndrome or leukemias without a bone marrow biopsy.  Recommendation: 1. Iron studies, Y-69, folic acid, ANA, erythropoietin levels, CBCD 2. Perform bone marrow aspiration biopsy 3. Ultrasound of the liver  and spleen  Most likely cause of his pancytopenia is prior alcohol-related bone marrow dysfunction. I discussed with him that if that is confirmed, there is not a whole lot we can do to improve his bone marrow function. He is likely to remain pancytopenic for the rest of his life.  Return to clinic after the bone marrow biopsy to discuss the results.  All questions were answered. The patient knows to call the clinic with any problems, questions or concerns.    Rulon Eisenmenger, MD 10/03/2015

## 2015-10-03 NOTE — Assessment & Plan Note (Signed)
Patient with history of alcoholic cirrhosis, cholangitis, esophageal and gastric varices, presents with chronic severe symptomatic anemia from GI bleeds, chronic pancytopenia with white count of 2.0, hemoglobin 8.9, MCV 71.5, platelets 35, ANC 1700, ALC 200.  Differential diagnosis is most likely related to his chronic cirrhosis with alcoholism. Hypersplenism is also probably contributing to his blood picture. We cannot rule out underlying bone marrow disorders like myelodysplastic syndrome or leukemias without a bone marrow biopsy.  Recommendation: 1. Iron studies, P-54, folic acid 2. Perform bone marrow aspiration biopsy  Return to clinic after the bone marrow biopsy to discuss the results.

## 2015-10-03 NOTE — Telephone Encounter (Signed)
appt made and avs printed °

## 2015-10-04 LAB — FOLATE: Folate: >20 ng/mL (ref >3.0)

## 2015-10-04 LAB — ANTINUCLEAR ANTIBODIES, IFA: ANA Titer 1: NEGATIVE

## 2015-10-04 LAB — VITAMIN B12: Vitamin B12: 605 pg/mL (ref 211–946)

## 2015-10-06 ENCOUNTER — Ambulatory Visit (HOSPITAL_COMMUNITY)
Admission: RE | Admit: 2015-10-06 | Discharge: 2015-10-06 | Disposition: A | Discharge status: Home / Self Care | Payer: BLUE CROSS/BLUE SHIELD | Source: Ambulatory Visit | Attending: Hematology and Oncology | Admitting: Hematology and Oncology

## 2015-10-06 DIAGNOSIS — K824 Cholesterolosis of gallbladder: Secondary | ICD-10-CM | POA: Diagnosis not present

## 2015-10-06 DIAGNOSIS — K869 Disease of pancreas, unspecified: Secondary | ICD-10-CM | POA: Diagnosis not present

## 2015-10-06 DIAGNOSIS — R161 Splenomegaly, not elsewhere classified: Secondary | ICD-10-CM | POA: Insufficient documentation

## 2015-10-06 DIAGNOSIS — K829 Disease of gallbladder, unspecified: Secondary | ICD-10-CM | POA: Diagnosis not present

## 2015-10-06 DIAGNOSIS — D61818 Other pancytopenia: Secondary | ICD-10-CM

## 2015-10-12 ENCOUNTER — Other Ambulatory Visit: Payer: Self-pay | Admitting: General Surgery

## 2015-10-13 ENCOUNTER — Encounter (HOSPITAL_COMMUNITY): Payer: Self-pay

## 2015-10-13 ENCOUNTER — Ambulatory Visit (HOSPITAL_COMMUNITY)
Admission: RE | Admit: 2015-10-13 | Discharge: 2015-10-13 | Disposition: A | Discharge status: Home / Self Care | Payer: BLUE CROSS/BLUE SHIELD | Source: Ambulatory Visit | Attending: Hematology and Oncology | Admitting: Hematology and Oncology

## 2015-10-13 DIAGNOSIS — D649 Anemia, unspecified: Secondary | ICD-10-CM | POA: Diagnosis not present

## 2015-10-13 DIAGNOSIS — F17210 Nicotine dependence, cigarettes, uncomplicated: Secondary | ICD-10-CM | POA: Insufficient documentation

## 2015-10-13 DIAGNOSIS — D7589 Other specified diseases of blood and blood-forming organs: Secondary | ICD-10-CM | POA: Diagnosis not present

## 2015-10-13 DIAGNOSIS — M549 Dorsalgia, unspecified: Secondary | ICD-10-CM | POA: Insufficient documentation

## 2015-10-13 DIAGNOSIS — K861 Other chronic pancreatitis: Secondary | ICD-10-CM | POA: Diagnosis not present

## 2015-10-13 DIAGNOSIS — I10 Essential (primary) hypertension: Secondary | ICD-10-CM | POA: Diagnosis not present

## 2015-10-13 DIAGNOSIS — G8929 Other chronic pain: Secondary | ICD-10-CM | POA: Diagnosis not present

## 2015-10-13 DIAGNOSIS — D72822 Plasmacytosis: Secondary | ICD-10-CM | POA: Diagnosis not present

## 2015-10-13 DIAGNOSIS — D61818 Other pancytopenia: Secondary | ICD-10-CM

## 2015-10-13 LAB — CBC
HCT: 39.2 % (ref 39.0–52.0)
Hemoglobin: 13.3 g/dL (ref 13.0–17.0)
MCH: 27 pg (ref 26.0–34.0)
MCHC: 33.9 g/dL (ref 30.0–36.0)
MCV: 79.5 fL (ref 78.0–100.0)
Platelets: 32 10*3/uL — ABNORMAL LOW (ref 150–400)
RBC: 4.93 MIL/uL (ref 4.22–5.81)
RDW: 20.9 % — AB (ref 11.5–15.5)
WBC: 2.4 10*3/uL — ABNORMAL LOW (ref 4.0–10.5)

## 2015-10-13 LAB — APTT: APTT: 33 s (ref 24–37)

## 2015-10-13 LAB — PROTIME-INR
INR: 1.21 (ref 0.00–1.49)
PROTHROMBIN TIME: 15.4 s — AB (ref 11.6–15.2)

## 2015-10-13 LAB — BONE MARROW EXAM

## 2015-10-13 MED ORDER — HYDROCODONE-ACETAMINOPHEN 5-325 MG PO TABS
1.0000 | ORAL_TABLET | ORAL | Status: DC | PRN
  Filled 2015-10-13: qty 2

## 2015-10-13 MED ORDER — MIDAZOLAM HCL 2 MG/2ML IJ SOLN
INTRAMUSCULAR | Status: DC | PRN
  Administered 2015-10-13 (×2): 0.5 mg via INTRAVENOUS
  Administered 2015-10-13 (×2): 1 mg via INTRAVENOUS

## 2015-10-13 MED ORDER — FENTANYL CITRATE (PF) 100 MCG/2ML IJ SOLN
INTRAMUSCULAR | Status: AC
  Filled 2015-10-13: qty 4

## 2015-10-13 MED ORDER — MIDAZOLAM HCL 2 MG/2ML IJ SOLN
INTRAMUSCULAR | Status: AC
Start: 2015-10-13 — End: 2015-10-13
  Filled 2015-10-13: qty 6

## 2015-10-13 MED ORDER — SODIUM CHLORIDE 0.9 % IV SOLN
INTRAVENOUS | Status: DC
  Administered 2015-10-13: 07:00:00 via INTRAVENOUS

## 2015-10-13 MED ORDER — FENTANYL CITRATE (PF) 100 MCG/2ML IJ SOLN
INTRAMUSCULAR | Status: DC | PRN
  Administered 2015-10-13: 50 ug via INTRAVENOUS

## 2015-10-13 NOTE — H&P (Signed)
Chief Complaint: thrombocytopenia  Referring Physician:Dr. Nicholas Lose  Supervising Physician: Jacqulynn Cadet  Patient Status: Out-pt  HPI: Lawrence Brock is an 45 y.o. male with a recent history of thrombocytopenia.  He is currently followed by Dr. Lindi Adie.  He has a known history of cirrhosis with portal HTN and gastric and esophageal varices.  He has also had a decline in his WBC as well.  A request has been made for a bone marrow biopsy to help determine an etiology for these low lab values.  He has no complaints and has otherwise been feeling well.  He did get a blood transfusion in early May and he has felt much better since then.  Past Medical History:  Past Medical History  Diagnosis Date  . Pancreatitis   . Chronic back pain   . Chronic pancreatitis ([place]) 09/10/2013  . Hypertension     No meds yet.  . Pneumonia     10-30-14 [place] [facility] admission, no problems now.  . Platelets decreased ([place])     to continue to be evaluated and followed by PCP  . Anemia   . Abdominal pain     intermittent abdominal pain in lower abdomen    Past Surgical History:  Past Surgical History  Procedure Laterality Date  . Appendectomy    . Esophagogastroduodenoscopy (egd) with propofol N/A 09/14/2013    Procedure: ESOPHAGOGASTRODUODENOSCOPY (EGD) WITH PROPOFOL;  Surgeon: Jerene Bears, MD;  Location: [facility];  Service: Gastroenterology;  Laterality: N/A;  . Gastrointestinal stent removal N/A 09/14/2013    Procedure: GASTROINTESTINAL STENT REMOVAL;  Surgeon: Jerene Bears, MD;  Location: [facility];  Service: Gastroenterology;  Laterality: N/A;  pancreatic stent removal  . Ercp N/A 02/22/2014    Procedure: ENDOSCOPIC RETROGRADE CHOLANGIOPANCREATOGRAPHY (ERCP);  Surgeon: Ladene Artist, MD;  Location: [facility];  Service: Endoscopy;  Laterality: N/A;  . Ercp N/A 02/18/2014    Procedure: ENDOSCOPIC RETROGRADE CHOLANGIOPANCREATOGRAPHY (ERCP);  Surgeon: Inda Castle, MD;  Location: [facility];  Service: Gastroenterology;  Laterality: N/A;  . Endoscopic retrograde cholangiopancreatography (ercp) with propofol N/A 06/02/2014    Procedure: ENDOSCOPIC RETROGRADE CHOLANGIOPANCREATOGRAPHY (ERCP) WITH PROPOFOL;  Surgeon: Milus Banister, MD;  Location: [facility];  Service: Endoscopy;  Laterality: N/A;  . Colonoscopy with propofol N/A 01/05/2015    Procedure: COLONOSCOPY WITH PROPOFOL;  Surgeon: Milus Banister, MD;  Location: [facility];  Service: Endoscopy;  Laterality: N/A;  . Esophagogastroduodenoscopy (egd) with propofol N/A 01/05/2015    Procedure: ESOPHAGOGASTRODUODENOSCOPY (EGD) WITH PROPOFOL;  Surgeon: Milus Banister, MD;  Location: [facility];  Service: Endoscopy;  Laterality: N/A;    Family History:  Family History  Problem Relation Age of Onset  . Hypertension Mother     Social History:  reports that he has been smoking Cigarettes.  He has been smoking about 0.50 packs per day. He has never used smokeless tobacco. He reports that he does not drink alcohol or use illicit drugs.  Allergies: No Known Allergies  Medications:   Medication List    ASK your doctor about these medications        ferrous sulfate 325 (65 FE) MG tablet  Take 325 mg by mouth 2 (two) times daily with a meal.     multivitamin tablet  Take 1 tablet by mouth daily.     oxycodone 5 MG capsule  Commonly known as:  OXY-IR  Take 1 capsule (5 mg total) by mouth every 4 (four) hours as needed.  pantoprazole 20 MG tablet  Commonly known as:  PROTONIX  Take 20 mg by mouth daily.     ZENPEP 40000 units Cpep  Generic drug:  Pancrelipase (Lip-Prot-Amyl)  Take 1 capsule by mouth 3 (three) times daily.        Please HPI for pertinent positives, otherwise complete 10 system ROS negative.  Mallampati Score: MD Evaluation Airway: WNL Heart: WNL Abdomen: WNL Chest/ Lungs: WNL Mallampati/Airway Score: Two  Physical Exam: BP 136/98 mmHg  Pulse 66  Temp(Src) 97.7 F (36.5 C)  (Oral)  Resp 18  Ht _0  (1.727 m)  Wt 149 lb (67.586 kg)  BMI 22.66 kg/m2  SpO2 100% Body mass index is 22.66 kg/(m^2). General: pleasant, WD, WN male who is laying in bed in NAD HEENT: head is normocephalic, atraumatic.  Sclera are noninjected.  PERRL.  Ears and nose without any masses or lesions.  Mouth is pink and moist Heart: regular, rate, and rhythm.  Normal s1,s2. No obvious murmurs, gallops, or rubs noted.  Palpable radial and pedal pulses bilaterally Lungs: CTAB, no wheezes, rhonchi, or rales noted.  Respiratory effort nonlabored Abd: soft, NT, ND, +BS, no masses, hernias, or organomegaly MS: all 4 extremities are symmetrical with no cyanosis, clubbing, or edema. Psych: A&Ox3 with an appropriate affect.   Labs: Results for orders placed or performed during the hospital encounter of 10/13/15 (from the past 48 hour(s))  APTT upon arrival     Status: None   Collection Time: 10/13/15  7:20 AM  Result Value Ref Range   aPTT 33 24 - 37 seconds  CBC upon arrival     Status: Abnormal   Collection Time: 10/13/15  7:20 AM  Result Value Ref Range   WBC 2.4 (L) 4.0 - 10.5 K/uL   RBC 4.93 4.22 - 5.81 MIL/uL   Hemoglobin 13.3 13.0 - 17.0 g/dL   HCT 39.2 39.0 - 52.0 %   MCV 79.5 78.0 - 100.0 fL   MCH 27.0 26.0 - 34.0 pg   MCHC 33.9 30.0 - 36.0 g/dL   RDW 20.9 (H) 11.5 - 15.5 %   Platelets 32 (L) 150 - 400 K/uL    Comment: PLATELET COUNT CONFIRMED BY SMEAR  Protime-INR upon arrival     Status: Abnormal   Collection Time: 10/13/15  7:20 AM  Result Value Ref Range   Prothrombin Time 15.4 (H) 11.6 - 15.2 seconds   INR 1.21 0.00 - 1.49    Imaging: No results found.  Assessment/Plan 1. Thrombocytopenia with leukopenia -we will plan to proceed with a bone marrow biopsy today -his labs and vitals have been reviewed -Risks and Benefits discussed with the patient including, but not limited to bleeding, infection, damage to adjacent structures or low yield requiring additional  tests. All of the patient's questions were answered, patient is agreeable to proceed. Consent signed and in chart.   Thank you for this interesting consult.  I greatly enjoyed meeting Harith Backer and look forward to participating in their care.  A copy of this report was sent to the requesting provider on this date.  Electronically Signed: Henreitta Cea 10/13/2015, 8:35 AM   I spent a total of  30 Minutes  in face to face in clinical consultation, greater than 50% of which was counseling/coordinating care for thrombocytopenia

## 2015-10-13 NOTE — Discharge Instructions (Signed)
Bone Marrow Aspiration and Bone Marrow Biopsy Bone marrow aspiration and bone marrow biopsy are procedures that are done to diagnose blood disorders. You may also have one of these procedures to help diagnose infections or some types of cancer. Bone marrow is the soft tissue that is inside your bones. Blood cells are produced in bone marrow. For bone marrow aspiration, a sample of tissue in liquid form is removed from inside your bone. For a bone marrow biopsy, a small core of bone marrow tissue is removed. Then these samples are examined under a microscope or tested in a lab. You may need these procedures if you have an abnormal complete blood count (CBC). The aspiration or biopsy sample is usually taken from the top of your hip bone. Sometimes, an aspiration sample is taken from your chest bone (sternum). LET [facility] CARE PROVIDER KNOW ABOUT:  Any allergies you have.  All medicines you are taking, including vitamins, herbs, eye drops, creams, and over-the-counter medicines.  Previous problems you or members of your family have had with the use of anesthetics.  Any blood disorders you have.  Previous surgeries you have had.  Any medical conditions you may have.  Whether you are pregnant or you think that you may be pregnant. RISKS AND COMPLICATIONS Generally, this is a safe procedure. However, problems may occur, including:  Infection.  Bleeding. BEFORE THE PROCEDURE  Ask your health care provider about:  Changing or stopping your regular medicines. This is especially important if you are taking diabetes medicines or blood thinners.  Taking medicines such as aspirin and ibuprofen. These medicines can thin your blood. Do not take these medicines before your procedure if your health care provider instructs you not to.  Plan to have someone take you home after the procedure.  If you go home right after the procedure, plan to have someone with you for 24 hours. PROCEDURE   An  IV tube may be inserted into one of your veins.  The injection site will be cleaned with a germ-killing solution (antiseptic).  You will be given one or more of the following:  A medicine that helps you relax (sedative).  A medicine that numbs the area (local anesthetic).  The bone marrow sample will be removed as follows:  For an aspiration, a hollow needle will be inserted through your skin and into your bone. Bone marrow fluid will be drawn up into a syringe.  For a biopsy, your health care provider will use a hollow needle to remove a core of tissue from your bone marrow.  The needle will be removed.  A bandage (dressing) will be placed over the insertion site and taped in place. The procedure may vary among health care providers and hospitals. AFTER THE PROCEDURE  Your blood pressure, heart rate, breathing rate, and blood oxygen level will be monitored often until the medicines you were given have worn off.  Return to your normal activities as directed by your health care provider.   This information is not intended to replace advice given to you by your health care provider. Make sure you discuss any questions you have with your health care provider.   Document Released: 03/14/2004 Document Revised: 07/26/2014 Document Reviewed: 03/02/2014 Elsevier Interactive Patient Education 2016 [place]. Bone Marrow Aspiration and Bone Marrow Biopsy, Care After Refer to this sheet in the next few weeks. These instructions provide you with information about caring for yourself after your procedure. Your health care provider may also give you  more specific instructions. Your treatment has been planned according to current medical practices, but problems sometimes occur. Call your health care provider if you have any problems or questions after your procedure. WHAT TO EXPECT AFTER THE PROCEDURE After your procedure, it is common to have:  Soreness or tenderness around the puncture  site.  Bruising. HOME CARE INSTRUCTIONS  Take medicines only as directed by your health care provider.  Follow your health care provider's instructions about:  Puncture site care.  Bandage (dressing) changes and removal.  Bathe and shower as directed by your health care provider.  Check your puncture site every day for signs of infection. Watch for:  Redness, swelling, or pain.  Fluid, blood, or pus.  Return to your normal activities as directed by your health care provider.  Keep all follow-up visits as directed by your health care provider. This is important. SEEK MEDICAL CARE IF:  You have a fever.  You have uncontrollable bleeding.  You have redness, swelling, or pain at the site of your puncture.  You have fluid, blood, or pus coming from your puncture site.   This information is not intended to replace advice given to you by your health care provider. Make sure you discuss any questions you have with your health care provider.   Document Released: 09/28/2004 Document Revised: 07/26/2014 Document Reviewed: 03/02/2014 Elsevier Interactive Patient Education 2016 Elsevier Inc. Moderate Conscious Sedation, Adult Sedation is the use of medicines to promote relaxation and relieve discomfort and anxiety. Moderate conscious sedation is a type of sedation. Under moderate conscious sedation you are less alert than normal but are still able to respond to instructions or stimulation. Moderate conscious sedation is used during short medical and dental procedures. It is milder than deep sedation or general anesthesia and allows you to return to your regular activities sooner. LET [facility] CARE PROVIDER KNOW ABOUT:   Any allergies you have.  All medicines you are taking, including vitamins, herbs, eye drops, creams, and over-the-counter medicines.  Use of steroids (by mouth or creams).  Previous problems you or members of your family have had with the use of  anesthetics.  Any blood disorders you have.  Previous surgeries you have had.  Medical conditions you have.  Possibility of pregnancy, if this applies.  Use of cigarettes, alcohol, or illegal drugs. RISKS AND COMPLICATIONS Generally, this is a safe procedure. However, as with any procedure, problems can occur. Possible problems include:  Oversedation.  Trouble breathing on your own. You may need to have a breathing tube until you are awake and breathing on your own.  Allergic reaction to any of the medicines used for the procedure. BEFORE THE PROCEDURE  You may have blood tests done. These tests can help show how well your kidneys and liver are working. They can also show how well your blood clots.  A physical exam will be done.  Only take medicines as directed by your health care provider. You may need to stop taking medicines (such as blood thinners, aspirin, or nonsteroidal anti-inflammatory drugs) before the procedure.   Do not eat or drink at least 6 hours before the procedure or as directed by your health care provider.  Arrange for a responsible adult, family member, or friend to take you home after the procedure. He or she should stay with you for at least 24 hours after the procedure, until the medicine has worn off. PROCEDURE   An intravenous (IV) catheter will be inserted into one of your  veins. Medicine will be able to flow directly into your body through this catheter. You may be given medicine through this tube to help prevent pain and help you relax.  The medical or dental procedure will be done. AFTER THE PROCEDURE  You will stay in a recovery area until the medicine has worn off. Your blood pressure and pulse will be checked.   Depending on the procedure you had, you may be allowed to go home when you can tolerate liquids and your pain is under control.   This information is not intended to replace advice given to you by your health care provider. Make  sure you discuss any questions you have with your health care provider.   Document Released: 12/04/2000 Document Revised: 04/01/2014 Document Reviewed: 11/16/2012 Elsevier Interactive Patient Education 2016 [employer].  Open Biopsy of the Bone A bone biopsy is a procedure in which a small sample of bone is removed. In an open biopsy, the sample is removed through an incision. Then, the bone sample is looked at under a microscope to check for abnormalities. The sample is usually taken from a bone that is close to the skin. This procedure may be done to check for various problems with the bone. You may need this procedure if imaging tests or blood tests have indicated a possible problem. This procedure may be done to help determine if a bone tumor is cancerous (malignant). A bone biopsy can help to diagnose problems such as:  Tumors of the bone (sarcomas) and bone marrow (multiple myeloma).  Bone that forms abnormally (Paget disease).  Noncancerous (benign) bone cysts.  Bony growths.  An infection in the bone (osteomyelitis). LET [facility] CARE PROVIDER KNOW ABOUT:  Any allergies you have.  All medicines you are taking, including vitamins, herbs, eye drops, creams, and over-the-counter medicines.  Previous problems you or members of your family have had with the use of anesthetics.  Any blood disorders you have.  Previous surgeries you have had.  Any medical conditions you have. RISKS AND COMPLICATIONS Generally, this is a safe procedure. However, problems may occur, including:  Excessive bleeding.  Infection.  Injury to surrounding tissue. BEFORE THE PROCEDURE  Ask your health care provider about:  Changing or stopping your regular medicines. This is especially important if you are taking diabetes medicines or blood thinners.  Taking medicines such as aspirin and ibuprofen. These medicines can thin your blood. Do not take these medicines before your procedure if your  health care provider instructs you not to.  Follow instructions from your health care provider about eating or drinking restrictions.  Plan to have someone take you home after the procedure.  If you go home right after the procedure, plan to have someone with you for 24 hours.  Ask your health care provider how your incision site will be marked or identified.  You may be given antibiotic medicine to help prevent infection. PROCEDURE  To reduce your risk of infection:  Your health care team will wash or sanitize their hands.  Your skin will be washed with soap.  An IV tube may be inserted into one of your veins.  You will be given one of the following:  A medicine to make you fall asleep (general anesthetic).  A medicine that is injected into an area of your body to numb everything below the injection site (regional anesthetic).  An incision will be made over the biopsy site. A sample of bone tissue will be removed using  surgical tools.  The incision will be closed with stitches (sutures).  A bandage (dressing) will be placed over the incision. The procedure may vary among health care providers and hospitals. AFTER THE PROCEDURE  Your blood pressure, heart rate, breathing rate, and blood oxygen level will be monitored often until the medicines you were given have worn off.   This information is not intended to replace advice given to you by your health care provider. Make sure you discuss any questions you have with your health care provider.   Document Released: 01/19/2004 Document Revised: 11/30/2014 Document Reviewed: 04/18/2014 Elsevier Interactive Patient Education [employer].

## 2015-10-13 NOTE — Procedures (Signed)
Interventional Radiology Procedure Note  Procedure: CT guided aspirate and core biopsy of right iliac bone Complications: None Recommendations: - Bedrest supine x 1 hrs - Hydrocodone PRN  Pain - Follow biopsy results  Signed,  Michaele Amundson K. Linda Biehn, MD   

## 2015-10-18 ENCOUNTER — Telehealth: Payer: Self-pay

## 2015-10-18 NOTE — Telephone Encounter (Signed)
Fax rcvd from [facility] - all normal.  Reviewed by Dr. Lindi Adie.  Sent to scan.

## 2015-10-20 LAB — CHROMOSOME ANALYSIS, BONE MARROW

## 2015-10-20 LAB — TISSUE HYBRIDIZATION (BONE MARROW)-NCBH

## 2015-10-24 ENCOUNTER — Ambulatory Visit (HOSPITAL_BASED_OUTPATIENT_CLINIC_OR_DEPARTMENT_OTHER): Payer: BLUE CROSS/BLUE SHIELD | Admitting: Hematology and Oncology

## 2015-10-24 ENCOUNTER — Encounter: Payer: Self-pay | Admitting: Hematology and Oncology

## 2015-10-24 DIAGNOSIS — D696 Thrombocytopenia, unspecified: Secondary | ICD-10-CM

## 2015-10-24 DIAGNOSIS — D72819 Decreased white blood cell count, unspecified: Secondary | ICD-10-CM

## 2015-10-24 DIAGNOSIS — K7460 Unspecified cirrhosis of liver: Secondary | ICD-10-CM

## 2015-10-24 DIAGNOSIS — D61818 Other pancytopenia: Secondary | ICD-10-CM | POA: Diagnosis not present

## 2015-10-24 DIAGNOSIS — R161 Splenomegaly, not elsewhere classified: Secondary | ICD-10-CM | POA: Diagnosis not present

## 2015-10-24 DIAGNOSIS — R1031 Right lower quadrant pain: Secondary | ICD-10-CM

## 2015-10-24 NOTE — Assessment & Plan Note (Signed)
Patient with history of alcoholic cirrhosis, cholangitis, esophageal and gastric varices, presents with chronic severe symptomatic anemia from GI bleeds, chronic pancytopenia with white count of 2.0, hemoglobin 8.9, MCV 71.5, platelets 35, ANC 1700, ALC 200.  Lab work review 10/03/2015: In a negative, ANC 1500, hemoglobin 12.5, WBC count 2.1, platelets 29, MCV 78.1, absolute reticulocyte count 88, reticulocyte percent 1.84%, ferritin 37, TIBC 336, folate greater than 20, B-12 605.  Bone marrow biopsy 10/17/2015: Normocellular bone marrow 40-60% cellularity with trilineage paraparesis, plasma cell psychosis 10% of cells which are polyclonal staining for Kappa and lambda. No evidence of leukemia or lymphoma. No evidence of MDS MDS FISH panel pending  Pathology review: I discussed the pathology report in great detail the patient provided with a copy of this report. Based on this report I attribute to his thrombocytopenia to his liver cirrhosis. There is no clear-cut underlying bone marrow pathology for his leukopenia and thrombocytopenia. We also ruled out autoimmune diseases like lupus.  Recommendation: Continue monitoring of his CBC periodically. If he has bleeding problems on platelet transfusions would be helpful. Primarily the treatment is supportive care.

## 2015-10-24 NOTE — Progress Notes (Signed)
Patient Care Team: Tammy Arn Medal, MD as PCP - General (Family Medicine)  DIAGNOSIS: Leukopenia and thrombocytopenia  CHIEF COMPLIANT: Follow-up of leukopenia and thrombocytopenia after recent bone marrow biopsy  INTERVAL HISTORY: Lawrence Brock is a 45 year old gentleman with a history of alcohol abuse who presented with long-standing thrombocytopenia and leukopenia. He underwent a recent bone marrow biopsy is here today to discuss results completed by his children. He is complaining of right lower quadrant abdominal pain for the past 2 weeks it is intermittent in nature. It is not associated with any food or digestion. He had a bowel movement earlier today. He has black colored stools from taking oral iron. He has not noticed any bright red blood per rectum. He had a previous appendectomy.  REVIEW OF SYSTEMS:   Constitutional: Denies fevers, chills or abnormal weight loss Eyes: Denies blurriness of vision Ears, nose, mouth, throat, and face: Denies mucositis or sore throat Respiratory: Denies cough, dyspnea or wheezes Cardiovascular: Denies palpitation, chest discomfort Gastrointestinal:  Abdominal pain right lower quadrant Skin: Denies abnormal skin rashes Lymphatics: Denies new lymphadenopathy or easy bruising Neurological:Denies numbness, tingling or new weaknesses Behavioral/Psych: Mood is stable, no new changes  Extremities: No lower extremity edema  All other systems were reviewed with the patient and are negative.  I have reviewed the past medical history, past surgical history, social history and family history with the patient and they are unchanged from previous note.  ALLERGIES:  has No Known Allergies.  MEDICATIONS:  Current Outpatient Prescriptions  Medication Sig Dispense Refill  . ferrous sulfate 325 (65 FE) MG tablet Take 325 mg by mouth 2 (two) times daily with a meal.    . Multiple Vitamin (MULTIVITAMIN) tablet Take 1 tablet by mouth daily.    Marland Kitchen oxycodone  (OXY-IR) 5 MG capsule Take 1 capsule (5 mg total) by mouth every 4 (four) hours as needed. (Patient taking differently: Take 5-10 mg by mouth every 4 (four) hours as needed for pain. ) 15 capsule 0  . Pancrelipase, Lip-Prot-Amyl, (ZENPEP) 40000 UNITS CPEP Take 1 capsule by mouth 3 (three) times daily.     . pantoprazole (PROTONIX) 20 MG tablet Take 20 mg by mouth daily.     No current facility-administered medications for this visit.     PHYSICAL EXAMINATION: ECOG PERFORMANCE STATUS: 1 - Symptomatic but completely ambulatory  Vitals:   10/24/15 1347  BP: (!) 131/99  Pulse: 74  Resp: 18  Temp: 98.1 F (36.7 C)   Filed Weights   10/24/15 1347  Weight: 147 lb 9.6 oz (67 kg)    GENERAL:alert, no distress and comfortable SKIN: skin color, texture, turgor are normal, no rashes or significant lesions EYES: normal, Conjunctiva are pink and non-injected, sclera clear OROPHARYNX:no exudate, no erythema and lips, buccal mucosa, and tongue normal  NECK: supple, thyroid normal size, non-tender, without nodularity LYMPH:  no palpable lymphadenopathy in the cervical, axillary or inguinal LUNGS: clear to auscultation and percussion with normal breathing effort HEART: regular rate & rhythm and no murmurs and no lower extremity edema ABDOMEN:Right lower quadrant tenderness, no hepatosplenomegaly MUSCULOSKELETAL:no cyanosis of digits and no clubbing  NEURO: alert & oriented x 3 with fluent speech, no focal motor/sensory deficits EXTREMITIES: No lower extremity edema  LABORATORY DATA:  I have reviewed the data as listed   Chemistry      Component Value Date/Time   NA 136 08/14/2015 0054   K 3.2 (L) 08/14/2015 0054   CL 108 08/14/2015 0054   CO2  20 (L) 08/14/2015 0054   BUN 13 08/14/2015 0054   CREATININE 0.68 08/14/2015 0054      Component Value Date/Time   CALCIUM 8.0 (L) 08/14/2015 0054   ALKPHOS 52 06/27/2015 0327   AST 20 06/27/2015 0327   ALT 22 06/27/2015 0327   BILITOT 5.4 (H)  06/27/2015 0327       Lab Results  Component Value Date   WBC 2.4 (L) 10/13/2015   HGB 13.3 10/13/2015   HCT 39.2 10/13/2015   MCV 79.5 10/13/2015   PLT 32 (L) 10/13/2015   NEUTROABS 1.5 10/03/2015     ASSESSMENT & PLAN:  Pancytopenia ([place]) Patient with history of alcoholic cirrhosis, cholangitis, esophageal and gastric varices, presents with chronic severe symptomatic anemia from GI bleeds, chronic pancytopenia with white count of 2.0, hemoglobin 8.9, MCV 71.5, platelets 35, ANC 1700, ALC 200.  Lab work review 10/03/2015: In a negative, ANC 1500, hemoglobin 12.5, WBC count 2.1, platelets 29, MCV 78.1, absolute reticulocyte count 88, reticulocyte percent 1.84%, ferritin 37, TIBC 336, folate greater than 20, B-12 605.  Bone marrow biopsy 10/17/2015: Normocellular bone marrow 40-60% cellularity with trilineage paraparesis, plasma cell psychosis 10% of cells which are polyclonal staining for Kappa and lambda. No evidence of leukemia or lymphoma. No evidence of MDS MDS FISH panel pending  Ultrasound of liver and spleen: Revealed splenomegaly, no evidence of liver cirrhosis. There are signs of portal hypertension.  Pathology review: I discussed the pathology report in great detail the patient provided with a copy of this report. Based on this report I attribute to his thrombocytopenia to his splenomegaly. There is no clear-cut underlying bone marrow pathology for his leukopenia and thrombocytopenia. We also ruled out autoimmune diseases like lupus.  Recommendation: Continue monitoring of his CBC periodically. If he has bleeding problems on platelet transfusions would be helpful. Primarily the treatment is supportive care.   Orders Placed This Encounter  Procedures  . CBC with Differential    Standing Status:   Future    Standing Expiration Date:   10/23/2016  . Comprehensive metabolic panel    Standing Status:   Future    Standing Expiration Date:   10/23/2016   The patient has a good  understanding of the overall plan. he agrees with it. he will call with any problems that may develop before the next visit here.   Rulon Eisenmenger, MD 10/24/15

## 2015-11-21 ENCOUNTER — Encounter (HOSPITAL_COMMUNITY): Payer: Self-pay

## 2016-04-21 ENCOUNTER — Telehealth: Payer: Self-pay

## 2016-04-21 NOTE — Telephone Encounter (Signed)
Called patient to reschedule his 04/25/16 appt due to call day and he states that he would like to call back and r/s as he is working two jobs

## 2016-04-25 ENCOUNTER — Ambulatory Visit: Payer: BLUE CROSS/BLUE SHIELD | Admitting: Hematology and Oncology

## 2016-04-25 ENCOUNTER — Other Ambulatory Visit: Payer: BLUE CROSS/BLUE SHIELD

## 2016-07-02 ENCOUNTER — Encounter (HOSPITAL_COMMUNITY): Payer: Self-pay | Admitting: Emergency Medicine

## 2016-07-02 ENCOUNTER — Emergency Department (HOSPITAL_COMMUNITY)
Admission: EM | Admit: 2016-07-02 | Discharge: 2016-07-02 | Disposition: A | Discharge status: Home / Self Care | Payer: BLUE CROSS/BLUE SHIELD | Attending: Emergency Medicine | Admitting: Emergency Medicine

## 2016-07-02 ENCOUNTER — Emergency Department (HOSPITAL_COMMUNITY): Payer: BLUE CROSS/BLUE SHIELD

## 2016-07-02 DIAGNOSIS — I10 Essential (primary) hypertension: Secondary | ICD-10-CM | POA: Diagnosis not present

## 2016-07-02 DIAGNOSIS — Z79899 Other long term (current) drug therapy: Secondary | ICD-10-CM | POA: Insufficient documentation

## 2016-07-02 DIAGNOSIS — R1010 Upper abdominal pain, unspecified: Secondary | ICD-10-CM | POA: Diagnosis present

## 2016-07-02 DIAGNOSIS — R1013 Epigastric pain: Secondary | ICD-10-CM | POA: Insufficient documentation

## 2016-07-02 DIAGNOSIS — F17210 Nicotine dependence, cigarettes, uncomplicated: Secondary | ICD-10-CM | POA: Diagnosis not present

## 2016-07-02 LAB — COMPREHENSIVE METABOLIC PANEL
ALBUMIN: 4.3 g/dL (ref 3.5–5.0)
ALK PHOS: 99 U/L (ref 38–126)
ALT: 39 U/L (ref 17–63)
ANION GAP: 8 (ref 5–15)
AST: 38 U/L (ref 15–41)
BUN: 27 mg/dL — AB (ref 6–20)
CALCIUM: 9 mg/dL (ref 8.9–10.3)
CO2: 23 mmol/L (ref 22–32)
Chloride: 111 mmol/L (ref 101–111)
Creatinine, Ser: 1.09 mg/dL (ref 0.61–1.24)
GFR calc Af Amer: >60 mL/min (ref >60)
GFR calc non Af Amer: >60 mL/min (ref >60)
GLUCOSE: 125 mg/dL — AB (ref 65–99)
Potassium: 3.4 mmol/L — ABNORMAL LOW (ref 3.5–5.1)
SODIUM: 142 mmol/L (ref 135–145)
Total Bilirubin: 2.3 mg/dL — ABNORMAL HIGH (ref 0.3–1.2)
Total Protein: 7.4 g/dL (ref 6.5–8.1)

## 2016-07-02 LAB — LIPASE, BLOOD: Lipase: 12 U/L (ref 11–51)

## 2016-07-02 LAB — CBC
HCT: 42.8 % (ref 39.0–52.0)
HEMOGLOBIN: 15.2 g/dL (ref 13.0–17.0)
MCH: 30 pg (ref 26.0–34.0)
MCHC: 35.5 g/dL (ref 30.0–36.0)
MCV: 84.4 fL (ref 78.0–100.0)
Platelets: 41 10*3/uL — ABNORMAL LOW (ref 150–400)
RBC: 5.07 MIL/uL (ref 4.22–5.81)
RDW: 15.3 % (ref 11.5–15.5)
WBC: 6.5 10*3/uL (ref 4.0–10.5)

## 2016-07-02 LAB — SAMPLE TO BLOOD BANK

## 2016-07-02 LAB — URINALYSIS, ROUTINE W REFLEX MICROSCOPIC
BILIRUBIN URINE: NEGATIVE
Glucose, UA: NEGATIVE mg/dL
HGB URINE DIPSTICK: NEGATIVE
Ketones, ur: 20 mg/dL — AB
Leukocytes, UA: NEGATIVE
Nitrite: NEGATIVE
PH: 5 (ref 5.0–8.0)
Protein, ur: NEGATIVE mg/dL

## 2016-07-02 MED ORDER — HYDROMORPHONE HCL 2 MG/ML IJ SOLN
0.5000 mg | INTRAMUSCULAR | Status: AC | PRN
  Administered 2016-07-02 (×3): 0.5 mg via INTRAVENOUS
  Filled 2016-07-02 (×3): qty 1

## 2016-07-02 MED ORDER — ONDANSETRON HCL 4 MG/2ML IJ SOLN
4.0000 mg | Freq: Once | INTRAMUSCULAR | Status: AC
  Administered 2016-07-02: 4 mg via INTRAVENOUS
  Filled 2016-07-02: qty 2

## 2016-07-02 MED ORDER — SODIUM CHLORIDE 0.9 % IV BOLUS (SEPSIS)
1000.0000 mL | Freq: Once | INTRAVENOUS | Status: AC
  Administered 2016-07-02: 1000 mL via INTRAVENOUS

## 2016-07-02 MED ORDER — SODIUM CHLORIDE 0.9 % IV SOLN
INTRAVENOUS | Status: DC
  Administered 2016-07-02 (×2): via INTRAVENOUS

## 2016-07-02 MED ORDER — ONDANSETRON 8 MG PO TBDP: 8.0000 mg | ORAL_TABLET | Freq: Three times a day (TID) | ORAL | 0 refills | Status: DC | PRN

## 2016-07-02 MED ORDER — SUCRALFATE 1 G PO TABS: 1.0000 g | ORAL_TABLET | Freq: Three times a day (TID) | ORAL | 0 refills | Status: DC

## 2016-07-02 MED ORDER — IOPAMIDOL (ISOVUE-300) INJECTION 61%
INTRAVENOUS | Status: AC
  Administered 2016-07-02: 100 mL via INTRAVENOUS
  Filled 2016-07-02: qty 100

## 2016-07-02 NOTE — ED Notes (Signed)
PT given urinal and made aware urine sample is needed.

## 2016-07-02 NOTE — ED Notes (Signed)
Pt unable to provide urine specimen at this time

## 2016-07-02 NOTE — ED Notes (Signed)
Pt reminded of need for urine specimen 

## 2016-07-02 NOTE — ED Notes (Signed)
Pt lying in bed holding abdomen guarded. Pt continues to  have pain 10/10 pt was give dilaudid for management.

## 2016-07-02 NOTE — ED Notes (Signed)
Pt aware a urine sample is needed. Stated he could not provide one at this time.

## 2016-07-02 NOTE — ED Triage Notes (Signed)
Patient states that he has pancreatitis.  Patients states that since Friday having dark blood color in urine and had blood in stools and when he wipes occasionally.  Patient states that has loose stool after every time he eats.

## 2016-07-02 NOTE — Discharge Instructions (Signed)
Continue your current medications, follow-up with your primary care doctor and/or GI doctor. Monitor for fever or worsening symptoms

## 2016-07-02 NOTE — ED Provider Notes (Signed)
[facility] DEPT Provider Note   CSN: [id number] Arrival date & time: 07/02/16  1629   History   Chief Complaint Chief Complaint  Patient presents with  . Hematemesis  . Diarrhea  . Abdominal Pain    HPI Lawrence Brock is a 46 y.o. male.  HPI Pt has history of pancreatitis.  He has history of pancreatic stent.  Since Friday he has been having more pain. The pain is in the upper abdomen.  It does not radiate.  The pain comes and goes in waves. When it is severe it makes him bend over.  He has noticed dark color in his urine.  He has noticed some blood in his stool when he wipes.  No vomiting. No fever.  He has an appointment with his GI doctor ni June.  He states he called his doctor last Thursday but there is no record in the computer of the call. Past Medical History:  Diagnosis Date  . Abdominal pain    intermittent abdominal pain in lower abdomen  . Anemia   . Chronic back pain   . Chronic pancreatitis ([place]) 09/10/2013  . Hypertension    No meds yet.  . Pancreatitis   . Platelets decreased ([place])    to continue to be evaluated and followed by PCP  . Pneumonia    10-30-14 [facility] admission, no problems now.    Patient Active Problem List   Diagnosis Date Noted  . Anemia, iron deficiency   . Alcoholic cirrhosis of liver without ascites ([place])   . Anemia 06/26/2015  . Bleeding gastrointestinal 12/29/2014  . Absolute anemia 12/29/2014  . Portal vein thrombosis 12/29/2014  . CAP (community acquired pneumonia) 10/31/2014  . Pancytopenia ([place]) 10/31/2014  . Pancreatitis 10/31/2014  . Cholangitis   . Biliary obstruction 02/22/2014  . 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 Acute cholangitis 02/18/2014  . Common bile duct stricture 02/18/2014  . Choledocholithiasis 02/18/2014  . Hepatic abscess 02/16/2014  . Elevated LFTs 02/15/2014  . Other specified fever 02/15/2014  . Duodenal ulcer 09/14/2013  . Fever 09/10/2013  . Chronic pancreatitis ([place]) 09/10/2013    . Hypokalemia 09/09/2013  . Thrombocytopenia, unspecified 09/09/2013  . Normocytic anemia 09/09/2013  . Pancreatic pseudocyst 09/09/2013  . Unspecified constipation 09/09/2013  . Acute pancreatitis 09/08/2013    Past Surgical History:  Procedure Laterality Date  . APPENDECTOMY    . COLONOSCOPY WITH PROPOFOL N/A 01/05/2015   Procedure: COLONOSCOPY WITH PROPOFOL;  Surgeon: Milus Banister, MD;  Location: [facility] ENDOSCOPY;  Service: Endoscopy;  Laterality: N/A;  . ENDOSCOPIC RETROGRADE CHOLANGIOPANCREATOGRAPHY (ERCP) WITH PROPOFOL N/A 06/02/2014   Procedure: ENDOSCOPIC RETROGRADE CHOLANGIOPANCREATOGRAPHY (ERCP) WITH PROPOFOL;  Surgeon: Milus Banister, MD;  Location: [facility] ENDOSCOPY;  Service: Endoscopy;  Laterality: N/A;  . ERCP N/A 02/22/2014   Procedure: ENDOSCOPIC RETROGRADE CHOLANGIOPANCREATOGRAPHY (ERCP);  Surgeon: Ladene Artist, MD;  Location: [facility];  Service: Endoscopy;  Laterality: N/A;  . ERCP N/A 02/18/2014   Procedure: ENDOSCOPIC RETROGRADE CHOLANGIOPANCREATOGRAPHY (ERCP);  Surgeon: Inda Castle, MD;  Location: [facility] ORS;  Service: Gastroenterology;  Laterality: N/A;  . ESOPHAGOGASTRODUODENOSCOPY (EGD) WITH PROPOFOL N/A 09/14/2013   Procedure: ESOPHAGOGASTRODUODENOSCOPY (EGD) WITH PROPOFOL;  Surgeon: Jerene Bears, MD;  Location: [facility] END[facility];  Service: Gastroenterology;  Laterality: N/A;  . ESOPHAGOGASTRODUODENOSCOPY (EGD) WITH PROPOFOL N/A 01/05/2015   Procedure: ESOPHAGOGASTRODUODENOSCOPY (EGD) WITH PROPOFOL;  Surgeon: Milus Banister, MD;  Location: [facility] END[facility];  Service: Endoscopy;  Laterality: N/A;  . GASTROINTESTINAL STENT REMOVAL N/A 09/14/2013  Procedure: GASTROINTESTINAL STENT REMOVAL;  Surgeon: Jerene Bears, MD;  Location: [facility] ENDOS[facility];  Service: Gastroenterology;  Laterality: N/A;  pancreatic stent removal       Home Medications    Prior to Admission medications   Medication Sig Start Date End Date Taking? Authorizing Provider  Multiple Vitamin (MULTIVITAMIN)  tablet Take 1 tablet by mouth daily.   Yes Historical Provider, MD  Pancrelipase, Lip-Prot-Amyl, (ZENPEP) 40000 UNITS CPEP Take 1 capsule by mouth 3 (three) times daily.    Yes Historical Provider, MD  pantoprazole (PROTONIX) 40 MG tablet Take 40 mg by mouth daily.  06/08/16  Yes Historical Provider, MD  ondansetron (ZOFRAN ODT) 8 MG disintegrating tablet Take 1 tablet (8 mg total) by mouth every 8 (eight) hours as needed for nausea or vomiting. 07/02/16   Dorie Rank, MD  oxycodone (OXY-IR) 5 MG capsule Take 1 capsule (5 mg total) by mouth every 4 (four) hours as needed. Patient not taking: Reported on 07/02/2016 95/0/93   Delora Fuel, MD  sucralfate (CARAFATE) 1 g tablet Take 1 tablet (1 g total) by mouth 4 (four) times daily -  with meals and at bedtime. 07/02/16   Dorie Rank, MD    Family History Family History  Problem Relation Age of Onset  . Hypertension Mother     Social History Social History  Substance Use Topics  . Smoking status: Current Every Day Smoker    Packs/day: 0.50    Types: Cigarettes  . Smokeless tobacco: Never Used  . Alcohol use No     Comment: former ETOH abuse, quit a month ago per patient     Allergies   Patient has no known allergies.   Review of Systems Review of Systems  All other systems reviewed and are negative.    Physical Exam Updated Vital Signs BP 113/78 (BP Location: Left Arm)   Pulse 76   Temp 97.5 F (36.4 C) (Oral)   Resp 19   Ht 5\' 8"  (1.727 m)   Wt 63.5 kg   SpO2 94%   BMI 21.29 kg/m   Physical Exam  Constitutional: He appears well-developed and well-nourished. No distress.  Appears to be in pain  HENT:  Head: Normocephalic and atraumatic.  Right Ear: External ear normal.  Left Ear: External ear normal.  Eyes: Conjunctivae are normal. Right eye exhibits no discharge. Left eye exhibits no discharge. No scleral icterus.  Neck: Neck supple. No tracheal deviation present.  Cardiovascular: Normal rate, regular rhythm and intact  distal pulses.   Pulmonary/Chest: Effort normal and breath sounds normal. No stridor. No respiratory distress. He has no wheezes. He has no rales.  Abdominal: Soft. Bowel sounds are normal. He exhibits no distension and no mass. There is tenderness in the epigastric area. There is guarding. There is no rebound. No hernia.  Musculoskeletal: He exhibits no edema or tenderness.  Neurological: He is alert. He has normal strength. No cranial nerve deficit (no facial droop, extraocular movements intact, no slurred speech) or sensory deficit. He exhibits normal muscle tone. He displays no seizure activity. Coordination normal.  Skin: Skin is warm and dry. No rash noted.  Psychiatric: He has a normal mood and affect.  Nursing note and vitals reviewed.    ED Treatments / Results  Labs (all labs ordered are listed, but only abnormal results are displayed) Labs Reviewed  COMPREHENSIVE METABOLIC PANEL - Abnormal; Notable for the following:       Result Value   Potassium 3.4 (*)  Glucose, Bld 125 (*)    BUN 27 (*)    Total Bilirubin 2.3 (*)    All other components within normal limits  CBC - Abnormal; Notable for the following:    Platelets 41 (*)    All other components within normal limits  URINALYSIS, ROUTINE W REFLEX MICROSCOPIC - Abnormal; Notable for the following:    Specific Gravity, Urine >1.046 (*)    Ketones, ur 20 (*)    All other components within normal limits  LIPASE, BLOOD  SAMPLE TO BLOOD BANK      Radiology Ct Abdomen Pelvis W Contrast  Result Date: 07/02/2016 CLINICAL DATA:  Pancreatitis.  Rectal bleeding.  Abdominal pain. EXAM: CT ABDOMEN AND PELVIS WITH CONTRAST TECHNIQUE: Multidetector CT imaging of the abdomen and pelvis was performed using the standard protocol following bolus administration of intravenous contrast. CONTRAST:  123mL ISOVUE-300 IOPAMIDOL (ISOVUE-300) INJECTION 61% COMPARISON:  10/30/2014 FINDINGS: Lower chest: Normal except for rare prominent  esophageal varices. Hepatobiliary: Fatty change of the liver. Few small liver cysts. No calcified gallstones. Chronic intra and extra hepatic biliary ductal prominence. Chronic portal vein thrombosis with cavernous transformation. Pancreas: Chronic pancreatic atrophy in calcification. Chronic prominence of the pancreatic duct. No definite sign pancreatic inflammation. Spleen: Splenomegaly. Adrenals/Urinary Tract: Adrenal glands are normal. Kidneys are normal except for a few small benign cysts. Stomach/Bowel: Thick-walled stomach which could go along with acid peptic disease. No sign of obstruction. Vascular/Lymphatic: Aortic atherosclerosis. No aneurysm. IVC is normal. No retroperitoneal mass or adenopathy. Reproductive: Normal Other: Edema throughout the mesentery. Cannot rule out partial thrombosis and superior mesenteric vein branches. This is not definite however. Mesenteric edema was present on the previous study of August 2016 and may be chronic. Musculoskeletal: Normal IMPRESSION: Question gastritis or acid peptic disease. Chronic portal vein thrombosis with cavernous transformation. Marked upper abdominal varices including prominent esophageal varices. Chronic splenomegaly. Chronic biliary ductal prominence, probably secondary to changes of chronic pancreatitis. No sign of active pancreatitis. Edema pattern in the mesentery, similar to the study of August 2016. This may be chronic. Question some changes of chronic thrombosis in superior mesenteric artery branches. Electronically Signed   By: Nelson Chimes M.D.   On: 07/02/2016 20:31   Dg Abd Acute W/chest  Result Date: 07/02/2016 CLINICAL DATA:  Blood colored urine and blood in stools. EXAM: DG ABDOMEN ACUTE W/ 1V CHEST COMPARISON:  Chest CT 10/31/2014, CT abdomen and pelvis 10/30/2014 FINDINGS: A few scattered air containing small bowel loops are noted on the decubitus view within the right hemiabdomen and left lower quadrant without obstruction seen.  No free air is noted. Right upper quadrant calcifications associated with the pancreas are again seen in the right upper quadrant similar to prior CT abdomen 2016. Slight uncoiling of the thoracic aorta without aneurysm. No pneumonic consolidation, effusion or pneumothorax. Summation of osseous elements at the lung apices slightly limit assessment of lung parenchyma. IMPRESSION: 1. No bowel obstruction. A few scattered air containing small bowel loops are noted in a nonspecific bowel gas pattern. A mild enteritis might account for this. No free air is seen. 2. No active cardiopulmonary disease. Electronically Signed   By: Ashley Royalty M.D.   On: 07/02/2016 19:09    Procedures Procedures (including critical care time)  Medications Ordered in ED Medications  sodium chloride 0.9 % bolus 1,000 mL (0 mLs Intravenous Stopped 07/02/16 1933)    And  0.9 %  sodium chloride infusion ( Intravenous Stopped 07/02/16 2202)  HYDROmorphone (DILAUDID) injection 0.5  mg (0.5 mg Intravenous Given 07/02/16 2056)  ondansetron (ZOFRAN) injection 4 mg (4 mg Intravenous Given 07/02/16 1821)  iopamidol (ISOVUE-300) 61 % injection (100 mLs Intravenous Contrast Given 07/02/16 2001)     Initial Impression / Assessment and Plan / ED Course  I have reviewed the triage vital signs and the nursing notes.  Pertinent labs & imaging results that were available during my care of the patient were reviewed by me and considered in my medical decision making (see chart for details).   patient presented to the emergency room with complaints of recurrent abdominal pain.    He has history of chronic pancreatitis and cirrhosis associated with alcohol use.  CT scan suggests the possibility of gastritis. No evidence of acute pancreatitis. Chronic findings associated with chronic pancreatitis, cirrhosis and chronic portal vein thrombosis.  I doubt any acute issues associated with cirrhosis and pancreatitis. We'll add on Carafate to his PPI.  Recommended follow-up with his GI doctor.  At this time there does not appear to be any evidence of an acute emergency medical condition and the patient appears stable for discharge with appropriate outpatient follow up.  Final Clinical Impressions(s) / ED Diagnoses   Final diagnoses:  Epigastric pain    New Prescriptions New Prescriptions   ONDANSETRON (ZOFRAN ODT) 8 MG DISINTEGRATING TABLET    Take 1 tablet (8 mg total) by mouth every 8 (eight) hours as needed for nausea or vomiting.   SUCRALFATE (CARAFATE) 1 G TABLET    Take 1 tablet (1 g total) by mouth 4 (four) times daily -  with meals and at bedtime.     Dorie Rank, MD 07/02/16 [phone]

## 2016-07-02 NOTE — ED Notes (Signed)
Pt aware of need for urine specimen. 

## 2016-07-24 NOTE — Progress Notes (Signed)
This encounter was created in error - please disregard.

## 2016-08-19 ENCOUNTER — Encounter (HOSPITAL_COMMUNITY)
Admission: EM | Discharge status: Against Medical Advice | Payer: Self-pay | Source: Home / Self Care | Attending: Internal Medicine

## 2016-08-19 ENCOUNTER — Inpatient Hospital Stay (HOSPITAL_COMMUNITY): Payer: BLUE CROSS/BLUE SHIELD | Admitting: Anesthesiology

## 2016-08-19 ENCOUNTER — Inpatient Hospital Stay (HOSPITAL_COMMUNITY): Payer: BLUE CROSS/BLUE SHIELD

## 2016-08-19 ENCOUNTER — Inpatient Hospital Stay (HOSPITAL_COMMUNITY)
Admission: EM | Admit: 2016-08-19 | Discharge: 2016-08-21 | DRG: 432 | Discharge status: Against Medical Advice | Payer: BLUE CROSS/BLUE SHIELD | Attending: Internal Medicine | Admitting: Internal Medicine

## 2016-08-19 ENCOUNTER — Encounter (HOSPITAL_COMMUNITY): Payer: Self-pay | Admitting: Emergency Medicine

## 2016-08-19 DIAGNOSIS — K861 Other chronic pancreatitis: Secondary | ICD-10-CM | POA: Diagnosis present

## 2016-08-19 DIAGNOSIS — D696 Thrombocytopenia, unspecified: Secondary | ICD-10-CM | POA: Diagnosis not present

## 2016-08-19 DIAGNOSIS — D500 Iron deficiency anemia secondary to blood loss (chronic): Secondary | ICD-10-CM | POA: Diagnosis not present

## 2016-08-19 DIAGNOSIS — K269 Duodenal ulcer, unspecified as acute or chronic, without hemorrhage or perforation: Secondary | ICD-10-CM | POA: Diagnosis present

## 2016-08-19 DIAGNOSIS — I81 Portal vein thrombosis: Secondary | ICD-10-CM | POA: Diagnosis present

## 2016-08-19 DIAGNOSIS — I8501 Esophageal varices with bleeding: Secondary | ICD-10-CM | POA: Diagnosis not present

## 2016-08-19 DIAGNOSIS — K2970 Gastritis, unspecified, without bleeding: Secondary | ICD-10-CM | POA: Diagnosis present

## 2016-08-19 DIAGNOSIS — D62 Acute posthemorrhagic anemia: Secondary | ICD-10-CM | POA: Diagnosis present

## 2016-08-19 DIAGNOSIS — K920 Hematemesis: Secondary | ICD-10-CM | POA: Diagnosis present

## 2016-08-19 DIAGNOSIS — I8511 Secondary esophageal varices with bleeding: Secondary | ICD-10-CM | POA: Diagnosis present

## 2016-08-19 DIAGNOSIS — K922 Gastrointestinal hemorrhage, unspecified: Secondary | ICD-10-CM | POA: Diagnosis present

## 2016-08-19 DIAGNOSIS — D508 Other iron deficiency anemias: Secondary | ICD-10-CM | POA: Diagnosis not present

## 2016-08-19 DIAGNOSIS — D649 Anemia, unspecified: Secondary | ICD-10-CM | POA: Diagnosis present

## 2016-08-19 DIAGNOSIS — K7030 Alcoholic cirrhosis of liver without ascites: Secondary | ICD-10-CM | POA: Diagnosis present

## 2016-08-19 DIAGNOSIS — D61818 Other pancytopenia: Secondary | ICD-10-CM | POA: Diagnosis present

## 2016-08-19 DIAGNOSIS — F17210 Nicotine dependence, cigarettes, uncomplicated: Secondary | ICD-10-CM | POA: Diagnosis present

## 2016-08-19 HISTORY — DX: Portal vein thrombosis: I81

## 2016-08-19 HISTORY — DX: Other cholangitis: K83.09

## 2016-08-19 HISTORY — DX: Thrombocytopenia, unspecified: D69.6

## 2016-08-19 HISTORY — PX: ESOPHAGOGASTRODUODENOSCOPY (EGD) WITH PROPOFOL: SHX5813

## 2016-08-19 HISTORY — DX: Other chronic pancreatitis: K86.1

## 2016-08-19 HISTORY — DX: Fatty (change of) liver, not elsewhere classified: K76.0

## 2016-08-19 HISTORY — DX: Abscess of liver: K75.0

## 2016-08-19 LAB — COMPREHENSIVE METABOLIC PANEL
ALT: 20 U/L (ref 17–63)
AST: 29 U/L (ref 15–41)
Albumin: 3 g/dL — ABNORMAL LOW (ref 3.5–5.0)
Alkaline Phosphatase: 87 U/L (ref 38–126)
Anion gap: 6 (ref 5–15)
BUN: 27 mg/dL — ABNORMAL HIGH (ref 6–20)
CHLORIDE: 109 mmol/L (ref 101–111)
CO2: 23 mmol/L (ref 22–32)
CREATININE: 0.76 mg/dL (ref 0.61–1.24)
Calcium: 7.9 mg/dL — ABNORMAL LOW (ref 8.9–10.3)
GFR calc non Af Amer: >60 mL/min (ref >60)
Glucose, Bld: 119 mg/dL — ABNORMAL HIGH (ref 65–99)
POTASSIUM: 4.6 mmol/L (ref 3.5–5.1)
SODIUM: 138 mmol/L (ref 135–145)
Total Bilirubin: 1.3 mg/dL — ABNORMAL HIGH (ref 0.3–1.2)
Total Protein: 6 g/dL — ABNORMAL LOW (ref 6.5–8.1)

## 2016-08-19 LAB — IRON AND TIBC
Iron: 65 ug/dL (ref 45–182)
Saturation Ratios: 22 % (ref 17.9–39.5)
TIBC: 301 ug/dL (ref 250–450)
UIBC: 236 ug/dL

## 2016-08-19 LAB — RETICULOCYTES
RBC.: 3.77 MIL/uL — AB (ref 4.22–5.81)
Retic Count, Absolute: 86.7 10*3/uL (ref 19.0–186.0)
Retic Ct Pct: 2.3 % (ref 0.4–3.1)

## 2016-08-19 LAB — CBC
HEMATOCRIT: 31.2 % — AB (ref 39.0–52.0)
HEMOGLOBIN: 9.4 g/dL — AB (ref 13.0–17.0)
MCH: 25.3 pg — AB (ref 26.0–34.0)
MCHC: 30.1 g/dL (ref 30.0–36.0)
MCV: 83.9 fL (ref 78.0–100.0)
Platelets: 43 10*3/uL — ABNORMAL LOW (ref 150–400)
RBC: 3.72 MIL/uL — AB (ref 4.22–5.81)
RDW: 18.7 % — ABNORMAL HIGH (ref 11.5–15.5)
WBC: 3.9 10*3/uL — AB (ref 4.0–10.5)

## 2016-08-19 LAB — URINALYSIS, ROUTINE W REFLEX MICROSCOPIC
Bilirubin Urine: NEGATIVE
GLUCOSE, UA: NEGATIVE mg/dL
HGB URINE DIPSTICK: NEGATIVE
Ketones, ur: 5 mg/dL — AB
LEUKOCYTES UA: NEGATIVE
Nitrite: NEGATIVE
PH: 7 (ref 5.0–8.0)
Protein, ur: NEGATIVE mg/dL
SPECIFIC GRAVITY, URINE: 1.019 (ref 1.005–1.030)

## 2016-08-19 LAB — POC OCCULT BLOOD, ED: FECAL OCCULT BLD: POSITIVE — AB

## 2016-08-19 LAB — VITAMIN B12: Vitamin B-12: 1457 pg/mL — ABNORMAL HIGH (ref 180–914)

## 2016-08-19 LAB — FERRITIN: Ferritin: 19 ng/mL — ABNORMAL LOW (ref 24–336)

## 2016-08-19 LAB — HEMOGLOBIN AND HEMATOCRIT, BLOOD
HCT: 28.2 % — ABNORMAL LOW (ref 39.0–52.0)
HEMATOCRIT: 29.8 % — AB (ref 39.0–52.0)
HEMOGLOBIN: 8.9 g/dL — AB (ref 13.0–17.0)
HEMOGLOBIN: 8.5 g/dL — AB (ref 13.0–17.0)

## 2016-08-19 LAB — PROTIME-INR
INR: 1.29
Prothrombin Time: 16.1 seconds — ABNORMAL HIGH (ref 11.4–15.2)

## 2016-08-19 LAB — FOLATE: FOLATE: 18.1 ng/mL (ref >5.9)

## 2016-08-19 LAB — LIPASE, BLOOD: LIPASE: 14 U/L (ref 11–51)

## 2016-08-19 LAB — PREPARE RBC (CROSSMATCH)

## 2016-08-19 LAB — MRSA PCR SCREENING: MRSA BY PCR: NEGATIVE

## 2016-08-19 SURGERY — ESOPHAGOGASTRODUODENOSCOPY (EGD) WITH PROPOFOL
Anesthesia: General

## 2016-08-19 MED ORDER — PANTOPRAZOLE SODIUM 40 MG IV SOLR
40.0000 mg | Freq: Once | INTRAVENOUS | Status: AC
  Administered 2016-08-19: 40 mg via INTRAVENOUS
  Filled 2016-08-19: qty 40

## 2016-08-19 MED ORDER — HYDROMORPHONE HCL 1 MG/ML IJ SOLN
1.0000 mg | INTRAMUSCULAR | Status: DC | PRN
  Administered 2016-08-19 – 2016-08-21 (×9): 1 mg via INTRAVENOUS
  Filled 2016-08-19 (×9): qty 1

## 2016-08-19 MED ORDER — FENTANYL CITRATE (PF) 100 MCG/2ML IJ SOLN
INTRAMUSCULAR | Status: DC | PRN
  Administered 2016-08-19: 100 ug via INTRAVENOUS

## 2016-08-19 MED ORDER — PHENYLEPHRINE 40 MCG/ML (10ML) SYRINGE FOR IV PUSH (FOR BLOOD PRESSURE SUPPORT)
PREFILLED_SYRINGE | INTRAVENOUS | Status: DC | PRN
  Administered 2016-08-19 (×2): 80 ug via INTRAVENOUS

## 2016-08-19 MED ORDER — LIDOCAINE 2% (20 MG/ML) 5 ML SYRINGE
INTRAMUSCULAR | Status: AC
  Filled 2016-08-19: qty 5

## 2016-08-19 MED ORDER — MIDAZOLAM HCL 5 MG/5ML IJ SOLN
INTRAMUSCULAR | Status: DC | PRN
  Administered 2016-08-19: 2 mg via INTRAVENOUS

## 2016-08-19 MED ORDER — LACTATED RINGERS IV SOLN: INTRAVENOUS | Status: DC

## 2016-08-19 MED ORDER — HYDROMORPHONE HCL 1 MG/ML IJ SOLN
1.0000 mg | Freq: Once | INTRAMUSCULAR | Status: AC
  Administered 2016-08-19: 1 mg via INTRAVENOUS
  Filled 2016-08-19: qty 1

## 2016-08-19 MED ORDER — LACTATED RINGERS IV SOLN
INTRAVENOUS | Status: DC | PRN
  Administered 2016-08-19: 15:00:00 via INTRAVENOUS

## 2016-08-19 MED ORDER — ONDANSETRON HCL 4 MG/2ML IJ SOLN
INTRAMUSCULAR | Status: DC | PRN
  Administered 2016-08-19: 4 mg via INTRAVENOUS

## 2016-08-19 MED ORDER — SODIUM CHLORIDE 0.9 % IV SOLN
8.0000 mg/h | INTRAVENOUS | Status: DC
  Administered 2016-08-19 – 2016-08-21 (×5): 8 mg/h via INTRAVENOUS
  Filled 2016-08-19 (×9): qty 80

## 2016-08-19 MED ORDER — MIDAZOLAM HCL 2 MG/2ML IJ SOLN
INTRAMUSCULAR | Status: AC
  Filled 2016-08-19: qty 2

## 2016-08-19 MED ORDER — FENTANYL CITRATE (PF) 100 MCG/2ML IJ SOLN
INTRAMUSCULAR | Status: AC
  Filled 2016-08-19: qty 2

## 2016-08-19 MED ORDER — ONDANSETRON HCL 4 MG/2ML IJ SOLN
INTRAMUSCULAR | Status: AC
  Filled 2016-08-19: qty 2

## 2016-08-19 MED ORDER — EPHEDRINE 5 MG/ML INJ
INTRAVENOUS | Status: AC
  Filled 2016-08-19: qty 10

## 2016-08-19 MED ORDER — POLYETHYLENE GLYCOL 3350 17 G PO PACK: 17.0000 g | PACK | Freq: Every day | ORAL | Status: DC | PRN

## 2016-08-19 MED ORDER — SODIUM CHLORIDE 0.9 % IV SOLN
Freq: Once | INTRAVENOUS | Status: AC
  Administered 2016-08-19: 13:00:00 via INTRAVENOUS

## 2016-08-19 MED ORDER — EPHEDRINE SULFATE-NACL 50-0.9 MG/10ML-% IV SOSY
PREFILLED_SYRINGE | INTRAVENOUS | Status: DC | PRN
  Administered 2016-08-19 (×2): 10 mg via INTRAVENOUS

## 2016-08-19 MED ORDER — PHENYLEPHRINE 40 MCG/ML (10ML) SYRINGE FOR IV PUSH (FOR BLOOD PRESSURE SUPPORT)
PREFILLED_SYRINGE | INTRAVENOUS | Status: AC
  Filled 2016-08-19: qty 10

## 2016-08-19 MED ORDER — SODIUM CHLORIDE 0.9 % IV BOLUS (SEPSIS)
1000.0000 mL | Freq: Once | INTRAVENOUS | Status: AC
  Administered 2016-08-19: 1000 mL via INTRAVENOUS

## 2016-08-19 MED ORDER — ONDANSETRON HCL 4 MG/2ML IJ SOLN
4.0000 mg | Freq: Once | INTRAMUSCULAR | Status: AC | PRN
  Administered 2016-08-19: 4 mg via INTRAVENOUS
  Filled 2016-08-19: qty 2

## 2016-08-19 MED ORDER — OCTREOTIDE LOAD VIA INFUSION
50.0000 ug | Freq: Once | INTRAVENOUS | Status: AC
  Administered 2016-08-19: 50 ug via INTRAVENOUS
  Filled 2016-08-19: qty 25

## 2016-08-19 MED ORDER — LIDOCAINE 2% (20 MG/ML) 5 ML SYRINGE
INTRAMUSCULAR | Status: DC | PRN
  Administered 2016-08-19: 60 mg via INTRAVENOUS

## 2016-08-19 MED ORDER — PANTOPRAZOLE SODIUM 40 MG IV SOLR: 40.0000 mg | Freq: Two times a day (BID) | INTRAVENOUS | Status: DC

## 2016-08-19 MED ORDER — ALBUTEROL SULFATE (2.5 MG/3ML) 0.083% IN NEBU: 2.5000 mg | INHALATION_SOLUTION | RESPIRATORY_TRACT | Status: DC | PRN

## 2016-08-19 MED ORDER — IOPAMIDOL (ISOVUE-300) INJECTION 61%: 100.0000 mL | Freq: Once | INTRAVENOUS | Status: DC | PRN

## 2016-08-19 MED ORDER — ONDANSETRON HCL 4 MG/2ML IJ SOLN: 4.0000 mg | Freq: Four times a day (QID) | INTRAMUSCULAR | Status: DC | PRN

## 2016-08-19 MED ORDER — DEXTROSE 5 % IV SOLN: 1.0000 g | Freq: Once | INTRAVENOUS | Status: DC

## 2016-08-19 MED ORDER — IOPAMIDOL (ISOVUE-300) INJECTION 61%
INTRAVENOUS | Status: AC
  Filled 2016-08-19: qty 100

## 2016-08-19 MED ORDER — SUCCINYLCHOLINE CHLORIDE 200 MG/10ML IV SOSY
PREFILLED_SYRINGE | INTRAVENOUS | Status: DC | PRN
  Administered 2016-08-19: 120 mg via INTRAVENOUS

## 2016-08-19 MED ORDER — DEXTROSE 5 % IV SOLN
2.0000 g | INTRAVENOUS | Status: DC
  Administered 2016-08-19 – 2016-08-20 (×2): 2 g via INTRAVENOUS
  Filled 2016-08-19 (×4): qty 2

## 2016-08-19 MED ORDER — PROPOFOL 10 MG/ML IV BOLUS
INTRAVENOUS | Status: AC
  Filled 2016-08-19: qty 20

## 2016-08-19 MED ORDER — SODIUM CHLORIDE 0.9% FLUSH
3.0000 mL | Freq: Two times a day (BID) | INTRAVENOUS | Status: DC
  Administered 2016-08-19 – 2016-08-20 (×3): 3 mL via INTRAVENOUS

## 2016-08-19 MED ORDER — ONDANSETRON HCL 4 MG PO TABS: 4.0000 mg | ORAL_TABLET | Freq: Four times a day (QID) | ORAL | Status: DC | PRN

## 2016-08-19 MED ORDER — SUCCINYLCHOLINE CHLORIDE 200 MG/10ML IV SOSY
PREFILLED_SYRINGE | INTRAVENOUS | Status: AC
  Filled 2016-08-19: qty 10

## 2016-08-19 MED ORDER — PROPOFOL 10 MG/ML IV BOLUS
INTRAVENOUS | Status: DC | PRN
  Administered 2016-08-19: 140 mg via INTRAVENOUS

## 2016-08-19 MED ORDER — SODIUM CHLORIDE 0.9 % IV SOLN
50.0000 ug/h | INTRAVENOUS | Status: DC
  Administered 2016-08-19 – 2016-08-21 (×5): 50 ug/h via INTRAVENOUS
  Filled 2016-08-19 (×10): qty 1

## 2016-08-19 MED ORDER — FENTANYL CITRATE (PF) 100 MCG/2ML IJ SOLN
50.0000 ug | Freq: Once | INTRAMUSCULAR | Status: AC
  Administered 2016-08-19: 50 ug via INTRAVENOUS
  Filled 2016-08-19: qty 2

## 2016-08-19 MED ORDER — DEXTROSE 5 % IV SOLN: 2.0000 g | Freq: Once | INTRAVENOUS | Status: DC

## 2016-08-19 SURGICAL SUPPLY — 14 items

## 2016-08-19 NOTE — Interval H&P Note (Signed)
History and Physical Interval Note:  08/19/2016 2:02 PM  Lawrence Brock  has presented today for surgery, with the diagnosis of GI bleed, Varices  The various methods of treatment have been discussed with the patient and family. After consideration of risks, benefits and other options for treatment, the patient has consented to  Procedure(s): ESOPHAGOGASTRODUODENOSCOPY (EGD) WITH PROPOFOL (N/A) as a surgical intervention .  The patient's history has been reviewed, patient examined, no change in status, stable for surgery.  I have reviewed the patient's chart and labs.  Questions were answered to the patient's satisfaction.     Lawrence Brock

## 2016-08-19 NOTE — H&P (Addendum)
TRH H&P   Patient Demographics:    Lawrence Brock, is a 46 y.o. male  MRN: [id number]   DOB - 02/14/1971  Admit Date - 08/19/2016  Outpatient Primary MD for the patient is [name], MD  Outpatient Specialists: Dr [name]    Patient coming from: Home  Chief Complaint  Patient presents with  . Abdominal Pain  . Blood In Stools      HPI:    Lawrence Brock  is a 46 y.o. male, With history of alcoholic cirrhosis and thrombocytopenia who quit drinking one year ago, currently smoking counseled to quit, reported duodenal ulcer in the chart but patient does not recall, history of pancreatitis in the past, hypertension who comes to the hospital with 1 day history of abdominal cramping and pain which she describes in the epigastric area, he then started having black tarry stools for the last 12 hours at least 3-4 of them, came to the ER where GI was consulted and I was called to admit. His lipase interestingly was negative. CT scan abdomen pelvis is pending.  In the ER he had evidence of some anemia, Hemoccult positive stool and I was called. Besides mild epigastric abdominal pain which is nonradiating and he is symptom free.    Review of systems:    In addition to the HPI above,   No Fever-chills, No Headache, No changes with Vision or hearing, No problems swallowing food or Liquids, No Chest pain, Cough or Shortness of Breath, GI symptoms as above, No Blood in stool or Urine but black stools, No dysuria, No new skin rashes or bruises, No new joints pains-aches,  No new weakness, tingling, numbness in any extremity, No recent weight gain or loss, No polyuria, polydypsia or polyphagia, No significant  Mental Stressors.  A full 10 point Review of Systems was done, except as stated above, all other Review of Systems were negative.   With Past History of the following :    Past Medical History:  Diagnosis Date  . Abdominal pain    intermittent abdominal pain in lower abdomen  . Anemia   . Chronic back pain   . Chronic pancreatitis ([place]) 09/10/2013  . Hypertension    No meds yet.  . Pancreatitis   . Platelets decreased ([place])    to continue to be evaluated and  followed by PCP  . Pneumonia    10-30-14 [place] admission, no problems now.      Past Surgical History:  Procedure Laterality Date  . APPENDECTOMY    . COLONOSCOPY WITH PROPOFOL N/A 01/05/2015   Procedure: COLONOSCOPY WITH PROPOFOL;  Surgeon: Milus Banister, MD;  [facility];  Service: Endoscopy;  Laterality: N/A;  . ENDOSCOPIC RETROGRADE CHOLANGIOPANCREATOGRAPHY (ERCP) WITH PROPOFOL N/A 06/02/2014   Procedure: ENDOSCOPIC RETROGRADE CHOLANGIOPANCREATOGRAPHY (ERCP) WITH PROPOFOL;  Surgeon: Milus Banister, MD;  [facility];  Service: Endoscopy;  Laterality: N/A;  . ERCP N/A 02/22/2014   Procedure: ENDOSCOPIC RETROGRADE CHOLANGIOPANCREATOGRAPHY (ERCP);  Surgeon: Ladene Artist, MD;  Location: [facility];  Service: Endoscopy;  Laterality: N/A;  . ERCP N/A 02/18/2014   Procedure: ENDOSCOPIC RETROGRADE CHOLANGIOPANCREATOGRAPHY (ERCP);  Surgeon: Inda Castle, MD;  [facility];  Service: Gastroenterology;  Laterality: N/A;  . ESOPHAGOGASTRODUODENOSCOPY (EGD) WITH PROPOFOL N/A 09/14/2013   Procedure: ESOPHAGOGASTRODUODENOSCOPY (EGD) WITH PROPOFOL;  Surgeon: Jerene Bears, MD;  [facility];  Service: Gastroenterology;  Laterality: N/A;  . ESOPHAGOGASTRODUODENOSCOPY (EGD) WITH PROPOFOL N/A 01/05/2015   Procedure: ESOPHAGOGASTRODUODENOSCOPY (EGD) WITH PROPOFOL;  Surgeon: Milus Banister, MD;  [facility];  Service: Endoscopy;  Laterality: N/A;  . GASTROINTESTINAL STENT REMOVAL  N/A 09/14/2013   Procedure: GASTROINTESTINAL STENT REMOVAL;  Surgeon: Jerene Bears, MD;  [facility];  Service: Gastroenterology;  Laterality: N/A;  pancreatic stent removal      Social History:     Social History  Substance Use Topics  . Smoking status: Current Every Day Smoker    Packs/day: 0.50    Types: Cigarettes  . Smokeless tobacco: Never Used  . Alcohol use No     Comment: former ETOH abuse, quit a month ago per patient         Family History :     Family History  Problem Relation Age of Onset  . Hypertension Mother        Home Medications:   Prior to Admission medications   Medication Sig Start Date End Date Taking? Authorizing Provider  Multiple Vitamin (MULTIVITAMIN) tablet Take 1 tablet by mouth daily.   Yes [provider]  oxycodone (OXY-IR) 5 MG capsule Take 1 capsule (5 mg total) by mouth every 4 (four) hours as needed. 12/01/81  Yes Delora Fuel, MD  pantoprazole (PROTONIX) 40 MG tablet Take 40 mg by mouth daily.  06/08/16  Yes [provider]  ondansetron (ZOFRAN ODT) 8 MG disintegrating tablet Take 1 tablet (8 mg total) by mouth every 8 (eight) hours as needed for nausea or vomiting. Patient not taking: Reported on 08/19/2016 07/02/16   Dorie Rank, MD  sucralfate (CARAFATE) 1 g tablet Take 1 tablet (1 g total) by mouth 4 (four) times daily -  with meals and at bedtime. Patient not taking: Reported on 08/19/2016 07/02/16   Dorie Rank, MD     Allergies:    No Known Allergies   Physical Exam:   Vitals  Blood pressure 100/73, pulse 64, temperature 97.6 F (36.4 C), temperature source Oral, resp. rate 17, height 5\' 8"  (1.727 m), weight 68 kg (150 lb), SpO2 100 %.   1. General Thin middle-aged Asian male lying in bed in NAD,    2. Normal affect and insight, Not Suicidal or Homicidal, Awake Alert, Oriented X 3.  3. No F.N deficits, ALL C.Nerves Intact, Strength 5/5 all 4 extremities, Sensation intact all 4 extremities,  Plantars down going.  4. Ears and Eyes appear Normal, Conjunctivae clear, PERRLA. Moist Oral Mucosa.  5. Supple Neck, No JVD, No cervical lymphadenopathy appriciated, No Carotid Bruits.  6. Symmetrical Chest wall movement, Good air movement bilaterally, CTAB.  7. RRR, No Gallops, Rubs or Murmurs, No Parasternal Heave.  8. Positive Bowel Sounds, Abdomen Soft, mild epigastric tenderness, No organomegaly appriciated,No rebound -guarding or rigidity.  9.  No Cyanosis, Normal Skin Turgor, No Skin Rash or Bruise.  10. Good muscle tone,  joints appear normal , no effusions, Normal ROM.  11. No Palpable Lymph Nodes in Neck or Axillae      Data Review:    CBC  Recent Labs Lab 08/19/16 0830  WBC 3.9*  HGB 9.4*  HCT 31.2*  PLT 43*  MCV 83.9  MCH 25.3*  MCHC 30.1  RDW 18.7*   ------------------------------------------------------------------------------------------------------------------  Chemistries   Recent Labs Lab 08/19/16 0830  NA 138  K 4.6  CL 109  CO2 23  GLUCOSE 119*  BUN 27*  CREATININE 0.76  CALCIUM 7.9*  AST 29  ALT 20  ALKPHOS 87  BILITOT 1.3*   ------------------------------------------------------------------------------------------------------------------ estimated creatinine clearance is 112.2 mL/min (by C-G formula based on SCr of 0.76 mg/dL). ------------------------------------------------------------------------------------------------------------------ No results for input(s): TSH, T4TOTAL, T3FREE, THYROIDAB in the last 72 hours.  Invalid input(s): FREET3  Coagulation profile  Recent Labs Lab 08/19/16 0934  INR 1.29   ------------------------------------------------------------------------------------------------------------------- No results for input(s): DDIMER in the last 72 hours. -------------------------------------------------------------------------------------------------------------------  Cardiac Enzymes No results for  input(s): CKMB, TROPONINI, MYOGLOBIN in the last 168 hours.  Invalid input(s): CK ------------------------------------------------------------------------------------------------------------------    Component Value Date/Time   BNP 11.5 10/30/2014 2050     ---------------------------------------------------------------------------------------------------------------  Urinalysis    Component Value Date/Time   COLORURINE YELLOW 08/19/2016 0818   APPEARANCEUR CLEAR 08/19/2016 0818   LABSPEC 1.019 08/19/2016 0818   PHURINE 7.0 08/19/2016 0818   GLUCOSEU NEGATIVE 08/19/2016 0818   HGBUR NEGATIVE 08/19/2016 0818   BILIRUBINUR NEGATIVE 08/19/2016 0818   KETONESUR 5 (A) 08/19/2016 0818   PROTEINUR NEGATIVE 08/19/2016 0818   UROBILINOGEN 0.2 02/25/2014 1320   NITRITE NEGATIVE 08/19/2016 0818   LEUKOCYTESUR NEGATIVE 08/19/2016 0818    ----------------------------------------------------------------------------------------------------------------   Imaging Results:    No results found.      Assessment & Plan:      1. Upper GI bleed with black tarry stools and elevated BUN. Could be variceal bleed however duodenal ulcer cannot be ruled out, will be placed on IV PPI and octreotide drip, Rocephin,  will monitor H&H closely, type screen has been done: Since blood pressure is borderline and hemoglobin has dropped from the last hemoglobin of 15 to close to 8 we'll transfuse 1 unit of packed RBC. GI has been consulted, nothing by mouth for now, likely will get EGD soon.  2. Alcoholic cirrhosis with lumbar cytopenia. Supportive care for now. Outpatient follow-up per Dr. [name]. He has quit drinking 1 year ago.  3. Smoking. Counseled to quit.  4. 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 Ongoing epigastric pain. All likely due to #1 above with possible duodenal ulcer however CT abdomen pelvis in the ER has been ordered, will get it done later.  5. Acute Anemia of ongoing GI blood loss. Blood  pressure borderline so we'll transfuse 1 unit of packed busy now. Monitor H&H.   DVT Prophylaxis SCDs    AM Labs Ordered, also please review Full Orders  Family Communication: Admission, patients condition and plan of care including tests being ordered have been  discussed with the patient  who indicates understanding and agree with the plan and Code Status.  Code Status Full  Likely DC to  Home   Condition GUARDED    Consults called: GI    Admission status: Inpt    Time spent in minutes : 35   Lala Lund M.D on 08/19/2016 at 10:58 AM  Between 7am to 7pm - Pager - [phone] ( page via [facility], text pages only, please mention full 10 digit call back number).  After 7pm go to [email or web address] - password [facility]  [facility] - Office  [phone]

## 2016-08-19 NOTE — Transfer of Care (Signed)
Immediate Anesthesia Transfer of Care Note  Patient: Lawrence Brock  Procedure(s) Performed: Procedure(s): ESOPHAGOGASTRODUODENOSCOPY (EGD) WITH PROPOFOL (N/A)  Patient Location: PACU  Anesthesia Type:General  Level of Consciousness:  sedated, patient cooperative and responds to stimulation  Airway & Oxygen Therapy:Patient Spontanous Breathing and Patient connected to face mask oxgen  Post-op Assessment:  Report given to PACU RN and Post -op Vital signs reviewed and stable  Post vital signs:  Reviewed and stable  Last Vitals:  Vitals:   08/19/16 1226 08/19/16 1340  BP: 98/70 100/73  Pulse: (!) 56 (!) 52  Resp: 18 10  Temp: 36.4 C 85.5 C    Complications: No apparent anesthesia complications

## 2016-08-19 NOTE — ED Notes (Signed)
Bed: [facility] Expected date:  Expected time:  Means of arrival:  Comments: 

## 2016-08-19 NOTE — Progress Notes (Addendum)
PHARMACY NOTE   Pharmacy has been asked to assist with dosing of Ceftriaxone in patient with cirrhosis, GI bleed. Dosage ordered by GI remains stable at 2g IV q24h and need for further dosage adjustment appears unlikely at present.    Will sign off at this time.  Please reconsult if a change in clinical status warrants re-evaluation of dosage.   Lindell Spar, PharmD, BCPS Pager: [phone] 08/19/2016 12:48 PM

## 2016-08-19 NOTE — ED Triage Notes (Signed)
Pt complaint of generalized abd pain with associated nausea and tarry stools onset 2200 last night; hx of pancreatitis and blood transfusion.

## 2016-08-19 NOTE — Anesthesia Procedure Notes (Signed)
Procedure Name: Intubation Date/Time: 08/19/2016 2:57 PM Performed by: Montel Clock Pre-anesthesia Checklist: Patient identified, Emergency Drugs available, Suction available, Patient being monitored and Timeout performed Patient Re-evaluated:Patient Re-evaluated prior to inductionOxygen Delivery Method: Circle system utilized Preoxygenation: Pre-oxygenation with 100% oxygen Intubation Type: IV induction, Rapid sequence and Cricoid Pressure applied Laryngoscope Size: Mac and 3 Grade View: Grade I Tube type: Oral Tube size: 7.5 mm Number of attempts: 1 Airway Equipment and Method: Stylet Placement Confirmation: ETT inserted through vocal cords under direct vision,  positive ETCO2 and breath sounds checked- equal and bilateral Secured at: 23 cm Tube secured with: Tape Dental Injury: Teeth and Oropharynx as per pre-operative assessment

## 2016-08-19 NOTE — H&P (View-Only) (Signed)
Consultation  Referring Provider: No ref. provider found Primary Care Physician:  [name], MD Primary Gastroenterologist:  Dr. [name]  Reason for Consultation:   Melena, abdominal  pain  HPI: Lawrence Brock is a 46 y.o. male known to Dr. [name], but not seen by GI since 2016. Patient presented to the emergency room early this morning after onset of black tarry stool about 10:00 last evening. He says he began having some abdominal pain in the middle of the night as well which she describes as sharp and stabbing and rather generalized. He had 3 episodes total of black tarry stool prior to coming to the emergency room. He has not had any further bowel movements since arrival. He does complain of some nausea but has not vomited. No fever or chills. His abdominal pain has persisted. He says this feels different than his previous pancreatitis. 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 He will be in a remote area. Patient has been hemodynamically stable since arrival, systolic blood pressure in the 90s. Patient has previous history of EtOH abuse, none in the past 18 months. No regular aspirin or NSAID use. Baseline hemoglobin from April 2018 was 15, platelets 41 Labs today show hemoglobin 9.4 hematocrit of 31,platelets 43, INR 1.21/pro time 16.1  Patient had CT of the abdomen and pelvis with an ER visit in April 2018 that showed a fatty liver, prominent esophageal varices, chronic portal vein thrombosis with cavernous transformation and significant splenomegaly. 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 He was started on nadolol at that time.  Colonoscopy October 2016 3 semi-pedunculated polyps were removed from the left colon also noted internal and sternal hemorrhoids. Polyps were adenomatous and he was  advised to have 3 year interval follow-up.   Past Medical History:  Diagnosis Date  . Abdominal pain    intermittent abdominal pain in lower abdomen  . Anemia   . Chronic back pain   . Chronic pancreatitis ([place]) 09/10/2013  . Hypertension    No meds yet.  . Pancreatitis   . Platelets decreased ([place])    to continue to be evaluated and followed by PCP  . Pneumonia    10-30-14 [place] admission, no problems now.    Past Surgical History:  Procedure Laterality Date  . APPENDECTOMY    . COLONOSCOPY WITH PROPOFOL N/A 01/05/2015   Procedure: COLONOSCOPY WITH PROPOFOL;  Surgeon: Milus Banister, MD;  [facility];  Service: Endoscopy;  Laterality: N/A;  . ENDOSCOPIC RETROGRADE CHOLANGIOPANCREATOGRAPHY (ERCP) WITH PROPOFOL N/A 06/02/2014   Procedure: ENDOSCOPIC RETROGRADE CHOLANGIOPANCREATOGRAPHY (ERCP) WITH PROPOFOL;  Surgeon: Milus Banister, MD;  [facility];  Service: Endoscopy;  Laterality: N/A;  . ERCP N/A 02/22/2014   Procedure: ENDOSCOPIC RETROGRADE CHOLANGIOPANCREATOGRAPHY (ERCP);  Surgeon: Ladene Artist, MD;  Location: [facility];  Service: Endoscopy;  Laterality: N/A;  . ERCP N/A 02/18/2014   Procedure: ENDOSCOPIC RETROGRADE CHOLANGIOPANCREATOGRAPHY (ERCP);  Surgeon: Inda Castle, MD;  [facility];  Service: Gastroenterology;  Laterality: N/A;  . ESOPHAGOGASTRODUODENOSCOPY (EGD) WITH PROPOFOL N/A 09/14/2013   Procedure: ESOPHAGOGASTRODUODENOSCOPY (EGD) WITH PROPOFOL;  Surgeon: Jerene Bears, MD;  [facility];  Service: Gastroenterology;  Laterality: N/A;  . ESOPHAGOGASTRODUODENOSCOPY (EGD) WITH PROPOFOL N/A 01/05/2015   Procedure: ESOPHAGOGASTRODUODENOSCOPY (EGD) WITH PROPOFOL;  Surgeon: Melene Plan  [name], MD;  Location: [facility];  Service: Endoscopy;  Laterality: N/A;  . GASTROINTESTINAL STENT REMOVAL N/A 09/14/2013   Procedure: GASTROINTESTINAL STENT REMOVAL;  Surgeon: Jerene Bears, MD;  [facility];  Service:  Gastroenterology;  Laterality: N/A;  pancreatic stent removal    Prior to Admission medications   Medication Sig Start Date End Date Taking? Authorizing Provider  Multiple Vitamin (MULTIVITAMIN) tablet Take 1 tablet by mouth daily.   Yes [provider]  oxycodone (OXY-IR) 5 MG capsule Take 1 capsule (5 mg total) by mouth every 4 (four) hours as needed. 61/6/07  Yes Delora Fuel, MD  pantoprazole (PROTONIX) 40 MG tablet Take 40 mg by mouth daily.  06/08/16  Yes [provider]  ondansetron (ZOFRAN ODT) 8 MG disintegrating tablet Take 1 tablet (8 mg total) by mouth every 8 (eight) hours as needed for nausea or vomiting. Patient not taking: Reported on 08/19/2016 07/02/16   Dorie Rank, MD  sucralfate (CARAFATE) 1 g tablet Take 1 tablet (1 g total) by mouth 4 (four) times daily -  with meals and at bedtime. Patient not taking: Reported on 08/19/2016 07/02/16   Dorie Rank, MD    Current Facility-Administered Medications  Medication Dose Route Frequency Provider Last Rate Last Dose  . fentaNYL (SUBLIMAZE) injection 50 mcg  50 mcg Intravenous Once Ward, Ozella Almond, PA-C      . octreotide (SANDOSTATIN) 2 mcg/mL load via infusion 50 mcg  50 mcg Intravenous Once Ward, Ozella Almond, PA-C       And  . octreotide (SANDOSTATIN) 500 mcg in sodium chloride 0.9 % 250 mL (2 mcg/mL) infusion  50 mcg/hr Intravenous Continuous Ward, Ozella Almond, PA-C      . pantoprazole (PROTONIX) 80 mg in sodium chloride 0.9 % 250 mL (0.32 mg/mL) infusion  8 mg/hr Intravenous Continuous Duffy Bruce, MD      . pantoprazole (PROTONIX) injection 40 mg  40 mg Intravenous Once Duffy Bruce, MD      . sodium chloride 0.9 % bolus 1,000 mL  1,000 mL Intravenous Once Ward, Ozella Almond, PA-C       Current Outpatient Prescriptions  Medication Sig Dispense Refill  . Multiple Vitamin (MULTIVITAMIN) tablet Take 1 tablet by mouth daily.    Marland Kitchen oxycodone (OXY-IR) 5 MG capsule Take 1 capsule (5 mg total) by mouth  every 4 (four) hours as needed. 15 capsule 0  . pantoprazole (PROTONIX) 40 MG tablet Take 40 mg by mouth daily.   8  . ondansetron (ZOFRAN ODT) 8 MG disintegrating tablet Take 1 tablet (8 mg total) by mouth every 8 (eight) hours as needed for nausea or vomiting. (Patient not taking: Reported on 08/19/2016) 20 tablet 0  . sucralfate (CARAFATE) 1 g tablet Take 1 tablet (1 g total) by mouth 4 (four) times daily -  with meals and at bedtime. (Patient not taking: Reported on 08/19/2016) 20 tablet 0    Allergies as of 08/19/2016  . (No Known Allergies)    Family History  Problem Relation Age of Onset  . Hypertension Mother     Social History   Social History  . Marital status: Married    Spouse name: N/A  . Number of children: 4  . Years of education: N/A   Occupational History  . Chemical Operator    Social History Main Topics  . Smoking status: Current Every Day Smoker    Packs/day: 0.50    Types: Cigarettes  . Smokeless tobacco: Never Used  . Alcohol  use No     Comment: former ETOH abuse, quit a month ago per patient  . Drug use: No  . Sexual activity: Yes   Other Topics Concern  . Not on file   Social History Narrative  . No narrative on file    Review of Systems: Pertinent positive and negative review of systems were noted in the above HPI section.  All other review of systems was otherwise negative.  Physical Exam: Vital signs in last 24 hours: Temp:  [97.6 F (36.4 C)] 97.6 F (36.4 C) (05/28 0721) Pulse Rate:  [64-92] 64 (05/28 1010) Resp:  [17] 17 (05/28 1010) BP: (100-105)/(73-83) 100/73 (05/28 1010) SpO2:  [100 %] 100 % (05/28 1010) Weight:  [150 lb (68 kg)] 150 lb (68 kg) (05/28 0721)   General:   Alert,  Well-developed, well-nourished, Filipino male, pleasant and cooperative in NAD-uncomfortable secondary to abdominal pain Head:  Normocephalic and atraumatic. Eyes:  Sclera clear, no icterus.   Conjunctiva pink. Ears:  Normal auditory acuity. Nose:  No  deformity, discharge,  or lesions. Mouth:  No deformity or lesions.   Neck:  Supple; no masses or thyromegaly. Lungs:  Clear throughout to auscultation.   No wheezes, crackles, or rhonchi. Heart:  Regular rate and rhythm; no murmurs, clicks, rubs,  or gallops. Abdomen:  Soft, bowel sounds are present he has some rather generalized abdominal tenderness nonfocal there is no palpable mass or hepatosplenomegaly   Rectal:  Deferred -melena documented by ER provider on admission Msk:  Symmetrical without gross deformities. . Pulses:  Normal pulses noted. Extremities:  Without clubbing or edema. Neurologic:  Alert and  oriented x4;  grossly normal neurologically. Skin:  Intact without significant lesions or rashes.. Psych:  Alert and cooperative. Normal mood and affect.  Intake/Output from previous day: No intake/output data recorded. Intake/Output this shift: No intake/output data recorded.  Lab Results:  Recent Labs  08/19/16 0830  WBC 3.9*  HGB 9.4*  HCT 31.2*  PLT 43*   BMET  Recent Labs  08/19/16 0830  NA 138  K 4.6  CL 109  CO2 23  GLUCOSE 119*  BUN 27*  CREATININE 0.76  CALCIUM 7.9*   LFT  Recent Labs  08/19/16 0830  PROT 6.0*  ALBUMIN 3.0*  AST 29  ALT 20  ALKPHOS 87  BILITOT 1.3*   PT/INR  Recent Labs  08/19/16 0934  LABPROT 16.1*  INR 1.29   Hepatitis Panel No results for input(s): HEPBSAG, HCVAB, HEPAIGM, HEPBIGM in the last 72 hours.   IMPRESSION:  #63 46 year old [place] male who presents with melena, onset last evening with 3 episodes at home prior to admission. He is also complaining of severe crampy rather generalized abdominal pain, and nausea but no vomiting. Lipase normal Patient has history of chronic portal vein thrombosis with cavernous transformation, splenomegaly and previously documented large esophageal varices and portal gastropathy. He has not been clear from previous imaging whether he has cirrhosis, versus portal  hypertension secondary to chronic portal vein thrombosis. Rule out bleeding secondary to esophageal varices, versus portal gastropathy Hemoglobin is down 5-1/2 g from baseline at the time of admission  #2 history of alcohol abuse in active 18 months #3 history of chronic calcific pancreatitis #4 severe thrombocytopenia chronic #5 history of adenomatous colon polyps October 2016 due for follow-up October 2019 #6 chronic intra-and extrahepatic biliary ductal prominence  PLAN: #1 admit to step down #2 IV Protonix infusion and IV octreotide infusion have been started #3 serial  hemoglobins Q4 hours and transfuse for hemoglobin 8 or less #4 CT of the abdomen and pelvis has been ordered and is pending #5 patient will be scheduled for EGD with Dr. Havery Moros this afternoon with anesthesia. Procedure was discussed in detail with the patient including risks and benefits and he is agreeable to proceed. #6 IV Rocephin ordered Thank you we will follow with you     Amy Esterwood  08/19/2016, 10:21 AM

## 2016-08-19 NOTE — Op Note (Signed)
[facility] Patient Name: Lawrence Brock Procedure Date: 08/19/2016 MRN: [id number] Attending MD: Carlota Raspberry. Royanne Warshaw MD, MD Date of Birth: 04/14/1970 CSN: [id number] Age: 46 Admit Type: Inpatient Procedure:                Upper GI endoscopy Indications:              Melena with 5 point drop in Hgb, history of portal                            hypertension, history of esophageal and gastric                            varices Providers:                Remo Lipps P. Marquita Lias MD, MD, Elna Breslow, RN,                            William Dalton, Technician Referring MD:              Medicines:                Monitored Anesthesia Care Complications:            No immediate complications. Estimated blood loss:                            Minimal. Estimated Blood Loss:     Estimated blood loss was minimal. Procedure:                Pre-Anesthesia Assessment:                           - Prior to the procedure, a History and Physical                            was performed, and patient medications and                            allergies were reviewed. The patient's tolerance of                            previous anesthesia was also reviewed. The risks                            and benefits of the procedure and the sedation                            options and risks were discussed with the patient.                            All questions were answered, and informed consent                            was obtained. Prior Anticoagulants: The patient has  taken no previous anticoagulant or antiplatelet                            agents. ASA Grade Assessment: III - A patient with                            severe systemic disease. After reviewing the risks                            and benefits, the patient was deemed in                            satisfactory condition to undergo the procedure.                           After obtaining informed  consent, the endoscope was                            passed under direct vision. Throughout the                            procedure, the patient's blood pressure, pulse, and                            oxygen saturations were monitored continuously. The                            [id number]) scope was introduced through the                            mouth, and advanced to the second part of duodenum.                            The upper GI endoscopy was accomplished without                            difficulty. The patient tolerated the procedure                            well. Scope In: Scope Out: Findings:      Esophagogastric landmarks were identified: the Z-line was found at 40       cm, the gastroesophageal junction was found at 40 cm and the upper       extent of the gastric folds was found at 40 cm from the incisors.      Multiple columns of very large varices were found in the upper third of       the esophagus, in the middle third of the esophagus and in the lower       third of the esophagus. A large red wale sign was present on a varix at       the GEJ which was banded, and another red whale sign on a large varix in       the mid esophagus which was banded. A total of 11 bands were  successfully placed along the varices with good result, most in the       lower and mid esophagus. No active bleeding appreciated.      Varices were found in the gastric fundus / cadia. There were no stigmata       of recent bleeding.      Diffuse mild inflammation characterized by erythema and friability was       found in the gastric body consistent with portal hypertensive gastritis.      The exam of the stomach was otherwise normal. No blood in the stomach.      The duodenal bulb and second portion of the duodenum were normal. No       blood noted. Impression:               - Esophagogastric landmarks identified.                           - Very large esophageal varices with  high risk                            stigmata as outlined. Banded x 11.                           - Gastric varices in the fundus without stigmata.                           - Portal hypertensive gastritis.                           - Normal duodenum.                           Overall, I suspect the patient had bleeding from                            esophageal varices to cause dramatic drop in Hgb                            and prior bleeding symptoms. No active bleeding at                            present. Moderate Sedation:      No moderate sedation, case performed with MAC Recommendation:           - Return patient to ICU for ongoing care.                           - NPO for now post banding                           - Continue present medications                           - Continue IV octreotide and IV protonix                           - Monitor for recurrent bleeding, serial Hgb -  transfuse to keep Hgb > 7.                           - If the patient has rebleeding, recommend platelet                            transfusion and IR consultation                           - GI service will continue to follow with you Procedure Code(s):        --- Professional ---                           [phone], Esophagogastroduodenoscopy, flexible,                            transoral; with band ligation of esophageal/gastric                            varices Diagnosis Code(s):        --- Professional ---                           I85.00, Esophageal varices without bleeding                           I86.4, Gastric varices                           K29.70, Gastritis, unspecified, without bleeding                           K92.1, Melena (includes Hematochezia) CPT copyright 2016 [employer] Association. All rights reserved. The codes documented in this report are preliminary and upon coder review may  be revised to meet current compliance requirements. Remo Lipps P.  Ketrina Boateng MD, MD 08/19/2016 3:41:30 PM This report has been signed electronically. Number of Addenda: 0

## 2016-08-19 NOTE — Anesthesia Postprocedure Evaluation (Signed)
Anesthesia Post Note  Patient: Lawrence Brock  Procedure(s) Performed: Procedure(s) (LRB): ESOPHAGOGASTRODUODENOSCOPY (EGD) WITH PROPOFOL (N/A)  Patient location during evaluation: PACU Anesthesia Type: General Level of consciousness: awake and alert Pain management: pain level controlled Vital Signs Assessment: post-procedure vital signs reviewed and stable Respiratory status: spontaneous breathing, nonlabored ventilation, respiratory function stable and patient connected to nasal cannula oxygen Cardiovascular status: blood pressure returned to baseline and stable Postop Assessment: no signs of nausea or vomiting Anesthetic complications: no       Last Vitals:  Vitals:   08/19/16 1600 08/19/16 1615  BP: 110/81 118/84  Pulse: 73 66  Resp: 12 10  Temp:      Last Pain:  Vitals:   08/19/16 1546  TempSrc:   PainSc: Asleep                 Danniell Rotundo,W. EDMOND

## 2016-08-19 NOTE — ED Provider Notes (Signed)
[facility] DEPT Provider Note   CSN: [id number] Arrival date & time: 08/19/16  5102     History   Chief Complaint Chief Complaint  Patient presents with  . Abdominal Pain  . Blood In Stools    HPI Lawrence Brock is a 46 y.o. male.  The history is provided by the patient and medical records. No language interpreter was used.  Abdominal Pain   Associated symptoms include nausea. Pertinent negatives include diarrhea and vomiting.   Lawrence Brock is a 46 y.o. male  with a PMH of chronic pancreatitis, anemia, chronic portal vein thrombosis, alcoholic cirrhosis,esophageal and gastric varices who presents to the Emergency Department complaining of acute onset of generalized abdominal pain described as cramping and making him double over in pain which began at around 10:00 last night. Patient also endorses associated nausea and dark stool which he describes as looking like "black tarry". He is on iron supplementation for anemia daily, however states that stool does not typically look dark like this. He has had a similar episode in the past, however notes today is much more severe than usual. No vomiting. He initially was not dizzy or lightheaded, but began feeling dizzy when ambulating around 3 AM this morning, prompting him to come to the emergency department. Followed by heme/onc and GI.    Past Medical History:  Diagnosis Date  . Abdominal pain    intermittent abdominal pain in lower abdomen  . Anemia   . Chronic back pain   . Chronic pancreatitis ([place]) 09/10/2013  . Hypertension    No meds yet.  . Pancreatitis   . Platelets decreased ([place])    to continue to be evaluated and followed by PCP  . Pneumonia    10-30-14 [place] admission, no problems now.    Patient Active Problem List   Diagnosis Date Noted  . Anemia, iron deficiency   . Alcoholic cirrhosis of liver without ascites ([place])   . Anemia 06/26/2015  . Bleeding gastrointestinal 12/29/2014  . Absolute anemia  12/29/2014  . Portal vein thrombosis 12/29/2014  . CAP (community acquired pneumonia) 10/31/2014  . Pancytopenia ([place]) 10/31/2014  . Pancreatitis 10/31/2014  . Cholangitis   . Biliary obstruction 02/22/2014  . 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 Acute cholangitis 02/18/2014  . Common bile duct stricture 02/18/2014  . Choledocholithiasis 02/18/2014  . Hepatic abscess 02/16/2014  . Elevated LFTs 02/15/2014  . Other specified fever 02/15/2014  . Duodenal ulcer 09/14/2013  . Fever 09/10/2013  . Chronic pancreatitis ([place]) 09/10/2013  . Hypokalemia 09/09/2013  . Thrombocytopenia, unspecified ([place]) 09/09/2013  . Normocytic anemia 09/09/2013  . Pancreatic pseudocyst 09/09/2013  . Unspecified constipation 09/09/2013  . Acute pancreatitis 09/08/2013    Past Surgical History:  Procedure Laterality Date  . APPENDECTOMY    . COLONOSCOPY WITH PROPOFOL N/A 01/05/2015   Procedure: COLONOSCOPY WITH PROPOFOL;  Surgeon: Milus Banister, MD;  Location: [facility] ENDOSCOPY;  Service: Endoscopy;  Laterality: N/A;  . ENDOSCOPIC RETROGRADE CHOLANGIOPANCREATOGRAPHY (ERCP) WITH PROPOFOL N/A 06/02/2014   Procedure: ENDOSCOPIC RETROGRADE CHOLANGIOPANCREATOGRAPHY (ERCP) WITH PROPOFOL;  Surgeon: Milus Banister, MD;  Location: [facility] ENDOSCOPY;  Service: Endoscopy;  Laterality: N/A;  . ERCP N/A 02/22/2014   Procedure: ENDOSCOPIC RETROGRADE CHOLANGIOPANCREATOGRAPHY (ERCP);  Surgeon: Ladene Artist, MD;  Location: [facility];  Service: Endoscopy;  Laterality: N/A;  . ERCP N/A 02/18/2014   Procedure: ENDOSCOPIC RETROGRADE CHOLANGIOPANCREATOGRAPHY (ERCP);  Surgeon: Inda Castle, MD;  Location: [facility] ORS;  Service: Gastroenterology;  Laterality: N/A;  .  ESOPHAGOGASTRODUODENOSCOPY (EGD) WITH PROPOFOL N/A 09/14/2013   Procedure: ESOPHAGOGASTRODUODENOSCOPY (EGD) WITH PROPOFOL;  Surgeon: Jerene Bears, MD;  Location: [facility] END[facility];  Service: Gastroenterology;  Laterality: N/A;  . ESOPHAGOGASTRODUODENOSCOPY (EGD) WITH  PROPOFOL N/A 01/05/2015   Procedure: ESOPHAGOGASTRODUODENOSCOPY (EGD) WITH PROPOFOL;  Surgeon: Milus Banister, MD;  Location: [facility] END[facility];  Service: Endoscopy;  Laterality: N/A;  . GASTROINTESTINAL STENT REMOVAL N/A 09/14/2013   Procedure: GASTROINTESTINAL STENT REMOVAL;  Surgeon: Jerene Bears, MD;  Location: [facility] ENDOS[facility];  Service: Gastroenterology;  Laterality: N/A;  pancreatic stent removal       Home Medications    Prior to Admission medications   Medication Sig Start Date End Date Taking? Authorizing Provider  Multiple Vitamin (MULTIVITAMIN) tablet Take 1 tablet by mouth daily.   Yes [provider]  oxycodone (OXY-IR) 5 MG capsule Take 1 capsule (5 mg total) by mouth every 4 (four) hours as needed. 85/0/27  Yes Delora Fuel, MD  pantoprazole (PROTONIX) 40 MG tablet Take 40 mg by mouth daily.  06/08/16  Yes [provider]  ondansetron (ZOFRAN ODT) 8 MG disintegrating tablet Take 1 tablet (8 mg total) by mouth every 8 (eight) hours as needed for nausea or vomiting. Patient not taking: Reported on 08/19/2016 07/02/16   Dorie Rank, MD  sucralfate (CARAFATE) 1 g tablet Take 1 tablet (1 g total) by mouth 4 (four) times daily -  with meals and at bedtime. Patient not taking: Reported on 08/19/2016 07/02/16   Dorie Rank, MD    Family History Family History  Problem Relation Age of Onset  . Hypertension Mother     Social History Social History  Substance Use Topics  . Smoking status: Current Every Day Smoker    Packs/day: 0.50    Types: Cigarettes  . Smokeless tobacco: Never Used  . Alcohol use No     Comment: former ETOH abuse, quit a month ago per patient     Allergies   Patient has no known allergies.   Review of Systems Review of Systems  Gastrointestinal: Positive for abdominal pain, blood in stool and nausea. Negative for diarrhea and vomiting.  All other systems reviewed and are negative.    Physical Exam Updated Vital Signs BP 100/73 (BP  Location: Left Arm)   Pulse 64   Temp 97.6 F (36.4 C) (Oral)   Resp 17   Ht 5\' 8"  (1.727 m)   Wt 68 kg (150 lb)   SpO2 100%   BMI 22.81 kg/m   Physical Exam  Constitutional: He is oriented to person, place, and time. He appears well-developed and well-nourished. No distress.  HENT:  Head: Normocephalic and atraumatic.  Cardiovascular: Normal rate, regular rhythm and normal heart sounds.   No murmur heard. Pulmonary/Chest: Effort normal and breath sounds normal. No respiratory distress.  Abdominal: Soft. He exhibits no distension. There is no tenderness.  Genitourinary:  Genitourinary Comments: Dark, tarry stool on rectal exam. No hemorrhoids.   Musculoskeletal: He exhibits no edema.  Neurological: He is alert and oriented to person, place, and time.  Skin: Skin is warm and dry.  Nursing note and vitals reviewed.    ED Treatments / Results  Labs (all labs ordered are listed, but only abnormal results are displayed) Labs Reviewed  COMPREHENSIVE METABOLIC PANEL - Abnormal; Notable for the following:       Result Value   Glucose, Bld 119 (*)    BUN 27 (*)    Calcium 7.9 (*)    Total Protein 6.0 (*)  Albumin 3.0 (*)    Total Bilirubin 1.3 (*)    All other components within normal limits  CBC - Abnormal; Notable for the following:    WBC 3.9 (*)    RBC 3.72 (*)    Hemoglobin 9.4 (*)    HCT 31.2 (*)    MCH 25.3 (*)    RDW 18.7 (*)    Platelets 43 (*)    All other components within normal limits  URINALYSIS, ROUTINE W REFLEX MICROSCOPIC - Abnormal; Notable for the following:    Ketones, ur 5 (*)    All other components within normal limits  RETICULOCYTES - Abnormal; Notable for the following:    RBC. 3.77 (*)    All other components within normal limits  PROTIME-INR - Abnormal; Notable for the following:    Prothrombin Time 16.1 (*)    All other components within normal limits  POC OCCULT BLOOD, ED - Abnormal; Notable for the following:    Fecal Occult Bld  POSITIVE (*)    All other components within normal limits  LIPASE, BLOOD  VITAMIN B12  FOLATE  IRON AND TIBC  FERRITIN  HEMOGLOBIN AND HEMATOCRIT, BLOOD  HEMOGLOBIN AND HEMATOCRIT, BLOOD  TYPE AND SCREEN  PREPARE RBC (CROSSMATCH)    EKG  EKG Interpretation None       Radiology No results found.  Procedures Procedures (including critical care time)  CRITICAL CARE Performed by: Ozella Almond Myleka Moncure   Total critical care time: 35 minutes  Critical care time was exclusive of separately billable procedures and treating other patients.  Critical care was necessary to treat or prevent imminent or life-threatening deterioration.  Critical care was time spent personally by me on the following activities: development of treatment plan with patient and/or surrogate as well as nursing, discussions with consultants, evaluation of patient's response to treatment, examination of patient, obtaining history from patient or surrogate, ordering and performing treatments and interventions, ordering and review of laboratory studies, ordering and review of radiographic studies, pulse oximetry and re-evaluation of patient's condition.  Medications Ordered in ED Medications  pantoprazole (PROTONIX) 80 mg in sodium chloride 0.9 % 250 mL (0.32 mg/mL) infusion (not administered)  pantoprazole (PROTONIX) injection 40 mg (not administered)  fentaNYL (SUBLIMAZE) injection 50 mcg (not administered)  octreotide (SANDOSTATIN) 2 mcg/mL load via infusion 50 mcg (not administered)    And  octreotide (SANDOSTATIN) 500 mcg in sodium chloride 0.9 % 250 mL (2 mcg/mL) infusion (not administered)  sodium chloride 0.9 % bolus 1,000 mL (not administered)  ondansetron (ZOFRAN) injection 4 mg (4 mg Intravenous Given 08/19/16 0814)  HYDROmorphone (DILAUDID) injection 1 mg (1 mg Intravenous Given 08/19/16 0814)  sodium chloride 0.9 % bolus 1,000 mL (1,000 mLs Intravenous New Bag/Given 08/19/16 0913)  pantoprazole  (PROTONIX) injection 40 mg (40 mg Intravenous Given 08/19/16 0912)     Initial Impression / Assessment and Plan / ED Course  I have reviewed the triage vital signs and the nursing notes.  Pertinent labs & imaging results that were available during my care of the patient were reviewed by me and considered in my medical decision making (see chart for details).    Lawrence Brock is a 46 y.o. male who presents to ED for dark tarry stool. History of alcohol cirrhosis with known gastric and esophageal varices. On rectal exam, stool is black. Hemoccult positive. Hemoglobin of 9.4, but it was 15.2 back in April 17 very significant drop. Blood pressure upon arrival 105/83 with heart rate in the 90s.  Fluids given. Patient also given Protonix and Rocephin. BUN 27, creatinine wdl. Patient has history of thrombocytopenia. Platelets of 43 which seemed to be about baseline for him. Consulted GI, Dr. Havery Moros, who recommends starting him on Octreotide and GI will evaluate. Hospitalist consulted who will admit.   Patient discussed with Dr. Ellender Hose who agrees with treatment plan.    Final Clinical Impressions(s) / ED Diagnoses   Final diagnoses:  Gastrointestinal hemorrhage, unspecified gastrointestinal hemorrhage type    New Prescriptions New Prescriptions   No medications on file     [place], Ozella Almond, PA-C 08/19/16 1038    Duffy Bruce, MD 08/20/16 [phone]

## 2016-08-19 NOTE — Consult Note (Signed)
Consultation  Referring Provider: No ref. provider found Primary Care Physician:  [name], MD Primary Gastroenterologist:  Dr. [name]  Reason for Consultation:   Melena, abdominal  pain  HPI: Lawrence Brock is a 46 y.o. male known to Dr. [name], but not seen by GI since 2016. Patient presented to the emergency room early this morning after onset of black tarry stool about 10:00 last evening. He says he began having some abdominal pain in the middle of the night as well which she describes as sharp and stabbing and rather generalized. He had 3 episodes total of black tarry stool prior to coming to the emergency room. He has not had any further bowel movements since arrival. He does complain of some nausea but has not vomited. No fever or chills. His abdominal pain has persisted. He says this feels different than his previous pancreatitis. 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 He will be in a remote area. Patient has been hemodynamically stable since arrival, systolic blood pressure in the 90s. Patient has previous history of EtOH abuse, none in the past 18 months. No regular aspirin or NSAID use. Baseline hemoglobin from April 2018 was 15, platelets 41 Labs today show hemoglobin 9.4 hematocrit of 31,platelets 43, INR 1.21/pro time 16.1  Patient had CT of the abdomen and pelvis with an ER visit in April 2018 that showed a fatty liver, prominent esophageal varices, chronic portal vein thrombosis with cavernous transformation and significant splenomegaly. 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 He was started on nadolol at that time.  Colonoscopy October 2016 3 semi-pedunculated polyps were removed from the left colon also noted internal and sternal hemorrhoids. Polyps were adenomatous and he was  advised to have 3 year interval follow-up.   Past Medical History:  Diagnosis Date  . Abdominal pain    intermittent abdominal pain in lower abdomen  . Anemia   . Chronic back pain   . Chronic pancreatitis ([place]) 09/10/2013  . Hypertension    No meds yet.  . Pancreatitis   . Platelets decreased ([place])    to continue to be evaluated and followed by PCP  . Pneumonia    10-30-14 [place] admission, no problems now.    Past Surgical History:  Procedure Laterality Date  . APPENDECTOMY    . COLONOSCOPY WITH PROPOFOL N/A 01/05/2015   Procedure: COLONOSCOPY WITH PROPOFOL;  Surgeon: Milus Banister, MD;  Location: [facility];  Service: Endoscopy;  Laterality: N/A;  . ENDOSCOPIC RETROGRADE CHOLANGIOPANCREATOGRAPHY (ERCP) WITH PROPOFOL N/A 06/02/2014   Procedure: ENDOSCOPIC RETROGRADE CHOLANGIOPANCREATOGRAPHY (ERCP) WITH PROPOFOL;  Surgeon: Milus Banister, MD;  Location: [facility];  Service: Endoscopy;  Laterality: N/A;  . ERCP N/A 02/22/2014   Procedure: ENDOSCOPIC RETROGRADE CHOLANGIOPANCREATOGRAPHY (ERCP);  Surgeon: Ladene Artist, MD;  Location: [facility];  Service: Endoscopy;  Laterality: N/A;  . ERCP N/A 02/18/2014   Procedure: ENDOSCOPIC RETROGRADE CHOLANGIOPANCREATOGRAPHY (ERCP);  Surgeon: Inda Castle, MD;  Location: [facility];  Service: Gastroenterology;  Laterality: N/A;  . ESOPHAGOGASTRODUODENOSCOPY (EGD) WITH PROPOFOL N/A 09/14/2013   Procedure: ESOPHAGOGASTRODUODENOSCOPY (EGD) WITH PROPOFOL;  Surgeon: Jerene Bears, MD;  Location: [facility];  Service: Gastroenterology;  Laterality: N/A;  . ESOPHAGOGASTRODUODENOSCOPY (EGD) WITH PROPOFOL N/A 01/05/2015   Procedure: ESOPHAGOGASTRODUODENOSCOPY (EGD) WITH PROPOFOL;  Surgeon: Melene Plan  [name], MD;  Location: [facility];  Service: Endoscopy;  Laterality: N/A;  . GASTROINTESTINAL STENT REMOVAL N/A 09/14/2013   Procedure: GASTROINTESTINAL STENT REMOVAL;  Surgeon: Jerene Bears, MD;  Location: [facility];  Service:  Gastroenterology;  Laterality: N/A;  pancreatic stent removal    Prior to Admission medications   Medication Sig Start Date End Date Taking? Authorizing Provider  Multiple Vitamin (MULTIVITAMIN) tablet Take 1 tablet by mouth daily.   Yes [provider]  oxycodone (OXY-IR) 5 MG capsule Take 1 capsule (5 mg total) by mouth every 4 (four) hours as needed. 67/1/24  Yes Delora Fuel, MD  pantoprazole (PROTONIX) 40 MG tablet Take 40 mg by mouth daily.  06/08/16  Yes [provider]  ondansetron (ZOFRAN ODT) 8 MG disintegrating tablet Take 1 tablet (8 mg total) by mouth every 8 (eight) hours as needed for nausea or vomiting. Patient not taking: Reported on 08/19/2016 07/02/16   Dorie Rank, MD  sucralfate (CARAFATE) 1 g tablet Take 1 tablet (1 g total) by mouth 4 (four) times daily -  with meals and at bedtime. Patient not taking: Reported on 08/19/2016 07/02/16   Dorie Rank, MD    Current Facility-Administered Medications  Medication Dose Route Frequency Provider Last Rate Last Dose  . fentaNYL (SUBLIMAZE) injection 50 mcg  50 mcg Intravenous Once Ward, Ozella Almond, PA-C      . octreotide (SANDOSTATIN) 2 mcg/mL load via infusion 50 mcg  50 mcg Intravenous Once Ward, Ozella Almond, PA-C       And  . octreotide (SANDOSTATIN) 500 mcg in sodium chloride 0.9 % 250 mL (2 mcg/mL) infusion  50 mcg/hr Intravenous Continuous Ward, Ozella Almond, PA-C      . pantoprazole (PROTONIX) 80 mg in sodium chloride 0.9 % 250 mL (0.32 mg/mL) infusion  8 mg/hr Intravenous Continuous Duffy Bruce, MD      . pantoprazole (PROTONIX) injection 40 mg  40 mg Intravenous Once Duffy Bruce, MD      . sodium chloride 0.9 % bolus 1,000 mL  1,000 mL Intravenous Once Ward, Ozella Almond, PA-C       Current Outpatient Prescriptions  Medication Sig Dispense Refill  . Multiple Vitamin (MULTIVITAMIN) tablet Take 1 tablet by mouth daily.    Marland Kitchen oxycodone (OXY-IR) 5 MG capsule Take 1 capsule (5 mg total) by mouth  every 4 (four) hours as needed. 15 capsule 0  . pantoprazole (PROTONIX) 40 MG tablet Take 40 mg by mouth daily.   8  . ondansetron (ZOFRAN ODT) 8 MG disintegrating tablet Take 1 tablet (8 mg total) by mouth every 8 (eight) hours as needed for nausea or vomiting. (Patient not taking: Reported on 08/19/2016) 20 tablet 0  . sucralfate (CARAFATE) 1 g tablet Take 1 tablet (1 g total) by mouth 4 (four) times daily -  with meals and at bedtime. (Patient not taking: Reported on 08/19/2016) 20 tablet 0    Allergies as of 08/19/2016  . (No Known Allergies)    Family History  Problem Relation Age of Onset  . Hypertension Mother     Social History   Social History  . Marital status: Married    Spouse name: N/A  . Number of children: 4  . Years of education: N/A   Occupational History  . Chemical Operator    Social History Main Topics  . Smoking status: Current Every Day Smoker    Packs/day: 0.50    Types: Cigarettes  . Smokeless tobacco: Never Used  . Alcohol  use No     Comment: former ETOH abuse, quit a month ago per patient  . Drug use: No  . Sexual activity: Yes   Other Topics Concern  . Not on file   Social History Narrative  . No narrative on file    Review of Systems: Pertinent positive and negative review of systems were noted in the above HPI section.  All other review of systems was otherwise negative.  Physical Exam: Vital signs in last 24 hours: Temp:  [97.6 F (36.4 C)] 97.6 F (36.4 C) (05/28 0721) Pulse Rate:  [64-92] 64 (05/28 1010) Resp:  [17] 17 (05/28 1010) BP: (100-105)/(73-83) 100/73 (05/28 1010) SpO2:  [100 %] 100 % (05/28 1010) Weight:  [150 lb (68 kg)] 150 lb (68 kg) (05/28 0721)   General:   Alert,  Well-developed, well-nourished, Filipino male, pleasant and cooperative in NAD-uncomfortable secondary to abdominal pain Head:  Normocephalic and atraumatic. Eyes:  Sclera clear, no icterus.   Conjunctiva pink. Ears:  Normal auditory acuity. Nose:  No  deformity, discharge,  or lesions. Mouth:  No deformity or lesions.   Neck:  Supple; no masses or thyromegaly. Lungs:  Clear throughout to auscultation.   No wheezes, crackles, or rhonchi. Heart:  Regular rate and rhythm; no murmurs, clicks, rubs,  or gallops. Abdomen:  Soft, bowel sounds are present he has some rather generalized abdominal tenderness nonfocal there is no palpable mass or hepatosplenomegaly   Rectal:  Deferred -melena documented by ER provider on admission Msk:  Symmetrical without gross deformities. . Pulses:  Normal pulses noted. Extremities:  Without clubbing or edema. Neurologic:  Alert and  oriented x4;  grossly normal neurologically. Skin:  Intact without significant lesions or rashes.. Psych:  Alert and cooperative. Normal mood and affect.  Intake/Output from previous day: No intake/output data recorded. Intake/Output this shift: No intake/output data recorded.  Lab Results:  Recent Labs  08/19/16 0830  WBC 3.9*  HGB 9.4*  HCT 31.2*  PLT 43*   BMET  Recent Labs  08/19/16 0830  NA 138  K 4.6  CL 109  CO2 23  GLUCOSE 119*  BUN 27*  CREATININE 0.76  CALCIUM 7.9*   LFT  Recent Labs  08/19/16 0830  PROT 6.0*  ALBUMIN 3.0*  AST 29  ALT 20  ALKPHOS 87  BILITOT 1.3*   PT/INR  Recent Labs  08/19/16 0934  LABPROT 16.1*  INR 1.29   Hepatitis Panel No results for input(s): HEPBSAG, HCVAB, HEPAIGM, HEPBIGM in the last 72 hours.   IMPRESSION:  #32 46 year old [place] male who presents with melena, onset last evening with 3 episodes at home prior to admission. He is also complaining of severe crampy rather generalized abdominal pain, and nausea but no vomiting. Lipase normal Patient has history of chronic portal vein thrombosis with cavernous transformation, splenomegaly and previously documented large esophageal varices and portal gastropathy. He has not been clear from previous imaging whether he has cirrhosis, versus portal  hypertension secondary to chronic portal vein thrombosis. Rule out bleeding secondary to esophageal varices, versus portal gastropathy Hemoglobin is down 5-1/2 g from baseline at the time of admission  #2 history of alcohol abuse in active 18 months #3 history of chronic calcific pancreatitis #4 severe thrombocytopenia chronic #5 history of adenomatous colon polyps October 2016 due for follow-up October 2019 #6 chronic intra-and extrahepatic biliary ductal prominence  PLAN: #1 admit to step down #2 IV Protonix infusion and IV octreotide infusion have been started #3 serial  hemoglobins Q4 hours and transfuse for hemoglobin 8 or less #4 CT of the abdomen and pelvis has been ordered and is pending #5 patient will be scheduled for EGD with Dr. Havery Moros this afternoon with anesthesia. Procedure was discussed in detail with the patient including risks and benefits and he is agreeable to proceed. #6 IV Rocephin ordered Thank you we will follow with you     Lawrence Brock  08/19/2016, 10:21 AM

## 2016-08-19 NOTE — ED Notes (Signed)
Patient transported to CT 

## 2016-08-19 NOTE — Anesthesia Preprocedure Evaluation (Addendum)
Anesthesia Evaluation  Patient identified by MRN, date of birth, ID band Patient awake    Reviewed: Allergy & Precautions, H&P , NPO status , Patient's Chart, lab work & pertinent test results  Airway Mallampati: II  TM Distance: >3 FB Neck ROM: Full    Dental no notable dental hx. (+) Teeth Intact, Dental Advisory Given   Pulmonary Current Smoker,    Pulmonary exam normal breath sounds clear to auscultation       Cardiovascular hypertension,  Rhythm:Regular Rate:Normal     Neuro/Psych negative neurological ROS  negative psych ROS   GI/Hepatic PUD, (+) Cirrhosis   Esophageal Varices  substance abuse  alcohol use,   Endo/Other  negative endocrine ROS  Renal/GU negative Renal ROS  negative genitourinary   Musculoskeletal   Abdominal   Peds  Hematology negative hematology ROS (+) anemia ,   Anesthesia Other Findings   Reproductive/Obstetrics negative OB ROS                            Anesthesia Physical Anesthesia Plan  ASA: III and emergent  Anesthesia Plan: General   Post-op Pain Management:    Induction: Intravenous, Rapid sequence and Cricoid pressure planned  Airway Management Planned: Oral ETT  Additional Equipment:   Intra-op Plan:   Post-operative Plan: Extubation in OR  Informed Consent: I have reviewed the patients History and Physical, chart, labs and discussed the procedure including the risks, benefits and alternatives for the proposed anesthesia with the patient or authorized representative who has indicated his/her understanding and acceptance.   Dental advisory given  Plan Discussed with: CRNA  Anesthesia Plan Comments:         Anesthesia Quick Evaluation

## 2016-08-20 ENCOUNTER — Encounter (HOSPITAL_COMMUNITY): Payer: Self-pay | Admitting: Physician Assistant

## 2016-08-20 DIAGNOSIS — D62 Acute posthemorrhagic anemia: Secondary | ICD-10-CM

## 2016-08-20 DIAGNOSIS — I8511 Secondary esophageal varices with bleeding: Secondary | ICD-10-CM

## 2016-08-20 DIAGNOSIS — I8501 Esophageal varices with bleeding: Secondary | ICD-10-CM

## 2016-08-20 DIAGNOSIS — D696 Thrombocytopenia, unspecified: Secondary | ICD-10-CM

## 2016-08-20 DIAGNOSIS — I81 Portal vein thrombosis: Secondary | ICD-10-CM

## 2016-08-20 LAB — BASIC METABOLIC PANEL
ANION GAP: 4 — AB (ref 5–15)
BUN: 20 mg/dL (ref 6–20)
CO2: 24 mmol/L (ref 22–32)
Calcium: 7.9 mg/dL — ABNORMAL LOW (ref 8.9–10.3)
Chloride: 111 mmol/L (ref 101–111)
Creatinine, Ser: 0.85 mg/dL (ref 0.61–1.24)
GFR calc Af Amer: >60 mL/min (ref >60)
GFR calc non Af Amer: >60 mL/min (ref >60)
GLUCOSE: 77 mg/dL (ref 65–99)
POTASSIUM: 4.2 mmol/L (ref 3.5–5.1)
Sodium: 139 mmol/L (ref 135–145)

## 2016-08-20 LAB — HEMOGLOBIN AND HEMATOCRIT, BLOOD
HEMATOCRIT: 26.5 % — AB (ref 39.0–52.0)
Hemoglobin: 8.1 g/dL — ABNORMAL LOW (ref 13.0–17.0)

## 2016-08-20 LAB — CBC
HEMATOCRIT: 25.7 % — AB (ref 39.0–52.0)
HEMOGLOBIN: 7.9 g/dL — AB (ref 13.0–17.0)
MCH: 25.7 pg — AB (ref 26.0–34.0)
MCHC: 30.7 g/dL (ref 30.0–36.0)
MCV: 83.7 fL (ref 78.0–100.0)
Platelets: 31 10*3/uL — ABNORMAL LOW (ref 150–400)
RBC: 3.07 MIL/uL — AB (ref 4.22–5.81)
RDW: 18.9 % — ABNORMAL HIGH (ref 11.5–15.5)
WBC: 2.6 10*3/uL — ABNORMAL LOW (ref 4.0–10.5)

## 2016-08-20 LAB — BPAM PLATELET PHERESIS
BLOOD PRODUCT EXPIRATION DATE: 201805292359
Blood Product Expiration Date: 201805292359
Unit Type and Rh: 600
Unit Type and Rh: 6200

## 2016-08-20 LAB — PREPARE PLATELET PHERESIS
Unit division: 0
Unit division: 0

## 2016-08-20 LAB — HIV ANTIBODY (ROUTINE TESTING W REFLEX): HIV Screen 4th Generation wRfx: NONREACTIVE

## 2016-08-20 NOTE — Progress Notes (Signed)
PROGRESS NOTE    Coe Angelos  QQV:[id number] DOB: [birth year] DOA: 08/19/2016 PCP: [name], MD   Brief Narrative:  Lawrence Brock  is a 46 y.o. male, With history of alcoholic cirrhosis and thrombocytopenia who quit drinking one year ago, currently smoking counseled to quit, reported duodenal ulcer in the chart but patient does not recall, history of pancreatitis in the past, hypertension and other comrbids who comes to the hospital with 1 day history of abdominal cramping and pain which he describes in the epigastric area. He then started having black tarry stools for the last 12 hours at least 3-4 of them, came to the ER where GI was consulted. He was transfused 1 unit of pRBC and underwent EGD where he he had variceal banding x 11. He is doing better and still on Protonix gtt and Octreotide gtt. He is improved today and stable to transfer to the medical floor as his Hb/Hct have been stable after the banding.   Assessment & Plan:   Active Problems:   Thrombocytopenia, unspecified ([place])   Duodenal ulcer   Bleeding gastrointestinal   Portal vein thrombosis   Anemia   Alcoholic cirrhosis of liver without ascites (HCC)   UGI bleed   Bleeding esophageal varices (HCC)  Melena from Upper GI Variceal Bleed -S/p Bolus of 1 Liter o NS x 2 -EGD showed Multiple columns of very large varices were found in the upper third of the esophagus, in the middle third of the esophagus and in the lowerthird of the esophagus. A large red wale sign was present on a varix at the GEJ which was banded, and another red whale sign on a large varix in the mid esophagus which was banded. A total of 11 bands were successfully placed along the varices with good result, most in the lower and mid esophagus. No active bleeding appreciated.Varices were found in the gastric fundus / cadia. There were no stigmata of recent bleeding. -Was due to Variceal Bleed from what Dr. Havery Moros suspects; Patient is s/p  Variceal Banding x 11 -EGD also noted Hypertensive Gastritis and a Normal Duodenum  -C/w IV Protonix gtt and Octreotide gtt for now and will taper tomorrow per GI Recc's -C/w IV Ceftriaxone 2 grams q24h -C/w Zofran po/IV 4 mg q6hprn for Nausea -Transfuse if Hb drops <7; Continue to Monitor H/H -Diet Advanced to Clear Liquids by Gastroenterology and appreciate any additional recc's -Continue to Monitor for S/Sx of bleeding and repeat CBC in AM  ABLA 2/2 to Above -Anemia Panel showed Iron of 65, UIBC of 236, TIBC of 301, Saturation Ratios of 22, Ferritin of 19, Folate of 18.1, and Vitamin B12 of 1,457 -Patient is s/p transfusion of 1 unit of pRBC -Patient's Hb/Hct went from 9.4/31.2 -> 8.1/26.5; Now appears Stable and leveling out -Continue to Monitor for S/Sx of Bleeding and repeat H/H q6h -Repeat CBC in AM  Alcoholic Cirrhosis  -C/w Supportive Care -Outpatient follow-up per Dr. [name].  -He has quit drinking 1 year ago.  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  -Has had ongoing epigastric pain. -CT of Abd/Pelvis showed changes consistent with cavernous transformation of the portal vein with subsequent gastric and esophageal varices. Splenomegaly is noted as well as mesenteric edema and free fluid in the abdomen. These changes are stable from  previous exam.  Chronic Pancreatitis -CT Scan of Abd/Pelvis showed Diffuse calcifications are noted consistent chronic pancreatitis. -Lipase Level was 14 -C/w IV Pain control with 1 mg of Hydromorphone q3hprn for Moderate and Severe Pain  DVT prophylaxis: SCDs Code Status: FULL CODE Family Communication: No Family present at bedside Disposition Plan: Transfer to the Medical Floor with Telemetry  Consultants:   Gastroenterology Dr.  Remo Lipps Armbruster/Dr. Loletha Carrow   Procedures:  UPPER GI ENDOSCOPY Findings:      Esophagogastric landmarks were identified: the Z-line was found at 40       cm, the gastroesophageal junction was found at 40 cm and the upper       extent of the gastric folds was found at 40 cm from the incisors.      Multiple columns of very large varices were found in the upper third of       the esophagus, in the middle third of the esophagus and in the lower       third of the esophagus. A large red wale sign was present on a varix at       the GEJ which was banded, and another red whale sign on a large varix in       the mid esophagus which was banded. A total of 11 bands were       successfully placed along the varices with good result, most in the       lower and mid esophagus. No active bleeding appreciated.      Varices were found in the gastric fundus / cadia. There were no stigmata       of recent bleeding.      Diffuse mild inflammation characterized by erythema and friability was       found in the gastric body consistent with portal hypertensive gastritis.      The exam of the stomach was otherwise normal. No blood in the stomach.      The duodenal bulb and second portion of the duodenum were normal. No       blood noted. Impression:               - Esophagogastric landmarks identified.                           - Very large esophageal varices with high risk                            stigmata as outlined. Banded x 11.                           - Gastric varices in the fundus without stigmata.                           - Portal hypertensive gastritis.                           - Normal duodenum.   Antimicrobials:  Anti-infectives    Start     Dose/Rate Route Frequency Ordered Stop   08/19/16 1400  cefTRIAXone (ROCEPHIN) 2 g in dextrose 5 % 50 mL IVPB     2 g 100 mL/hr over 30 Minutes Intravenous Every 24 hours 08/19/16 1138     08/19/16 1315  cefTRIAXone (ROCEPHIN) 1 g in  dextrose 5 % 50  mL IVPB  Status:  Discontinued     1 g 100 mL/hr over 30 Minutes Intravenous  Once 08/19/16 1301 08/19/16 1306   08/19/16 0930  cefTRIAXone (ROCEPHIN) 2 g in dextrose 5 % 50 mL IVPB  Status:  Discontinued     2 g 100 mL/hr over 30 Minutes Intravenous  Once 08/19/16 6160 08/19/16 1015     Subjective: Seen and examined and was wanting to eat and go home. No nausea or vomiting and states he has not had a bowel movement. Has chronic abdominal pain and had some pain overnight. No CP or SOB. No other concerns or complaints except that he wants to go home so he can go to [place] soon.   Objective: Vitals:   08/20/16 0309 08/20/16 0400 08/20/16 0500 08/20/16 0600  BP:  111/76 115/74 115/78  Pulse:  (!) 59 (!) 59 (!) 58  Resp:  10 11 11   Temp: 97.9 F (36.6 C)     TempSrc: Oral     SpO2:  97% 95% 96%  Weight:      Height:        Intake/Output Summary (Last 24 hours) at 08/20/16 0753 Last data filed at 08/20/16 0630  Gross per 24 hour  Intake             2725 ml  Output             1050 ml  Net             1675 ml   Filed Weights   08/19/16 0721 08/19/16 1226 08/20/16 0300  Weight: 68 kg (150 lb) 60.9 kg (134 lb 4.2 oz) 62.5 kg (137 lb 12.6 oz)   Examination: Physical Exam:  Constitutional:  NAD and appears calm and comfortable Eyes: Lids and conjunctivae normal, sclerae anicteric  ENMT: External Ears, Nose appear normal. Grossly normal hearing. Mucous membranes are moist. Neck: Appears normal, supple, no cervical masses, normal ROM, no appreciable thyromegaly, no JVD Respiratory: Diminished to auscultation bilaterally, no wheezing, rales, rhonchi or crackles. Normal respiratory effort and patient is not tachypenic. No accessory muscle use.  Cardiovascular: RRR but on the slower side, no murmurs / rubs / gallops. S1 and S2 auscultated. No extremity edema. 2+ pedal pulses. No carotid bruits.  Abdomen: Soft, mildly tender to paplpation, non-distended. No masses palpated. Bowel sounds  positive x 4 GU: Deferred. Musculoskeletal: No clubbing / cyanosis of digits/nails. No joint deformity upper and lower extremities.  Skin: No rashes, lesions, ulcers on limited skin evaluation. No induration; Warm and dry.  Neurologic: CN 2-12 grossly intact with no focal deficits. Romberg sign cerebellar reflexes not assessed.  Psychiatric: Normal judgment and insight. Alert and oriented x 3. Normal mood and appropriate affect.   Data Reviewed: I have personally reviewed following labs and imaging studies  CBC:  Recent Labs Lab 08/19/16 0830 08/19/16 1239 08/19/16 1750 08/20/16 0038  WBC 3.9*  --   --  2.6*  HGB 9.4* 8.9* 8.5* 7.9*  HCT 31.2* 29.8* 28.2* 25.7*  MCV 83.9  --   --  83.7  PLT 43*  --   --  31*   Basic Metabolic Panel:  Recent Labs Lab 08/19/16 0830 08/20/16 0038  NA 138 139  K 4.6 4.2  CL 109 111  CO2 23 24  GLUCOSE 119* 77  BUN 27* 20  CREATININE 0.76 0.85  CALCIUM 7.9* 7.9*   GFR: Estimated Creatinine Clearance: 97 mL/min (by C-G formula  based on SCr of 0.85 mg/dL). Liver Function Tests:  Recent Labs Lab 08/19/16 0830  AST 29  ALT 20  ALKPHOS 87  BILITOT 1.3*  PROT 6.0*  ALBUMIN 3.0*    Recent Labs Lab 08/19/16 0830  LIPASE 14   No results for input(s): AMMONIA in the last 168 hours. Coagulation Profile:  Recent Labs Lab 08/19/16 0934  INR 1.29   Cardiac Enzymes: No results for input(s): CKTOTAL, CKMB, CKMBINDEX, TROPONINI in the last 168 hours. BNP (last 3 results) No results for input(s): PROBNP in the last 8760 hours. HbA1C: No results for input(s): HGBA1C in the last 72 hours. CBG: No results for input(s): GLUCAP in the last 168 hours. Lipid Profile: No results for input(s): CHOL, HDL, LDLCALC, TRIG, CHOLHDL, LDLDIRECT in the last 72 hours. Thyroid Function Tests: No results for input(s): TSH, T4TOTAL, FREET4, T3FREE, THYROIDAB in the last 72 hours. Anemia Panel:  Recent Labs  08/19/16 0830 08/19/16 0923    VITAMINB12  --  1,457*  FOLATE  --  18.1  FERRITIN  --  19*  TIBC  --  301  IRON  --  65  RETICCTPCT 2.3  --    Sepsis Labs: No results for input(s): PROCALCITON, LATICACIDVEN in the last 168 hours.  Recent Results (from the past 240 hour(s))  MRSA PCR Screening     Status: None   Collection Time: 08/19/16  4:48 PM  Result Value Ref Range Status   MRSA by PCR NEGATIVE NEGATIVE Final    Comment:        The GeneXpert MRSA Assay (FDA approved for NASAL specimens only), is one component of a comprehensive MRSA colonization surveillance program. It is not intended to diagnose MRSA infection nor to guide or monitor treatment for MRSA infections.     Radiology Studies: Ct Abdomen Pelvis W Contrast  Result Date: 08/19/2016 CLINICAL DATA:  Abdominal pain for 2 days EXAM: CT ABDOMEN AND PELVIS WITH CONTRAST TECHNIQUE: Multidetector CT imaging of the abdomen and pelvis was performed using the standard protocol following bolus administration of intravenous contrast. CONTRAST:  100 mL Isovue 370. COMPARISON:  07/02/2016 FINDINGS: Lower chest: Mild basilar atelectasis is noted. Hepatobiliary: Fatty infiltration of the liver is noted. Biliary ductal dilatation is noted stable from the prior exam. The gall bladder is within limits. Cavernous transformation of the portal vein is noted. The intrahepatic portal vein segments are patent. Pancreas: Diffuse calcifications are noted consistent chronic pancreatitis. Spleen: Spleen is enlarged consistent with portal hypertension. Adrenals/Urinary Tract: The adrenal glands are within normal limits. Kidneys are well visualized bilaterally with a few scattered cysts. The bladder is well distended. Stomach/Bowel: The appendix has been surgically removed. No obstructive changes are identified. Multiple gastric and esophageal varices are seen related to changes in the portal veins. These are stable from the prior exam. Diffuse wall thickening is noted within the  stomach similar to that noted on prior exam. This may represent a degree of gastritis. Vascular/Lymphatic: Aortic atherosclerosis. No enlarged abdominal or pelvic lymph nodes. Reproductive: Prostate is unremarkable. Other: Mild free fluid is noted within the pelvis and abdomen as well as edematous changes throughout the mesentery. These changes are similar to that seen on prior exam and likely related to the changes in the portal vein. Musculoskeletal: No acute bony abnormality is noted. 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 Splenomegaly is noted as well as mesenteric edema and free fluid in the abdomen. These changes  are stable from previous exam. Changes of chronic biliary dilatation. Changes of chronic pancreatitis. Gastric wall thickening likely related to gastritis. No definitive perforation is seen. Electronically Signed   By: Inez Catalina M.D.   On: 08/19/2016 12:04   Scheduled Meds: . sodium chloride flush  3 mL Intravenous Q12H   Continuous Infusions: . cefTRIAXone (ROCEPHIN)  IV 2 g (08/19/16 1243)  . octreotide  (SANDOSTATIN)    IV infusion 50 mcg/hr (08/20/16 0604)  . pantoprozole (PROTONIX) infusion 8 mg/hr (08/20/16 0600)    LOS: 1 day   Kerney Elbe, DO [facility] Pager [phone]  If 7PM-7AM, please contact night-coverage [email or web address] Password [facility] 08/20/2016, 7:53 AM

## 2016-08-20 NOTE — Progress Notes (Signed)
Progress Note   Subjective  Chief Complaint: Melena-s/p variceal banding 08/20/26, Anemia  This morning I was called by nursing prior to seeing the patient, they said patient threatened to leave AMA due to not having anything to eat for 2.5 days.   When I went in to see patient he was looking at the menu. He told me that he is going through a divorce and hoping to move to [place] for a job. He is supposed to leave June 15th and has a lot of packing of things to do, "because my wife is trying to throw my stuff away". He does become tearful while explaining all of this to me and explains that he needs to get out of here and has things to do.  He tells me he did have some minimal epigastric abdominal pain overnight, a 5/10 but this was helped with Dilaudid. He denies having had a bowel movement since admission and tells me he is very hungry.   Objective   Vital signs in last 24 hours: Temp:  [97.5 F (36.4 C)-98.3 F (36.8 C)] 98.1 F (36.7 C) (05/29 0800) Pulse Rate:  [52-100] 71 (05/29 0900) Resp:  [8-18] 16 (05/29 0900) BP: (97-126)/(47-92) 115/81 (05/29 0900) SpO2:  [89 %-100 %] 98 % (05/29 0900) Weight:  [134 lb 4.2 oz (60.9 kg)-137 lb 12.6 oz (62.5 kg)] 137 lb 12.6 oz (62.5 kg) (05/29 0300) Last BM Date: 08/19/16 General:  male in NAD Heart:  Regular rate and rhythm; no murmurs Lungs: Respirations even and unlabored, lungs CTA bilaterally Abdomen:  Soft, mild epigastric ttp and nondistended. Normal bowel sounds. Extremities:  Without edema. Neurologic:  Alert and oriented,  grossly normal neurologically. tearful Psych:  Cooperative. Normal mood and affect.  Intake/Output from previous day: 05/28 0701 - 05/29 0700 In: 2725 [I.V.:1675; IV Piggyback:1050] Out: 1050 [Urine:1050] Intake/Output this shift: Total I/O In: 150 [I.V.:150] Out: 250 [Urine:250]  Lab Results:  Recent Labs  08/19/16 0830  08/19/16 1750 08/20/16 0038 08/20/16 0638  WBC 3.9*  --   --  2.6*  --     HGB 9.4*  < > 8.5* 7.9* 8.1*  HCT 31.2*  < > 28.2* 25.7* 26.5*  PLT 43*  --   --  31*  --   < > = values in this interval not displayed. BMET  Recent Labs  08/19/16 0830 08/20/16 0038  NA 138 139  K 4.6 4.2  CL 109 111  CO2 23 24  GLUCOSE 119* 77  BUN 27* 20  CREATININE 0.76 0.85  CALCIUM 7.9* 7.9*   LFT  Recent Labs  08/19/16 0830  PROT 6.0*  ALBUMIN 3.0*  AST 29  ALT 20  ALKPHOS 87  BILITOT 1.3*   PT/INR  Recent Labs  08/19/16 0934  LABPROT 16.1*  INR 1.29    Studies/Results: Ct Abdomen Pelvis W Contrast  Result Date: 08/19/2016 CLINICAL DATA:  Abdominal pain for 2 days EXAM: CT ABDOMEN AND PELVIS WITH CONTRAST TECHNIQUE: Multidetector CT imaging of the abdomen and pelvis was performed using the standard protocol following bolus administration of intravenous contrast. CONTRAST:  100 mL Isovue 370. COMPARISON:  07/02/2016 FINDINGS: Lower chest: Mild basilar atelectasis is noted. Hepatobiliary: Fatty infiltration of the liver is noted. Biliary ductal dilatation is noted stable from the prior exam. The gall bladder is within limits. Cavernous transformation of the portal vein is noted. The intrahepatic portal vein segments are patent. Pancreas: Diffuse calcifications are noted consistent chronic pancreatitis. Spleen: Spleen is  enlarged consistent with portal hypertension. Adrenals/Urinary Tract: The adrenal glands are within normal limits. Kidneys are well visualized bilaterally with a few scattered cysts. The bladder is well distended. Stomach/Bowel: The appendix has been surgically removed. No obstructive changes are identified. Multiple gastric and esophageal varices are seen related to changes in the portal veins. These are stable from the prior exam. Diffuse wall thickening is noted within the stomach similar to that noted on prior exam. This may represent a degree of gastritis. Vascular/Lymphatic: Aortic atherosclerosis. No enlarged abdominal or pelvic lymph nodes.  Reproductive: Prostate is unremarkable. Other: Mild free fluid is noted within the pelvis and abdomen as well as edematous changes throughout the mesentery. These changes are similar to that seen on prior exam and likely related to the changes in the portal vein. Musculoskeletal: No acute bony abnormality is noted. 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 Splenomegaly is noted as well as mesenteric edema and free fluid in the abdomen. These changes are stable from previous exam. Changes of chronic biliary dilatation. Changes of chronic pancreatitis. Gastric wall thickening likely related to gastritis. No definitive perforation is seen. Electronically Signed   By: Inez Catalina M.D.   On: 08/19/2016 12:04   08/19/16-EGD; Dr. Havery Moros Findings:      Esophagogastric landmarks were identified: the Z-line was found at 40       cm, the gastroesophageal junction was found at 40 cm and the upper       extent of the gastric folds was found at 40 cm from the incisors.      Multiple columns of very large varices were found in the upper third of       the esophagus, in the middle third of the esophagus and in the lower       third of the esophagus. A large red wale sign was present on a varix at       the GEJ which was banded, and another red whale sign on a large varix in       the mid esophagus which was banded. A total of 11 bands were       successfully placed along the varices with good result, most in the       lower and mid esophagus. No active bleeding appreciated.      Varices were found in the gastric fundus / cadia. There were no stigmata       of recent bleeding.      Diffuse mild inflammation characterized by erythema and friability was       found in the gastric body consistent with portal hypertensive gastritis.      The exam of the stomach was otherwise normal. No blood in the stomach.      The duodenal bulb  and second portion of the duodenum were normal. No       blood noted. Impression:               - Esophagogastric landmarks identified.                           - Very large esophageal varices with high risk                            stigmata as outlined. Banded x 11.                           -  Gastric varices in the fundus without stigmata.                           - Portal hypertensive gastritis.                           - Normal duodenum.                           Overall, I suspect the patient had bleeding from                            esophageal varices to cause dramatic drop in Hgb                            and prior bleeding symptoms. No active bleeding at                            present. Moderate Sedation:      No moderate sedation, case performed with MAC Recommendation:           - Return patient to ICU for ongoing care.                           - NPO for now post banding                           - Continue present medications                           - Continue IV octreotide and IV protonix                           - Monitor for recurrent bleeding, serial Hgb -                            transfuse to keep Hgb > 7.                           - If the patient has rebleeding, recommend platelet                            transfusion and IR consultation                           - GI service will continue to follow with you    Assessment / Plan:   Assessment: 1. Melena: Due to variceal bleed S/p banding 08/19/16 (see report above), hgb stable at 8.1 today, no further signs of gi bleeding 2. Anemia: see above 3. H/o alcohol abuse 4. H/o chronic calcific pancreatitis 5. Severe thrombocytopenia   Plan: 1. Advanced patient to clear liquid diet today 2. Continue other supportive measures including Protonix and Octreotide drip for now-we will likely begin taper tomorrow 3. Continue serial hgb with transfusion as needed <7 4. Did talk with the patient in regards to  timing of discharge and the necessity for him to stay until we  know we have his bleeding under control. I assured him that as soon as we felt he was stable we would let him leave. 5. Would recommend we keep a close eye on this patient regarding mental state-possibly talk with psych if seems worse in the future? 6. Please await any further recommendations from Dr. Loletha Carrow later today  Thank you for your kind consultation, we will continue to follow.   LOS: 1 day   Levin Erp  08/20/2016, 11:05 AM  Pager # [phone]  I have discussed the case with the PA, and that is the plan I formulated. I personally interviewed and examined the patient. Additional diagnoses: Alcoholic cirrhosis  Fortunately, he shows no signs of rebleeding at this point.  Although he is anxious to leave for personal reasons, I have told him I recommend he stay for 72 hours after the initial bleeding episode. If he is adamant about going, we can certainly not forced him to stay here. Nevertheless, the first 48-72 hours are the period of greatest chance of rebleeding.  His acute blood loss anemia is stable.  Renal function stable, no signs of acute hepatic encephalopathy.  If he has no signs of rebleeding by tomorrow, we will advance him to probably a full liquid diet, change his Protonix to oral therapy and continue octreotide until the following day.  Our PA Anderson Malta will round on him in the morning, please call us sooner if urgent issues arise.   Nelida Meuse III Pager [phone]  Mon-Fri 8a-5p [phone] after 5p, weekends, holidays

## 2016-08-20 NOTE — Progress Notes (Signed)
Assumed care of pt @ 1610.  I agree with previous RN's assessment.  Pt A/O x4.  C/o pain 6/10 to throat.  Will give prn Dilaudid.  Will continue with plan of care.

## 2016-08-20 NOTE — Care Management Note (Signed)
Case Management Note  Patient Details  Name: Lawrence Brock MRN: [id number] Date of Birth: 02/24/1971  Subjective/Objective:                  46 y.o. male, With history of alcoholic cirrhosis and thrombocytopenia who quit drinking one year ago, currently smoking counseled to quit, reported duodenal ulcer in the chart but patient does not recall, history of pancreatitis in the past, hypertension who comes to the hospital with 1 day history of abdominal cramping and pain which she describes in the epigastric area, he then started having black tarry stools for the last 12 hours at least 3-4 of them, came to the ER where GI was consulted and I was called to admit. His lipase interestingly was negative. CT scan abdomen pelvis is pending.  In the ER he had evidence of some anemia, Hemoccult positive stool and I was called. Besides mild epigastric abdominal pain which is nonradiating and he is symptom free.  Action/Plan: Date:  Aug 20, 2016 Chart reviewed for concurrent status and case management needs. Will continue to follow patient progress. Discharge Planning: following for needs Expected discharge date: [id number] Velva Harman, BSN, [place], [place]  Expected Discharge Date:                  Expected Discharge Plan:  Home/Self Care  In-House Referral:     Discharge planning Services  CM Consult  Post Acute Care Choice:    Choice offered to:     DME Arranged:    DME Agency:     HH Arranged:    HH Agency:     Status of Service:  In process, will continue to follow  If discussed at Long Length of Stay Meetings, dates discussed:    Additional Comments:  Leeroy Cha, RN 08/20/2016, 8:29 AM

## 2016-08-21 ENCOUNTER — Telehealth: Payer: Self-pay

## 2016-08-21 DIAGNOSIS — D508 Other iron deficiency anemias: Secondary | ICD-10-CM

## 2016-08-21 LAB — COMPREHENSIVE METABOLIC PANEL
ALBUMIN: 2.9 g/dL — AB (ref 3.5–5.0)
ALK PHOS: 69 U/L (ref 38–126)
ALT: 15 U/L — AB (ref 17–63)
AST: 17 U/L (ref 15–41)
Anion gap: 5 (ref 5–15)
BUN: 12 mg/dL (ref 6–20)
CALCIUM: 7.9 mg/dL — AB (ref 8.9–10.3)
CO2: 26 mmol/L (ref 22–32)
Chloride: 109 mmol/L (ref 101–111)
Creatinine, Ser: 0.87 mg/dL (ref 0.61–1.24)
GFR calc Af Amer: >60 mL/min (ref >60)
GFR calc non Af Amer: >60 mL/min (ref >60)
Glucose, Bld: 101 mg/dL — ABNORMAL HIGH (ref 65–99)
Potassium: 3.8 mmol/L (ref 3.5–5.1)
SODIUM: 140 mmol/L (ref 135–145)
Total Bilirubin: 1 mg/dL (ref 0.3–1.2)
Total Protein: 5.5 g/dL — ABNORMAL LOW (ref 6.5–8.1)

## 2016-08-21 LAB — CBC WITH DIFFERENTIAL/PLATELET
Basophils Absolute: 0 10*3/uL (ref 0.0–0.1)
Basophils Relative: 0 %
EOS ABS: 0.1 10*3/uL (ref 0.0–0.7)
Eosinophils Relative: 2 %
HCT: 24.7 % — ABNORMAL LOW (ref 39.0–52.0)
Hemoglobin: 7.6 g/dL — ABNORMAL LOW (ref 13.0–17.0)
Lymphocytes Relative: 10 %
Lymphs Abs: 0.3 10*3/uL — ABNORMAL LOW (ref 0.7–4.0)
MCH: 25.7 pg — ABNORMAL LOW (ref 26.0–34.0)
MCHC: 30.8 g/dL (ref 30.0–36.0)
MCV: 83.4 fL (ref 78.0–100.0)
Monocytes Absolute: 0.2 10*3/uL (ref 0.1–1.0)
Monocytes Relative: 6 %
NEUTROS ABS: 2.6 10*3/uL (ref 1.7–7.7)
NEUTROS PCT: 82 %
Platelets: 33 10*3/uL — ABNORMAL LOW (ref 150–400)
RBC: 2.96 MIL/uL — AB (ref 4.22–5.81)
RDW: 18.8 % — ABNORMAL HIGH (ref 11.5–15.5)
WBC: 3.1 10*3/uL — ABNORMAL LOW (ref 4.0–10.5)

## 2016-08-21 LAB — MAGNESIUM: Magnesium: 1.8 mg/dL (ref 1.7–2.4)

## 2016-08-21 LAB — PHOSPHORUS: Phosphorus: 3.3 mg/dL (ref 2.5–4.6)

## 2016-08-21 MED ORDER — PANTOPRAZOLE SODIUM 40 MG PO TBEC
40.0000 mg | DELAYED_RELEASE_TABLET | Freq: Two times a day (BID) | ORAL | Status: DC
  Administered 2016-08-21: 40 mg via ORAL
  Filled 2016-08-21: qty 1

## 2016-08-21 NOTE — Care Management Note (Signed)
Case Management Note  Patient Details  Name: Jailin Moomaw MRN: [id number] Date of Birth: [birth year]  Subjective/Objective: 46 y/o m admitted w/Alcoholic cirrhosis of liver. From home. Noted family issues-CSW notified to assist. No further CM needs.                   Action/Plan:d/c home.   Expected Discharge Date:                  Expected Discharge Plan:  Home/Self Care  In-House Referral:  Clinical Social Work  Discharge planning Services  CM Consult  Post Acute Care Choice:    Choice offered to:     DME Arranged:    DME Agency:     HH Arranged:    HH Agency:     Status of Service:  Completed, signed off  If discussed at [employer], dates discussed:    Additional Comments:  Dessa Phi, RN 08/21/2016, 11:52 AM

## 2016-08-21 NOTE — Progress Notes (Signed)
Dr. Dyann Kief round on patient and told him that he will be discharging him after seeing his other patients to give it 1hr-2hrs before getting his discharge information together and patient verbalized understanding. Later patient came out of room and ask for his discharge paper and I reminded him what the MD told him during rounds and give him more time to finish with his other patients, patient stated he has to leave, and will not be able to wait for his discharge papers and will not give me time to notify MD, encouraged him to wait at least for his meds and GI F/U information but he stated I really have to go now. Signed the AMA paper.

## 2016-08-21 NOTE — Progress Notes (Signed)
Progress Note   Subjective  Chief Complaint: Melena-s/p Variceal banding 08/19/16, Anemia  This morning, the patient is found sitting up in bed, he is much brighter than yesterday, but tells me that he has to leave today because "otherwise I will be homeless". Again he reminds me that he is going through a divorce and tells me that he has not had any visitors since he has been in the hospital and "I have four kids". He tells me he know he should stay another day but he "just can't".  Patient tells me he did have one stool this morning which was "very dark" in appearance and did have some mild esophageal pain overnight, but overall is feeling well.     Objective   Vital signs in last 24 hours: Temp:  [97.9 F (36.6 C)-98.9 F (37.2 C)] 98.4 F (36.9 C) (05/30 0525) Pulse Rate:  [60-84] 71 (05/30 0525) Resp:  [11-23] 18 (05/30 0525) BP: (106-143)/(76-99) 106/76 (05/30 0525) SpO2:  [95 %-100 %] 95 % (05/30 0525) Weight:  [138 lb 0.1 oz (62.6 kg)] 138 lb 0.1 oz (62.6 kg) (05/30 0500) Last BM Date: 08/19/16 General: Male in NAD Heart:  Regular rate and rhythm; no murmurs Lungs: Respirations even and unlabored, lungs CTA bilaterally Abdomen:  Soft, Mild epigastric ttp and nondistended. Normal bowel sounds. Extremities:  Without edema. Neurologic:  Alert and oriented,  grossly normal neurologically. Psych:  Cooperative. Normal mood and affect.  Intake/Output from previous day: 05/29 0701 - 05/30 0700 In: 0086 [P.O.:720; I.V.:1000; IV Piggyback:50] Out: 7619 [Urine:1475] Intake/Output this shift: Total I/O In: 100 [P.O.:100] Out: 175 [Urine:175]  Lab Results:  Recent Labs  08/19/16 0830  08/20/16 0038 08/20/16 0638 08/21/16 0613  WBC 3.9*  --  2.6*  --  3.1*  HGB 9.4*  < > 7.9* 8.1* 7.6*  HCT 31.2*  < > 25.7* 26.5* 24.7*  PLT 43*  --  31*  --  33*  < > = values in this interval not displayed. BMET  Recent Labs  08/19/16 0830 08/20/16 0038 08/21/16 0613  NA 138  139 140  K 4.6 4.2 3.8  CL 109 111 109  CO2 23 24 26   GLUCOSE 119* 77 101*  BUN 27* 20 12  CREATININE 0.76 0.85 0.87  CALCIUM 7.9* 7.9* 7.9*   LFT  Recent Labs  08/21/16 0613  PROT 5.5*  ALBUMIN 2.9*  AST 17  ALT 15*  ALKPHOS 69  BILITOT 1.0   PT/INR  Recent Labs  08/19/16 0934  LABPROT 16.1*  INR 1.29    Studies/Results: Ct Abdomen Pelvis W Contrast  Result Date: 08/19/2016 CLINICAL DATA:  Abdominal pain for 2 days EXAM: CT ABDOMEN AND PELVIS WITH CONTRAST TECHNIQUE: Multidetector CT imaging of the abdomen and pelvis was performed using the standard protocol following bolus administration of intravenous contrast. CONTRAST:  100 mL Isovue 370. COMPARISON:  07/02/2016 FINDINGS: Lower chest: Mild basilar atelectasis is noted. Hepatobiliary: Fatty infiltration of the liver is noted. Biliary ductal dilatation is noted stable from the prior exam. The gall bladder is within limits. Cavernous transformation of the portal vein is noted. The intrahepatic portal vein segments are patent. Pancreas: Diffuse calcifications are noted consistent chronic pancreatitis. Spleen: Spleen is enlarged consistent with portal hypertension. Adrenals/Urinary Tract: The adrenal glands are within normal limits. Kidneys are well visualized bilaterally with a few scattered cysts. The bladder is well distended. Stomach/Bowel: The appendix has been surgically removed. No obstructive changes are identified. Multiple gastric and  esophageal varices are seen related to changes in the portal veins. These are stable from the prior exam. Diffuse wall thickening is noted within the stomach similar to that noted on prior exam. This may represent a degree of gastritis. Vascular/Lymphatic: Aortic atherosclerosis. No enlarged abdominal or pelvic lymph nodes. Reproductive: Prostate is unremarkable. Other: Mild free fluid is noted within the pelvis and abdomen as well as edematous changes throughout the mesentery. These changes  are similar to that seen on prior exam and likely related to the changes in the portal vein. Musculoskeletal: No acute bony abnormality is noted. 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 Splenomegaly is noted as well as mesenteric edema and free fluid in the abdomen. These changes are stable from previous exam. Changes of chronic biliary dilatation. Changes of chronic pancreatitis. Gastric wall thickening likely related to gastritis. No definitive perforation is seen. Electronically Signed   By: Inez Catalina M.D.   On: 08/19/2016 12:04    Assessment / Plan:   Assessment: 1. Melena: one further black stool today, likely residual blood, s/p banding 08/19/16, hgb remains stable 2. Anemia of acute blood loss: slight drop in hgb today 8.1-->7.6 3. H/o alcohol abuse 4. H/o Chronic calcific pancreatitis 5. Severe thrombocytopenia  Plan: 1. Patient is leaving today against our medical advice, we would recommend at least another 24 hours of Octreotide after patient's extensive banding. 2. Patient should remain on Pantoprazole 40mg  once daily. 3. Our office will call patient next week to see if he is willing to proceed with further suggested medical care from our clinic including repeat EGD with repeat banding in 2-3 weeks. 4. Please await any further recs from Dr. Loletha Carrow.  Thank you for your kind consultation.    LOS: 2 days   Levin Erp  08/21/2016, 9:39 AM  Pager # [phone] Esophageal variceal bleed, s/p banding  I have discussed the case with the PA, and that is the plan I formulated. I personally interviewed and examined the patient. I discussed tha plan with Dr Dyann Kief of [facility].  Hgb has dropped today, but I do not think he is rebleeding.  His vitals are stable.  LFTs nml. Exam unchanged from yesterday.  Typically, we would start nadolol 20 mg once daily, titrating up as tolerated to 40 mg in  the near future.  However, I am concerned that he will not follow up with [place] b/c he keeps talking about leaving the area for a job. So we will have Dr [name] nurse call him next week to see if the patient will plan a repeat EGD in 3 weeks to band any remaining varices.  Pending the outcome of that discussion, a decision can be made regarding nadolol.    Nelida Meuse III Pager [phone]  Mon-Fri 8a-5p [phone] after 5p, weekends, holidays

## 2016-08-21 NOTE — Telephone Encounter (Signed)
-----   Message from Levin Erp, [place] sent at 08/21/2016  9:50 AM EDT ----- Regarding: Please call pt in one week Please call this pt of Dr. Eugenia Pancoast in one week. Ideally he would need repeat banding 2-3 weeks from today, but he left the hospital today AMA, so please call and ask him how he is doing in one week and whether he is willing to be scheduled for repeat EGD/ followed by our clinic. He has a lot of social stressors at the moment driving his decision to leave, so by then he may have changed his mind.   Thanks- Anderson Malta

## 2016-08-21 NOTE — Progress Notes (Signed)
CSW notified by [facility] that patient needed resources currently in transition with housing. CSW spoke with patient at bedside, patient alert and oriented x 4. Patient reported that he recently separated from his wife and is in the process of moving out. Patient reports that he received a job offer in [place] and he is scheduled to leave mid June. Patient reported that he has only been working part time and needed financial assistance with obtaining a plane ticket. CSW provided patient with financial assistance resources and encouraged patient to make contact to see what funds were available for his circumstances. Patient thanked CSW for resources provided. CSW inquired if patient had any additional questions or concerns, patient replied no.

## 2016-08-21 NOTE — Telephone Encounter (Signed)
Staff message to myself to call the pt in 1 week to discuss

## 2016-08-22 LAB — TYPE AND SCREEN
ABO/RH(D): A POS
ANTIBODY SCREEN: NEGATIVE
UNIT DIVISION: 0
Unit division: 0

## 2016-08-22 LAB — BPAM RBC
Blood Product Expiration Date: 201806072359
Unit Type and Rh: 6200

## 2016-08-24 NOTE — Discharge Summary (Signed)
Physician Discharge Summary  Lawrence Brock VOP:[id number] DOB: 03/11/1971 DOA: 08/19/2016  PCP: Lawrence Bill, MD  Admit date: 08/19/2016 Discharge date: 08/24/2016  Time spent: 30 minutes  Recommendations for Outpatient Follow-up:  1. Will need CBC to follow Hgb, Platelets and WBCc's trend 2. Repeat EGD as an outpatient 3. furthr evaluation and treatment for his cirrhosis   Discharge Diagnoses:  Active Problems:   Thrombocytopenia, unspecified ([place])   Duodenal ulcer   Bleeding gastrointestinal   Portal vein thrombosis   Anemia   Alcoholic cirrhosis of liver without ascites ([facility])   UGI bleed   Bleeding esophageal varices ([facility])   Discharge Condition: patient was stable. Left AMA before prescriptions or post discharge instructions could be given.  Diet recommendation: low sodium diet, to minimizes chances for ascites.   Filed Weights   08/19/16 1226 08/20/16 0300 08/21/16 0500  Weight: 60.9 kg (134 lb 4.2 oz) 62.5 kg (137 lb 12.6 oz) 62.6 kg (138 lb 0.1 oz)    History of present illness:  As per H&P written by Dr. Candiss Brock on 08/19/16 46 y.o. male, With history of alcoholic cirrhosis and thrombocytopenia who quit drinking one year ago, currently smoking counseled to quit, reported duodenal ulcer in the chart but patient does not recall, history of pancreatitis in the past (associated with alcohol) and hypertension who came to the hospital with 1 day history of abdominal cramping and pain which he described in the epigastric area, 5-6/10 in intensity, non radiated and associated with black tarry stools for the last 12 hours prior to admission (at least 3-4 episodes)  Hospital Course:  ABLA 2/2 to Above -Anemia Panel showed Iron of 65, UIBC of 236, TIBC of 301, Saturation Ratios of 22, Ferritin of 19, Folate of 18.1, and Vitamin B12 of 1,457 -Patient is s/p transfusion of 1 unit of PRBC during this admission  -GI bleeding due to esophageal varices. -Hgb at discharge 7.6; no  active bleeding apreciated -per GI will need close follow up and repeat EGD -patient had 12 esophageal varices band placed -will recommend repeat CBC in 1 week to follow Hgb trend  -before any prescription or post discharge instruction could be given, patient left AMA. -GI recommended daily PPI  Alcoholic Cirrhosis  -Outpatient follow-up per Dr. Ardis Brock.  -He has quit drinking 1 year ago supposedly  -complete abstinence encourage and emphasized   Pancytopenia likely EtOH induced -at discharge Hgb 7.6, WBC's 3.1 and platelets 33,000 -no signs of acute bleeding -alcohol cessation counseling provided  Tobacco Abuse -Smoking Cessation Counseling given -nicotine patch offered; but patient left AMA w/o any prescriptions.  History of Portal Vein Thrombosis.  -Has had ongoing epigastric pain. -CT of Abd/Pelvis showed changes consistent with cavernous transformation of the portal vein with subsequent gastric and esophageal varices. Splenomegaly is noted as well as mesenteric edema and free fluid in the abdomen. These changes are stable from previous exam. -patient will need close follow up with GI service after discharge  Chronic Pancreatitis -CT Scan of Abd/Pelvis showed Diffuse calcifications; consistent with chronic pancreatitis. -Lipase Level was 14 -patient described intermittent abd pain  Procedures:  EGD: with 12 band ligation of esophageal varices  See below for x-ray reports   Consultations:  GI  Discharge Exam: Vitals:   08/20/16 2021 08/21/16 0525  BP: 115/78 106/76  Pulse: 83 71  Resp: 19 18  Temp: 97.9 F (36.6 C) 98.4 F (36.9 C)    General: patient was afebrile, denying CP and SOB. Hemodynamically stable and  with 24 hours no demonstrating acute GIB. Patient received recommendations by GI to stayed another 24 hours for continue treatment with octreotide and IV PPI, given risk for recurrent bleeding. But this was declined by patient. Despite me seen him and  informed of discharge plans according to his petition; patient ended living AMA and without prescriptions or further post discharge instructions. Cardiovascular: S1 and S2, no rubs, no gallops Respiratory: CTA bilaterally Abd: soft, NT, mild distension, positive BS Extremities: no edema or cyanosis   Discharge Instructions    Discharge Medication List as of 08/21/2016 12:23 PM    CONTINUE these medications which have NOT CHANGED   Details  Multiple Vitamin (MULTIVITAMIN) tablet Take 1 tablet by mouth daily., Historical Med    ondansetron (ZOFRAN ODT) 8 MG disintegrating tablet Take 1 tablet (8 mg total) by mouth every 8 (eight) hours as needed for nausea or vomiting., Starting Tue 07/02/2016, Print    oxycodone (OXY-IR) 5 MG capsule Take 1 capsule (5 mg total) by mouth every 4 (four) hours as needed., Starting 12/30/2014, Until Discontinued, Print    pantoprazole (PROTONIX) 40 MG tablet Take 40 mg by mouth daily. , Starting Sat 06/08/2016, Historical Med    sucralfate (CARAFATE) 1 g tablet Take 1 tablet (1 g total) by mouth 4 (four) times daily -  with meals and at bedtime., Starting Tue 07/02/2016, Print       No Known Allergies    The results of significant diagnostics from this hospitalization (including imaging, microbiology, ancillary and laboratory) are listed below for reference.    Significant Diagnostic Studies: Ct Abdomen Pelvis W Contrast  Result Date: 08/19/2016 CLINICAL DATA:  Abdominal pain for 2 days EXAM: CT ABDOMEN AND PELVIS WITH CONTRAST TECHNIQUE: Multidetector CT imaging of the abdomen and pelvis was performed using the standard protocol following bolus administration of intravenous contrast. CONTRAST:  100 mL Isovue 370. COMPARISON:  07/02/2016 FINDINGS: Lower chest: Mild basilar atelectasis is noted. Hepatobiliary: Fatty infiltration of the liver is noted. Biliary ductal dilatation is noted stable from the prior exam. The gall bladder is within limits. Cavernous  transformation of the portal vein is noted. The intrahepatic portal vein segments are patent. Pancreas: Diffuse calcifications are noted consistent chronic pancreatitis. Spleen: Spleen is enlarged consistent with portal hypertension. Adrenals/Urinary Tract: The adrenal glands are within normal limits. Kidneys are well visualized bilaterally with a few scattered cysts. The bladder is well distended. Stomach/Bowel: The appendix has been surgically removed. No obstructive changes are identified. Multiple gastric and esophageal varices are seen related to changes in the portal veins. These are stable from the prior exam. Diffuse wall thickening is noted within the stomach similar to that noted on prior exam. This may represent a degree of gastritis. Vascular/Lymphatic: Aortic atherosclerosis. No enlarged abdominal or pelvic lymph nodes. Reproductive: Prostate is unremarkable. Other: Mild free fluid is noted within the pelvis and abdomen as well as edematous changes throughout the mesentery. These changes are similar to that seen on prior exam and likely related to the changes in the portal vein. Musculoskeletal: No acute bony abnormality is noted. 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 Splenomegaly is noted as well as mesenteric edema and free fluid in the abdomen. These changes are stable from previous exam. Changes of chronic biliary dilatation. Changes of chronic pancreatitis. Gastric wall thickening likely related to gastritis. No definitive perforation is seen. Electronically Signed   By: Inez Catalina M.D.   On:  08/19/2016 12:04    Microbiology: Recent Results (from the past 240 hour(s))  MRSA PCR Screening     Status: None   Collection Time: 08/19/16  4:48 PM  Result Value Ref Range Status   MRSA by PCR NEGATIVE NEGATIVE Final    Comment:        The GeneXpert MRSA Assay (FDA approved for NASAL specimens only), is  one component of a comprehensive MRSA colonization surveillance program. It is not intended to diagnose MRSA infection nor to guide or monitor treatment for MRSA infections.      Labs: Basic Metabolic Panel:  Recent Labs Lab 08/19/16 0830 08/20/16 0038 08/21/16 0613  NA 138 139 140  K 4.6 4.2 3.8  CL 109 111 109  CO2 23 24 26   GLUCOSE 119* 77 101*  BUN 27* 20 12  CREATININE 0.76 0.85 0.87  CALCIUM 7.9* 7.9* 7.9*  MG  --   --  1.8  PHOS  --   --  3.3   Liver Function Tests:  Recent Labs Lab 08/19/16 0830 08/21/16 0613  AST 29 17  ALT 20 15*  ALKPHOS 87 69  BILITOT 1.3* 1.0  PROT 6.0* 5.5*  ALBUMIN 3.0* 2.9*    Recent Labs Lab 08/19/16 0830  LIPASE 14   CBC:  Recent Labs Lab 08/19/16 0830 08/19/16 1239 08/19/16 1750 08/20/16 0038 08/20/16 0638 08/21/16 0613  WBC 3.9*  --   --  2.6*  --  3.1*  NEUTROABS  --   --   --   --   --  2.6  HGB 9.4* 8.9* 8.5* 7.9* 8.1* 7.6*  HCT 31.2* 29.8* 28.2* 25.7* 26.5* 24.7*  MCV 83.9  --   --  83.7  --  83.4  PLT 43*  --   --  31*  --  33*     Signed:  Barton Dubois MD.  [facility] 08/24/2016, 11:16 PM

## 2016-08-28 ENCOUNTER — Telehealth: Payer: Self-pay

## 2016-08-28 NOTE — Telephone Encounter (Signed)
Home phone number listed has been disconnected, message left on mobile number

## 2016-08-28 NOTE — Telephone Encounter (Signed)
-----   Message from Jeoffrey Massed, RN sent at 08/21/2016  2:37 PM EDT ----- Regarding: Please call pt in one week Please call this pt of Dr. Eugenia Pancoast in one week. Ideally he would need repeat banding 2-3 weeks from today, but he left the hospital today AMA, so please call and ask him how he is doing in one week and whether he is willing to be scheduled for repeat EGD/ followed by our clinic. He has a lot of social stressors at the moment driving his decision to leave, so by then he may have changed his mind.   Thanks- Anderson Malta

## 2016-09-02 NOTE — Telephone Encounter (Signed)
Unable to reach pt letter mailed 

## 2016-09-13 ENCOUNTER — Ambulatory Visit: Payer: BLUE CROSS/BLUE SHIELD | Admitting: Gastroenterology

## 2016-09-20 ENCOUNTER — Telehealth: Payer: Self-pay | Admitting: Internal Medicine

## 2016-09-20 NOTE — Telephone Encounter (Signed)
Patient working in [place] In ED with leg swelling no ascites  Reviewed records w/MD agreed w/ diuresis Recommended he get seen by GI there and have repeat EGD and banding if needed

## 2016-09-26 NOTE — Telephone Encounter (Signed)
Thanks for taking this call Lawrence Brock I think this is your patient. I saw him for a variceal bleed with large varices when admitted in recently, s/p banding.  He left the hospital AMA, went to work in [place] against medical advice, not sure if he is coming back to this area or not, just so you are aware.

## 2018-01-21 ENCOUNTER — Encounter: Payer: Self-pay | Admitting: Gastroenterology

## 2019-08-13 DIAGNOSIS — Z8719 Personal history of other diseases of the digestive system: Secondary | ICD-10-CM | POA: Insufficient documentation

## 2021-05-30 DIAGNOSIS — R161 Splenomegaly, not elsewhere classified: Secondary | ICD-10-CM | POA: Diagnosis present

## 2022-02-01 DIAGNOSIS — F1021 Alcohol dependence, in remission: Secondary | ICD-10-CM | POA: Insufficient documentation

## 2022-05-10 ENCOUNTER — Inpatient Hospital Stay (HOSPITAL_COMMUNITY)
Admission: EM | Admit: 2022-05-10 | Discharge: 2022-05-13 | DRG: 432 | Disposition: A | Discharge status: Home / Self Care | Payer: BLUE CROSS/BLUE SHIELD | Attending: Student | Admitting: Student

## 2022-05-10 ENCOUNTER — Emergency Department (HOSPITAL_COMMUNITY): Payer: BLUE CROSS/BLUE SHIELD

## 2022-05-10 ENCOUNTER — Encounter (HOSPITAL_COMMUNITY): Payer: Self-pay | Admitting: Internal Medicine

## 2022-05-10 ENCOUNTER — Other Ambulatory Visit: Payer: Self-pay

## 2022-05-10 DIAGNOSIS — F1010 Alcohol abuse, uncomplicated: Secondary | ICD-10-CM | POA: Diagnosis present

## 2022-05-10 DIAGNOSIS — K921 Melena: Secondary | ICD-10-CM | POA: Diagnosis present

## 2022-05-10 DIAGNOSIS — E872 Acidosis, unspecified: Secondary | ICD-10-CM | POA: Diagnosis not present

## 2022-05-10 DIAGNOSIS — R55 Syncope and collapse: Secondary | ICD-10-CM | POA: Diagnosis present

## 2022-05-10 DIAGNOSIS — K2970 Gastritis, unspecified, without bleeding: Secondary | ICD-10-CM | POA: Diagnosis present

## 2022-05-10 DIAGNOSIS — I8501 Esophageal varices with bleeding: Secondary | ICD-10-CM | POA: Diagnosis present

## 2022-05-10 DIAGNOSIS — R079 Chest pain, unspecified: Secondary | ICD-10-CM | POA: Diagnosis present

## 2022-05-10 DIAGNOSIS — Z720 Tobacco use: Secondary | ICD-10-CM | POA: Diagnosis present

## 2022-05-10 DIAGNOSIS — I864 Gastric varices: Secondary | ICD-10-CM | POA: Diagnosis present

## 2022-05-10 DIAGNOSIS — K860 Alcohol-induced chronic pancreatitis: Secondary | ICD-10-CM | POA: Diagnosis present

## 2022-05-10 DIAGNOSIS — F1011 Alcohol abuse, in remission: Secondary | ICD-10-CM | POA: Diagnosis present

## 2022-05-10 DIAGNOSIS — I8510 Secondary esophageal varices without bleeding: Secondary | ICD-10-CM

## 2022-05-10 DIAGNOSIS — K920 Hematemesis: Secondary | ICD-10-CM

## 2022-05-10 DIAGNOSIS — I959 Hypotension, unspecified: Secondary | ICD-10-CM | POA: Insufficient documentation

## 2022-05-10 DIAGNOSIS — I10 Essential (primary) hypertension: Secondary | ICD-10-CM | POA: Diagnosis present

## 2022-05-10 DIAGNOSIS — D61818 Other pancytopenia: Secondary | ICD-10-CM | POA: Diagnosis present

## 2022-05-10 DIAGNOSIS — Z79899 Other long term (current) drug therapy: Secondary | ICD-10-CM

## 2022-05-10 DIAGNOSIS — K648 Other hemorrhoids: Secondary | ICD-10-CM | POA: Insufficient documentation

## 2022-05-10 DIAGNOSIS — I81 Portal vein thrombosis: Secondary | ICD-10-CM | POA: Diagnosis present

## 2022-05-10 DIAGNOSIS — R161 Splenomegaly, not elsewhere classified: Secondary | ICD-10-CM | POA: Diagnosis present

## 2022-05-10 DIAGNOSIS — K922 Gastrointestinal hemorrhage, unspecified: Secondary | ICD-10-CM | POA: Diagnosis present

## 2022-05-10 DIAGNOSIS — K7030 Alcoholic cirrhosis of liver without ascites: Principal | ICD-10-CM | POA: Diagnosis present

## 2022-05-10 DIAGNOSIS — R933 Abnormal findings on diagnostic imaging of other parts of digestive tract: Secondary | ICD-10-CM | POA: Diagnosis present

## 2022-05-10 DIAGNOSIS — S80211A Abrasion, right knee, initial encounter: Secondary | ICD-10-CM | POA: Diagnosis present

## 2022-05-10 DIAGNOSIS — G8929 Other chronic pain: Secondary | ICD-10-CM | POA: Diagnosis present

## 2022-05-10 DIAGNOSIS — E119 Type 2 diabetes mellitus without complications: Secondary | ICD-10-CM | POA: Diagnosis present

## 2022-05-10 DIAGNOSIS — D62 Acute posthemorrhagic anemia: Secondary | ICD-10-CM | POA: Diagnosis not present

## 2022-05-10 DIAGNOSIS — D696 Thrombocytopenia, unspecified: Secondary | ICD-10-CM | POA: Diagnosis present

## 2022-05-10 DIAGNOSIS — K760 Fatty (change of) liver, not elsewhere classified: Secondary | ICD-10-CM | POA: Diagnosis present

## 2022-05-10 DIAGNOSIS — J449 Chronic obstructive pulmonary disease, unspecified: Secondary | ICD-10-CM | POA: Diagnosis present

## 2022-05-10 DIAGNOSIS — F17210 Nicotine dependence, cigarettes, uncomplicated: Secondary | ICD-10-CM | POA: Diagnosis present

## 2022-05-10 DIAGNOSIS — D6959 Other secondary thrombocytopenia: Secondary | ICD-10-CM | POA: Diagnosis present

## 2022-05-10 DIAGNOSIS — K766 Portal hypertension: Secondary | ICD-10-CM | POA: Diagnosis present

## 2022-05-10 DIAGNOSIS — K259 Gastric ulcer, unspecified as acute or chronic, without hemorrhage or perforation: Secondary | ICD-10-CM | POA: Diagnosis present

## 2022-05-10 DIAGNOSIS — R1030 Lower abdominal pain, unspecified: Secondary | ICD-10-CM | POA: Diagnosis present

## 2022-05-10 DIAGNOSIS — K3189 Other diseases of stomach and duodenum: Secondary | ICD-10-CM

## 2022-05-10 DIAGNOSIS — Z8249 Family history of ischemic heart disease and other diseases of the circulatory system: Secondary | ICD-10-CM

## 2022-05-10 LAB — CBC
HCT: 30.3 % — ABNORMAL LOW (ref 39.0–52.0)
Hemoglobin: 10.3 g/dL — ABNORMAL LOW (ref 13.0–17.0)
MCH: 29.9 pg (ref 26.0–34.0)
MCHC: 34 g/dL (ref 30.0–36.0)
MCV: 88.1 fL (ref 80.0–100.0)
Platelets: 36 10*3/uL — ABNORMAL LOW (ref 150–400)
RBC: 3.44 MIL/uL — ABNORMAL LOW (ref 4.22–5.81)
RDW: 14.9 % (ref 11.5–15.5)
WBC: 6.3 10*3/uL (ref 4.0–10.5)
nRBC: 0 % (ref 0.0–0.2)

## 2022-05-10 LAB — TROPONIN I (HIGH SENSITIVITY)
Troponin I (High Sensitivity): 2 ng/L (ref <18)
Troponin I (High Sensitivity): 3 ng/L (ref <18)

## 2022-05-10 LAB — URINALYSIS, W/ REFLEX TO CULTURE (INFECTION SUSPECTED)
Bacteria, UA: NONE SEEN
Bilirubin Urine: NEGATIVE
Glucose, UA: NEGATIVE mg/dL
Hgb urine dipstick: NEGATIVE
Ketones, ur: NEGATIVE mg/dL
Leukocytes,Ua: NEGATIVE
Nitrite: NEGATIVE
Protein, ur: NEGATIVE mg/dL
Specific Gravity, Urine: >1.046 — ABNORMAL HIGH (ref 1.005–1.030)
pH: 5 (ref 5.0–8.0)

## 2022-05-10 LAB — I-STAT CHEM 8, ED
BUN: 13 mg/dL (ref 6–20)
Calcium, Ion: 1.16 mmol/L (ref 1.15–1.40)
Chloride: 107 mmol/L (ref 98–111)
Creatinine, Ser: 1 mg/dL (ref 0.61–1.24)
Glucose, Bld: 115 mg/dL — ABNORMAL HIGH (ref 70–99)
HCT: 35 % — ABNORMAL LOW (ref 39.0–52.0)
Hemoglobin: 11.9 g/dL — ABNORMAL LOW (ref 13.0–17.0)
Potassium: 4.9 mmol/L (ref 3.5–5.1)
Sodium: 140 mmol/L (ref 135–145)
TCO2: 24 mmol/L (ref 22–32)

## 2022-05-10 LAB — CBC WITH DIFFERENTIAL/PLATELET
Abs Immature Granulocytes: 0.19 10*3/uL — ABNORMAL HIGH (ref 0.00–0.07)
Basophils Absolute: 0 10*3/uL (ref 0.0–0.1)
Basophils Relative: 0 %
Eosinophils Absolute: 0.3 10*3/uL (ref 0.0–0.5)
Eosinophils Relative: 3 %
HCT: 34.3 % — ABNORMAL LOW (ref 39.0–52.0)
Hemoglobin: 11.4 g/dL — ABNORMAL LOW (ref 13.0–17.0)
Immature Granulocytes: 2 %
Lymphocytes Relative: 13 %
Lymphs Abs: 1.2 10*3/uL (ref 0.7–4.0)
MCH: 29.6 pg (ref 26.0–34.0)
MCHC: 33.2 g/dL (ref 30.0–36.0)
MCV: 89.1 fL (ref 80.0–100.0)
Monocytes Absolute: 0.4 10*3/uL (ref 0.1–1.0)
Monocytes Relative: 5 %
Neutro Abs: 7.2 10*3/uL (ref 1.7–7.7)
Neutrophils Relative %: 77 %
Platelets: 94 10*3/uL — ABNORMAL LOW (ref 150–400)
RBC: 3.85 MIL/uL — ABNORMAL LOW (ref 4.22–5.81)
RDW: 15.3 % (ref 11.5–15.5)
WBC: 9.4 10*3/uL (ref 4.0–10.5)
nRBC: 0 % (ref 0.0–0.2)

## 2022-05-10 LAB — COMPREHENSIVE METABOLIC PANEL
ALT: 30 U/L (ref 0–44)
AST: 27 U/L (ref 15–41)
Albumin: 3 g/dL — ABNORMAL LOW (ref 3.5–5.0)
Alkaline Phosphatase: 90 U/L (ref 38–126)
Anion gap: 7 (ref 5–15)
BUN: 12 mg/dL (ref 6–20)
CO2: 24 mmol/L (ref 22–32)
Calcium: 8.2 mg/dL — ABNORMAL LOW (ref 8.9–10.3)
Chloride: 108 mmol/L (ref 98–111)
Creatinine, Ser: 1.09 mg/dL (ref 0.61–1.24)
GFR, Estimated: >60 mL/min (ref >60)
Glucose, Bld: 118 mg/dL — ABNORMAL HIGH (ref 70–99)
Potassium: 4.8 mmol/L (ref 3.5–5.1)
Sodium: 139 mmol/L (ref 135–145)
Total Bilirubin: 0.8 mg/dL (ref 0.3–1.2)
Total Protein: 5.4 g/dL — ABNORMAL LOW (ref 6.5–8.1)

## 2022-05-10 LAB — APTT: aPTT: 28 seconds (ref 24–36)

## 2022-05-10 LAB — PROTIME-INR
INR: 1.2 (ref 0.8–1.2)
Prothrombin Time: 15.5 seconds — ABNORMAL HIGH (ref 11.4–15.2)

## 2022-05-10 LAB — C-REACTIVE PROTEIN: CRP: <0.5 mg/dL (ref <1.0)

## 2022-05-10 LAB — BLOOD PRODUCT ORDER (VERBAL) VERIFICATION

## 2022-05-10 LAB — LACTIC ACID, PLASMA
Lactic Acid, Venous: 0.9 mmol/L (ref 0.5–1.9)
Lactic Acid, Venous: 3.2 mmol/L (ref 0.5–1.9)
Lactic Acid, Venous: 0.9 mmol/L (ref 0.5–1.9)
Lactic Acid, Venous: 1.6 mmol/L (ref 0.5–1.9)
Lactic Acid, Venous: 1 mmol/L (ref 0.5–1.9)

## 2022-05-10 LAB — POC OCCULT BLOOD, ED: Fecal Occult Bld: NEGATIVE

## 2022-05-10 LAB — HEMOGLOBIN AND HEMATOCRIT, BLOOD
HCT: 27.7 % — ABNORMAL LOW (ref 39.0–52.0)
HCT: 30.5 % — ABNORMAL LOW (ref 39.0–52.0)
Hemoglobin: 9.1 g/dL — ABNORMAL LOW (ref 13.0–17.0)
Hemoglobin: 10 g/dL — ABNORMAL LOW (ref 13.0–17.0)

## 2022-05-10 LAB — PREPARE RBC (CROSSMATCH)

## 2022-05-10 LAB — HIV ANTIBODY (ROUTINE TESTING W REFLEX): HIV Screen 4th Generation wRfx: NONREACTIVE

## 2022-05-10 LAB — AMMONIA: Ammonia: 17 umol/L (ref 9–35)

## 2022-05-10 SURGERY — SIGMOIDOSCOPY, FLEXIBLE
Anesthesia: Monitor Anesthesia Care

## 2022-05-10 MED ORDER — PANTOPRAZOLE INFUSION (NEW) - SIMPLE MED
8.0000 mg/h | INTRAVENOUS | Status: DC
  Administered 2022-05-10 – 2022-05-11 (×3): 8 mg/h via INTRAVENOUS
  Filled 2022-05-10 (×4): qty 100

## 2022-05-10 MED ORDER — SODIUM CHLORIDE 0.9 % IV BOLUS
1000.0000 mL | Freq: Once | INTRAVENOUS | Status: AC
  Administered 2022-05-10: 1000 mL via INTRAVENOUS

## 2022-05-10 MED ORDER — OXYCODONE HCL 5 MG PO TABS
5.0000 mg | ORAL_TABLET | Freq: Three times a day (TID) | ORAL | Status: DC | PRN
  Administered 2022-05-11 – 2022-05-12 (×4): 5 mg via ORAL
  Filled 2022-05-10 (×4): qty 1

## 2022-05-10 MED ORDER — FENTANYL CITRATE PF 50 MCG/ML IJ SOSY
25.0000 ug | PREFILLED_SYRINGE | Freq: Once | INTRAMUSCULAR | Status: AC
  Administered 2022-05-10: 25 ug via INTRAVENOUS
  Filled 2022-05-10: qty 1

## 2022-05-10 MED ORDER — ACETAMINOPHEN 325 MG PO TABS: 650.0000 mg | ORAL_TABLET | Freq: Four times a day (QID) | ORAL | Status: DC | PRN

## 2022-05-10 MED ORDER — SODIUM CHLORIDE 0.9 % IV SOLN: INTRAVENOUS | Status: DC

## 2022-05-10 MED ORDER — IOHEXOL 350 MG/ML SOLN
100.0000 mL | Freq: Once | INTRAVENOUS | Status: AC | PRN
  Administered 2022-05-10: 100 mL via INTRAVENOUS

## 2022-05-10 MED ORDER — ACETAMINOPHEN 650 MG RE SUPP: 650.0000 mg | Freq: Four times a day (QID) | RECTAL | Status: DC | PRN

## 2022-05-10 MED ORDER — SODIUM CHLORIDE 0.9% FLUSH
3.0000 mL | Freq: Two times a day (BID) | INTRAVENOUS | Status: DC
  Administered 2022-05-10 – 2022-05-12 (×5): 3 mL via INTRAVENOUS

## 2022-05-10 MED ORDER — SODIUM CHLORIDE 0.9 % IV SOLN
50.0000 ug/h | INTRAVENOUS | Status: DC
  Administered 2022-05-10 – 2022-05-11 (×3): 50 ug/h via INTRAVENOUS
  Filled 2022-05-10 (×6): qty 1

## 2022-05-10 MED ORDER — ALBUTEROL SULFATE (2.5 MG/3ML) 0.083% IN NEBU: 2.5000 mg | INHALATION_SOLUTION | Freq: Four times a day (QID) | RESPIRATORY_TRACT | Status: DC | PRN

## 2022-05-10 MED ORDER — OCTREOTIDE LOAD VIA INFUSION
50.0000 ug | Freq: Once | INTRAVENOUS | Status: AC
  Administered 2022-05-10: 50 ug via INTRAVENOUS
  Filled 2022-05-10: qty 25

## 2022-05-10 MED ORDER — MORPHINE SULFATE (PF) 2 MG/ML IV SOLN
2.0000 mg | Freq: Once | INTRAVENOUS | Status: AC
  Administered 2022-05-10: 2 mg via INTRAVENOUS
  Filled 2022-05-10: qty 1

## 2022-05-10 MED ORDER — ORAL CARE MOUTH RINSE: 15.0000 mL | OROMUCOSAL | Status: DC | PRN

## 2022-05-10 MED ORDER — CEFTRIAXONE SODIUM 250 MG IJ SOLR: 2.0000 mg | Freq: Once | INTRAMUSCULAR | Status: DC

## 2022-05-10 MED ORDER — OCTREOTIDE LOAD VIA INFUSION: 50.0000 ug | Freq: Once | INTRAVENOUS | Status: DC

## 2022-05-10 MED ORDER — NICOTINE 21 MG/24HR TD PT24
21.0000 mg | MEDICATED_PATCH | Freq: Every day | TRANSDERMAL | Status: DC
  Administered 2022-05-10 – 2022-05-12 (×3): 21 mg via TRANSDERMAL
  Filled 2022-05-10 (×3): qty 1

## 2022-05-10 MED ORDER — SODIUM CHLORIDE 0.9 % IV SOLN
2.0000 g | INTRAVENOUS | Status: DC
  Administered 2022-05-11 – 2022-05-12 (×2): 2 g via INTRAVENOUS
  Filled 2022-05-10 (×3): qty 20

## 2022-05-10 MED ORDER — SODIUM CHLORIDE 0.9 % IV SOLN
2.0000 g | Freq: Once | INTRAVENOUS | Status: AC
  Administered 2022-05-10: 2 g via INTRAVENOUS
  Filled 2022-05-10: qty 20

## 2022-05-10 MED ORDER — SODIUM CHLORIDE 0.9% IV SOLUTION: Freq: Once | INTRAVENOUS | Status: DC

## 2022-05-10 MED ORDER — TRIMETHOBENZAMIDE HCL 100 MG/ML IM SOLN: 200.0000 mg | Freq: Four times a day (QID) | INTRAMUSCULAR | Status: DC | PRN

## 2022-05-10 MED ORDER — DICYCLOMINE HCL 20 MG PO TABS
20.0000 mg | ORAL_TABLET | Freq: Three times a day (TID) | ORAL | Status: DC
  Administered 2022-05-11 – 2022-05-12 (×5): 20 mg via ORAL
  Filled 2022-05-10 (×10): qty 1

## 2022-05-10 MED ORDER — MORPHINE SULFATE (PF) 2 MG/ML IV SOLN
2.0000 mg | INTRAVENOUS | Status: AC | PRN
  Administered 2022-05-10 – 2022-05-11 (×3): 2 mg via INTRAVENOUS
  Filled 2022-05-10 (×3): qty 1

## 2022-05-10 MED ORDER — SODIUM CHLORIDE 0.9% IV SOLUTION: Freq: Once | INTRAVENOUS | Status: AC

## 2022-05-10 MED ORDER — PANTOPRAZOLE 80MG IVPB - SIMPLE MED
80.0000 mg | Freq: Once | INTRAVENOUS | Status: AC
  Administered 2022-05-10: 80 mg via INTRAVENOUS
  Filled 2022-05-10: qty 100

## 2022-05-10 MED ORDER — METRONIDAZOLE 500 MG/100ML IV SOLN
500.0000 mg | Freq: Two times a day (BID) | INTRAVENOUS | Status: DC
  Administered 2022-05-10 – 2022-05-12 (×6): 500 mg via INTRAVENOUS
  Filled 2022-05-10 (×6): qty 100

## 2022-05-10 NOTE — Anesthesia Preprocedure Evaluation (Addendum)
Anesthesia Evaluation  Patient identified by MRN, date of birth, ID band Patient awake    Reviewed: Allergy & Precautions, H&P , NPO status , Patient's Chart, lab work & pertinent test results  Airway Mallampati: II  TM Distance: >3 FB Neck ROM: Full    Dental  (+) Dental Advisory Given, Teeth Intact   Pulmonary pneumonia, Current Smoker and Patient abstained from smoking.   Pulmonary exam normal breath sounds clear to auscultation       Cardiovascular hypertension, Normal cardiovascular exam Rhythm:Regular Rate:Normal     Neuro/Psych negative neurological ROS  negative psych ROS   GI/Hepatic PUD,,,(+) Cirrhosis   Esophageal Varices  substance abuse  alcohol use  Endo/Other  negative endocrine ROS    Renal/GU negative Renal ROS     Musculoskeletal   Abdominal   Peds  Hematology negative hematology ROS (+) Blood dyscrasia, anemia   Anesthesia Other Findings   Reproductive/Obstetrics                             Anesthesia Physical Anesthesia Plan  ASA: 3  Anesthesia Plan: MAC   Post-op Pain Management: Minimal or no pain anticipated   Induction:   PONV Risk Score and Plan: 0 and Ondansetron, Propofol infusion, TIVA and Treatment may vary due to age or medical condition  Airway Management Planned: Natural Airway  Additional Equipment:   Intra-op Plan:   Post-operative Plan:   Informed Consent: I have reviewed the patients History and Physical, chart, labs and discussed the procedure including the risks, benefits and alternatives for the proposed anesthesia with the patient or authorized representative who has indicated his/her understanding and acceptance.     Dental advisory given  Plan Discussed with: CRNA  Anesthesia Plan Comments:         Anesthesia Quick Evaluation
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
## 2022-05-10 NOTE — ED Notes (Signed)
1st unit of blood was completed by nights.

## 2022-05-10 NOTE — H&P (Signed)
History and Physical    PatientZenas Brock [id number] DOB: 1971/03/06 DOA: 05/10/2022 DOS: the patient was seen and examined on 05/10/2022 PCP: [name], MD  Patient coming from: Home  Chief Complaint:  Chief Complaint  Patient presents with   Fall   HPI: Lawrence Brock is a 52 y.o. male with medical history significant of hypertension, diabetes, COPD, alcoholic liver cirrhosis with esophageal varices, chronic pancreatitis, portal vein thrombosis in 2016 not on anticoagulation, pancreatic mass, thrombocytopenia, tobacco abuse, and alcohol abuse in remission who presents after passing out at home.  He had gotten up to use the restroom around 3 AM this morning and when he was coming back felt lightheaded prior to passing out.  He fell hitting a shoe rack and hit his head on the carpet.  After getting back up patient reported feeling nauseous prior to vomiting 3 times.  Emesis was noted to be bright red blood with black particles present.  Patient notes that he is also been having pains in the left lower quadrant of his abdomen that he describes as pressure, but is also stabbing at other times and radiates across to the middle of his abdomen.  Having a bowel movement seems to help relieve symptoms temporarily.   Patient notes that he had intermittently been having dark stool since November 2023.  Most recent occurrence was 5 days ago where he noticed dark stools with a dark red blood clot present.  At that time he had gone to [facility] where they checked his stools and noted they were positive for blood.  Hemoglobin was noted to be around 13.9 and a CT scan of his abdomen and pelvis noted concern for possible colitis versus passive congestion stable changes of chronic pancreatitis..  Patient stated that they discharged him, but wife states that he left against advice.  Stools since that time had not been bloody.  He is not on any NSAIDs and usually takes Tylenol for pain.  Review  of his medication list includes ketorolac 10 mg tablets every 6 hours as needed for pain, but this was given to him [facility] ED visit on 2/11.  He has not used alcohol in over 9 years, but does still continue to smoke 1.5 packs cigarettes per day on average.  Patient also reports associated symptoms of some left-sided chest pain on started after getting here to the hospital.  Furthermore, he had not been taking any medications  In the emergency department patient was noted to be afebrile with respirations 14-29, blood pressures as low as 78/67 -121/75, and all other vital signs maintained.  Labs significant for hemoglobin 11.4, platelets 94, calcium 8.2, albumin 3, and lactic acid 0.9->3.2.  Chest x-ray showed no acute abnormality.  CT scan of the head and cervical spine did not note any acute abnormality.  [facility] had been consulted.  Patient was given 1 L of normal saline IV fluids, IV pain medications, Rocephin, octreotide drip, and Protonix drip.  He also had received 1 unit of emergency release blood upon initial arrival due to concern for hemorrhagic shock.  Review of Systems: As mentioned in the history of present illness. All other systems reviewed and are negative. Past Medical History:  Diagnosis Date   Abdominal pain    intermittent abdominal pain in lower abdomen   Anemia    Cholangitis 01/2014   with liver abscess   Chronic back pain    Chronic calcific pancreatitis ([place]) 03/2005   with  dilated pancreatic duct. pseudocyst in 08/2013. Follwed by Dr Owens Loffler and Birdie Sons (at [facility]).     Chronic pancreatitis ([place]) 09/10/2013   Fatty liver    cirrhosis of liver never confirmed.    Hypertension    No meds yet.   Liver abscess 01/2014   Pneumonia    10-30-14 [facility] hospital admission, no problems now.   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    Thrombocytopenia ([place]) 2015   with splenomegaly 03/2014.  s/p bone marrow biopsy, nonspecific  findings 09/2015   Past Surgical History:  Procedure Laterality Date   APPENDECTOMY     COLONOSCOPY WITH PROPOFOL N/A 01/05/2015   Milus Banister, MD;  Thre 7 to 13 mm tubular adenomatous polyps removed. repeat colonoscopy suggested in 12/2017.     ENDOSCOPIC RETROGRADE CHOLANGIOPANCREATOGRAPHY (ERCP) WITH PROPOFOL N/A 06/02/2014   Dr Owens Loffler.  removed covered metal stent.  no stricture noted.  Rec: biliary diversion surgery if recurrent stricture arises.   ERCP N/A 02/22/2014   Ladene Artist, MD; with removal of plastic stent, replaced with metal biliary stent.  filling defect: stone, sludge, blood clots removed with baloon sweep.     ERCP N/A 02/18/2014   For suspected cholangitis. Inda Castle, MD; s/p sphincterotomy, blood clots and sludge swept out, remnant of pancreatic duct noted (removal not mentioned.  plastic stent placed into CBD.     ERCP, plastic pancreatic duct stent placement.  2007   Dr Sanjuana Letters at [facility].    ESOPHAGOGASTRODUODENOSCOPY (EGD) WITH PROPOFOL N/A 09/14/2013   Dr Hilarie Fredrickson.  erosive gastritis.  bulbar ulcer.  removal of plastic biliaty stent.     ESOPHAGOGASTRODUODENOSCOPY (EGD) WITH PROPOFOL N/A 01/05/2015   for melena. Milus Banister.  Large distal esophageal varices, no bleeding stigmata.  no gastric varices, not banded.  Moderate to severe portal hypertensive gastropathy.  Nadolol initiated.    ESOPHAGOGASTRODUODENOSCOPY (EGD) WITH PROPOFOL N/A 08/19/2016   Armbruster, Renelda Loma, MD; Large esophageal varices with red whale signs, high risk for bleeding. 11 bands placed.  Portal hypertensive gastropathy.    GASTROINTESTINAL STENT REMOVAL N/A 09/14/2013   Jerene Bears, MD; Pancreatic stent removal   Social History:  reports that he has been smoking cigarettes. He has been smoking an average of .5 packs per day. He has never used smokeless tobacco. He reports that he does not drink alcohol and does not use drugs.  No Known Allergies  Family  History  Problem Relation Age of Onset   Hypertension Mother     Prior to Admission medications   Medication Sig Start Date End Date Taking? Authorizing Provider  dicyclomine (BENTYL) 20 MG tablet Take 20 mg by mouth in the morning and at bedtime.   Yes [provider]  HYDROcodone-acetaminophen (NORCO) 7.5-325 MG tablet Take 1 tablet by mouth every 6 (six) hours as needed for moderate pain.   Yes [provider]  ketorolac (TORADOL) 10 MG tablet Take 10 mg by mouth every 6 (six) hours as needed for moderate pain.   Yes [provider]  oxycodone (OXY-IR) 5 MG capsule Take 1 capsule (5 mg total) by mouth every 4 (four) hours as needed. Patient taking differently: Take 5 mg by mouth every 8 (eight) hours as needed for pain. [id number]  Yes Delora Fuel, MD    Physical Exam: Vitals:   05/10/22 0530 05/10/22 0600 05/10/22 0630 05/10/22 0645  BP: 103/74 121/75 96/69 (!) 85/67  Pulse: 72  74 68 68  Resp: (!) 22 14 (!) 24 16  Temp:      TempSrc:      SpO2: 100% 100% 100% 100%    Constitutional: Middle Aged male who appears to be in no acute distress. Eyes: PERRL, lids and conjunctivae normal ENMT: Mucous membranes are moist.   Neck: normal, supple  Respiratory: clear to auscultation bilaterally, no wheezing, no crackles. Normal respiratory effort. No accessory muscle use.  Cardiovascular: Regular rate and rhythm, no murmurs / rubs / gallops. No extremity edema. 2+ pedal pulses. No carotid bruits.  Abdomen: tenderness to palpation of the left lower quadrant of the abdomen.  Bowel sounds present all 4 quadrants. Musculoskeletal: no clubbing / cyanosis. No joint deformity upper and lower extremities. Good ROM, no contractures. Normal muscle tone.  Skin: Abrasion of the right knee. Neurologic: CN 2-12 grossly intact. Strength 5/5 in all 4.  Psychiatric: Normal judgment and insight. Alert and oriented x 3.  Agitated mood.   Data Reviewed:  EKG reveals sinus rhythm  at 68 bpm with nonspecific ST wave changes Assessment and Plan:  Upper GI bleed Acute blood loss anemia Patient presents after having 3 episodes of vomiting up blood this morning.  Reports having dark black stool with dark red blood clot present in it 4 days ago. Hemoglobin noted to be 11.4->10.3, but had been 13.9 just 4 days ago when checked at [facility].  Patient has prior history of esophageal varices.  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  Patient has been placed on Protonix  and octreotide drip.  He had been given 1 L of normal saline IV fluids, Rocephin IV, and transfused 1 unit of PRBCs. -Admit to a progressive bed -Continue Protonix drip and -Continue with transfusion of PRBCs -Serial monitoring of H&H -[facility] consulted, will follow-up for any further recommendations  Lactic acidosis Acute.  Initial lactic acid was 0.9, but recheck 3.2.  Possibly related to patient's drop in blood pressure. -Continue to trend lactic acid levels  Syncope and collapse Acute.  Prior to arrival.  He is not on any blood pressure medications after stopping them.Thought secondary to acute blood loss.  -Follow-up telemetry  Chest pain Patient reported having left-sided chest pain while in the ED.  Initial EKG noted some ST wave changes, but on recheck or resolved. -Check high-sensitivity troponins  Lower abdominal pain   Acute.  Patient reports having left lower abdominal pain.  Previously, CT from 2/11 noted concern for mild circumferential bowel wall thickening involving the colon concerning for infectious or inflammatory colitis versus passive congestion related to mesenteric venous hypertension and pancreatitis. -Check CRP -Continue Rocephin IV and added on metronidazole  Thrombocytopenia Chronic.  Platelet count 94-> 36 which  appears similar to this he needs.  Secondary to history of liver cirrhosis. -Orders placed to transfuse 1 pack of platelets prior to EGD -Continue to monitor  Alcoholic liver cirrhosis Patient with history of cirrhosis likely secondary to alcohol abuse.  LFTs currently within normal limits with INR 1.2.  Alcohol abuse in remission Patient reports that he stopped drinking alcohol approximately 9 years ago. -Encouraged continued cessation   Tobacco abuse Patient reports smoking 1.5 packs of cigarettes per day on average.  Patient reported trying nicotine patch and hypnosis before without it helping. -Counseled on need of cessation  -Nicotine patch  DVT prophylaxis:  SCDs Advance Care Planning:   Code Status: Full Code    Consults: GI  Family Communication: Wife updated at bedside  Severity of Illness: The appropriate patient status for this patient is OBSERVATION. Observation status is judged to be reasonable and necessary in order to provide the required intensity of service to ensure the patient's safety. The patient's presenting symptoms, physical exam findings, and initial radiographic and laboratory data in the context of their medical condition is felt to place them at decreased risk for further clinical deterioration. Furthermore, it is anticipated that the patient will be medically stable for discharge from the hospital within 2 midnights of admission.   Author: Norval Morton, MD 05/10/2022 7:33 AM  For on call review [email or web address].

## 2022-05-10 NOTE — ED Triage Notes (Signed)
Pt arrived from home by [facility], pt stood got dizzy and fell. Pt reports he did hit his head and +LOC. Pt vomited 245m bright red blood with ems. Pt was recently discharged from hospital w/ GI bleed.
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
## 2022-05-10 NOTE — ED Notes (Addendum)
This nurse entered the room, Lawrence Lose, RN had initiated pts care. Per Vanice Sarah, PA  1 unit emergency blood administered once IV access obtained. Lawrence Lose RN placed 20G in pts R forearm and started the unit at 0406. This RN took over pts care and remained at the bedside unit the unit was completed.

## 2022-05-10 NOTE — H&P (View-Only) (Signed)
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  This is most likely from portal hypertension, but given his symptoms and a normal appearing left colon in November, will plan for a flexible sigmoidoscopy to exclude colitis.  The details, risks (including bleeding, perforation, infection, missed lesions, medication reactions and possible hospitalization or surgery if complications occur), benefits, and alternatives to EGD/flex sig with possible biopsy and possible band ligation/hemostasis were discussed with the patient and he consents to proceed.    Kollin Udell E. Candis Schatz, MD [facility]

## 2022-05-10 NOTE — ED Notes (Signed)
Will message MD so I can release the platelets

## 2022-05-10 NOTE — Consult Note (Addendum)
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 from portal hypertension, but given his symptoms and a normal appearing left colon in November, will plan for a flexible sigmoidoscopy to exclude colitis.  The details, risks (including bleeding, perforation, infection, missed lesions, medication reactions and possible hospitalization or surgery if complications occur), benefits, and alternatives to EGD/flex sig with possible biopsy and possible band ligation/hemostasis were discussed with the patient and he consents to proceed.    Eleonore Shippee E. Candis Schatz, MD [facility] Gastroenterology

## 2022-05-11 ENCOUNTER — Encounter (HOSPITAL_COMMUNITY)
Admission: EM | Disposition: A | Discharge status: Home / Self Care | Payer: Self-pay | Source: Home / Self Care | Attending: Student

## 2022-05-11 ENCOUNTER — Encounter (HOSPITAL_COMMUNITY): Payer: Self-pay | Admitting: Internal Medicine

## 2022-05-11 ENCOUNTER — Observation Stay (HOSPITAL_COMMUNITY): Payer: BLUE CROSS/BLUE SHIELD | Admitting: Anesthesiology

## 2022-05-11 DIAGNOSIS — K922 Gastrointestinal hemorrhage, unspecified: Secondary | ICD-10-CM | POA: Diagnosis not present

## 2022-05-11 DIAGNOSIS — K860 Alcohol-induced chronic pancreatitis: Secondary | ICD-10-CM

## 2022-05-11 DIAGNOSIS — I8511 Secondary esophageal varices with bleeding: Secondary | ICD-10-CM | POA: Diagnosis not present

## 2022-05-11 DIAGNOSIS — K2970 Gastritis, unspecified, without bleeding: Secondary | ICD-10-CM | POA: Diagnosis present

## 2022-05-11 DIAGNOSIS — K648 Other hemorrhoids: Secondary | ICD-10-CM

## 2022-05-11 DIAGNOSIS — F1010 Alcohol abuse, uncomplicated: Secondary | ICD-10-CM | POA: Diagnosis present

## 2022-05-11 DIAGNOSIS — K920 Hematemesis: Secondary | ICD-10-CM | POA: Diagnosis present

## 2022-05-11 DIAGNOSIS — E872 Acidosis, unspecified: Secondary | ICD-10-CM | POA: Diagnosis not present

## 2022-05-11 DIAGNOSIS — D62 Acute posthemorrhagic anemia: Secondary | ICD-10-CM | POA: Diagnosis not present

## 2022-05-11 DIAGNOSIS — F1011 Alcohol abuse, in remission: Secondary | ICD-10-CM | POA: Diagnosis present

## 2022-05-11 DIAGNOSIS — I959 Hypotension, unspecified: Secondary | ICD-10-CM | POA: Diagnosis present

## 2022-05-11 DIAGNOSIS — I8510 Secondary esophageal varices without bleeding: Secondary | ICD-10-CM | POA: Diagnosis not present

## 2022-05-11 DIAGNOSIS — D61818 Other pancytopenia: Secondary | ICD-10-CM

## 2022-05-11 DIAGNOSIS — K766 Portal hypertension: Secondary | ICD-10-CM | POA: Diagnosis present

## 2022-05-11 DIAGNOSIS — K760 Fatty (change of) liver, not elsewhere classified: Secondary | ICD-10-CM | POA: Diagnosis present

## 2022-05-11 DIAGNOSIS — K259 Gastric ulcer, unspecified as acute or chronic, without hemorrhage or perforation: Secondary | ICD-10-CM | POA: Diagnosis present

## 2022-05-11 DIAGNOSIS — S80211A Abrasion, right knee, initial encounter: Secondary | ICD-10-CM | POA: Diagnosis present

## 2022-05-11 DIAGNOSIS — D6959 Other secondary thrombocytopenia: Secondary | ICD-10-CM | POA: Diagnosis present

## 2022-05-11 DIAGNOSIS — F17210 Nicotine dependence, cigarettes, uncomplicated: Secondary | ICD-10-CM | POA: Diagnosis present

## 2022-05-11 DIAGNOSIS — R1030 Lower abdominal pain, unspecified: Secondary | ICD-10-CM | POA: Diagnosis not present

## 2022-05-11 DIAGNOSIS — I8501 Esophageal varices with bleeding: Secondary | ICD-10-CM | POA: Diagnosis present

## 2022-05-11 DIAGNOSIS — J449 Chronic obstructive pulmonary disease, unspecified: Secondary | ICD-10-CM | POA: Diagnosis present

## 2022-05-11 DIAGNOSIS — R161 Splenomegaly, not elsewhere classified: Secondary | ICD-10-CM

## 2022-05-11 DIAGNOSIS — I81 Portal vein thrombosis: Secondary | ICD-10-CM | POA: Diagnosis present

## 2022-05-11 DIAGNOSIS — E119 Type 2 diabetes mellitus without complications: Secondary | ICD-10-CM | POA: Diagnosis present

## 2022-05-11 DIAGNOSIS — G8929 Other chronic pain: Secondary | ICD-10-CM | POA: Diagnosis present

## 2022-05-11 DIAGNOSIS — K7030 Alcoholic cirrhosis of liver without ascites: Secondary | ICD-10-CM | POA: Diagnosis present

## 2022-05-11 DIAGNOSIS — K3189 Other diseases of stomach and duodenum: Secondary | ICD-10-CM | POA: Diagnosis present

## 2022-05-11 DIAGNOSIS — I10 Essential (primary) hypertension: Secondary | ICD-10-CM | POA: Diagnosis present

## 2022-05-11 DIAGNOSIS — R079 Chest pain, unspecified: Secondary | ICD-10-CM | POA: Diagnosis not present

## 2022-05-11 DIAGNOSIS — D696 Thrombocytopenia, unspecified: Secondary | ICD-10-CM | POA: Diagnosis not present

## 2022-05-11 DIAGNOSIS — I864 Gastric varices: Secondary | ICD-10-CM | POA: Diagnosis present

## 2022-05-11 HISTORY — PX: ESOPHAGEAL BANDING: SHX5518

## 2022-05-11 HISTORY — PX: FLEXIBLE SIGMOIDOSCOPY: SHX5431

## 2022-05-11 HISTORY — PX: ESOPHAGOGASTRODUODENOSCOPY: SHX5428

## 2022-05-11 HISTORY — PX: BIOPSY: SHX5522

## 2022-05-11 LAB — CBC
HCT: 28.3 % — ABNORMAL LOW (ref 39.0–52.0)
HCT: 29 % — ABNORMAL LOW (ref 39.0–52.0)
Hemoglobin: 9.6 g/dL — ABNORMAL LOW (ref 13.0–17.0)
Hemoglobin: 9.9 g/dL — ABNORMAL LOW (ref 13.0–17.0)
MCH: 29.5 pg (ref 26.0–34.0)
MCH: 29.6 pg (ref 26.0–34.0)
MCHC: 33.9 g/dL (ref 30.0–36.0)
MCHC: 34.1 g/dL (ref 30.0–36.0)
MCV: 87.1 fL (ref 80.0–100.0)
MCV: 86.6 fL (ref 80.0–100.0)
Platelets: 37 10*3/uL — ABNORMAL LOW (ref 150–400)
Platelets: 39 10*3/uL — ABNORMAL LOW (ref 150–400)
RBC: 3.25 MIL/uL — ABNORMAL LOW (ref 4.22–5.81)
RBC: 3.35 MIL/uL — ABNORMAL LOW (ref 4.22–5.81)
RDW: 15.6 % — ABNORMAL HIGH (ref 11.5–15.5)
RDW: 15.3 % (ref 11.5–15.5)
WBC: 2.5 10*3/uL — ABNORMAL LOW (ref 4.0–10.5)
WBC: 2.3 10*3/uL — ABNORMAL LOW (ref 4.0–10.5)
nRBC: 0 % (ref 0.0–0.2)
nRBC: 0 % (ref 0.0–0.2)

## 2022-05-11 LAB — PREPARE PLATELET PHERESIS: Unit division: 0

## 2022-05-11 LAB — TYPE AND SCREEN
ABO/RH(D): A POS
Antibody Screen: NEGATIVE
Unit division: 0
Unit division: 0

## 2022-05-11 LAB — BASIC METABOLIC PANEL
Anion gap: 7 (ref 5–15)
BUN: 10 mg/dL (ref 6–20)
CO2: 22 mmol/L (ref 22–32)
Calcium: 7.8 mg/dL — ABNORMAL LOW (ref 8.9–10.3)
Chloride: 110 mmol/L (ref 98–111)
Creatinine, Ser: 0.91 mg/dL (ref 0.61–1.24)
GFR, Estimated: >60 mL/min (ref >60)
Glucose, Bld: 71 mg/dL (ref 70–99)
Potassium: 3.7 mmol/L (ref 3.5–5.1)
Sodium: 139 mmol/L (ref 135–145)

## 2022-05-11 LAB — BPAM RBC
Blood Product Expiration Date: 202403042359
Blood Product Expiration Date: 202403122359
ISSUE DATE / TIME: 202402160357
ISSUE DATE / TIME: 202402161031
Unit Type and Rh: 5100
Unit Type and Rh: 6200

## 2022-05-11 LAB — BPAM PLATELET PHERESIS
Blood Product Expiration Date: 202402172359
ISSUE DATE / TIME: 202402161136
Unit Type and Rh: 6200

## 2022-05-11 SURGERY — EGD (ESOPHAGOGASTRODUODENOSCOPY)
Anesthesia: Monitor Anesthesia Care

## 2022-05-11 MED ORDER — SODIUM CHLORIDE 0.9 % IV SOLN: INTRAVENOUS | Status: DC

## 2022-05-11 MED ORDER — PHENYLEPHRINE 80 MCG/ML (10ML) SYRINGE FOR IV PUSH (FOR BLOOD PRESSURE SUPPORT)
PREFILLED_SYRINGE | INTRAVENOUS | Status: DC | PRN
  Administered 2022-05-11 (×3): 80 ug via INTRAVENOUS

## 2022-05-11 MED ORDER — SIMETHICONE 80 MG PO CHEW
160.0000 mg | CHEWABLE_TABLET | Freq: Four times a day (QID) | ORAL | Status: DC | PRN
  Administered 2022-05-11: 160 mg via ORAL
  Filled 2022-05-11: qty 2

## 2022-05-11 MED ORDER — LIDOCAINE 2% (20 MG/ML) 5 ML SYRINGE
INTRAMUSCULAR | Status: DC | PRN
  Administered 2022-05-11: 40 mg via INTRAVENOUS

## 2022-05-11 MED ORDER — PANTOPRAZOLE SODIUM 40 MG IV SOLR
40.0000 mg | Freq: Two times a day (BID) | INTRAVENOUS | Status: DC
  Administered 2022-05-11 – 2022-05-12 (×3): 40 mg via INTRAVENOUS
  Filled 2022-05-11 (×3): qty 10

## 2022-05-11 MED ORDER — PROPOFOL 10 MG/ML IV BOLUS
INTRAVENOUS | Status: DC | PRN
  Administered 2022-05-11 (×2): 20 mg via INTRAVENOUS

## 2022-05-11 MED ORDER — LACTATED RINGERS IV SOLN
INTRAVENOUS | Status: AC | PRN
  Administered 2022-05-11: 1000 mL via INTRAVENOUS

## 2022-05-11 MED ORDER — PROPOFOL 500 MG/50ML IV EMUL
INTRAVENOUS | Status: DC | PRN
  Administered 2022-05-11: 150 ug/kg/min via INTRAVENOUS

## 2022-05-11 NOTE — Op Note (Signed)
[facility] Patient Name: Lawrence Brock Procedure Date : 05/11/2022 MRN: [id number] Attending MD: Gladstone Pih. Candis Schatz , MD, [id number] Date of Birth: [birth year] CSN: [id number] Age: 52 Admit Type: Inpatient Procedure:                Upper GI endoscopy Indications:              Hematemesis Providers:                Nicki Reaper E. Candis Schatz, MD, Doristine Johns, RN,                            Brien Mates, Technician Referring MD:              Medicines:                Monitored Anesthesia Care Complications:            No immediate complications. Estimated Blood Loss:     Estimated blood loss was minimal. Procedure:                Pre-Anesthesia Assessment:                           - Prior to the procedure, a History and Physical                            was performed, and patient medications and                            allergies were reviewed. The patient's tolerance of                            previous anesthesia was also reviewed. The risks                            and benefits of the procedure and the sedation                            options and risks were discussed with the patient.                            All questions were answered, and informed consent                            was obtained. Prior Anticoagulants: The patient has                            taken no anticoagulant or antiplatelet agents. ASA                            Grade Assessment: III - A patient with severe                            systemic disease. After reviewing the risks and  benefits, the patient was deemed in satisfactory                            condition to undergo the procedure.                           After obtaining informed consent, the endoscope was                            passed under direct vision. Throughout the                            procedure, the patient's blood pressure, pulse, and                            oxygen  saturations were monitored continuously. The                            [id number] Olympus endoscope was introduced                            through the mouth, and advanced to the second part                            of duodenum. The upper GI endoscopy was                            accomplished without difficulty. The patient                            tolerated the procedure well. Scope In: Scope Out: Findings:      The examined portions of the nasopharynx, oropharynx and larynx were       normal.      Grade II varices were found in the middle third of the esophagus and in       the lower third of the esophagus, extending from 43 cm to 24 cm. They       were large in size, but no stigmata of bleeding were seen. Five bands       were successfully placed with incomplete eradication of varices. There       was no bleeding during the maneuver.      The exam of the esophagus was otherwise normal.      Severe portal hypertensive gastropathy was found in the gastric fundus       and in the gastric body. Questionable gastric varices seen in the cardia      One non-bleeding superficial linear gastric ulcer with no stigmata of       bleeding was found in the prepyloric region of the stomach. The lesion       was 2-3 mm in largest dimension. Biopsies were taken with a cold forceps       for Helicobacter pylori testing. Estimated blood loss was minimal.      Diffuse mildly friable mucosa without active bleeding was found in the       entire duodenum.      The ampulla was prominent with 'shaggy' mucosa.  Careful biopsies were       taken away from the papilla with a cold forceps for histology to rule       out adenoma. Estimated blood loss was minimal.      The exam of the duodenum was otherwise normal. Impression:               - The examined portions of the nasopharynx,                            oropharynx and larynx were normal.                           - Grade II esophageal  varices. Incompletely                            eradicated. Banded.                           - Portal hypertensive gastropathy. Questionable                            GOV2 varices                           - Non-bleeding gastric ulcer with no stigmata of                            bleeding. Biopsied.                           - Friable duodenal mucosa.                           - Abnormal appearing ampula, likely related to                            history chronic pancreatitis and previous stenting.                            Biopsied to r/o adenoma.                           - Although no stigmata were seen on the varices,                            his clinical presentation seemed more consistent                            with a variceal bleed as opposed to bleeding from                            portal hypertensive gastropathy (large volume                            hematemesis with hypotension). The small antral  ulcer was unlikely to have been a bleeding source. Moderate Sedation:      N/A Recommendation:           - Return patient to hospital ward for ongoing care.                           - Soft diet today.                           - Continue octreotide gtt today                           - Continue PO PPI, can switch to PO                           - Continue ceftriaxone today                           - Await pathology results.                           - Recommend repeat upper endoscopy in 4 weeks with                            primary gastroenterologist for rebanding. Given                            severe thrombocytopenia, use of Nplate or other                            thrombopoeitin agonist with subsequent bandings                            could be considered.                           - Restart propranolol 20 mg PO BID at discharge Procedure Code(s):        --- Professional ---                           [id number],  Esophagogastroduodenoscopy, flexible,                            transoral; with band ligation of esophageal/gastric                            varices                           43239, Esophagogastroduodenoscopy, flexible,                            transoral; with biopsy, single or multiple Diagnosis Code(s):        --- Professional ---                           I85.00, Esophageal varices without bleeding  K76.6, Portal hypertension                           K31.89, Other diseases of stomach and duodenum                           K25.9, Gastric ulcer, unspecified as acute or                            chronic, without hemorrhage or perforation                           K92.0, Hematemesis CPT copyright 2022 American Medical Association. All rights reserved. The codes documented in this report are preliminary and upon coder review may  be revised to meet current compliance requirements. Hallee Mckenny E. Candis Schatz, MD 05/11/2022 9:04:34 AM This report has been signed electronically. Number of Addenda: 0

## 2022-05-11 NOTE — Plan of Care (Signed)

## 2022-05-11 NOTE — Interval H&P Note (Signed)
History and Physical Interval Note:  05/11/2022 7:31 AM  Lawrence Brock  has presented today for surgery, with the diagnosis of Hematemesis, history of varices, history of portal gastropathy, syncope.  The various methods of treatment have been discussed with the patient and family. After consideration of risks, benefits and other options for treatment, the patient has consented to  Procedure(s): ESOPHAGOGASTRODUODENOSCOPY (EGD) (N/A) FLEXIBLE SIGMOIDOSCOPY (N/A) as a surgical intervention.  The patient's history has been reviewed, patient examined, no change in status, stable for surgery.  I have reviewed the patient's chart and labs.  Questions were answered to the patient's satisfaction.     Daryel November

## 2022-05-11 NOTE — Progress Notes (Signed)
PROGRESS NOTE  Lawrence Brock [id number] DOB: 23-Feb-1971   PCP: [name], MD  Patient is from: Home  DOA: 05/10/2022 LOS: 0  Chief complaints Chief Complaint  Patient presents with   Fall     Brief Narrative / Interim history: 52 year old M with PMH of EtOH cirrhosis with EV, COPD, DM-2, HTN, chronic pancreatitis, portal vein thrombosis not on AC, pancreatic mass, thrombocytopenia, tobacco use disorder and alcohol abuse in remission presenting with hematemesis, melanotic stool with blood clots, syncope, chest pain and LLQ pain, and admitted for acute blood loss anemia due to upper GI bleed with syncope and collapse.  Patient was recently started on p.o. ketorolac when he was at [facility] hospital.  Hgb 11.4 and trended down to 10.3.  Baseline was about 14 4 days prior.  He had emergent transfusion.  CT angio bleed did not show extravasation.  GI consulted.  Started on IV fluid, Protonix, octreotide drip and IV ceftriaxone.   The next day, underwent EGD that showed G2-EV (incompletely eradicated and banded) PHG and questionable GOV 2 varices, nonbleeding GU with no stigmata (biopsied), friable duodenal mucosa and abnormal appearing ampulla likely from chronic pancreatitis (biopsied to rule out adenoma).   Flex sigmoidoscopy showed congested mucosa in the rectum and sigmoid colon (biopsy), internal hemorrhoid felt to be most likely cause of patient's hematochezia.  GI recommended soft diet, PPI, continuing octreotide and ceftriaxone, restarting propranolol at 20 mg twice daily on discharge, and repeat EGD for rebounding in 4 weeks.    Subjective: Seen and examined earlier this morning after he returned from endoscopy.  No major complaints other than some abdominal pain across lower abdomen.  He rates his pain 6/10.  Denies chest pain or dyspnea.  Objective: Vitals:   05/11/22 0825 05/11/22 0840 05/11/22 0900 05/11/22 1148  BP: 108/75 110/80 (!) 134/99 (!) 126/93   Pulse: 71 72 78   Resp: 17 16 16   $ Temp: 97.6 F (36.4 C) 98.1 F (36.7 C) 98.1 F (36.7 C) 97.8 F (36.6 C)  TempSrc:   Oral Oral  SpO2: 100% 98% 100% 100%  Weight:      Height:        Examination:  GENERAL: No apparent distress.  Nontoxic. HEENT: MMM.  Vision and hearing grossly intact.  NECK: Supple.  No apparent JVD.  RESP:  No IWOB.  Fair aeration bilaterally. CVS:  RRR. Heart sounds normal.  ABD/GI/GU: BS+. Abd soft.  Mild diffuse tenderness. MSK/EXT:  Moves extremities. No apparent deformity. No edema.  SKIN: no apparent skin lesion or wound NEURO: Awake, alert and oriented appropriately.  No apparent focal neuro deficit. PSYCH: Calm. Normal affect.   Procedures:  2/17-EGD that showed G2-EV (incompletely eradicated and banded) PHG and questionable GOV 2 varices, nonbleeding GU with no stigmata (biopsied), friable duodenal mucosa and abnormal appearing ampulla likely from chronic pancreatitis (biopsied to rule out adenoma).   2/17-Flex sigmoidoscopy showed congested mucosa in the rectum and sigmoid colon (biopsy), internal hemorrhoid felt to be most likely cause of patient's hematochezia.   Microbiology summarized: Blood cultures NGTD   Assessment and plan: Principal Problem:   Hematemesis with nausea Active Problems:   Acute upper GI bleeding   Acute blood loss anemia   Lactic acidosis   Syncope   Chest pain   Lower abdominal pain   Thrombocytopenia, unspecified (HCC)   Alcohol abuse, in remission   Tobacco abuse   Pancytopenia (HCC)   Alcoholic cirrhosis of liver without ascites ([place])  Abnormal finding on GI tract imaging   Hematochezia   Hypotension   Alcohol-induced chronic pancreatitis (HCC)   Splenomegaly   Internal hemorrhoid  Acute blood loss anemia due to GI bleed: Concern for both upper and lower GI bleed.  He also have significant thrombocytopenia.  Recently started on p.o. ketorolac at outside hospital.  It seems like he had a total of 3.5  L blood/blood products so far. S/p EGD and flex sigmoidoscopy as above. Recent Labs    05/10/22 0357 05/10/22 0406 05/10/22 0720 05/10/22 1158 05/10/22 1608 05/11/22 0419  HGB 11.4* 11.9* 10.3* 9.1* 10.0* 9.6*  -Appreciate GI recommendation -soft diet,  -PPI,  -continuing octreotide and ceftriaxone for today -repeat EGD for rebounding in 4 week -Continue monitoring H&H -Advised to avoid NSAID -Discontinue ketorolac on discharge  Syncope and collapse/hypotension: Likely due to hypotension.  No event on telemetry.  EKG without significant ischemic finding.  Serial troponin negative.  Hypotension resolved.  History of alcoholic cirrhosis with EV/PHG and portal vein thrombosis.  Compensated.  Currently in remission from alcohol. -GI recommended restarting propranolol 20 mg twice daily on discharge  Lactic acidosis: Likely due to hypotension.  Low suspicion for infection.  Resolved.   Atypical chest pain: Resolved.  Serial EKG, troponin and telemetry reassuring.  Pain could be GI related.   Lower abdominal pain: Improved. CT from 2/11 noted concern for mild circumferential bowel wall thickening involving the colon concerning for infectious or inflammatory colitis versus passive congestion related to mesenteric venous hypertension and pancreatitis.  CRP within normal.  Low suspicion for infection -Continue ceftriaxone and Flagyl for now. -Pain control   Acute on chronic thrombocytopenia/pancytopenia: Likely due to liver cirrhosis and splenomegaly.  It seems he refused one half twice of platelet prior to EGD Recent Labs  Lab 05/10/22 0357 05/10/22 0720 05/11/22 0419  PLT 94* 36* 37*  -Continue monitoring.   Alcohol abuse in remission: Reports that he stopped drinking alcohol approximately 9 years ago. -Congratulated.   Tobacco abuse: Reports smoking 1.5 packs of cigarettes per day on average. -Counseled on need of cessation  -Nicotine patch   Internal hemorrhoid: Felt to be  source of his hematochezia. -Bowel regimen to avoid constipation once bleeding stops   Body mass index is 18.25 kg/m.          DVT prophylaxis:  SCDs Start: 05/10/22 0853  Code Status: Full code Family Communication: None at bedside Level of care: Progressive Status is: Observation The patient will require care spanning > 2 midnights and should be moved to inpatient because: Due to symptomatic acute blood loss anemia with significant blood loss   Final disposition: Home Consultants:  Gastroenterology  55 minutes with more than 50% spent in reviewing records, counseling patient/family and coordinating care.   Sch Meds:  Scheduled Meds:  sodium chloride   Intravenous Once   sodium chloride   Intravenous Once   dicyclomine  20 mg Oral TID AC   nicotine  21 mg Transdermal Daily   sodium chloride flush  3 mL Intravenous Q12H   Continuous Infusions:  sodium chloride 100 mL/hr at 05/10/22 1828   cefTRIAXone (ROCEPHIN)  IV 2 g (05/11/22 1122)   metronidazole 500 mg (05/11/22 0941)   octreotide (SANDOSTATIN) 500 mcg in sodium chloride 0.9 % 250 mL (2 mcg/mL) infusion 50 mcg/hr (05/10/22 1952)   pantoprazole 8 mg/hr (05/11/22 0013)   PRN Meds:.acetaminophen **OR** acetaminophen, albuterol, mouth rinse, oxyCODONE, trimethobenzamide  Antimicrobials: Anti-infectives (From admission, onward)    Start  Dose/Rate Route Frequency Ordered Stop   05/11/22 0800  cefTRIAXone (ROCEPHIN) 2 g in sodium chloride 0.9 % 100 mL IVPB        2 g 200 mL/hr over 30 Minutes Intravenous Every 24 hours 05/10/22 0851     05/10/22 0930  metroNIDAZOLE (FLAGYL) IVPB 500 mg        500 mg 100 mL/hr over 60 Minutes Intravenous Every 12 hours 05/10/22 0922     05/10/22 0430  cefTRIAXone (ROCEPHIN) 2 g in sodium chloride 0.9 % 100 mL IVPB        2 g 200 mL/hr over 30 Minutes Intravenous  Once 05/10/22 0423 05/10/22 0631   05/10/22 0415  cefTRIAXone (ROCEPHIN) injection 2 mg  Status:  Discontinued         2 mg Intramuscular  Once 05/10/22 0409 05/10/22 0423        I have personally reviewed the following labs and images: CBC: Recent Labs  Lab 05/10/22 0357 05/10/22 0406 05/10/22 0720 05/10/22 1158 05/10/22 1608 05/11/22 0419  WBC 9.4  --  6.3  --   --  2.5*  NEUTROABS 7.2  --   --   --   --   --   HGB 11.4* 11.9* 10.3* 9.1* 10.0* 9.6*  HCT 34.3* 35.0* 30.3* 27.7* 30.5* 28.3*  MCV 89.1  --  88.1  --   --  87.1  PLT 94*  --  36*  --   --  37*   BMP &GFR Recent Labs  Lab 05/10/22 0357 05/10/22 0406 05/11/22 0419  NA 139 140 139  K 4.8 4.9 3.7  CL 108 107 110  CO2 24  --  22  GLUCOSE 118* 115* 71  BUN 12 13 10  $ CREATININE 1.09 1.00 0.91  CALCIUM 8.2*  --  7.8*   Estimated Creatinine Clearance: 74.7 mL/min (by C-G formula based on SCr of 0.91 mg/dL). Liver & Pancreas: Recent Labs  Lab 05/10/22 0357  AST 27  ALT 30  ALKPHOS 90  BILITOT 0.8  PROT 5.4*  ALBUMIN 3.0*   No results for input(s): "LIPASE", "AMYLASE" in the last 168 hours. Recent Labs  Lab 05/10/22 0357  AMMONIA 17   Diabetic: No results for input(s): "HGBA1C" in the last 72 hours. No results for input(s): "GLUCAP" in the last 168 hours. Cardiac Enzymes: No results for input(s): "CKTOTAL", "CKMB", "CKMBINDEX", "TROPONINI" in the last 168 hours. No results for input(s): "PROBNP" in the last 8760 hours. Coagulation Profile: Recent Labs  Lab 05/10/22 0357  INR 1.2   Thyroid Function Tests: No results for input(s): "TSH", "T4TOTAL", "FREET4", "T3FREE", "THYROIDAB" in the last 72 hours. Lipid Profile: No results for input(s): "CHOL", "HDL", "LDLCALC", "TRIG", "CHOLHDL", "LDLDIRECT" in the last 72 hours. Anemia Panel: No results for input(s): "VITAMINB12", "FOLATE", "FERRITIN", "TIBC", "IRON", "RETICCTPCT" in the last 72 hours. Urine analysis:    Component Value Date/Time   COLORURINE YELLOW 05/10/2022 0609   APPEARANCEUR CLEAR 05/10/2022 0609   LABSPEC >1.046 (H) 05/10/2022 0609    PHURINE 5.0 05/10/2022 0609   GLUCOSEU NEGATIVE 05/10/2022 0609   HGBUR NEGATIVE 05/10/2022 0609   BILIRUBINUR NEGATIVE 05/10/2022 0609   KETONESUR NEGATIVE 05/10/2022 0609   PROTEINUR NEGATIVE 05/10/2022 0609   UROBILINOGEN 0.2 02/25/2014 1320   NITRITE NEGATIVE 05/10/2022 0609   LEUKOCYTESUR NEGATIVE 05/10/2022 0609   Sepsis Labs: Invalid input(s): "PROCALCITONIN", "LACTICIDVEN"  Microbiology: Recent Results (from the past 240 hour(s))  Blood Culture (routine x 2)     Status: None (  Preliminary result)   Collection Time: 05/10/22  4:15 AM   Specimen: BLOOD LEFT ARM  Result Value Ref Range Status   Specimen Description BLOOD LEFT ARM  Final   Special Requests   Final    BOTTLES DRAWN AEROBIC AND ANAEROBIC Blood Culture adequate volume   Culture   Final    NO GROWTH 1 DAY Performed at [facility], [address], [place], [place] [address]    Report Status PENDING  Incomplete  Blood Culture (routine x 2)     Status: None (Preliminary result)   Collection Time: 05/10/22  5:54 AM   Specimen: BLOOD  Result Value Ref Range Status   Specimen Description BLOOD BLOOD LEFT WRIST  Final   Special Requests   Final    BOTTLES DRAWN AEROBIC AND ANAEROBIC Blood Culture adequate volume   Culture   Final    NO GROWTH 1 DAY Performed at [facility], [place] [address], [place],  [address]    Report Status PENDING  Incomplete    Radiology Studies: No results found.    Sriya Kroeze T. Sanger  If 7PM-7AM, please contact night-coverage [email or web address] 05/11/2022, 12:42 PM

## 2022-05-11 NOTE — Anesthesia Postprocedure Evaluation (Signed)
Anesthesia Post Note  Patient: Lawrence Brock  Procedure(s) Performed: ESOPHAGOGASTRODUODENOSCOPY (EGD) FLEXIBLE SIGMOIDOSCOPY BIOPSY ESOPHAGEAL BANDING     Patient location during evaluation: PACU Anesthesia Type: MAC Level of consciousness: awake and alert Pain management: pain level controlled Vital Signs Assessment: post-procedure vital signs reviewed and stable Respiratory status: spontaneous breathing Cardiovascular status: stable Anesthetic complications: no   No notable events documented.  Last Vitals:  Vitals:   05/11/22 0900 05/11/22 1148  BP: (!) 134/99 (!) 126/93  Pulse: 78   Resp: 16   Temp: 36.7 C 36.6 C  SpO2: 100% 100%    Last Pain:  Vitals:   05/11/22 1148  TempSrc: Oral  PainSc:                  Nolon Nations

## 2022-05-11 NOTE — Transfer of Care (Signed)
Immediate Anesthesia Transfer of Care Note  Patient: Lawrence Brock  Procedure(s) Performed: ESOPHAGOGASTRODUODENOSCOPY (EGD) FLEXIBLE SIGMOIDOSCOPY BIOPSY ESOPHAGEAL BANDING  Patient Location: PACU  Anesthesia Type:MAC  Level of Consciousness: sedated  Airway & Oxygen Therapy: Patient Spontanous Breathing  Post-op Assessment: Report given to RN and Post -op Vital signs reviewed and stable  Post vital signs: Reviewed and stable  Last Vitals:  Vitals Value Taken Time  BP    Temp    Pulse    Resp    SpO2      Last Pain:  Vitals:   05/11/22 0717  TempSrc: Temporal  PainSc: 0-No pain         Complications: No notable events documented.

## 2022-05-11 NOTE — Op Note (Signed)
[facility] Patient Name: Lawrence Brock Procedure Date : 05/11/2022 MRN: [id number] Attending MD: Gladstone Pih. Candis Schatz , MD, [id number] Date of Birth: 05-01-1970 CSN: [id number] Age: 52 Admit Type: Inpatient Procedure:                Flexible Sigmoidoscopy Indications:              Hematochezia, Abnormal CT of the GI tract Providers:                Nicki Reaper E. Candis Schatz, MD, Doristine Johns, RN,                            Brien Mates, Technician Referring MD:              Medicines:                Monitored Anesthesia Care Complications:            No immediate complications. Estimated Blood Loss:     Estimated blood loss was minimal. Procedure:                Pre-Anesthesia Assessment:                           - Prior to the procedure, a History and Physical                            was performed, and patient medications and                            allergies were reviewed. The patient's tolerance of                            previous anesthesia was also reviewed. The risks                            and benefits of the procedure and the sedation                            options and risks were discussed with the patient.                            All questions were answered, and informed consent                            was obtained. Prior Anticoagulants: The patient has                            taken no anticoagulant or antiplatelet agents. ASA                            Grade Assessment: III - A patient with severe                            systemic disease. After reviewing the risks and  benefits, the patient was deemed in satisfactory                            condition to undergo the procedure.                           After obtaining informed consent, the scope was                            passed under direct vision. The [id number]                            Olympus endoscope was introduced through the anus                             and advanced to the the sigmoid colon. The flexible                            sigmoidoscopy was accomplished without difficulty.                            The patient tolerated the procedure well. The                            quality of the bowel preparation was adequate. Scope In: 8:13:40 AM Scope Out: 8:17:02 AM Total Procedure Duration: 0 hours 3 minutes 22 seconds  Findings:      The perianal and digital rectal examinations were normal. Pertinent       negatives include normal sphincter tone and no palpable rectal lesions.      There was no overt inflammation in the rectum or sigmoid colon. The       mucosa did appear slightly pale and congested. Biopsies were taken with       a cold forceps for histology. Estimated blood loss was minimal.      The exam was otherwise without abnormality.      Non-bleeding internal hemorrhoids were found during retroflexion. The       hemorrhoids were Grade I (internal hemorrhoids that do not prolapse).      The retroflexed view of the distal rectum and anal verge was otherwise       normal and showed no anal or rectal abnormalities. Impression:               - Questionabl congested mucosa in the rectum and in                            the sigmoid colon. Biopsied.                           - Internal hemorrhoids which are the most likely                            cause of the patient's hematochezia.                           - The examination was otherwise normal.  Moderate Sedation:      N/A Recommendation:           - Return patient to hospital ward for ongoing care.                           - Continue care plan for variceal bleed as                            discussed in EGD report Procedure Code(s):        --- Professional ---                           [phone], Sigmoidoscopy, flexible; with biopsy, single                            or multiple Diagnosis Code(s):        --- Professional ---                            K62.89, Other specified diseases of anus and rectum                           K63.89, Other specified diseases of intestine                           K92.1, Melena (includes Hematochezia)                           R93.3, Abnormal findings on diagnostic imaging of                            other parts of digestive tract CPT copyright 2022 [employer]. All rights reserved. The codes documented in this report are preliminary and upon coder review may  be revised to meet current compliance requirements. Deah Ottaway E. Candis Schatz, MD 05/11/2022 9:09:01 AM This report has been signed electronically. Number of Addenda: 0

## 2022-05-11 NOTE — Plan of Care (Signed)

## 2022-05-12 DIAGNOSIS — K922 Gastrointestinal hemorrhage, unspecified: Secondary | ICD-10-CM | POA: Diagnosis not present

## 2022-05-12 DIAGNOSIS — D62 Acute posthemorrhagic anemia: Secondary | ICD-10-CM | POA: Diagnosis not present

## 2022-05-12 DIAGNOSIS — K7030 Alcoholic cirrhosis of liver without ascites: Secondary | ICD-10-CM | POA: Diagnosis not present

## 2022-05-12 DIAGNOSIS — I8511 Secondary esophageal varices with bleeding: Secondary | ICD-10-CM | POA: Diagnosis not present

## 2022-05-12 DIAGNOSIS — K766 Portal hypertension: Secondary | ICD-10-CM

## 2022-05-12 DIAGNOSIS — K920 Hematemesis: Secondary | ICD-10-CM | POA: Diagnosis not present

## 2022-05-12 DIAGNOSIS — K3189 Other diseases of stomach and duodenum: Secondary | ICD-10-CM | POA: Diagnosis not present

## 2022-05-12 DIAGNOSIS — E872 Acidosis, unspecified: Secondary | ICD-10-CM | POA: Diagnosis not present

## 2022-05-12 LAB — IRON AND TIBC
Iron: 27 ug/dL — ABNORMAL LOW (ref 45–182)
Saturation Ratios: 9 % — ABNORMAL LOW (ref 17.9–39.5)
TIBC: 316 ug/dL (ref 250–450)
UIBC: 289 ug/dL

## 2022-05-12 LAB — COMPREHENSIVE METABOLIC PANEL
ALT: 19 U/L (ref 0–44)
AST: 14 U/L — ABNORMAL LOW (ref 15–41)
Albumin: 2.7 g/dL — ABNORMAL LOW (ref 3.5–5.0)
Alkaline Phosphatase: 61 U/L (ref 38–126)
Anion gap: 8 (ref 5–15)
BUN: 8 mg/dL (ref 6–20)
CO2: 23 mmol/L (ref 22–32)
Calcium: 7.8 mg/dL — ABNORMAL LOW (ref 8.9–10.3)
Chloride: 107 mmol/L (ref 98–111)
Creatinine, Ser: 0.91 mg/dL (ref 0.61–1.24)
GFR, Estimated: >60 mL/min (ref >60)
Glucose, Bld: 152 mg/dL — ABNORMAL HIGH (ref 70–99)
Potassium: 3.7 mmol/L (ref 3.5–5.1)
Sodium: 138 mmol/L (ref 135–145)
Total Bilirubin: 0.6 mg/dL (ref 0.3–1.2)
Total Protein: 4.9 g/dL — ABNORMAL LOW (ref 6.5–8.1)

## 2022-05-12 LAB — CBC WITH DIFFERENTIAL/PLATELET
Abs Immature Granulocytes: 0 10*3/uL (ref 0.00–0.07)
Basophils Absolute: 0 10*3/uL (ref 0.0–0.1)
Basophils Relative: 0 %
Eosinophils Absolute: 0.1 10*3/uL (ref 0.0–0.5)
Eosinophils Relative: 4 %
HCT: 21.7 % — ABNORMAL LOW (ref 39.0–52.0)
Hemoglobin: 7.4 g/dL — ABNORMAL LOW (ref 13.0–17.0)
Immature Granulocytes: 0 %
Lymphocytes Relative: 14 %
Lymphs Abs: 0.2 10*3/uL — ABNORMAL LOW (ref 0.7–4.0)
MCH: 30.1 pg (ref 26.0–34.0)
MCHC: 34.1 g/dL (ref 30.0–36.0)
MCV: 88.2 fL (ref 80.0–100.0)
Monocytes Absolute: 0.2 10*3/uL (ref 0.1–1.0)
Monocytes Relative: 10 %
Neutro Abs: 1.2 10*3/uL — ABNORMAL LOW (ref 1.7–7.7)
Neutrophils Relative %: 72 %
Platelets: 28 10*3/uL — CL (ref 150–400)
RBC: 2.46 MIL/uL — ABNORMAL LOW (ref 4.22–5.81)
RDW: 15.2 % (ref 11.5–15.5)
WBC: 1.6 10*3/uL — ABNORMAL LOW (ref 4.0–10.5)
nRBC: 0 % (ref 0.0–0.2)

## 2022-05-12 LAB — CBC
HCT: 27.2 % — ABNORMAL LOW (ref 39.0–52.0)
Hemoglobin: 9.2 g/dL — ABNORMAL LOW (ref 13.0–17.0)
MCH: 29.4 pg (ref 26.0–34.0)
MCHC: 33.8 g/dL (ref 30.0–36.0)
MCV: 86.9 fL (ref 80.0–100.0)
Platelets: 37 10*3/uL — ABNORMAL LOW (ref 150–400)
RBC: 3.13 MIL/uL — ABNORMAL LOW (ref 4.22–5.81)
RDW: 15.4 % (ref 11.5–15.5)
WBC: 2.1 10*3/uL — ABNORMAL LOW (ref 4.0–10.5)
nRBC: 0 % (ref 0.0–0.2)

## 2022-05-12 LAB — RETICULOCYTES
Immature Retic Fract: 9.7 % (ref 2.3–15.9)
RBC.: 3.06 MIL/uL — ABNORMAL LOW (ref 4.22–5.81)
Retic Count, Absolute: 78.6 10*3/uL (ref 19.0–186.0)
Retic Ct Pct: 2.6 % (ref 0.4–3.1)

## 2022-05-12 LAB — PROTIME-INR
INR: 1.2 (ref 0.8–1.2)
Prothrombin Time: 15.4 seconds — ABNORMAL HIGH (ref 11.4–15.2)

## 2022-05-12 LAB — FOLATE: Folate: 14.9 ng/mL (ref >5.9)

## 2022-05-12 LAB — PHOSPHORUS: Phosphorus: 3.9 mg/dL (ref 2.5–4.6)

## 2022-05-12 LAB — FERRITIN: Ferritin: 19 ng/mL — ABNORMAL LOW (ref 24–336)

## 2022-05-12 LAB — GLUCOSE, CAPILLARY: Glucose-Capillary: 77 mg/dL (ref 70–99)

## 2022-05-12 LAB — VITAMIN B12: Vitamin B-12: 314 pg/mL (ref 180–914)

## 2022-05-12 LAB — MAGNESIUM: Magnesium: 1.8 mg/dL (ref 1.7–2.4)

## 2022-05-12 MED ORDER — PROPRANOLOL HCL 20 MG PO TABS: 20.0000 mg | ORAL_TABLET | Freq: Two times a day (BID) | ORAL | 0 refills | Status: DC

## 2022-05-12 MED ORDER — PANTOPRAZOLE SODIUM 40 MG PO TBEC: DELAYED_RELEASE_TABLET | ORAL | 0 refills | Status: DC

## 2022-05-12 MED ORDER — SODIUM CHLORIDE 0.9 % IV SOLN
250.0000 mg | Freq: Every day | INTRAVENOUS | Status: DC
  Administered 2022-05-12: 250 mg via INTRAVENOUS
  Filled 2022-05-12 (×2): qty 20

## 2022-05-12 MED ORDER — AMOXICILLIN-POT CLAVULANATE 875-125 MG PO TABS: 1.0000 | ORAL_TABLET | Freq: Two times a day (BID) | ORAL | 0 refills | Status: AC

## 2022-05-12 MED ORDER — NICOTINE 21 MG/24HR TD PT24: 21.0000 mg | MEDICATED_PATCH | Freq: Every day | TRANSDERMAL | 0 refills | Status: DC

## 2022-05-12 MED ORDER — POLYETHYLENE GLYCOL 3350 17 GM/SCOOP PO POWD: 17.0000 g | Freq: Two times a day (BID) | ORAL | 2 refills | Status: DC | PRN

## 2022-05-12 NOTE — Progress Notes (Signed)
Pt has to received iron infusion over 2 hrs and then can be discharged home. Pt's wife will transport home.

## 2022-05-12 NOTE — Plan of Care (Signed)

## 2022-05-12 NOTE — Progress Notes (Signed)
PROGRESS NOTE  Lawrence Brock [id number] DOB: [birth year]   PCP: [name], MD  Patient is from: Home  DOA: 05/10/2022 LOS: 1  Chief complaints Chief Complaint  Patient presents with   Fall     Brief Narrative / Interim history: 52 year old M with PMH of EtOH cirrhosis with EV, COPD, DM-2, HTN, chronic pancreatitis, portal vein thrombosis not on AC, pancreatic mass, thrombocytopenia, tobacco use disorder and alcohol abuse in remission presenting with hematemesis, melanotic stool with blood clots, syncope, chest pain and LLQ pain, and admitted for acute blood loss anemia due to upper GI bleed with syncope and collapse.  Patient was recently started on p.o. ketorolac when he was at [facility] hospital.  Hgb 11.4 and trended down to 10.3.  Baseline was about 14 4 days prior.  He had emergent transfusion.  CT angio bleed did not show extravasation.  GI consulted.  Started on IV fluid, Protonix, octreotide drip and IV ceftriaxone.   The next day, underwent EGD that showed G2-EV (incompletely eradicated and banded) PHG and questionable GOV 2 varices, nonbleeding GU with no stigmata (biopsied), friable duodenal mucosa and abnormal appearing ampulla likely from chronic pancreatitis (biopsied to rule out adenoma).   Flex sigmoidoscopy showed congested mucosa in the rectum and sigmoid colon (biopsy), internal hemorrhoid felt to be most likely cause of patient's hematochezia.  GI recommended soft diet, PPI, continuing octreotide and ceftriaxone, restarting propranolol at 20 mg twice daily on discharge, and repeat EGD for rebounding in 4 weeks.  Patient's pancytopenia worse.  Ongoing melanotic stool.     Subjective: Seen and examined earlier this morning.  Patient had no complaints when I saw him this morning.  He told me he had a normal-looking bowel movement yesterday and this morning.  He was eager to go home.  However, his hemoglobin dropped further to 7.4.  His pancytopenia is  worse.  He later admitted having dark red bowel movements to GI.   Objective: Vitals:   05/11/22 1800 05/11/22 1900 05/11/22 2000 05/12/22 0751  BP:   115/78 (!) 132/90  Pulse: 61 66 72   Resp: 16 16 20   $ Temp:   97.8 F (36.6 C) 98 F (36.7 C)  TempSrc:   Oral Oral  SpO2: 98% 98% 99%   Weight:      Height:        Examination:  GENERAL: No apparent distress.  Nontoxic. HEENT: MMM.  Vision and hearing grossly intact.  NECK: Supple.  No apparent JVD.  RESP:  No IWOB.  Fair aeration bilaterally. CVS:  RRR. Heart sounds normal.  ABD/GI/GU: BS+. Abd soft, NTND.  MSK/EXT:   No apparent deformity. Moves extremities. No edema.  SKIN: no apparent skin lesion or wound NEURO: Awake and alert. Oriented appropriately.  No apparent focal neuro deficit. PSYCH: Calm. Normal affect.   Procedures:  2/17-EGD that showed G2-EV (incompletely eradicated and banded) PHG and questionable GOV 2 varices, nonbleeding GU with no stigmata (biopsied), friable duodenal mucosa and abnormal appearing ampulla likely from chronic pancreatitis (biopsied to rule out adenoma).   2/17-Flex sigmoidoscopy showed congested mucosa in the rectum and sigmoid colon (biopsy), internal hemorrhoid felt to be most likely cause of patient's hematochezia.   Microbiology summarized: Blood cultures NGTD   Assessment and plan: Principal Problem:   Hematemesis with nausea Active Problems:   Acute upper GI bleeding   Acute blood loss anemia   Lactic acidosis   Syncope   Chest pain   Lower abdominal  pain   Thrombocytopenia, unspecified ([place])   Alcohol abuse, in remission   Tobacco abuse   Pancytopenia ([place])   Alcoholic cirrhosis of liver without ascites (HCC)   Abnormal finding on GI tract imaging   Hematochezia   Hypotension   Alcohol-induced chronic pancreatitis ([place])   Splenomegaly   Internal hemorrhoid   Acute GI bleeding  Acute blood loss anemia due to GI bleed: Concern for both upper and lower GI bleed.  He  also have significant thrombocytopenia.  Recently started on p.o. ketorolac at outside hospital.  It seems like he had a total of 3.5 L blood/blood products so far. S/p EGD and flex sigmoidoscopy as above.  Ongoing melanotic stool.  Pancytopenia worse.  INR is 1.2.  Recent Labs    05/10/22 0357 05/10/22 0406 05/10/22 0720 05/10/22 1158 05/10/22 1608 05/11/22 0419 05/11/22 1529 05/12/22 0918  HGB 11.4* 11.9* 10.3* 9.1* 10.0* 9.6* 9.9* 7.4*  -Appreciate GI recommendation -Continue soft diet -Continue PPI twice daily -Continue octreotide infusion -Monitor H&H and transfuse for Hgb <7.0 -Await EGD and flex sigmoidoscopy biopsy results -Restart propranolol 20 mg twice daily at discharge -Repeat EGD for rebounding in 4 week -IV ferric gluconate 250 mg daily x 2 doses -Advised to avoid NSAID -Discontinue ketorolac on discharge  Acute on chronic pancytopenia: Likely due to GI bleed, liver cirrhosis and splenomegaly.  His BUN is normal.  His leukopenia is mainly lymphocytopenia.  Anemia panel with iron deficiency Recent Labs  Lab 05/10/22 0357 05/10/22 0720 05/11/22 0419 05/11/22 1529 05/12/22 0918  PLT 94* 36* 37* 39* 28*  -Monitor H&H.  Syncope and collapse/hypotension: Likely due to hypotension.  No event on telemetry.  EKG without significant ischemic finding.  Serial troponin negative.  Hypotension resolved.  History of alcoholic cirrhosis with EV/PHG and portal vein thrombosis.  Compensated.  Currently in remission from alcohol. -GI recommended restarting propranolol 20 mg twice daily on discharge  Lactic acidosis: Likely due to hypotension.  Low suspicion for infection.  Resolved.   Atypical chest pain: Resolved.  Serial EKG, troponin and telemetry reassuring.  Pain could be GI related.   Lower abdominal pain: Improved. CT from 2/11 noted concern for mild circumferential bowel wall thickening involving the colon concerning for infectious or inflammatory colitis versus  passive congestion related to mesenteric venous hypertension and pancreatitis.  CRP within normal.  Low suspicion for infection -Continue ceftriaxone and Flagyl for now. -Pain control   History of alcohol abuse in remission: Reports that he stopped drinking alcohol approximately 9 years ago. -Congratulated.   Tobacco abuse: Reports smoking 1.5 packs of cigarettes per day on average. -Counseled on need of cessation  -Nicotine patch   Internal hemorrhoid: Felt to be source of his hematochezia. -Bowel regimen to avoid constipation once bleeding stops   Body mass index is 18.25 kg/m.          DVT prophylaxis:  SCDs Start: 05/10/22 0853  Code Status: Full code Family Communication: None at bedside Level of care: Progressive Status is: Inpatient The patient will remain inpatient because: Due to symptomatic acute blood loss anemia in the setting of acute GI bleed   Final disposition: Home Consultants:  Gastroenterology  55 minutes with more than 50% spent in reviewing records, counseling patient/family and coordinating care.   Sch Meds:  Scheduled Meds:  sodium chloride   Intravenous Once   sodium chloride   Intravenous Once   dicyclomine  20 mg Oral TID AC   nicotine  21 mg Transdermal Daily  pantoprazole (PROTONIX) IV  40 mg Intravenous Q12H   sodium chloride flush  3 mL Intravenous Q12H   Continuous Infusions:  sodium chloride 100 mL/hr at 05/11/22 1629   cefTRIAXone (ROCEPHIN)  IV 2 g (05/12/22 [id number])   ferric gluconate (FERRLECIT) IVPB 250 mg (05/12/22 1024)   metronidazole 500 mg (05/12/22 0915)   octreotide (SANDOSTATIN) 500 mcg in sodium chloride 0.9 % 250 mL (2 mcg/mL) infusion 50 mcg/hr (05/11/22 1639)   PRN Meds:.acetaminophen **OR** acetaminophen, albuterol, mouth rinse, oxyCODONE, simethicone, trimethobenzamide  Antimicrobials: Anti-infectives (From admission, onward)    Start     Dose/Rate Route Frequency Ordered Stop   05/12/22 0000   amoxicillin-clavulanate (AUGMENTIN) 875-125 MG tablet        1 tablet Oral 2 times daily 05/12/22 1009 05/15/22 2359   05/11/22 0800  cefTRIAXone (ROCEPHIN) 2 g in sodium chloride 0.9 % 100 mL IVPB        2 g 200 mL/hr over 30 Minutes Intravenous Every 24 hours 05/10/22 0851     05/10/22 0930  metroNIDAZOLE (FLAGYL) IVPB 500 mg        500 mg 100 mL/hr over 60 Minutes Intravenous Every 12 hours 05/10/22 0922     05/10/22 0430  cefTRIAXone (ROCEPHIN) 2 g in sodium chloride 0.9 % 100 mL IVPB        2 g 200 mL/hr over 30 Minutes Intravenous  Once 05/10/22 0423 05/10/22 0631   05/10/22 0415  cefTRIAXone (ROCEPHIN) injection 2 mg  Status:  Discontinued        2 mg Intramuscular  Once 05/10/22 0409 05/10/22 0423        I have personally reviewed the following labs and images: CBC: Recent Labs  Lab 05/10/22 0357 05/10/22 0406 05/10/22 0720 05/10/22 1158 05/10/22 1608 05/11/22 0419 05/11/22 1529 05/12/22 0918  WBC 9.4  --  6.3  --   --  2.5* 2.3* 1.6*  NEUTROABS 7.2  --   --   --   --   --   --  1.2*  HGB 11.4*   < > 10.3* 9.1* 10.0* 9.6* 9.9* 7.4*  HCT 34.3*   < > 30.3* 27.7* 30.5* 28.3* 29.0* 21.7*  MCV 89.1  --  88.1  --   --  87.1 86.6 88.2  PLT 94*  --  36*  --   --  37* 39* 28*   < > = values in this interval not displayed.   BMP &GFR Recent Labs  Lab 05/10/22 0357 05/10/22 0406 05/11/22 0419 05/12/22 0428  NA 139 140 139 138  K 4.8 4.9 3.7 3.7  CL 108 107 110 107  CO2 24  --  22 23  GLUCOSE 118* 115* 71 152*  BUN 12 13 10 8  $ CREATININE 1.09 1.00 0.91 0.91  CALCIUM 8.2*  --  7.8* 7.8*  MG  --   --   --  1.8  PHOS  --   --   --  3.9   Estimated Creatinine Clearance: 74.7 mL/min (by C-G formula based on SCr of 0.91 mg/dL). Liver & Pancreas: Recent Labs  Lab 05/10/22 0357 05/12/22 0428  AST 27 14*  ALT 30 19  ALKPHOS 90 61  BILITOT 0.8 0.6  PROT 5.4* 4.9*  ALBUMIN 3.0* 2.7*   No results for input(s): "LIPASE", "AMYLASE" in the last 168 hours. Recent  Labs  Lab 05/10/22 0357  AMMONIA 17   Diabetic: No results for input(s): "HGBA1C" in the last 72 hours. Recent Labs  Lab 05/12/22 0754  GLUCAP 77   Cardiac Enzymes: No results for input(s): "CKTOTAL", "CKMB", "CKMBINDEX", "TROPONINI" in the last 168 hours. No results for input(s): "PROBNP" in the last 8760 hours. Coagulation Profile: Recent Labs  Lab 05/10/22 0357 05/12/22 0428  INR 1.2 1.2   Thyroid Function Tests: No results for input(s): "TSH", "T4TOTAL", "FREET4", "T3FREE", "THYROIDAB" in the last 72 hours. Lipid Profile: No results for input(s): "CHOL", "HDL", "LDLCALC", "TRIG", "CHOLHDL", "LDLDIRECT" in the last 72 hours. Anemia Panel: Recent Labs    05/12/22 0428  VITAMINB12 314  FOLATE 14.9  FERRITIN 19*  TIBC 316  IRON 27*  RETICCTPCT 2.6   Urine analysis:    Component Value Date/Time   COLORURINE YELLOW 05/10/2022 0609   APPEARANCEUR CLEAR 05/10/2022 0609   LABSPEC >1.046 (H) 05/10/2022 0609   PHURINE 5.0 05/10/2022 0609   GLUCOSEU NEGATIVE 05/10/2022 0609   HGBUR NEGATIVE 05/10/2022 0609   BILIRUBINUR NEGATIVE 05/10/2022 0609   KETONESUR NEGATIVE 05/10/2022 0609   PROTEINUR NEGATIVE 05/10/2022 0609   UROBILINOGEN 0.2 02/25/2014 1320   NITRITE NEGATIVE 05/10/2022 0609   LEUKOCYTESUR NEGATIVE 05/10/2022 0609   Sepsis Labs: Invalid input(s): "PROCALCITONIN", "LACTICIDVEN"  Microbiology: Recent Results (from the past 240 hour(s))  Blood Culture (routine x 2)     Status: None (Preliminary result)   Collection Time: 05/10/22  4:15 AM   Specimen: BLOOD LEFT ARM  Result Value Ref Range Status   Specimen Description BLOOD LEFT ARM  Final   Special Requests   Final    BOTTLES DRAWN AEROBIC AND ANAEROBIC Blood Culture adequate volume   Culture   Final    NO GROWTH 2 DAYS Performed at [facility], [address], [place], [place] [address]    Report Status PENDING  Incomplete  Blood Culture (routine x 2)     Status: None (Preliminary result)    Collection Time: 05/10/22  5:54 AM   Specimen: BLOOD  Result Value Ref Range Status   Specimen Description BLOOD BLOOD LEFT WRIST  Final   Special Requests   Final    BOTTLES DRAWN AEROBIC AND ANAEROBIC Blood Culture adequate volume   Culture   Final    NO GROWTH 2 DAYS Performed at [facility], [place] [address] [place], [place] [address]    Report Status PENDING  Incomplete    Radiology Studies: No results found.    Adriano Bischof T. [place]  If 7PM-7AM, please contact night-coverage [email or web address] 05/12/2022, 2:33 PM

## 2022-05-12 NOTE — Plan of Care (Signed)

## 2022-05-12 NOTE — Progress Notes (Addendum)
[facility] Progress Note  CC:  GI bleed, melena/bright red hematochezia   Subjective: Dark red bowel movement yesterday which he did not disclose because he wanted to go home.  He passed a similar black dark loose bowel movement earlier this morning.  He continues to have central lower and LLQ pain.  He wants to know why his morphine IV was switched to oxycodone p.o.  No nausea or vomiting.  Wife is at the bedside.   Objective:  Vital signs in last 24 hours: Temp:  [97.8 F (36.6 C)-98 F (36.7 C)] 98 F (36.7 C) (02/18 0751) Pulse Rate:  [61-72] 72 (02/17 2000) Resp:  [15-20] 20 (02/17 2000) BP: (108-132)/(77-90) 132/90 (02/18 0751) SpO2:  [98 %-99 %] 99 % (02/17 2000) Last BM Date : 05/10/22 General: Fatigued appearing 52 year old male in no acute distress. Heart: Regular rate and rhythm, no murmurs. Pulm: Breath sounds clear throughout, decreased in the bases. Abdomen: Soft, nondistended. Moderate tenderness to the central lower and LLQ without rebound or guarding. Extremities: No edema. Neurologic:  Alert and  oriented x 4. Grossly normal neurologically. Psych:  Alert and cooperative. Normal mood and affect.  Intake/Output from previous day: 02/17 0701 - 02/18 0700 In: 350 [I.V.:350] Out: 301 [Urine:300; Stool:1] Intake/Output this shift: Total I/O In: -  Out: 700 [Urine:700]  Lab Results: Recent Labs    05/11/22 0419 05/11/22 1529 05/12/22 0918  WBC 2.5* 2.3* 1.6*  HGB 9.6* 9.9* 7.4*  HCT 28.3* 29.0* 21.7*  PLT 37* 39* 28*   BMET Recent Labs    05/10/22 0357 05/10/22 0406 05/11/22 0419 05/12/22 0428  NA 139 140 139 138  K 4.8 4.9 3.7 3.7  CL 108 107 110 107  CO2 24  --  22 23  GLUCOSE 118* 115* 71 152*  BUN 12 13 10 8  $ CREATININE 1.09 1.00 0.91 0.91  CALCIUM 8.2*  --  7.8* 7.8*   LFT Recent Labs    05/12/22 0428  PROT 4.9*  ALBUMIN 2.7*  AST 14*  ALT 19  ALKPHOS 61  BILITOT 0.6   PT/INR Recent Labs    05/10/22 0357  05/12/22 0428  LABPROT 15.5* 15.4*  INR 1.2 1.2   Hepatitis Panel No results for input(s): "HEPBSAG", "HCVAB", "HEPAIGM", "HEPBIGM" in the last 72 hours.  No results found.  Assessment / Plan:  52 year old male with alcohol associated cirrhosis (abstinent from alcohol x 7 to 8 years), portal hypertensive gastropathy, thrombocytopenia and splenomegaly presents with melena x 1 week and bright red hematochezia x 1 day seen in the ED 05/05/2022 Hg 13.9 and CTAP showed mild circumferential bowel wall thickening involving the ascending, transverse and proximal descending colon which was nonspecific, possibly underlying infectious or inflammatory colitis. Discharged home 05/06/2022 then presented to [facility] ED 2/16 with hematemesis and hypotension. Hg 11.4.  Transfused 1 unit of PRBC due to concerns for hemorrhagic shock. CTA negative for active GI bleed. Noncompliant with home meds, stopped taking carvedilol and spironolactone 01/2022. EGD 05/11/2022 showed grade 2 esophageal varices, banded and incompletely eradicated, portal hypertensive gastropathy, questionable GOV2 varices, nonbleeding gastric ulcer with no stigmata of bleeding, friable duodenal mucosa and abnormal appearing ampulla likely related to chronic pancreatitis and prior stenting.  Flex sig showed internal hemorrhoids, likely the cause of hematochezia and questionable congestion mucosa in the rectum and sigmoid colon, biopsies pending.  Patient reported passing a moderate volume of black/dark red stool yesterday and today. -Soft diet -Continue PPI p.o. twice daily -Continue  Octreotide infusion -Rocephin IV every 24 hours -Transfuse for hemoglobin less than 7                                                                      -Await EGD and flex sig biopsy results -Repeat EGD in 4 weeks with primary GI for repeat banding -Restart Propranolol 20 mg p.o. twice daily at discharge   Lower abdominal pain, CTA 05/06/2022 showed evidence of colitis  versus passive congestion secondary to mesenteric venous hypertension. Colonoscopy 01/2022 without evidence of IBD/colitis.  Flex sig 05/11/2022 showed questionable congestion mucosa in the rectum and sigmoid colon, biopsies pending. -On Flagyl 500 mg IV twice daily and Rocephin IV QD -Continue Bentyl 20 mg 3 times daily -Pain management per the hospitalist -? Repeat CTAP if abdominal pain worsens   Progressive pancytopenia secondary to cirrhosis/splenomegaly.  WBC 2.3 -> 1.6. Hg 9.9 -> 7.4. PLT 39 -> 28.  Iron 27.  Saturation ratio is 9%.  B12 314.  Received IV iron today. Normal INR. CTA showed spleen measuring 16cm. -Repeat CBC at 3pm -Consider hematology consult rule out concomitant etiology for pancytopenia  Further recommendations per Dr. Candis Schatz  Principal Problem:   Hematemesis with nausea Active Problems:   Thrombocytopenia, unspecified (HCC)   Pancytopenia ([place])   Alcoholic cirrhosis of liver without ascites (HCC)   Acute upper GI bleeding   Syncope   Abnormal finding on GI tract imaging   Hematochezia   Acute blood loss anemia   Lactic acidosis   Lower abdominal pain   Chest pain   Alcohol abuse, in remission   Tobacco abuse   Hypotension   Alcohol-induced chronic pancreatitis ([place])   Splenomegaly   Internal hemorrhoid   Acute GI bleeding     LOS: 1 day   Noralyn Pick  05/12/2022, 2:14PM

## 2022-05-13 ENCOUNTER — Encounter (HOSPITAL_COMMUNITY): Payer: Self-pay | Admitting: Gastroenterology

## 2022-05-13 ENCOUNTER — Other Ambulatory Visit: Payer: Self-pay | Admitting: Nurse Practitioner

## 2022-05-13 DIAGNOSIS — K766 Portal hypertension: Secondary | ICD-10-CM

## 2022-05-13 DIAGNOSIS — R079 Chest pain, unspecified: Secondary | ICD-10-CM | POA: Diagnosis not present

## 2022-05-13 DIAGNOSIS — R1030 Lower abdominal pain, unspecified: Secondary | ICD-10-CM | POA: Diagnosis not present

## 2022-05-13 DIAGNOSIS — K3189 Other diseases of stomach and duodenum: Secondary | ICD-10-CM

## 2022-05-13 DIAGNOSIS — D696 Thrombocytopenia, unspecified: Secondary | ICD-10-CM | POA: Diagnosis not present

## 2022-05-13 DIAGNOSIS — K920 Hematemesis: Secondary | ICD-10-CM | POA: Diagnosis not present

## 2022-05-13 LAB — CBC WITH DIFFERENTIAL/PLATELET
Abs Immature Granulocytes: 0.01 10*3/uL (ref 0.00–0.07)
Basophils Absolute: 0 10*3/uL (ref 0.0–0.1)
Basophils Relative: 1 %
Eosinophils Absolute: 0.1 10*3/uL (ref 0.0–0.5)
Eosinophils Relative: 4 %
HCT: 27.5 % — ABNORMAL LOW (ref 39.0–52.0)
Hemoglobin: 9.6 g/dL — ABNORMAL LOW (ref 13.0–17.0)
Immature Granulocytes: 1 %
Lymphocytes Relative: 12 %
Lymphs Abs: 0.2 10*3/uL — ABNORMAL LOW (ref 0.7–4.0)
MCH: 30.4 pg (ref 26.0–34.0)
MCHC: 34.9 g/dL (ref 30.0–36.0)
MCV: 87 fL (ref 80.0–100.0)
Monocytes Absolute: 0.2 10*3/uL (ref 0.1–1.0)
Monocytes Relative: 9 %
Neutro Abs: 1.5 10*3/uL — ABNORMAL LOW (ref 1.7–7.7)
Neutrophils Relative %: 73 %
Platelets: 37 10*3/uL — ABNORMAL LOW (ref 150–400)
RBC: 3.16 MIL/uL — ABNORMAL LOW (ref 4.22–5.81)
RDW: 15.1 % (ref 11.5–15.5)
WBC: 2 10*3/uL — ABNORMAL LOW (ref 4.0–10.5)
nRBC: 0 % (ref 0.0–0.2)

## 2022-05-13 LAB — COMPREHENSIVE METABOLIC PANEL
ALT: 19 U/L (ref 0–44)
AST: 19 U/L (ref 15–41)
Albumin: 2.7 g/dL — ABNORMAL LOW (ref 3.5–5.0)
Alkaline Phosphatase: 67 U/L (ref 38–126)
Anion gap: 10 (ref 5–15)
BUN: 7 mg/dL (ref 6–20)
CO2: 24 mmol/L (ref 22–32)
Calcium: 8 mg/dL — ABNORMAL LOW (ref 8.9–10.3)
Chloride: 105 mmol/L (ref 98–111)
Creatinine, Ser: 0.95 mg/dL (ref 0.61–1.24)
GFR, Estimated: >60 mL/min (ref >60)
Glucose, Bld: 107 mg/dL — ABNORMAL HIGH (ref 70–99)
Potassium: 3.6 mmol/L (ref 3.5–5.1)
Sodium: 139 mmol/L (ref 135–145)
Total Bilirubin: 0.8 mg/dL (ref 0.3–1.2)
Total Protein: 5.1 g/dL — ABNORMAL LOW (ref 6.5–8.1)

## 2022-05-13 LAB — APTT: aPTT: 41 seconds — ABNORMAL HIGH (ref 24–36)

## 2022-05-13 LAB — PROTIME-INR
INR: 1.3 — ABNORMAL HIGH (ref 0.8–1.2)
Prothrombin Time: 16.5 seconds — ABNORMAL HIGH (ref 11.4–15.2)
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
## 2022-05-13 NOTE — Progress Notes (Unsigned)
{  Select_TRH_Note:[id number]} 

## 2022-05-14 LAB — SURGICAL PATHOLOGY

## 2022-05-15 LAB — CULTURE, BLOOD (ROUTINE X 2)
Culture: NO GROWTH
Culture: NO GROWTH
Special Requests: ADEQUATE
Special Requests: ADEQUATE

## 2022-05-20 ENCOUNTER — Encounter: Payer: Self-pay | Admitting: Gastroenterology

## 2022-12-11 ENCOUNTER — Inpatient Hospital Stay (HOSPITAL_COMMUNITY)
Admission: EM | Admit: 2022-12-11 | Discharge: 2022-12-21 | DRG: 871 | Disposition: A | Discharge status: Home / Self Care | Payer: BLUE CROSS/BLUE SHIELD | Attending: Internal Medicine | Admitting: Internal Medicine

## 2022-12-11 DIAGNOSIS — Z720 Tobacco use: Secondary | ICD-10-CM | POA: Diagnosis present

## 2022-12-11 DIAGNOSIS — K760 Fatty (change of) liver, not elsewhere classified: Secondary | ICD-10-CM | POA: Diagnosis present

## 2022-12-11 DIAGNOSIS — K219 Gastro-esophageal reflux disease without esophagitis: Secondary | ICD-10-CM

## 2022-12-11 DIAGNOSIS — K831 Obstruction of bile duct: Secondary | ICD-10-CM | POA: Diagnosis present

## 2022-12-11 DIAGNOSIS — Z8249 Family history of ischemic heart disease and other diseases of the circulatory system: Secondary | ICD-10-CM

## 2022-12-11 DIAGNOSIS — A419 Sepsis, unspecified organism: Principal | ICD-10-CM | POA: Diagnosis present

## 2022-12-11 DIAGNOSIS — Z1152 Encounter for screening for COVID-19: Secondary | ICD-10-CM

## 2022-12-11 DIAGNOSIS — Z79899 Other long term (current) drug therapy: Secondary | ICD-10-CM

## 2022-12-11 DIAGNOSIS — E861 Hypovolemia: Secondary | ICD-10-CM | POA: Diagnosis present

## 2022-12-11 DIAGNOSIS — J449 Chronic obstructive pulmonary disease, unspecified: Secondary | ICD-10-CM

## 2022-12-11 DIAGNOSIS — F17210 Nicotine dependence, cigarettes, uncomplicated: Secondary | ICD-10-CM | POA: Diagnosis present

## 2022-12-11 DIAGNOSIS — E871 Hypo-osmolality and hyponatremia: Secondary | ICD-10-CM

## 2022-12-11 DIAGNOSIS — R161 Splenomegaly, not elsewhere classified: Secondary | ICD-10-CM | POA: Diagnosis present

## 2022-12-11 DIAGNOSIS — D696 Thrombocytopenia, unspecified: Secondary | ICD-10-CM | POA: Diagnosis present

## 2022-12-11 DIAGNOSIS — K861 Other chronic pancreatitis: Secondary | ICD-10-CM | POA: Diagnosis present

## 2022-12-11 DIAGNOSIS — K8689 Other specified diseases of pancreas: Secondary | ICD-10-CM | POA: Diagnosis present

## 2022-12-11 DIAGNOSIS — I998 Other disorder of circulatory system: Secondary | ICD-10-CM | POA: Diagnosis present

## 2022-12-11 DIAGNOSIS — K750 Abscess of liver: Principal | ICD-10-CM | POA: Diagnosis present

## 2022-12-11 DIAGNOSIS — K651 Peritoneal abscess: Secondary | ICD-10-CM | POA: Diagnosis present

## 2022-12-11 DIAGNOSIS — E119 Type 2 diabetes mellitus without complications: Secondary | ICD-10-CM

## 2022-12-11 DIAGNOSIS — F1011 Alcohol abuse, in remission: Secondary | ICD-10-CM | POA: Diagnosis present

## 2022-12-11 DIAGNOSIS — I81 Portal vein thrombosis: Secondary | ICD-10-CM | POA: Diagnosis present

## 2022-12-11 DIAGNOSIS — D61818 Other pancytopenia: Secondary | ICD-10-CM | POA: Diagnosis present

## 2022-12-11 DIAGNOSIS — D638 Anemia in other chronic diseases classified elsewhere: Secondary | ICD-10-CM | POA: Diagnosis present

## 2022-12-11 DIAGNOSIS — E1165 Type 2 diabetes mellitus with hyperglycemia: Secondary | ICD-10-CM | POA: Diagnosis present

## 2022-12-11 DIAGNOSIS — Z7984 Long term (current) use of oral hypoglycemic drugs: Secondary | ICD-10-CM

## 2022-12-11 DIAGNOSIS — R6520 Severe sepsis without septic shock: Secondary | ICD-10-CM | POA: Diagnosis present

## 2022-12-11 DIAGNOSIS — K5900 Constipation, unspecified: Secondary | ICD-10-CM

## 2022-12-11 DIAGNOSIS — Z7951 Long term (current) use of inhaled steroids: Secondary | ICD-10-CM

## 2022-12-11 DIAGNOSIS — I10 Essential (primary) hypertension: Secondary | ICD-10-CM | POA: Diagnosis present

## 2022-12-11 DIAGNOSIS — K7031 Alcoholic cirrhosis of liver with ascites: Secondary | ICD-10-CM | POA: Diagnosis not present

## 2022-12-11 DIAGNOSIS — J9811 Atelectasis: Secondary | ICD-10-CM | POA: Diagnosis present

## 2022-12-11 DIAGNOSIS — F39 Unspecified mood [affective] disorder: Secondary | ICD-10-CM | POA: Diagnosis present

## 2022-12-11 DIAGNOSIS — K766 Portal hypertension: Secondary | ICD-10-CM | POA: Diagnosis present

## 2022-12-12 ENCOUNTER — Other Ambulatory Visit: Payer: Self-pay

## 2022-12-12 ENCOUNTER — Emergency Department (HOSPITAL_COMMUNITY): Payer: BLUE CROSS/BLUE SHIELD

## 2022-12-12 DIAGNOSIS — D61818 Other pancytopenia: Secondary | ICD-10-CM | POA: Diagnosis present

## 2022-12-12 DIAGNOSIS — K5900 Constipation, unspecified: Secondary | ICD-10-CM | POA: Diagnosis not present

## 2022-12-12 DIAGNOSIS — Z7984 Long term (current) use of oral hypoglycemic drugs: Secondary | ICD-10-CM | POA: Diagnosis not present

## 2022-12-12 DIAGNOSIS — R6520 Severe sepsis without septic shock: Secondary | ICD-10-CM | POA: Diagnosis present

## 2022-12-12 DIAGNOSIS — D638 Anemia in other chronic diseases classified elsewhere: Secondary | ICD-10-CM | POA: Diagnosis present

## 2022-12-12 DIAGNOSIS — Z8249 Family history of ischemic heart disease and other diseases of the circulatory system: Secondary | ICD-10-CM | POA: Diagnosis not present

## 2022-12-12 DIAGNOSIS — A419 Sepsis, unspecified organism: Secondary | ICD-10-CM | POA: Diagnosis present

## 2022-12-12 DIAGNOSIS — E1165 Type 2 diabetes mellitus with hyperglycemia: Secondary | ICD-10-CM | POA: Diagnosis present

## 2022-12-12 DIAGNOSIS — J449 Chronic obstructive pulmonary disease, unspecified: Secondary | ICD-10-CM | POA: Diagnosis present

## 2022-12-12 DIAGNOSIS — J9811 Atelectasis: Secondary | ICD-10-CM | POA: Diagnosis present

## 2022-12-12 DIAGNOSIS — F17210 Nicotine dependence, cigarettes, uncomplicated: Secondary | ICD-10-CM | POA: Diagnosis present

## 2022-12-12 DIAGNOSIS — K651 Peritoneal abscess: Secondary | ICD-10-CM | POA: Diagnosis present

## 2022-12-12 DIAGNOSIS — F1011 Alcohol abuse, in remission: Secondary | ICD-10-CM | POA: Diagnosis present

## 2022-12-12 DIAGNOSIS — K219 Gastro-esophageal reflux disease without esophagitis: Secondary | ICD-10-CM | POA: Diagnosis present

## 2022-12-12 DIAGNOSIS — A408 Other streptococcal sepsis: Secondary | ICD-10-CM | POA: Diagnosis not present

## 2022-12-12 DIAGNOSIS — E871 Hypo-osmolality and hyponatremia: Secondary | ICD-10-CM | POA: Diagnosis present

## 2022-12-12 DIAGNOSIS — E861 Hypovolemia: Secondary | ICD-10-CM | POA: Diagnosis present

## 2022-12-12 DIAGNOSIS — I81 Portal vein thrombosis: Secondary | ICD-10-CM | POA: Diagnosis present

## 2022-12-12 DIAGNOSIS — D696 Thrombocytopenia, unspecified: Secondary | ICD-10-CM | POA: Diagnosis not present

## 2022-12-12 DIAGNOSIS — K766 Portal hypertension: Secondary | ICD-10-CM | POA: Diagnosis present

## 2022-12-12 DIAGNOSIS — Z1152 Encounter for screening for COVID-19: Secondary | ICD-10-CM | POA: Diagnosis not present

## 2022-12-12 DIAGNOSIS — K861 Other chronic pancreatitis: Secondary | ICD-10-CM | POA: Diagnosis present

## 2022-12-12 DIAGNOSIS — K760 Fatty (change of) liver, not elsewhere classified: Secondary | ICD-10-CM | POA: Diagnosis present

## 2022-12-12 DIAGNOSIS — K750 Abscess of liver: Secondary | ICD-10-CM | POA: Diagnosis present

## 2022-12-12 DIAGNOSIS — F39 Unspecified mood [affective] disorder: Secondary | ICD-10-CM | POA: Diagnosis present

## 2022-12-12 DIAGNOSIS — K7031 Alcoholic cirrhosis of liver with ascites: Secondary | ICD-10-CM | POA: Diagnosis not present

## 2022-12-12 DIAGNOSIS — I10 Essential (primary) hypertension: Secondary | ICD-10-CM | POA: Diagnosis present

## 2022-12-12 LAB — CBC
HCT: 30.7 % — ABNORMAL LOW (ref 39.0–52.0)
Hemoglobin: 10.4 g/dL — ABNORMAL LOW (ref 13.0–17.0)
MCH: 29.3 pg (ref 26.0–34.0)
MCHC: 33.9 g/dL (ref 30.0–36.0)
MCV: 86.5 fL (ref 80.0–100.0)
Platelets: 80 10*3/uL — ABNORMAL LOW (ref 150–400)
RBC: 3.55 MIL/uL — ABNORMAL LOW (ref 4.22–5.81)
RDW: 13.8 % (ref 11.5–15.5)
WBC: 16 10*3/uL — ABNORMAL HIGH (ref 4.0–10.5)
nRBC: 0 % (ref 0.0–0.2)

## 2022-12-12 LAB — COMPREHENSIVE METABOLIC PANEL
ALT: 34 U/L (ref 0–44)
AST: 20 U/L (ref 15–41)
Albumin: 3 g/dL — ABNORMAL LOW (ref 3.5–5.0)
Alkaline Phosphatase: 407 U/L — ABNORMAL HIGH (ref 38–126)
Anion gap: 9 (ref 5–15)
BUN: 20 mg/dL (ref 6–20)
CO2: 24 mmol/L (ref 22–32)
Calcium: 8.5 mg/dL — ABNORMAL LOW (ref 8.9–10.3)
Chloride: 92 mmol/L — ABNORMAL LOW (ref 98–111)
Creatinine, Ser: 1.02 mg/dL (ref 0.61–1.24)
GFR, Estimated: >60 mL/min (ref >60)
Glucose, Bld: 225 mg/dL — ABNORMAL HIGH (ref 70–99)
Potassium: 4.1 mmol/L (ref 3.5–5.1)
Sodium: 125 mmol/L — ABNORMAL LOW (ref 135–145)
Total Bilirubin: 2.5 mg/dL — ABNORMAL HIGH (ref 0.3–1.2)
Total Protein: 7.6 g/dL (ref 6.5–8.1)

## 2022-12-12 LAB — HEMOGLOBIN A1C
Hgb A1c MFr Bld: 8.4 % — ABNORMAL HIGH (ref 4.8–5.6)
Mean Plasma Glucose: 194.38 mg/dL

## 2022-12-12 LAB — SARS CORONAVIRUS 2 BY RT PCR: SARS Coronavirus 2 by RT PCR: NEGATIVE

## 2022-12-12 LAB — URINALYSIS, W/ REFLEX TO CULTURE (INFECTION SUSPECTED)
Bacteria, UA: NONE SEEN
Bilirubin Urine: NEGATIVE
Glucose, UA: >=500 mg/dL — AB
Hgb urine dipstick: NEGATIVE
Ketones, ur: 5 mg/dL — AB
Leukocytes,Ua: NEGATIVE
Nitrite: NEGATIVE
Protein, ur: 30 mg/dL — AB
Specific Gravity, Urine: 1.017 (ref 1.005–1.030)
pH: 5 (ref 5.0–8.0)

## 2022-12-12 LAB — CBC WITH DIFFERENTIAL/PLATELET
Abs Immature Granulocytes: 0.16 10*3/uL — ABNORMAL HIGH (ref 0.00–0.07)
Basophils Absolute: 0 10*3/uL (ref 0.0–0.1)
Basophils Relative: 0 %
Eosinophils Absolute: 0 10*3/uL (ref 0.0–0.5)
Eosinophils Relative: 0 %
HCT: 34.3 % — ABNORMAL LOW (ref 39.0–52.0)
Hemoglobin: 11.7 g/dL — ABNORMAL LOW (ref 13.0–17.0)
Immature Granulocytes: 1 %
Lymphocytes Relative: 3 %
Lymphs Abs: 0.4 10*3/uL — ABNORMAL LOW (ref 0.7–4.0)
MCH: 29.3 pg (ref 26.0–34.0)
MCHC: 34.1 g/dL (ref 30.0–36.0)
MCV: 86 fL (ref 80.0–100.0)
Monocytes Absolute: 0.9 10*3/uL (ref 0.1–1.0)
Monocytes Relative: 6 %
Neutro Abs: 14.7 10*3/uL — ABNORMAL HIGH (ref 1.7–7.7)
Neutrophils Relative %: 90 %
Platelets: 105 10*3/uL — ABNORMAL LOW (ref 150–400)
RBC: 3.99 MIL/uL — ABNORMAL LOW (ref 4.22–5.81)
RDW: 13.9 % (ref 11.5–15.5)
WBC: 16.1 10*3/uL — ABNORMAL HIGH (ref 4.0–10.5)
nRBC: 0 % (ref 0.0–0.2)

## 2022-12-12 LAB — OSMOLALITY, URINE: Osmolality, Ur: 734 mOsm/kg (ref 300–900)

## 2022-12-12 LAB — CREATININE, SERUM
Creatinine, Ser: 0.86 mg/dL (ref 0.61–1.24)
GFR, Estimated: >60 mL/min (ref >60)

## 2022-12-12 LAB — SODIUM, URINE, RANDOM: Sodium, Ur: 133 mmol/L

## 2022-12-12 LAB — I-STAT CG4 LACTIC ACID, ED
Lactic Acid, Venous: 1.1 mmol/L (ref 0.5–1.9)
Lactic Acid, Venous: 1.5 mmol/L (ref 0.5–1.9)

## 2022-12-12 LAB — CBG MONITORING, ED: Glucose-Capillary: 233 mg/dL — ABNORMAL HIGH (ref 70–99)

## 2022-12-12 LAB — LIPASE, BLOOD: Lipase: 19 U/L (ref 11–51)

## 2022-12-12 LAB — MAGNESIUM: Magnesium: 1.7 mg/dL (ref 1.7–2.4)

## 2022-12-12 MED ORDER — HYDROMORPHONE HCL 1 MG/ML IJ SOLN
1.0000 mg | Freq: Once | INTRAMUSCULAR | Status: AC
  Administered 2022-12-12: 1 mg via INTRAVENOUS
  Filled 2022-12-12: qty 1

## 2022-12-12 MED ORDER — SODIUM CHLORIDE 0.9 % IV BOLUS
20.0000 mL/kg | Freq: Once | INTRAVENOUS | Status: AC
  Administered 2022-12-12: 1088 mL via INTRAVENOUS

## 2022-12-12 MED ORDER — PROPRANOLOL HCL 20 MG PO TABS: 20.0000 mg | ORAL_TABLET | Freq: Two times a day (BID) | ORAL | Status: DC

## 2022-12-12 MED ORDER — ONDANSETRON HCL 4 MG/2ML IJ SOLN
4.0000 mg | Freq: Once | INTRAMUSCULAR | Status: AC
  Administered 2022-12-12: 4 mg via INTRAVENOUS
  Filled 2022-12-12: qty 2

## 2022-12-12 MED ORDER — ONDANSETRON HCL 4 MG PO TABS: 4.0000 mg | ORAL_TABLET | Freq: Four times a day (QID) | ORAL | Status: DC | PRN

## 2022-12-12 MED ORDER — NICOTINE 21 MG/24HR TD PT24: 21.0000 mg | MEDICATED_PATCH | Freq: Every day | TRANSDERMAL | Status: DC

## 2022-12-12 MED ORDER — ACETAMINOPHEN 325 MG PO TABS
650.0000 mg | ORAL_TABLET | Freq: Once | ORAL | Status: AC | PRN
  Administered 2022-12-12: 650 mg via ORAL
  Filled 2022-12-12: qty 2

## 2022-12-12 MED ORDER — NICOTINE 14 MG/24HR TD PT24
14.0000 mg | MEDICATED_PATCH | Freq: Every day | TRANSDERMAL | Status: DC
  Administered 2022-12-13 – 2022-12-21 (×8): 14 mg via TRANSDERMAL
  Filled 2022-12-12 (×9): qty 1

## 2022-12-12 MED ORDER — ENOXAPARIN SODIUM 40 MG/0.4ML IJ SOSY
40.0000 mg | PREFILLED_SYRINGE | Freq: Every day | INTRAMUSCULAR | Status: DC
  Administered 2022-12-12 – 2022-12-18 (×6): 40 mg via SUBCUTANEOUS
  Filled 2022-12-12 (×6): qty 0.4

## 2022-12-12 MED ORDER — HYDROCODONE-ACETAMINOPHEN 7.5-325 MG PO TABS
1.0000 | ORAL_TABLET | Freq: Four times a day (QID) | ORAL | Status: DC | PRN
  Administered 2022-12-12 – 2022-12-15 (×3): 1 via ORAL
  Filled 2022-12-12 (×3): qty 1

## 2022-12-12 MED ORDER — LACTATED RINGERS IV SOLN
150.0000 mL/h | INTRAVENOUS | Status: AC
  Administered 2022-12-12 – 2022-12-13 (×2): 150 mL/h via INTRAVENOUS

## 2022-12-12 MED ORDER — ACETAMINOPHEN 325 MG PO TABS
650.0000 mg | ORAL_TABLET | Freq: Three times a day (TID) | ORAL | Status: DC | PRN
  Administered 2022-12-12 – 2022-12-14 (×3): 650 mg via ORAL
  Filled 2022-12-12 (×4): qty 2

## 2022-12-12 MED ORDER — SODIUM CHLORIDE 0.9 % IV BOLUS
1000.0000 mL | Freq: Once | INTRAVENOUS | Status: AC
  Administered 2022-12-12: 1000 mL via INTRAVENOUS

## 2022-12-12 MED ORDER — PIPERACILLIN-TAZOBACTAM 3.375 G IVPB 30 MIN
3.3750 g | Freq: Once | INTRAVENOUS | Status: AC
  Administered 2022-12-12: 3.375 g via INTRAVENOUS
  Filled 2022-12-12: qty 50

## 2022-12-12 MED ORDER — ACETAMINOPHEN 325 MG PO TABS
650.0000 mg | ORAL_TABLET | Freq: Once | ORAL | Status: AC
  Administered 2022-12-12: 650 mg via ORAL
  Filled 2022-12-12: qty 2

## 2022-12-12 MED ORDER — IOHEXOL 300 MG/ML  SOLN
100.0000 mL | Freq: Once | INTRAMUSCULAR | Status: AC | PRN
  Administered 2022-12-12: 100 mL via INTRAVENOUS

## 2022-12-12 MED ORDER — SODIUM CHLORIDE 0.9 % IV SOLN
1000.0000 mL | INTRAVENOUS | Status: DC
  Administered 2022-12-12: 1000 mL via INTRAVENOUS

## 2022-12-12 MED ORDER — PIPERACILLIN-TAZOBACTAM 3.375 G IVPB
3.3750 g | Freq: Three times a day (TID) | INTRAVENOUS | Status: DC
  Administered 2022-12-12 – 2022-12-16 (×12): 3.375 g via INTRAVENOUS
  Filled 2022-12-12 (×12): qty 50

## 2022-12-12 MED ORDER — ONDANSETRON HCL 4 MG/2ML IJ SOLN: 4.0000 mg | Freq: Four times a day (QID) | INTRAMUSCULAR | Status: DC | PRN

## 2022-12-12 MED ORDER — SODIUM CHLORIDE 0.9 % IV BOLUS (SEPSIS)
1000.0000 mL | Freq: Once | INTRAVENOUS | Status: AC
  Administered 2022-12-12: 1000 mL via INTRAVENOUS

## 2022-12-12 MED ORDER — ACETAMINOPHEN 650 MG RE SUPP: 650.0000 mg | Freq: Four times a day (QID) | RECTAL | Status: DC | PRN

## 2022-12-12 NOTE — ED Notes (Signed)
Pt noted to be trending downward with his BP. Admitting MD was paged for new orders to be placed.

## 2022-12-12 NOTE — ED Notes (Signed)
Pt stepped out when he was called back to room.

## 2022-12-12 NOTE — ED Notes (Signed)
Pt is requesting pain medication and a recheck of his blood sugar. Triage RN notified.

## 2022-12-12 NOTE — ED Provider Notes (Signed)
[place] EMERGENCY DEPARTMENT AT [facility] Provider Note   CSN: [id number] Arrival date & time: 12/11/22  2325     History  Chief Complaint  Patient presents with   Abdominal Pain   Fever    Lawrence Brock is a 52 y.o. male.   Abdominal Pain Associated symptoms: fever   Fever    Patient has a history of chronic pancreatitis hypertension pneumonia thrombocytopenia cholangitis portal vein thrombosis liver abscess who presents to the ED with complaints of abdominal pain and fever.  Patient states he started having symptoms about 4 to 5 days ago.  He has been having fevers.  He started having pain in his right upper abdomen.  Patient feels like it is his gallbladder acting up.  He has had problems in the past.  Previous records indicate history of choledocholithiasis.  Patient also was treated for cholangitis.  He does have a history of alcoholic cirrhosis but does not drink alcohol any longer.  He was not felt to be a surgical candidate in the past.  Patient states he is not having any vomiting.  He denies any diarrhea.  He has had some decreased urination  Home Medications Prior to Admission medications   Medication Sig Start Date End Date Taking? Authorizing Provider  dicyclomine (BENTYL) 20 MG tablet Take 20 mg by mouth in the morning and at bedtime.    [provider]  HYDROcodone-acetaminophen (NORCO) 7.5-325 MG tablet Take 1 tablet by mouth every 6 (six) hours as needed for moderate pain.    [provider]  nicotine (NICODERM CQ - DOSED IN MG/24 HOURS) 21 mg/24hr patch Place 1 patch (21 mg total) onto the skin daily. 05/12/22   Almon Hercules, MD  pantoprazole (PROTONIX) 40 MG tablet Take 1 tablet (40 mg total) by mouth 2 (two) times daily for 30 days, THEN 1 tablet (40 mg total) daily. 05/12/22 08/10/22  Almon Hercules, MD  polyethylene glycol powder (MIRALAX) 17 GM/SCOOP powder Take 17 g by mouth 2 (two) times daily as needed for moderate constipation  or mild constipation. 05/12/22   Almon Hercules, MD  propranolol (INDERAL) 20 MG tablet Take 1 tablet (20 mg total) by mouth 2 (two) times daily. 05/12/22 08/10/22  Almon Hercules, MD      Allergies    Patient has no known allergies.    Review of Systems   Review of Systems  Constitutional:  Positive for fever.  Gastrointestinal:  Positive for abdominal pain.    Physical Exam Updated Vital Signs BP 101/77   Pulse (!) 104   Temp (!) 100.6 F (38.1 C) (Oral)   Resp 16   Ht 1.727 m (5\' 8" )   Wt 54.4 kg   SpO2 96%   BMI 18.25 kg/m  Physical Exam Vitals and nursing note reviewed.  Constitutional:      Appearance: He is well-developed. He is ill-appearing.  HENT:     Head: Normocephalic and atraumatic.     Right Ear: External ear normal.     Left Ear: External ear normal.  Eyes:     General: No scleral icterus.       Right eye: No discharge.        Left eye: No discharge.     Conjunctiva/sclera: Conjunctivae normal.  Neck:     Trachea: No tracheal deviation.  Cardiovascular:     Rate and Rhythm: Normal rate and regular rhythm.  Pulmonary:     Effort: Pulmonary effort is  normal. No respiratory distress.     Breath sounds: Normal breath sounds. No stridor. No wheezing or rales.  Abdominal:     General: Bowel sounds are normal. There is no distension.     Palpations: Abdomen is soft.     Tenderness: There is abdominal tenderness in the right upper quadrant. There is no guarding or rebound.  Musculoskeletal:        General: No tenderness or deformity.     Cervical back: Neck supple.  Skin:    General: Skin is warm and dry.     Findings: No rash.  Neurological:     General: No focal deficit present.     Mental Status: He is alert.     Cranial Nerves: No cranial nerve deficit, dysarthria or facial asymmetry.     Sensory: No sensory deficit.     Motor: No abnormal muscle tone or seizure activity.     Coordination: Coordination normal.  Psychiatric:        Mood and Affect:  Mood normal.     ED Results / Procedures / Treatments   Labs (all labs ordered are listed, but only abnormal results are displayed) Labs Reviewed  COMPREHENSIVE METABOLIC PANEL - Abnormal; Notable for the following components:      Result Value   Sodium 125 (*)    Chloride 92 (*)    Glucose, Bld 225 (*)    Calcium 8.5 (*)    Albumin 3.0 (*)    Alkaline Phosphatase 407 (*)    Total Bilirubin 2.5 (*)    All other components within normal limits  CBC WITH DIFFERENTIAL/PLATELET - Abnormal; Notable for the following components:   WBC 16.1 (*)    RBC 3.99 (*)    Hemoglobin 11.7 (*)    HCT 34.3 (*)    Platelets 105 (*)    Neutro Abs 14.7 (*)    Lymphs Abs 0.4 (*)    Abs Immature Granulocytes 0.16 (*)    All other components within normal limits  URINALYSIS, W/ REFLEX TO CULTURE (INFECTION SUSPECTED) - Abnormal; Notable for the following components:   Glucose, UA >=500 (*)    Ketones, ur 5 (*)    Protein, ur 30 (*)    All other components within normal limits  CBG MONITORING, ED - Abnormal; Notable for the following components:   Glucose-Capillary 233 (*)    All other components within normal limits  SARS CORONAVIRUS 2 BY RT PCR  CULTURE, BLOOD (ROUTINE X 2)  CULTURE, BLOOD (ROUTINE X 2)  LIPASE, BLOOD  I-STAT CG4 LACTIC ACID, ED  I-STAT CG4 LACTIC ACID, ED    EKG None  Radiology CT ABDOMEN PELVIS W CONTRAST  Result Date: 12/12/2022 CLINICAL DATA:  History of mass-forming chronic pancreatitis with three day history of right upper abdominal pain associated with fever EXAM: CT ABDOMEN AND PELVIS WITH CONTRAST TECHNIQUE: Multidetector CT imaging of the abdomen and pelvis was performed using the standard protocol following bolus administration of intravenous contrast. RADIATION DOSE REDUCTION: This exam was performed according to the departmental dose-optimization program which includes automated exposure control, adjustment of the mA and/or kV according to patient size and/or  use of iterative reconstruction technique. CONTRAST:  OMNIPAQUE IOHEXOL 300 MG/ML  SOLN COMPARISON:  MR abdomen dated 10/15/2022, CT abdomen and pelvis dated 09/19/2022, CTA abdomen and pelvis dated 05/10/2022 FINDINGS: Lower chest: No focal consolidation or pulmonary nodule in the lung bases. No pleural effusion or pneumothorax demonstrated. Partially imaged heart size is normal.  Hepatobiliary: Interval development of multiloculated cystic focus centered within segment 7 measuring 4.5 x 3.4 x 7.6 cm (2:15, 7:88) With surrounding parenchymal hyperenhancement. Persistent intra hepatic bile duct dilation to the level of the portal confluence, where long segment narrowing of the common bile duct is again seen. Normal gallbladder. Pancreas: Sequela of chronic pancreatitis with diffuse parenchymal atrophy and bulky stone in the region of the pancreatic head. Spleen: Similar enlargement, 17.5 cm in the AP dimension. Adrenals/Urinary Tract: No adrenal nodules. No suspicious renal mass, calculi or hydronephrosis. Bilateral simple cysts. No specific follow-up imaging recommended. No focal bladder wall thickening. Stomach/Bowel: Normal appearance of the stomach. No evidence of bowel wall thickening, distention, or inflammatory changes. Appendix is not discretely seen. 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 Aortic atherosclerosis. No enlarged abdominal or pelvic lymph nodes. Reproductive: Prostate is unremarkable. Other: No free fluid, fluid collection, or free air. Musculoskeletal: No acute or abnormal lytic or blastic osseous lesions. IMPRESSION: 1. Interval development of multiloculated cystic focus centered within segment 7 measuring 4.5 x 3.4 x 7.6 cm with surrounding parenchymal hyperenhancement, suspicious for hepatic abscess. 2. Persistent intra hepatic bile duct dilation to the level of the portal confluence, where long  segment narrowing of the common bile duct is again seen. 3. Sequela of chronic pancreatitis with diffuse parenchymal atrophy and bulky stone in the region of the pancreatic head. 4. Similar splenomegaly. 5. 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 6.  Aortic Atherosclerosis (ICD10-I70.0). Electronically Signed   By: Agustin Cree M.D.   On: 12/12/2022 10:39   US Abdomen Complete  Result Date: 12/12/2022 CLINICAL DATA:  Upper abdominal pain EXAM: ABDOMEN ULTRASOUND COMPLETE COMPARISON:  CT abdomen and pelvis 08/19/2016 FINDINGS: Gallbladder: No gallstones or wall thickening visualized. No sonographic Murphy sign noted by sonographer. Common bile duct: Diameter: 3.7 mm Liver: No focal lesion identified. Within normal limits in parenchymal echogenicity. Portal vein is patent on color Doppler imaging with normal direction of blood flow towards the liver. IVC: No abnormality visualized. Pancreas: Visualized portion unremarkable. Spleen: Size and appearance within normal limits. Right Kidney: Length: 9.6 cm. Echogenicity within normal limits. No mass or hydronephrosis visualized. Left Kidney: Length: 10.4 cm. Echogenicity within normal limits. No mass or hydronephrosis visualized. There is a 2.5 cm simple cyst left kidney. No imaging follow-up needed. Abdominal aorta: No aneurysm visualized. Other findings: None. IMPRESSION: No cholelithiasis or sonographic evidence for acute cholecystitis. Electronically Signed   By: Annia Belt M.D.   On: 12/12/2022 08:06    Procedures Procedures    Medications Ordered in ED Medications  sodium chloride 0.9 % bolus 1,000 mL (1,000 mLs Intravenous New Bag/Given 12/12/22 0808)    Followed by  0.9 %  sodium chloride infusion (1,000 mLs Intravenous New Bag/Given 12/12/22 1037)  acetaminophen (TYLENOL) tablet 650 mg (650 mg Oral Given 12/12/22 0034)  HYDROmorphone (DILAUDID) injection 1 mg (1 mg Intravenous Given 12/12/22  0806)  ondansetron (ZOFRAN) injection 4 mg (4 mg Intravenous Given 12/12/22 0805)  piperacillin-tazobactam (ZOSYN) IVPB 3.375 g (3.375 g Intravenous New Bag/Given 12/12/22 0808)  iohexol (OMNIPAQUE) 300 MG/ML solution 100 mL (100 mLs Intravenous Contrast Given 12/12/22 0921)  acetaminophen (TYLENOL) tablet 650 mg (650 mg Oral Given 12/12/22 1034)    ED Course/ Medical Decision Making/ A&P Clinical Course as of 12/12/22 1922  Thu Dec 12, 2022  0830 Ultrasound not show evidence of gallstones.  No evidence of cholecystitis [JK]  0830 Bilirubin elevated compared to previous.  Leukocytosis noted.  Lactic acid level normal. [JK]  1027 Patient noted to have recurrent fevers [JK]  1108 CT scan shows multiloculated cystic lesion in the liver concerning for hepatic abscess.  Persistent intrahepatic bile duct dilatation noted [JK]  1130 Case discussed with Dr. Elnoria Howard.  He will see the patient in consultation [JK]  1147 Case discussed with Dr Jacqulyn Bath regarding admission [JK]    Clinical Course User Index [JK] Linwood Dibbles, MD                                 Medical Decision Making Differential diagnosis includes but not limited to hepatitis pancreatitis cholecystitis cholangitis  Problems Addressed: Hyponatremia: acute illness or injury that poses a threat to life or bodily functions Liver abscess: acute illness or injury that poses a threat to life or bodily functions  Amount and/or Complexity of Data Reviewed Labs: ordered. Decision-making details documented in ED Course. Radiology: ordered and independent interpretation performed.  Risk OTC drugs. Prescription drug management. Decision regarding hospitalization.   Patient presented to the ED for evaluation of fever and abdominal pain.  Patient has complicated history of chronic liver disease, biliary disease, pancreatitis.  Patient primarily complained of pain in his upper abdomen.  Patient's laboratory test notable for leukocytosis but no  lactic acidosis.  Patient however had recurrent fevers in the ED.  Due to the possibility of cholangitis there were no evidence of ductal dilatation on the CT scan or ultrasound.  CT however does demonstrate findings concerning for liver abscess that would account for his abdominal pain and fever.  Patient has been started on IV antibiotics.  I have consulted with gastroenterology.  I will consult the medical service for admission and further treatment        Final Clinical Impression(s) / ED Diagnoses Final diagnoses:  Liver abscess  Hyponatremia    Rx / DC Orders ED Discharge Orders     None         Linwood Dibbles, MD 12/12/22 Ernestina [place]

## 2022-12-12 NOTE — ED Notes (Signed)
Pt not in room or lobby

## 2022-12-12 NOTE — H&P (Signed)
History and Physical    Lawrence Brock GMW:[id number] DOB: 08-04-1970 DOA: 12/11/2022  PCP: Verlon Au, MD  Patient coming from: Home  I have personally briefly reviewed patient's old medical records in [facility]  Chief Complaint: Abdominal pain  HPI: Lawrence Brock is a 52 y.o. 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  Per patient, he started having right upper quadrant abdominal pain since 4 to 5 days, it is intermittent, crampy, aggravated with food, relieved with rest and not eating and has been anywhere from 7-10 out of 10 in intensity.  He is also having intermittent fever at home but denies any nausea, vomiting, diarrhea, constipation, any problem with urination, chills, sweating or any problem with bowel movements.  ED Course: Patient was febrile with Tmax of 101.2 and tachycardic in the ED with leukocytosis.  Typically he has leukopenia.  He currently has thrombocytopenia which is at baseline.  Renal function at baseline.  Sodium 125.  Glucose 225.  Ultrasound abdomen negative for cholelithiasis or cholecystitis.  CT abdomen and pelvis with contrast hepatic abscess, hepatic bile duct dilatation and chronic pancreatitis.  Patient received 1 L of IV fluid bolus in the ED along with pain medication and a dose of Zosyn.  EDP discussed with Dr. Bernarda Caffey who is going to see in consultation.  Hospitalist were called for admission.  Review of Systems: As per HPI otherwise negative.    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 pseudocyst in 08/2013. Follwed  by Dr Rob Bunting and Rise Mu (at [facility]).     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    Hypertension    No meds yet.   Liver abscess 01/2014   Pneumonia    10-30-14 [facility] hospital admission, no problems now.   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    Thrombocytopenia (HCC) 2015   with splenomegaly 03/2014.  s/p bone marrow biopsy, nonspecific findings 09/2015    Past Surgical History:  Procedure Laterality Date   APPENDECTOMY     BIOPSY  05/11/2022   Procedure: BIOPSY;  Surgeon: Jenel Lucks, MD;  Location: [facility] ENDOSCOPY;  Service: Gastroenterology;;   COLONOSCOPY WITH PROPOFOL N/A 01/05/2015   Rachael Fee, MD;  Thre 7 to 13 mm tubular adenomatous polyps removed. repeat colonoscopy suggested in 12/2017.     ENDOSCOPIC RETROGRADE CHOLANGIOPANCREATOGRAPHY (ERCP) WITH PROPOFOL N/A 06/02/2014   Dr Rob Bunting.  removed covered metal stent.  no stricture noted.  Rec: biliary diversion surgery if recurrent stricture arises.   ERCP N/A 02/22/2014   Meryl Dare, MD; with removal of plastic stent, replaced with metal biliary stent.  filling defect: stone, sludge, blood clots removed with baloon sweep.     ERCP N/A 02/18/2014   For suspected cholangitis. Louis Meckel, MD; s/p sphincterotomy, blood clots and sludge swept out, remnant of pancreatic duct noted (removal not mentioned.  plastic stent placed into CBD.     ERCP, plastic pancreatic duct stent placement.  2007  Dr Danny Lawless at [facility].    ESOPHAGEAL BANDING  05/11/2022   Procedure: ESOPHAGEAL BANDING;  Surgeon: Jenel Lucks, MD;  Location: [facility] ENDOSCOPY;  Service: Gastroenterology;;   ESOPHAGOGASTRODUODENOSCOPY N/A 05/11/2022   Procedure: ESOPHAGOGASTRODUODENOSCOPY (EGD);  Surgeon: Jenel Lucks, MD;  Location: [facility] ENDOSCOPY;  Service: Gastroenterology;  Laterality: N/A;   ESOPHAGOGASTRODUODENOSCOPY (EGD) WITH  PROPOFOL N/A 09/14/2013   Dr Rhea Belton.  erosive gastritis.  bulbar ulcer.  removal of plastic biliaty stent.     ESOPHAGOGASTRODUODENOSCOPY (EGD) WITH PROPOFOL N/A 01/05/2015   for melena. Rachael Fee.  Large distal esophageal varices, no bleeding stigmata.  no gastric varices, not banded.  Moderate to severe portal hypertensive gastropathy.  Nadolol initiated.    ESOPHAGOGASTRODUODENOSCOPY (EGD) WITH PROPOFOL N/A 08/19/2016   Armbruster, Reeves Forth, MD; Large esophageal varices with red whale signs, high risk for bleeding. 11 bands placed.  Portal hypertensive gastropathy.    FLEXIBLE SIGMOIDOSCOPY N/A 05/11/2022   Procedure: FLEXIBLE SIGMOIDOSCOPY;  Surgeon: Jenel Lucks, MD;  Location: [facility] ENDOSCOPY;  Service: Gastroenterology;  Laterality: N/A;   GASTROINTESTINAL STENT REMOVAL N/A 09/14/2013   Beverley Fiedler, MD; Pancreatic stent removal     reports that he has been smoking cigarettes. He has never used smokeless tobacco. He reports that he does not drink alcohol and does not use drugs.  No Known Allergies  Family History  Problem Relation Age of Onset   Hypertension Mother     Prior to Admission medications   Medication Sig Start Date End Date Taking? Authorizing Provider  dicyclomine (BENTYL) 20 MG tablet Take 20 mg by mouth in the morning and at bedtime.    [provider]  HYDROcodone-acetaminophen (NORCO) 7.5-325 MG tablet Take 1 tablet by mouth every 6 (six) hours as needed for moderate pain.    [provider]  nicotine (NICODERM CQ - DOSED IN MG/24 HOURS) 21 mg/24hr patch Place 1 patch (21 mg total) onto the skin daily. 05/12/22   Almon Hercules, MD  pantoprazole (PROTONIX) 40 MG tablet Take 1 tablet (40 mg total) by mouth 2 (two) times daily for 30 days, THEN 1 tablet (40 mg total) daily. 05/12/22 08/10/22  Almon Hercules, MD  polyethylene glycol powder (MIRALAX) 17 GM/SCOOP powder Take 17 g by mouth 2 (two) times daily as needed for moderate constipation or  mild constipation. 05/12/22   Almon Hercules, MD  propranolol (INDERAL) 20 MG tablet Take 1 tablet (20 mg total) by mouth 2 (two) times daily. 05/12/22 08/10/22  Almon Hercules, MD    Physical Exam: Vitals:   12/12/22 0300 12/12/22 0720 12/12/22 1023 12/12/22 1100  BP: 105/80 113/80 (!) 118/106 101/77  Pulse: 96 (!) 114 (!) 113 (!) 104  Resp: 19 16 16 16   Temp: 98.3 F (36.8 C) 99.3 F (37.4 C) (!) 100.6 F (38.1 C)   TempSrc: Oral Oral Oral   SpO2: 97% 95% 96% 96%  Weight:      Height:        Constitutional: NAD, calm, comfortable Vitals:   12/12/22 0300 12/12/22 0720 12/12/22 1023 12/12/22 1100  BP: 105/80 113/80 (!) 118/106 101/77  Pulse: 96 (!) 114 (!) 113 (!) 104  Resp: 19 16 16 16   Temp: 98.3 F (36.8 C) 99.3 F (37.4 C) (!) 100.6 F (38.1 C)   TempSrc: Oral Oral Oral   SpO2: 97% 95% 96% 96%  Weight:      Height:       Eyes: PERRL, lids  and conjunctivae normal ENMT: Mucous membranes are moist. Posterior pharynx clear of any exudate or lesions.Normal dentition.  Neck: normal, supple, no masses, no thyromegaly Respiratory: clear to auscultation bilaterally, no wheezing, no crackles. Normal respiratory effort. No accessory muscle use.  Cardiovascular: Regular rate and rhythm, no murmurs / rubs / gallops. No extremity edema. 2+ pedal pulses. No carotid bruits.  Abdomen: no tenderness, no masses palpated. No hepatosplenomegaly. Bowel sounds positive.  Musculoskeletal: no clubbing / cyanosis. No joint deformity upper and lower extremities. Good ROM, no contractures. Normal muscle tone.  Skin: no rashes, lesions, ulcers. No induration Neurologic: CN 2-12 grossly intact. Sensation intact, DTR normal. Strength 5/5 in all 4.  Psychiatric: Normal judgment and insight. Alert and oriented x 3. Normal mood.    Labs on Admission: I have personally reviewed following labs and imaging studies  CBC: Recent Labs  Lab 12/12/22 0024  WBC 16.1*  NEUTROABS 14.7*  HGB 11.7*  HCT  34.3*  MCV 86.0  PLT 105*   Basic Metabolic Panel: Recent Labs  Lab 12/12/22 0024  NA 125*  K 4.1  CL 92*  CO2 24  GLUCOSE 225*  BUN 20  CREATININE 1.02  CALCIUM 8.5*   GFR: Estimated Creatinine Clearance: 65.9 mL/min (by C-G formula based on SCr of 1.02 mg/dL). Liver Function Tests: Recent Labs  Lab 12/12/22 0024  AST 20  ALT 34  ALKPHOS 407*  BILITOT 2.5*  PROT 7.6  ALBUMIN 3.0*   Recent Labs  Lab 12/12/22 0024  LIPASE 19   No results for input(s): "AMMONIA" in the last 168 hours. Coagulation Profile: No results for input(s): "INR", "PROTIME" in the last 168 hours. Cardiac Enzymes: No results for input(s): "CKTOTAL", "CKMB", "CKMBINDEX", "TROPONINI" in the last 168 hours. BNP (last 3 results) No results for input(s): "PROBNP" in the last 8760 hours. HbA1C: No results for input(s): "HGBA1C" in the last 72 hours. CBG: Recent Labs  Lab 12/12/22 0042  GLUCAP 233*   Lipid Profile: No results for input(s): "CHOL", "HDL", "LDLCALC", "TRIG", "CHOLHDL", "LDLDIRECT" in the last 72 hours. Thyroid Function Tests: No results for input(s): "TSH", "T4TOTAL", "FREET4", "T3FREE", "THYROIDAB" in the last 72 hours. Anemia Panel: No results for input(s): "VITAMINB12", "FOLATE", "FERRITIN", "TIBC", "IRON", "RETICCTPCT" in the last 72 hours. Urine analysis:    Component Value Date/Time   COLORURINE YELLOW 12/12/2022 0012   APPEARANCEUR CLEAR 12/12/2022 0012   LABSPEC 1.017 12/12/2022 0012   PHURINE 5.0 12/12/2022 0012   GLUCOSEU >=500 (A) 12/12/2022 0012   HGBUR NEGATIVE 12/12/2022 0012   BILIRUBINUR NEGATIVE 12/12/2022 0012   KETONESUR 5 (A) 12/12/2022 0012   PROTEINUR 30 (A) 12/12/2022 0012   UROBILINOGEN 0.2 02/25/2014 1320   NITRITE NEGATIVE 12/12/2022 0012   LEUKOCYTESUR NEGATIVE 12/12/2022 0012    Radiological Exams on Admission: CT ABDOMEN PELVIS W CONTRAST  Result Date: 12/12/2022 CLINICAL DATA:  History of mass-forming chronic pancreatitis with three  day history of right upper abdominal pain associated with fever EXAM: CT ABDOMEN AND PELVIS WITH CONTRAST TECHNIQUE: Multidetector CT imaging of the abdomen and pelvis was performed using the standard protocol following bolus administration of intravenous contrast. RADIATION DOSE REDUCTION: This exam was performed according to the departmental dose-optimization program which includes automated exposure control, adjustment of the mA and/or kV according to patient size and/or use of iterative reconstruction technique. CONTRAST:  OMNIPAQUE IOHEXOL 300 MG/ML  SOLN COMPARISON:  MR abdomen dated 10/15/2022, CT abdomen and pelvis dated 09/19/2022, CTA abdomen and pelvis dated 05/10/2022 FINDINGS: Lower  chest: No focal consolidation or pulmonary nodule in the lung bases. No pleural effusion or pneumothorax demonstrated. Partially imaged heart size is normal. Hepatobiliary: Interval development of multiloculated cystic focus centered within segment 7 measuring 4.5 x 3.4 x 7.6 cm (2:15, 7:88) With surrounding parenchymal hyperenhancement. Persistent intra hepatic bile duct dilation to the level of the portal confluence, where long segment narrowing of the common bile duct is again seen. Normal gallbladder. Pancreas: Sequela of chronic pancreatitis with diffuse parenchymal atrophy and bulky stone in the region of the pancreatic head. Spleen: Similar enlargement, 17.5 cm in the AP dimension. Adrenals/Urinary Tract: No adrenal nodules. No suspicious renal mass, calculi or hydronephrosis. Bilateral simple cysts. No specific follow-up imaging recommended. No focal bladder wall thickening. Stomach/Bowel: Normal appearance of the stomach. No evidence of bowel wall thickening, distention, or inflammatory changes. Appendix is not discretely seen. 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 Aortic atherosclerosis. No enlarged abdominal or  pelvic lymph nodes. Reproductive: Prostate is unremarkable. Other: No free fluid, fluid collection, or free air. Musculoskeletal: No acute or abnormal lytic or blastic osseous lesions. IMPRESSION: 1. Interval development of multiloculated cystic focus centered within segment 7 measuring 4.5 x 3.4 x 7.6 cm with surrounding parenchymal hyperenhancement, suspicious for hepatic abscess. 2. Persistent intra hepatic bile duct dilation to the level of the portal confluence, where long segment narrowing of the common bile duct is again seen. 3. Sequela of chronic pancreatitis with diffuse parenchymal atrophy and bulky stone in the region of the pancreatic head. 4. Similar splenomegaly. 5. 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 6.  Aortic Atherosclerosis (ICD10-I70.0). Electronically Signed   By: Agustin Cree M.D.   On: 12/12/2022 10:39   US Abdomen Complete  Result Date: 12/12/2022 CLINICAL DATA:  Upper abdominal pain EXAM: ABDOMEN ULTRASOUND COMPLETE COMPARISON:  CT abdomen and pelvis 08/19/2016 FINDINGS: Gallbladder: No gallstones or wall thickening visualized. No sonographic Murphy sign noted by sonographer. Common bile duct: Diameter: 3.7 mm Liver: No focal lesion identified. Within normal limits in parenchymal echogenicity. Portal vein is patent on color Doppler imaging with normal direction of blood flow towards the liver. IVC: No abnormality visualized. Pancreas: Visualized portion unremarkable. Spleen: Size and appearance within normal limits. Right Kidney: Length: 9.6 cm. Echogenicity within normal limits. No mass or hydronephrosis visualized. Left Kidney: Length: 10.4 cm. Echogenicity within normal limits. No mass or hydronephrosis visualized. There is a 2.5 cm simple cyst left kidney. No imaging follow-up needed. Abdominal aorta: No aneurysm visualized. Other findings: None. IMPRESSION: No cholelithiasis or sonographic evidence for acute cholecystitis.  Electronically Signed   By: Annia Belt M.D.   On: 12/12/2022 08:06     Assessment/Plan Principal Problem:   Sepsis (HCC) Active Problems:   Thrombocytopenia, unspecified (HCC)   Tobacco abuse   Hepatic abscess   Common biliary duct stricture   Pancytopenia (HCC)   Portal vein thrombosis   Splenomegaly   Sepsis secondary to hepatic abscess, POA: Patient met criteria for sepsis based on tachycardia, leukocytosis and fever.  Lactic acid is normal.  Cultures have been drawn in the ED.  Will give him another 1 L of IV fluid bolus and continue on Ringer's lactate.  I will continue Zosyn.  Patient to be evaluated by GI.  Norco as needed.  Acute hyponatremia: Likely hypovolemic.  Will check urine sodium, urine and serum osmolarity.  Monitor every 8 hours.  Prediabetes/hyperglycemia:  Appears to have prediabetes with hemoglobin A1c of 5.9 almost 10 months ago.  Significantly hyperglycemic in the ED today, will check hemoglobin A1c again.  Chronic pancreatitis: Noted.  Will resume home pain medications.  Anemia of chronic disease: Hemoglobin appears to be stable.  Tobacco dependence: Smokes half pack of cigarettes a day.  Nicotine patch ordered.  Counseling provided to quit.  DVT prophylaxis: enoxaparin (LOVENOX) injection 40 mg Start: 12/12/22 1200 Code Status: Full code Family Communication: None present at bedside.  Plan of care discussed with patient in length and he verbalized understanding and agreed with it. Disposition Plan: Potential discharge in 2 to 3 days. Consults called: GI  Hughie Closs MD [facility]  *Please note that this is a verbal dictation therefore any spelling or grammatical errors are due to the "[employer]agon [employer] One" system interpretation.  Please page via Amion and do not message via secure chat for urgent patient care matters. Secure chat can be used for non urgent patient care matters. 12/12/2022, 11:52 AM  To contact the attending provider between 7A-7P  or the covering provider during after hours 7P-7A, please log into the web site [email or web address]

## 2022-12-12 NOTE — ED Triage Notes (Signed)
Pt c/o right upper abdominal pain x 3 days with fevers x 4-5 days.

## 2022-12-12 NOTE — Progress Notes (Signed)
Pharmacy Antibiotic Note  Lawrence Brock is a 52 y.o. male admitted on 12/11/2022 with  intra-abdominal infection .  Pharmacy has been consulted for Zosyn dosing for sepsis secondary to hepatic abscess,   Plan: Zosyn 3.375g IV Q8H infused over 4hrs.  Follow up renal function, culture results, and clinical course.   Height: 5\' 8"  (172.7 cm) Weight: 54.4 kg (120 lb) IBW/kg (Calculated) : 68.4  Temp (24hrs), Avg:99.9 F (37.7 C), Min:98.3 F (36.8 C), Max:101.2 F (38.4 C)  Recent Labs  Lab 12/12/22 0024 12/12/22 0031 12/12/22 0813  WBC 16.1*  --   --   CREATININE 1.02  --   --   LATICACIDVEN  --  1.1 1.5    Estimated Creatinine Clearance: 65.9 mL/min (by C-G formula based on SCr of 1.02 mg/dL).    No Known Allergies  Antimicrobials this admission: 9/19 Zosyn >>   Dose adjustments this admission:   Microbiology results: 9/19 BCx:   Thank you for allowing pharmacy to be a part of this patient's care.  Lynann Beaver PharmD, BCPS [facility] main pharmacy [phone] 12/12/2022 11:53 AM

## 2022-12-12 NOTE — ED Notes (Signed)
Spoke with admitting provider. New orders to be placed.

## 2022-12-12 NOTE — ED Notes (Signed)
ED TO INPATIENT HANDOFF REPORT  ED Nurse Name and Phone #: Jeanmarie Hubert EMT-P  S Name/Age/Gender Lawrence Brock 52 y.o. male Room/Bed: WA10/WA10  Code Status   Code Status: Full Code  Home/SNF/Other Home Patient oriented to: self, place, time, and situation Is this baseline? Yes   Triage Complete: Triage complete  Chief Complaint Sepsis [facility]) [A41.9]  Triage Note Pt c/o right upper abdominal pain x 3 days with fevers x 4-5 days.    Allergies No Known Allergies  Level of Care/Admitting Diagnosis ED Disposition     ED Disposition  Admit   Condition  --   Comment  Hospital Area: [facility] [[id number]]  Level of Care: Med-Surg [16]  May admit patient to [facility] or [facility] if equivalent level of care is available:: No  Covid Evaluation: Confirmed COVID Negative  Diagnosis: Sepsis [facility]) [[id number]]  Admitting Physician: Hughie Closs [[id number]]  Attending Physician: Hughie Closs [[id number]]  Certification:: I certify this patient will need inpatient services for at least 2 midnights  Expected Medical Readiness: 12/14/2022          B Medical/Surgery History Past Medical History:  Diagnosis Date   Abdominal pain    intermittent abdominal pain in lower abdomen   Anemia    Cholangitis 01/2014   with liver abscess   Chronic back pain    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 pseudocyst in 08/2013. Follwed by Dr Rob Bunting and Rise Mu (at [facility]).     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    Hypertension    No meds yet.   Liver abscess 01/2014   Pneumonia    10-30-14 [facility] hospital admission, no problems now.   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    Thrombocytopenia (HCC) 2015   with splenomegaly 03/2014.  s/p bone marrow biopsy, nonspecific findings 09/2015   Past Surgical History:  Procedure Laterality Date    APPENDECTOMY     BIOPSY  05/11/2022   Procedure: BIOPSY;  Surgeon: Jenel Lucks, MD;  Location: [facility] ENDOSCOPY;  Service: Gastroenterology;;   COLONOSCOPY WITH PROPOFOL N/A 01/05/2015   Rachael Fee, MD;  Thre 7 to 13 mm tubular adenomatous polyps removed. repeat colonoscopy suggested in 12/2017.     ENDOSCOPIC RETROGRADE CHOLANGIOPANCREATOGRAPHY (ERCP) WITH PROPOFOL N/A 06/02/2014   Dr Rob Bunting.  removed covered metal stent.  no stricture noted.  Rec: biliary diversion surgery if recurrent stricture arises.   ERCP N/A 02/22/2014   Meryl Dare, MD; with removal of plastic stent, replaced with metal biliary stent.  filling defect: stone, sludge, blood clots removed with baloon sweep.     ERCP N/A 02/18/2014   For suspected cholangitis. Louis Meckel, MD; s/p sphincterotomy, blood clots and sludge swept out, remnant of pancreatic duct noted (removal not mentioned.  plastic stent placed into CBD.     ERCP, plastic pancreatic duct stent placement.  2007   Dr Danny Lawless at [facility].    ESOPHAGEAL BANDING  05/11/2022   Procedure: ESOPHAGEAL BANDING;  Surgeon: Jenel Lucks, MD;  Location: [facility] ENDOSCOPY;  Service: Gastroenterology;;   ESOPHAGOGASTRODUODENOSCOPY N/A 05/11/2022   Procedure: ESOPHAGOGASTRODUODENOSCOPY (EGD);  Surgeon: Jenel Lucks, MD;  Location: [facility] ENDOSCOPY;  Service: Gastroenterology;  Laterality: N/A;   ESOPHAGOGASTRODUODENOSCOPY (EGD) WITH PROPOFOL N/A 09/14/2013   Dr Rhea Belton.  erosive gastritis.  bulbar ulcer.  removal of plastic  biliaty stent.     ESOPHAGOGASTRODUODENOSCOPY (EGD) WITH PROPOFOL N/A 01/05/2015   for melena. Rachael Fee.  Large distal esophageal varices, no bleeding stigmata.  no gastric varices, not banded.  Moderate to severe portal hypertensive gastropathy.  Nadolol initiated.    ESOPHAGOGASTRODUODENOSCOPY (EGD) WITH PROPOFOL N/A 08/19/2016   Armbruster, Reeves Forth, MD; Large esophageal varices with red whale signs, high  risk for bleeding. 11 bands placed.  Portal hypertensive gastropathy.    FLEXIBLE SIGMOIDOSCOPY N/A 05/11/2022   Procedure: FLEXIBLE SIGMOIDOSCOPY;  Surgeon: Jenel Lucks, MD;  Location: [facility] ENDOSCOPY;  Service: Gastroenterology;  Laterality: N/A;   GASTROINTESTINAL STENT REMOVAL N/A 09/14/2013   Beverley Fiedler, MD; Pancreatic stent removal     A IV Location/Drains/Wounds Patient Lines/Drains/Airways Status     Active Line/Drains/Airways     Name Placement date Placement time Site Days   Peripheral IV 12/12/22 20 G 1" Anterior;Left Forearm 12/12/22  0800  Forearm  less than 1   GI Stent 02/22/14  0947  --  3215            Intake/Output Last 24 hours No intake or output data in the 24 hours ending 12/12/22 1748  Labs/Imaging Results for orders placed or performed during the hospital encounter of 12/11/22 (from the past 48 hour(s))  Urinalysis, w/ Reflex to Culture (Infection Suspected) -Urine, Clean Catch     Status: Abnormal   Collection Time: 12/12/22 12:12 AM  Result Value Ref Range   Specimen Source URINE, CLEAN CATCH    Color, Urine YELLOW YELLOW   APPearance CLEAR CLEAR   Specific Gravity, Urine 1.017 1.005 - 1.030   pH 5.0 5.0 - 8.0   Glucose, UA >=500 (A) NEGATIVE mg/dL   Hgb urine dipstick NEGATIVE NEGATIVE   Bilirubin Urine NEGATIVE NEGATIVE   Ketones, ur 5 (A) NEGATIVE mg/dL   Protein, ur 30 (A) NEGATIVE mg/dL   Nitrite NEGATIVE NEGATIVE   Leukocytes,Ua NEGATIVE NEGATIVE   RBC / HPF 0-5 0 - 5 RBC/hpf   WBC, UA 0-5 0 - 5 WBC/hpf    Comment:        Reflex urine culture not performed if WBC <=10, OR if Squamous epithelial cells >5. If Squamous epithelial cells >5 suggest recollection.    Bacteria, UA NONE SEEN NONE SEEN   Squamous Epithelial / HPF 0-5 0 - 5 /HPF   Mucus PRESENT     Comment: Performed at [facility], [address] [place], [place] [address]  Osmolality, urine     Status: None   Collection Time: 12/12/22 12:12 AM   Result Value Ref Range   Osmolality, Ur 734 300 - 900 mOsm/kg    Comment: REPEATED TO VERIFY Performed at [facility], [address] [place], [place] [address]   Sodium, urine, random     Status: None   Collection Time: 12/12/22 12:12 AM  Result Value Ref Range   Sodium, Ur 133 mmol/L    Comment: Performed at [facility] [address] [place], [place] [address]  Comprehensive metabolic panel     Status: Abnormal   Collection Time: 12/12/22 12:24 AM  Result Value Ref Range   Sodium 125 (L) 135 - 145 mmol/L   Potassium 4.1 3.5 - 5.1 mmol/L   Chloride 92 (L) 98 - 111 mmol/L   CO2 24 22 - 32 mmol/L   Glucose, Bld 225 (H) 70 - 99 mg/dL    Comment: Glucose reference range applies only  to samples taken after fasting for at least 8 hours.   BUN 20 6 - 20 mg/dL   Creatinine, Ser 6.57 0.61 - 1.24 mg/dL   Calcium 8.5 (L) 8.9 - 10.3 mg/dL   Total Protein 7.6 6.5 - 8.1 g/dL   Albumin 3.0 (L) 3.5 - 5.0 g/dL   AST 20 15 - 41 U/L   ALT 34 0 - 44 U/L   Alkaline Phosphatase 407 (H) 38 - 126 U/L   Total Bilirubin 2.5 (H) 0.3 - 1.2 mg/dL   GFR, Estimated >84 >69 mL/min    Comment: (NOTE) Calculated using the CKD-EPI Creatinine Equation (2021)    Anion gap 9 5 - 15    Comment: Performed at [facility], [address], [place], [place] [address]  CBC with Differential     Status: Abnormal   Collection Time: 12/12/22 12:24 AM  Result Value Ref Range   WBC 16.1 (H) 4.0 - 10.5 K/uL   RBC 3.99 (L) 4.22 - 5.81 MIL/uL   Hemoglobin 11.7 (L) 13.0 - 17.0 g/dL   HCT 84.1 (L) 32.4 - 40.1 %   MCV 86.0 80.0 - 100.0 fL   MCH 29.3 26.0 - 34.0 pg   MCHC 34.1 30.0 - 36.0 g/dL   RDW 02.7 25.3 - 66.4 %   Platelets 105 (L) 150 - 400 K/uL    Comment: Immature Platelet Fraction may be clinically indicated, consider ordering this additional test [id number]    nRBC 0.0 0.0 - 0.2 %   Neutrophils Relative % 90 %   Neutro Abs 14.7 (H) 1.7 - 7.7 K/uL    Lymphocytes Relative 3 %   Lymphs Abs 0.4 (L) 0.7 - 4.0 K/uL   Monocytes Relative 6 %   Monocytes Absolute 0.9 0.1 - 1.0 K/uL   Eosinophils Relative 0 %   Eosinophils Absolute 0.0 0.0 - 0.5 K/uL   Basophils Relative 0 %   Basophils Absolute 0.0 0.0 - 0.1 K/uL   Immature Granulocytes 1 %   Abs Immature Granulocytes 0.16 (H) 0.00 - 0.07 K/uL    Comment: Performed at [facility], [address] [place], [place] [address]  Lipase, blood     Status: None   Collection Time: 12/12/22 12:24 AM  Result Value Ref Range   Lipase 19 11 - 51 U/L    Comment: Performed at [facility], [address], [place], [place] [address]  I-Stat Lactic Acid, ED     Status: None   Collection Time: 12/12/22 12:31 AM  Result Value Ref Range   Lactic Acid, Venous 1.1 0.5 - 1.9 mmol/L  CBG monitoring, ED     Status: Abnormal   Collection Time: 12/12/22 12:42 AM  Result Value Ref Range   Glucose-Capillary 233 (H) 70 - 99 mg/dL    Comment: Glucose reference range applies only to samples taken after fasting for at least 8 hours.  I-Stat Lactic Acid, ED     Status: None   Collection Time: 12/12/22  8:13 AM  Result Value Ref Range   Lactic Acid, Venous 1.5 0.5 - 1.9 mmol/L  SARS Coronavirus 2 by RT PCR (hospital order, performed in [facility] hospital lab) *cepheid single result test* Anterior Nasal Swab     Status: None   Collection Time: 12/12/22  8:32 AM   Specimen: Anterior Nasal Swab  Result Value Ref Range   SARS Coronavirus 2 by RT PCR NEGATIVE NEGATIVE    Comment: (NOTE) SARS-CoV-2 target  nucleic acids are NOT DETECTED.  The SARS-CoV-2 RNA is generally detectable in upper and lower respiratory specimens during the acute phase of infection. The lowest concentration of SARS-CoV-2 viral copies this assay can detect is 250 copies / mL. A negative result does not preclude SARS-CoV-2 infection and should not be used as the sole basis for treatment or other patient  management decisions.  A negative result may occur with improper specimen collection / handling, submission of specimen other than nasopharyngeal swab, presence of viral mutation(s) within the areas targeted by this assay, and inadequate number of viral copies (<250 copies / mL). A negative result must be combined with clinical observations, patient history, and epidemiological information.  Fact Sheet for Patients:   RoadLapTop.co.za  Fact Sheet for Healthcare Providers: http://kim-miller.com/  This test is not yet approved or  cleared by the [place] FDA and has been authorized for detection and/or diagnosis of SARS-CoV-2 by FDA under an Emergency Use Authorization (EUA).  This EUA will remain in effect (meaning this test can be used) for the duration of the COVID-19 declaration under Section 564(b)(1) of the Act, 21 U.S.C. section 360bbb-3(b)(1), unless the authorization is terminated or revoked sooner.  Performed at [facility], [address] [place], [place] [address]   CBC     Status: Abnormal   Collection Time: 12/12/22 11:51 AM  Result Value Ref Range   WBC 16.0 (H) 4.0 - 10.5 K/uL   RBC 3.55 (L) 4.22 - 5.81 MIL/uL   Hemoglobin 10.4 (L) 13.0 - 17.0 g/dL   HCT 13.0 (L) 86.5 - 78.4 %   MCV 86.5 80.0 - 100.0 fL   MCH 29.3 26.0 - 34.0 pg   MCHC 33.9 30.0 - 36.0 g/dL   RDW 69.6 29.5 - 28.4 %   Platelets 80 (L) 150 - 400 K/uL    Comment: SPECIMEN CHECKED FOR CLOTS Immature Platelet Fraction may be clinically indicated, consider ordering this additional test [id number] REPEATED TO VERIFY PLATELET COUNT CONFIRMED BY SMEAR    nRBC 0.0 0.0 - 0.2 %    Comment: Performed at [facility], [address], [place], [place] [address]  Creatinine, serum     Status: None   Collection Time: 12/12/22 11:51 AM  Result Value Ref Range   Creatinine, Ser 0.86 0.61 - 1.24 mg/dL   GFR, Estimated  >66 >44 mL/min    Comment: (NOTE) Calculated using the CKD-EPI Creatinine Equation (2021) Performed at [facility], [address], [place], [place] [address]   Magnesium     Status: None   Collection Time: 12/12/22 11:51 AM  Result Value Ref Range   Magnesium 1.7 1.7 - 2.4 mg/dL    Comment: Performed at [facility], [address], [place], [place] [address]  Hemoglobin A1c     Status: Abnormal   Collection Time: 12/12/22 11:51 AM  Result Value Ref Range   Hgb A1c MFr Bld 8.4 (H) 4.8 - 5.6 %    Comment: (NOTE) Pre diabetes:          5.7%-6.4%  Diabetes:              >6.4%  Glycemic control for   <7.0% adults with diabetes    Mean Plasma Glucose 194.38 mg/dL    Comment: Performed at [facility], [address] [place], [place] [address]   CT ABDOMEN PELVIS W CONTRAST  Result Date: 12/12/2022 CLINICAL DATA:  History of mass-forming chronic  pancreatitis with three day history of right upper abdominal pain associated with fever EXAM: CT ABDOMEN AND PELVIS WITH CONTRAST TECHNIQUE: Multidetector CT imaging of the abdomen and pelvis was performed using the standard protocol following bolus administration of intravenous contrast. RADIATION DOSE REDUCTION: This exam was performed according to the departmental dose-optimization program which includes automated exposure control, adjustment of the mA and/or kV according to patient size and/or use of iterative reconstruction technique. CONTRAST:  OMNIPAQUE IOHEXOL 300 MG/ML  SOLN COMPARISON:  MR abdomen dated 10/15/2022, CT abdomen and pelvis dated 09/19/2022, CTA abdomen and pelvis dated 05/10/2022 FINDINGS: Lower chest: No focal consolidation or pulmonary nodule in the lung bases. No pleural effusion or pneumothorax demonstrated. Partially imaged heart size is normal. Hepatobiliary: Interval development of multiloculated cystic focus centered within segment 7 measuring 4.5 x 3.4 x 7.6 cm (2:15,  7:88) With surrounding parenchymal hyperenhancement. Persistent intra hepatic bile duct dilation to the level of the portal confluence, where long segment narrowing of the common bile duct is again seen. Normal gallbladder. Pancreas: Sequela of chronic pancreatitis with diffuse parenchymal atrophy and bulky stone in the region of the pancreatic head. Spleen: Similar enlargement, 17.5 cm in the AP dimension. Adrenals/Urinary Tract: No adrenal nodules. No suspicious renal mass, calculi or hydronephrosis. Bilateral simple cysts. No specific follow-up imaging recommended. No focal bladder wall thickening. Stomach/Bowel: Normal appearance of the stomach. No evidence of bowel wall thickening, distention, or inflammatory changes. Appendix is not discretely seen. 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 Aortic atherosclerosis. No enlarged abdominal or pelvic lymph nodes. Reproductive: Prostate is unremarkable. Other: No free fluid, fluid collection, or free air. Musculoskeletal: No acute or abnormal lytic or blastic osseous lesions. IMPRESSION: 1. Interval development of multiloculated cystic focus centered within segment 7 measuring 4.5 x 3.4 x 7.6 cm with surrounding parenchymal hyperenhancement, suspicious for hepatic abscess. 2. Persistent intra hepatic bile duct dilation to the level of the portal confluence, where long segment narrowing of the common bile duct is again seen. 3. Sequela of chronic pancreatitis with diffuse parenchymal atrophy and bulky stone in the region of the pancreatic head. 4. Similar splenomegaly. 5. 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 6.  Aortic Atherosclerosis (ICD10-I70.0). Electronically Signed   By: Agustin Cree M.D.   On: 12/12/2022 10:39   US Abdomen Complete  Result Date: 12/12/2022 CLINICAL DATA:  Upper abdominal pain EXAM: ABDOMEN  ULTRASOUND COMPLETE COMPARISON:  CT abdomen and pelvis 08/19/2016 FINDINGS: Gallbladder: No gallstones or wall thickening visualized. No sonographic Murphy sign noted by sonographer. Common bile duct: Diameter: 3.7 mm Liver: No focal lesion identified. Within normal limits in parenchymal echogenicity. Portal vein is patent on color Doppler imaging with normal direction of blood flow towards the liver. IVC: No abnormality visualized. Pancreas: Visualized portion unremarkable. Spleen: Size and appearance within normal limits. Right Kidney: Length: 9.6 cm. Echogenicity within normal limits. No mass or hydronephrosis visualized. Left Kidney: Length: 10.4 cm. Echogenicity within normal limits. No mass or hydronephrosis visualized. There is a 2.5 cm simple cyst left kidney. No imaging follow-up needed. Abdominal aorta: No aneurysm visualized. Other findings: None. IMPRESSION: No cholelithiasis or sonographic evidence for acute cholecystitis. Electronically Signed   By: Annia Belt M.D.   On: 12/12/2022 08:06    Pending Labs Unresulted Labs (From admission, onward)     Start     Ordered   12/19/22 0500  Creatinine, serum  (enoxaparin (LOVENOX)    CrCl >/= 30 ml/min)  Weekly,   R     Comments: while on enoxaparin therapy    12/12/22 1152   12/13/22 0500  Protime-INR  Tomorrow morning,   R        12/12/22 1152   12/13/22 0500  Cortisol-am, blood  Tomorrow morning,   R        12/12/22 1152   12/13/22 0500  Procalcitonin  Tomorrow morning,   R       References:    Procalcitonin Lower Respiratory Tract Infection AND Sepsis Procalcitonin Algorithm   12/12/22 1152   12/13/22 0500  Comprehensive metabolic panel  Tomorrow morning,   R        12/12/22 1152   12/13/22 0500  CBC  Tomorrow morning,   R        12/12/22 1152   12/12/22 1159  Osmolality  Once,   R        12/12/22 1158   12/12/22 0738  Blood culture (routine x 2)  BLOOD CULTURE X 2,   R      12/12/22 0737            Vitals/Pain Today's  Vitals   12/12/22 1315 12/12/22 1626 12/12/22 1628 12/12/22 1630  BP: 109/75   (!) 87/66  Pulse:    99  Resp:    16  Temp:   98 F (36.7 C)   TempSrc:      SpO2:    97%  Weight:      Height:      PainSc:  8       Isolation Precautions No active isolations  Medications Medications  sodium chloride 0.9 % bolus 1,000 mL (0 mLs Intravenous Stopped 12/12/22 1154)    Followed by  0.9 %  sodium chloride infusion (0 mLs Intravenous Stopped 12/12/22 1711)  HYDROcodone-acetaminophen (NORCO) 7.5-325 MG per tablet 1 tablet (1 tablet Oral Given 12/12/22 1400)  lactated ringers infusion (has no administration in time range)  enoxaparin (LOVENOX) injection 40 mg (40 mg Subcutaneous Given 12/12/22 1409)  acetaminophen (TYLENOL) tablet 650 mg (has no administration in time range)    Or  acetaminophen (TYLENOL) suppository 650 mg (has no administration in time range)  ondansetron (ZOFRAN) tablet 4 mg (has no administration in time range)    Or  ondansetron (ZOFRAN) injection 4 mg (has no administration in time range)  nicotine (NICODERM CQ - dosed in mg/24 hours) patch 14 mg (has no administration in time range)  piperacillin-tazobactam (ZOSYN) IVPB 3.375 g (3.375 g Intravenous New Bag/Given 12/12/22 1630)  acetaminophen (TYLENOL) tablet 650 mg (650 mg Oral Given 12/12/22 0034)  HYDROmorphone (DILAUDID) injection 1 mg (1 mg Intravenous Given 12/12/22 0806)  ondansetron (ZOFRAN) injection 4 mg (4 mg Intravenous Given 12/12/22 0805)  piperacillin-tazobactam (ZOSYN) IVPB 3.375 g (0 g Intravenous Stopped 12/12/22 1154)  iohexol (OMNIPAQUE) 300 MG/ML solution 100 mL (100 mLs Intravenous Contrast Given 12/12/22 0921)  acetaminophen (TYLENOL) tablet 650 mg (650 mg Oral Given 12/12/22 1034)  sodium chloride 0.9 % bolus 1,088 mL (0 mLs Intravenous Stopped 12/12/22 1410)  sodium chloride 0.9 % bolus 1,000 mL (1,000 mLs Intravenous New Bag/Given 12/12/22 1711)    Mobility walks     Focused Assessments See  Chart   R Recommendations: See Admitting Provider Note  Report given to: Illene Silver RN

## 2022-12-12 NOTE — Consult Note (Signed)
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  He was well until four days ago.  The pain started at that time and it increased.  He tried to tolerate the pain and continue to work at his job, but the pain increased in intensity.  He also reports having a fever to 102 F.  In the ER work up was performed with a CT scan and there was the interval development of a presumed hepatic abscess measuring 7.6 cm x 3.4 cm x 4.5 cm in the right lobe of the liver.  This is a multiloculated lesion.  He states that he had a hepatic abscess last year, but no notes from the outside hospital can be located for this issue.  Historically, there was a listed history of a hepatic abscess from 2015.  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 pseudocyst in 08/2013. Follwed by Dr Rob Bunting and Rise Mu (at [facility]).     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    Hypertension    No meds yet.   Liver abscess 01/2014   Pneumonia    10-30-14 [facility] hospital admission, no problems now.   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    Thrombocytopenia (HCC) 2015   with splenomegaly 03/2014.  s/p bone marrow biopsy, nonspecific findings 09/2015     Past Surgical History:  Procedure Laterality Date   APPENDECTOMY     BIOPSY  05/11/2022   Procedure: BIOPSY;  Surgeon: Jenel Lucks, MD;  Location: [facility] ENDOSCOPY;  Service: Gastroenterology;;   COLONOSCOPY WITH PROPOFOL N/A 01/05/2015   Rachael Fee, MD;  Thre 7 to 13 mm tubular adenomatous polyps removed. repeat colonoscopy suggested in 12/2017.     ENDOSCOPIC RETROGRADE CHOLANGIOPANCREATOGRAPHY (ERCP) WITH PROPOFOL N/A 06/02/2014   Dr Rob Bunting.  removed covered metal stent.  no stricture noted.  Rec: biliary diversion surgery if recurrent stricture arises.   ERCP N/A 02/22/2014   Meryl Dare, MD; with removal of plastic stent, replaced with metal biliary stent.  filling defect: stone, sludge, blood clots removed with baloon sweep.     ERCP N/A 02/18/2014   For suspected cholangitis. Louis Meckel, MD; s/p sphincterotomy, blood clots and sludge swept out, remnant of pancreatic duct noted (removal not mentioned.  plastic stent placed into CBD.     ERCP, plastic pancreatic duct stent placement.  2007   Dr Danny Lawless at [facility].    ESOPHAGEAL BANDING  05/11/2022   Procedure: ESOPHAGEAL BANDING;  Surgeon: Jenel Lucks, MD;  Location: [facility] ENDOSCOPY;  Service: Gastroenterology;;   ESOPHAGOGASTRODUODENOSCOPY N/A 05/11/2022   Procedure: ESOPHAGOGASTRODUODENOSCOPY (EGD);  Surgeon: Jenel Lucks, MD;  Location: [facility] ENDOSCOPY;  Service: Gastroenterology;  Laterality: N/A;   ESOPHAGOGASTRODUODENOSCOPY (EGD) WITH PROPOFOL  N/A 09/14/2013   Dr Rhea Belton.  erosive gastritis.  bulbar ulcer.  removal of plastic biliaty stent.     ESOPHAGOGASTRODUODENOSCOPY (EGD) WITH PROPOFOL N/A 01/05/2015   for melena. Rachael Fee.  Large distal esophageal varices, no bleeding stigmata.  no gastric varices, not banded.  Moderate to severe portal hypertensive gastropathy.  Nadolol initiated.    ESOPHAGOGASTRODUODENOSCOPY (EGD) WITH PROPOFOL N/A 08/19/2016   Armbruster, Reeves Forth,  MD; Large esophageal varices with red whale signs, high risk for bleeding. 11 bands placed.  Portal hypertensive gastropathy.    FLEXIBLE SIGMOIDOSCOPY N/A 05/11/2022   Procedure: FLEXIBLE SIGMOIDOSCOPY;  Surgeon: Jenel Lucks, MD;  Location: [facility] ENDOSCOPY;  Service: Gastroenterology;  Laterality: N/A;   GASTROINTESTINAL STENT REMOVAL N/A 09/14/2013   Beverley Fiedler, MD; Pancreatic stent removal    Family History  Problem Relation Age of Onset   Hypertension Mother     Social History:  reports that he has been smoking cigarettes. He has never used smokeless tobacco. He reports that he does not drink alcohol and does not use drugs.  Allergies: No Known Allergies  Medications: Scheduled:  enoxaparin (LOVENOX) injection  40 mg Subcutaneous Daily   [START ON 12/13/2022] nicotine  14 mg Transdermal Daily   Continuous:  sodium chloride Stopped (12/12/22 1711)   lactated ringers 150 mL/hr (12/12/22 2000)   piperacillin-tazobactam (ZOSYN)  IV 3.375 g (12/12/22 1630)    Results for orders placed or performed during the hospital encounter of 12/11/22 (from the past 24 hour(s))  Urinalysis, w/ Reflex to Culture (Infection Suspected) -Urine, Clean Catch     Status: Abnormal   Collection Time: 12/12/22 12:12 AM  Result Value Ref Range   Specimen Source URINE, CLEAN CATCH    Color, Urine YELLOW YELLOW   APPearance CLEAR CLEAR   Specific Gravity, Urine 1.017 1.005 - 1.030   pH 5.0 5.0 - 8.0   Glucose, UA >=500 (A) NEGATIVE mg/dL   Hgb urine dipstick NEGATIVE NEGATIVE   Bilirubin Urine NEGATIVE NEGATIVE   Ketones, ur 5 (A) NEGATIVE mg/dL   Protein, ur 30 (A) NEGATIVE mg/dL   Nitrite NEGATIVE NEGATIVE   Leukocytes,Ua NEGATIVE NEGATIVE   RBC / HPF 0-5 0 - 5 RBC/hpf   WBC, UA 0-5 0 - 5 WBC/hpf   Bacteria, UA NONE SEEN NONE SEEN   Squamous Epithelial / HPF 0-5 0 - 5 /HPF   Mucus PRESENT   Osmolality, urine     Status: None   Collection Time: 12/12/22 12:12 AM  Result Value Ref Range    Osmolality, Ur 734 300 - 900 mOsm/kg  Sodium, urine, random     Status: None   Collection Time: 12/12/22 12:12 AM  Result Value Ref Range   Sodium, Ur 133 mmol/L  Comprehensive metabolic panel     Status: Abnormal   Collection Time: 12/12/22 12:24 AM  Result Value Ref Range   Sodium 125 (L) 135 - 145 mmol/L   Potassium 4.1 3.5 - 5.1 mmol/L   Chloride 92 (L) 98 - 111 mmol/L   CO2 24 22 - 32 mmol/L   Glucose, Bld 225 (H) 70 - 99 mg/dL   BUN 20 6 - 20 mg/dL   Creatinine, Ser 1.61 0.61 - 1.24 mg/dL   Calcium 8.5 (L) 8.9 - 10.3 mg/dL   Total Protein 7.6 6.5 - 8.1 g/dL   Albumin 3.0 (L) 3.5 - 5.0 g/dL   AST 20 15 - 41 U/L   ALT 34 0 - 44 U/L  Alkaline Phosphatase 407 (H) 38 - 126 U/L   Total Bilirubin 2.5 (H) 0.3 - 1.2 mg/dL   GFR, Estimated >57 >84 mL/min   Anion gap 9 5 - 15  CBC with Differential     Status: Abnormal   Collection Time: 12/12/22 12:24 AM  Result Value Ref Range   WBC 16.1 (H) 4.0 - 10.5 K/uL   RBC 3.99 (L) 4.22 - 5.81 MIL/uL   Hemoglobin 11.7 (L) 13.0 - 17.0 g/dL   HCT 69.6 (L) 29.5 - 28.4 %   MCV 86.0 80.0 - 100.0 fL   MCH 29.3 26.0 - 34.0 pg   MCHC 34.1 30.0 - 36.0 g/dL   RDW 13.2 44.0 - 10.2 %   Platelets 105 (L) 150 - 400 K/uL   nRBC 0.0 0.0 - 0.2 %   Neutrophils Relative % 90 %   Neutro Abs 14.7 (H) 1.7 - 7.7 K/uL   Lymphocytes Relative 3 %   Lymphs Abs 0.4 (L) 0.7 - 4.0 K/uL   Monocytes Relative 6 %   Monocytes Absolute 0.9 0.1 - 1.0 K/uL   Eosinophils Relative 0 %   Eosinophils Absolute 0.0 0.0 - 0.5 K/uL   Basophils Relative 0 %   Basophils Absolute 0.0 0.0 - 0.1 K/uL   Immature Granulocytes 1 %   Abs Immature Granulocytes 0.16 (H) 0.00 - 0.07 K/uL  Lipase, blood     Status: None   Collection Time: 12/12/22 12:24 AM  Result Value Ref Range   Lipase 19 11 - 51 U/L  I-Stat Lactic Acid, ED     Status: None   Collection Time: 12/12/22 12:31 AM  Result Value Ref Range   Lactic Acid, Venous 1.1 0.5 - 1.9 mmol/L  CBG monitoring, ED     Status:  Abnormal   Collection Time: 12/12/22 12:42 AM  Result Value Ref Range   Glucose-Capillary 233 (H) 70 - 99 mg/dL  I-Stat Lactic Acid, ED     Status: None   Collection Time: 12/12/22  8:13 AM  Result Value Ref Range   Lactic Acid, Venous 1.5 0.5 - 1.9 mmol/L  SARS Coronavirus 2 by RT PCR (hospital order, performed in [facility] hospital lab) *cepheid single result test* Anterior Nasal Swab     Status: None   Collection Time: 12/12/22  8:32 AM   Specimen: Anterior Nasal Swab  Result Value Ref Range   SARS Coronavirus 2 by RT PCR NEGATIVE NEGATIVE  CBC     Status: Abnormal   Collection Time: 12/12/22 11:51 AM  Result Value Ref Range   WBC 16.0 (H) 4.0 - 10.5 K/uL   RBC 3.55 (L) 4.22 - 5.81 MIL/uL   Hemoglobin 10.4 (L) 13.0 - 17.0 g/dL   HCT 72.5 (L) 36.6 - 44.0 %   MCV 86.5 80.0 - 100.0 fL   MCH 29.3 26.0 - 34.0 pg   MCHC 33.9 30.0 - 36.0 g/dL   RDW 34.7 42.5 - 95.6 %   Platelets 80 (L) 150 - 400 K/uL   nRBC 0.0 0.0 - 0.2 %  Creatinine, serum     Status: None   Collection Time: 12/12/22 11:51 AM  Result Value Ref Range   Creatinine, Ser 0.86 0.61 - 1.24 mg/dL   GFR, Estimated >38 >75 mL/min  Magnesium     Status: None   Collection Time: 12/12/22 11:51 AM  Result Value Ref Range   Magnesium 1.7 1.7 - 2.4 mg/dL  Hemoglobin I4P     Status: Abnormal  Collection Time: 12/12/22 11:51 AM  Result Value Ref Range   Hgb A1c MFr Bld 8.4 (H) 4.8 - 5.6 %   Mean Plasma Glucose 194.38 mg/dL     CT ABDOMEN PELVIS W CONTRAST  Result Date: 12/12/2022 CLINICAL DATA:  History of mass-forming chronic pancreatitis with three day history of right upper abdominal pain associated with fever EXAM: CT ABDOMEN AND PELVIS WITH CONTRAST TECHNIQUE: Multidetector CT imaging of the abdomen and pelvis was performed using the standard protocol following bolus administration of intravenous contrast. RADIATION DOSE REDUCTION: This exam was performed according to the departmental dose-optimization program which  includes automated exposure control, adjustment of the mA and/or kV according to patient size and/or use of iterative reconstruction technique. CONTRAST:  OMNIPAQUE IOHEXOL 300 MG/ML  SOLN COMPARISON:  MR abdomen dated 10/15/2022, CT abdomen and pelvis dated 09/19/2022, CTA abdomen and pelvis dated 05/10/2022 FINDINGS: Lower chest: No focal consolidation or pulmonary nodule in the lung bases. No pleural effusion or pneumothorax demonstrated. Partially imaged heart size is normal. Hepatobiliary: Interval development of multiloculated cystic focus centered within segment 7 measuring 4.5 x 3.4 x 7.6 cm (2:15, 7:88) With surrounding parenchymal hyperenhancement. Persistent intra hepatic bile duct dilation to the level of the portal confluence, where long segment narrowing of the common bile duct is again seen. Normal gallbladder. Pancreas: Sequela of chronic pancreatitis with diffuse parenchymal atrophy and bulky stone in the region of the pancreatic head. Spleen: Similar enlargement, 17.5 cm in the AP dimension. Adrenals/Urinary Tract: No adrenal nodules. No suspicious renal mass, calculi or hydronephrosis. Bilateral simple cysts. No specific follow-up imaging recommended. No focal bladder wall thickening. Stomach/Bowel: Normal appearance of the stomach. No evidence of bowel wall thickening, distention, or inflammatory changes. Appendix is not discretely seen. 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 Aortic atherosclerosis. No enlarged abdominal or pelvic lymph nodes. Reproductive: Prostate is unremarkable. Other: No free fluid, fluid collection, or free air. Musculoskeletal: No acute or abnormal lytic or blastic osseous lesions. IMPRESSION: 1. Interval development of multiloculated cystic focus centered within segment 7 measuring 4.5 x 3.4 x 7.6 cm with surrounding parenchymal hyperenhancement, suspicious for hepatic  abscess. 2. Persistent intra hepatic bile duct dilation to the level of the portal confluence, where long segment narrowing of the common bile duct is again seen. 3. Sequela of chronic pancreatitis with diffuse parenchymal atrophy and bulky stone in the region of the pancreatic head. 4. Similar splenomegaly. 5. 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 6.  Aortic Atherosclerosis (ICD10-I70.0). Electronically Signed   By: Agustin Cree M.D.   On: 12/12/2022 10:39   US Abdomen Complete  Result Date: 12/12/2022 CLINICAL DATA:  Upper abdominal pain EXAM: ABDOMEN ULTRASOUND COMPLETE COMPARISON:  CT abdomen and pelvis 08/19/2016 FINDINGS: Gallbladder: No gallstones or wall thickening visualized. No sonographic Murphy sign noted by sonographer. Common bile duct: Diameter: 3.7 mm Liver: No focal lesion identified. Within normal limits in parenchymal echogenicity. Portal vein is patent on color Doppler imaging with normal direction of blood flow towards the liver. IVC: No abnormality visualized. Pancreas: Visualized portion unremarkable. Spleen: Size and appearance within normal limits. Right Kidney: Length: 9.6 cm. Echogenicity within normal limits. No mass or hydronephrosis visualized. Left Kidney: Length: 10.4 cm. Echogenicity within normal limits. No mass or hydronephrosis visualized. There is a 2.5 cm simple cyst left kidney. No imaging follow-up needed. Abdominal aorta: No aneurysm visualized. Other findings:  None. IMPRESSION: No cholelithiasis or sonographic evidence for acute cholecystitis. Electronically Signed   By: Annia Belt M.D.   On: 12/12/2022 08:06    ROS:  As stated above in the HPI otherwise negative.  Blood pressure 93/68, pulse 99, temperature 97.8 F (36.6 C), temperature source Oral, resp. rate 16, height 5\' 8"  (1.727 m), weight 54.4 kg, SpO2 94%.    PE: Gen: NAD, Alert and Oriented HEENT:  [place]/AT, EOMI Neck: Supple, no LAD Lungs: CTA  Bilaterally CV: RRR without M/G/R ABD: Soft, tender in the RUQ, +BS Ext: No C/C/E  Assessment/Plan: 1) Multiloculated hepatic abscess. 2) Cirrhosis. 3) Chronic pancreatitis. 4) Biliary stricture.   This is a very complex hepatobiliary patient that will require the input of Surgery and IR, presuming it is an abscess.  Currently he is stable and he is on appropriate treatment with with Zosyn.  Plan: 1) Surgical and IR consultations in the AM. 2) Continue with Zosyn.  Kayron Hicklin D 12/12/2022, 8:43 PM

## 2022-12-13 ENCOUNTER — Encounter (HOSPITAL_COMMUNITY): Payer: Self-pay | Admitting: Family Medicine

## 2022-12-13 DIAGNOSIS — A419 Sepsis, unspecified organism: Secondary | ICD-10-CM | POA: Diagnosis not present

## 2022-12-13 LAB — CBC
HCT: 36.7 % — ABNORMAL LOW (ref 39.0–52.0)
Hemoglobin: 12.2 g/dL — ABNORMAL LOW (ref 13.0–17.0)
MCH: 29.1 pg (ref 26.0–34.0)
MCHC: 33.2 g/dL (ref 30.0–36.0)
MCV: 87.6 fL (ref 80.0–100.0)
Platelets: 68 10*3/uL — ABNORMAL LOW (ref 150–400)
RBC: 4.19 MIL/uL — ABNORMAL LOW (ref 4.22–5.81)
RDW: 13.9 % (ref 11.5–15.5)
WBC: 14.4 10*3/uL — ABNORMAL HIGH (ref 4.0–10.5)
nRBC: 0 % (ref 0.0–0.2)

## 2022-12-13 LAB — COMPREHENSIVE METABOLIC PANEL
ALT: 48 U/L — ABNORMAL HIGH (ref 0–44)
AST: 46 U/L — ABNORMAL HIGH (ref 15–41)
Albumin: 2.4 g/dL — ABNORMAL LOW (ref 3.5–5.0)
Alkaline Phosphatase: 308 U/L — ABNORMAL HIGH (ref 38–126)
Anion gap: 10 (ref 5–15)
BUN: 17 mg/dL (ref 6–20)
CO2: 23 mmol/L (ref 22–32)
Calcium: 7.8 mg/dL — ABNORMAL LOW (ref 8.9–10.3)
Chloride: 102 mmol/L (ref 98–111)
Creatinine, Ser: 0.86 mg/dL (ref 0.61–1.24)
GFR, Estimated: >60 mL/min (ref >60)
Glucose, Bld: 161 mg/dL — ABNORMAL HIGH (ref 70–99)
Potassium: 4.1 mmol/L (ref 3.5–5.1)
Sodium: 135 mmol/L (ref 135–145)
Total Bilirubin: 2 mg/dL — ABNORMAL HIGH (ref 0.3–1.2)
Total Protein: 6.6 g/dL (ref 6.5–8.1)

## 2022-12-13 LAB — PROTIME-INR
INR: 1.4 — ABNORMAL HIGH (ref 0.8–1.2)
Prothrombin Time: 16.9 seconds — ABNORMAL HIGH (ref 11.4–15.2)

## 2022-12-13 LAB — PROCALCITONIN: Procalcitonin: 14.41 ng/mL

## 2022-12-13 LAB — GLUCOSE, CAPILLARY
Glucose-Capillary: 224 mg/dL — ABNORMAL HIGH (ref 70–99)
Glucose-Capillary: 229 mg/dL — ABNORMAL HIGH (ref 70–99)
Glucose-Capillary: 86 mg/dL (ref 70–99)
Glucose-Capillary: 312 mg/dL — ABNORMAL HIGH (ref 70–99)

## 2022-12-13 LAB — OSMOLALITY: Osmolality: 289 mOsm/kg (ref 275–295)

## 2022-12-13 MED ORDER — PANTOPRAZOLE SODIUM 40 MG IV SOLR
40.0000 mg | INTRAVENOUS | Status: DC
  Administered 2022-12-13 – 2022-12-15 (×3): 40 mg via INTRAVENOUS
  Filled 2022-12-13 (×3): qty 10

## 2022-12-13 MED ORDER — SODIUM CHLORIDE 0.9 % IV SOLN: INTRAVENOUS | Status: DC

## 2022-12-13 MED ORDER — IBUPROFEN 200 MG PO TABS
600.0000 mg | ORAL_TABLET | Freq: Three times a day (TID) | ORAL | Status: DC | PRN
  Administered 2022-12-15: 600 mg via ORAL
  Filled 2022-12-13: qty 3

## 2022-12-13 MED ORDER — INSULIN ASPART 100 UNIT/ML IJ SOLN
0.0000 [IU] | Freq: Three times a day (TID) | INTRAMUSCULAR | Status: DC
  Administered 2022-12-13: 5 [IU] via SUBCUTANEOUS
  Administered 2022-12-14: 3 [IU] via SUBCUTANEOUS
  Administered 2022-12-14: 2 [IU] via SUBCUTANEOUS
  Administered 2022-12-15: 3 [IU] via SUBCUTANEOUS
  Administered 2022-12-15: 5 [IU] via SUBCUTANEOUS
  Administered 2022-12-15 – 2022-12-16 (×2): 3 [IU] via SUBCUTANEOUS
  Administered 2022-12-16: 2 [IU] via SUBCUTANEOUS
  Administered 2022-12-16 – 2022-12-17 (×3): 3 [IU] via SUBCUTANEOUS
  Administered 2022-12-17 – 2022-12-18 (×3): 2 [IU] via SUBCUTANEOUS
  Administered 2022-12-18: 5 [IU] via SUBCUTANEOUS
  Administered 2022-12-19: 2 [IU] via SUBCUTANEOUS
  Administered 2022-12-19: 3 [IU] via SUBCUTANEOUS
  Administered 2022-12-20: 2 [IU] via SUBCUTANEOUS
  Administered 2022-12-20: 3 [IU] via SUBCUTANEOUS
  Administered 2022-12-20: 2 [IU] via SUBCUTANEOUS
  Administered 2022-12-21: 8 [IU] via SUBCUTANEOUS
  Administered 2022-12-21: 2 [IU] via SUBCUTANEOUS

## 2022-12-13 MED ORDER — INSULIN ASPART 100 UNIT/ML IJ SOLN
0.0000 [IU] | Freq: Every day | INTRAMUSCULAR | Status: DC
  Administered 2022-12-13: 4 [IU] via SUBCUTANEOUS
  Administered 2022-12-14: 2 [IU] via SUBCUTANEOUS
  Administered 2022-12-18: 3 [IU] via SUBCUTANEOUS
  Administered 2022-12-19: 2 [IU] via SUBCUTANEOUS

## 2022-12-13 MED ORDER — SODIUM CHLORIDE 0.9 % IV BOLUS
500.0000 mL | Freq: Once | INTRAVENOUS | Status: AC
  Administered 2022-12-13: 500 mL via INTRAVENOUS

## 2022-12-13 NOTE — Progress Notes (Signed)
PHARMACY - PHYSICIAN COMMUNICATION CRITICAL VALUE ALERT - BLOOD CULTURE IDENTIFICATION (BCID)  Lawrence Brock is an 52 y.o. male who presented to [facility] on 12/11/2022 with a chief complaint of sepsis secondary to hepatic abscess   Assessment:  1/4 GPR  Name of physician (or Provider) Contacted: Virgel Manifold  Current antibiotics: zosyn  Changes to prescribed antibiotics recommended:  None, probable contaminant  No results found for this or any previous visit.  Arley Phenix RPh 12/13/2022, 11:19 PM

## 2022-12-13 NOTE — TOC Initial Note (Signed)
Transition of Care (TOC) - Initial/Assessment Note    Patient Details  Name: Lawrence Brock MRN: [id number] Date of Birth: Aug 13, 1970  Transition of Care (TOC) CM/SW Contact:    Harriett Sine, RN Phone Number:[phone]  12/13/2022, 9:46 AM  Clinical Narrative:                  Pt from home, no TOC or SDOH needs at this time, following       Patient Goals and CMS Choice            Expected Discharge Plan and Services                                              Prior Living Arrangements/Services                       Activities of Daily Living Home Assistive Devices/Equipment: None ADL Screening (condition at time of admission) Patient's cognitive ability adequate to safely complete daily activities?: Yes Is the patient deaf or have difficulty hearing?: No Does the patient have difficulty seeing, even when wearing glasses/contacts?: No Does the patient have difficulty concentrating, remembering, or making decisions?: No Patient able to express need for assistance with ADLs?: Yes Does the patient have difficulty dressing or bathing?: No Independently performs ADLs?: Yes (appropriate for developmental age) Does the patient have difficulty walking or climbing stairs?: No Weakness of Legs: Both Weakness of Arms/Hands: None  Permission Sought/Granted                  Emotional Assessment              Admission diagnosis:  Hyponatremia [E87.1] Liver abscess [K75.0] Sepsis (HCC) [A41.9] Patient Active Problem List   Diagnosis Date Noted   Sepsis (HCC) 12/12/2022   Portal hypertensive gastropathy (HCC) 05/12/2022   Hypotension 05/11/2022   Internal hemorrhoid 05/11/2022   Acute GI bleeding 05/11/2022   Acute upper GI bleeding 05/10/2022   Syncope 05/10/2022   Abnormal finding on GI tract imaging 05/10/2022   Hematochezia 05/10/2022   Acute blood loss anemia 05/10/2022   Lactic acidosis 05/10/2022   Lower abdominal pain  05/10/2022   Chest pain 05/10/2022   Alcohol abuse, in remission 05/10/2022   Tobacco abuse 05/10/2022   Alcoholism in remission (HCC) 02/01/2022   Splenomegaly 05/30/2021   History of esophageal varices 08/13/2019   Hematemesis with nausea 08/19/2016   Bleeding esophageal varices (HCC)    Anemia, iron deficiency    Anemia 06/26/2015   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/03/2015   Bleeding gastrointestinal 12/29/2014   Alcoholic cirrhosis of liver without ascites (HCC) 12/29/2014   Portal vein thrombosis 12/29/2014   CAP (community acquired pneumonia) 10/31/2014   Pancytopenia (HCC) 10/31/2014   Pancreatitis 10/31/2014   Cholangitis    Biliary obstruction 02/22/2014   Common biliary duct stricture 02/22/2014   Common bile duct stricture 02/18/2014   Choledocholithiasis 02/18/2014   Hepatic abscess 02/16/2014   Elevated LFTs 02/15/2014   Other specified fever 02/15/2014   Duodenal ulcer 09/14/2013   Fever 09/10/2013   Hypokalemia 09/09/2013   Thrombocytopenia, unspecified (HCC) 09/09/2013   Normocytic anemia 09/09/2013   Pancreatic pseudocyst 09/09/2013   Unspecified constipation 09/09/2013   PCP:  Verlon Au, MD Pharmacy:   [facility] [address] [place] - [address] [phone]  [place] [address] Phone: [phone] Fax: [phone]     Social Determinants of Health (SDOH) Social History: SDOH Screenings   Food Insecurity: No Food Insecurity (12/12/2022)  Housing: Low Risk  (12/12/2022)  Transportation Needs: No Transportation Needs (12/12/2022)  Utilities: Not At Risk (12/12/2022)  Financial Resource Strain: Not on File (08/31/2018)   Received from [place], [place]  Physical Activity: Not on File (08/31/2018)   Received from [place], [place]  Social Connections: Unknown (08/03/2021)   Received from [facility]  Stress: Not on File (08/31/2018)   Received from [place], [place]  Tobacco Use: High Risk  (11/21/2022)   Received from [facility]   SDOH Interventions:     Readmission Risk Interventions     No data to display

## 2022-12-13 NOTE — Progress Notes (Signed)
Subjective: Feeling better.  Objective: Vital signs in last 24 hours: Temp:  [97.8 F (36.6 C)-100.7 F (38.2 C)] 98.4 F (36.9 C) (09/20 7322) Pulse Rate:  [79-114] 99 (09/20 0613) Resp:  [16-18] 18 (09/20 0613) BP: (83-118)/(66-106) 99/75 (09/20 0613) SpO2:  [93 %-98 %] 98 % (09/20 0613) Last BM Date : 12/12/22  Intake/Output from previous day: 09/19 0701 - 09/20 0700 In: 2228.8 [I.V.:2148.6; IV Piggyback:80.2] Out: 960 [Urine:960] Intake/Output this shift: No intake/output data recorded.  General appearance: alert and no distress GI: tender in the upper abdomen  Lab Results: Recent Labs    12/12/22 0024 12/12/22 1151 12/13/22 0608  WBC 16.1* 16.0* 14.4*  HGB 11.7* 10.4* 12.2*  HCT 34.3* 30.7* 36.7*  PLT 105* 80* 68*   BMET Recent Labs    12/12/22 0024 12/12/22 1151 12/13/22 0608  NA 125*  --  135  K 4.1  --  4.1  CL 92*  --  102  CO2 24  --  23  GLUCOSE 225*  --  161*  BUN 20  --  17  CREATININE 1.02 0.86 0.86  CALCIUM 8.5*  --  7.8*   LFT Recent Labs    12/13/22 0608  PROT 6.6  ALBUMIN 2.4*  AST 46*  ALT 48*  ALKPHOS 308*  BILITOT 2.0*   PT/INR Recent Labs    12/13/22 0608  LABPROT 16.9*  INR 1.4*   Hepatitis Panel No results for input(s): "HEPBSAG", "HCVAB", "HEPAIGM", "HEPBIGM" in the last 72 hours. C-Diff No results for input(s): "CDIFFTOX" in the last 72 hours. Fecal Lactopherrin No results for input(s): "FECLLACTOFRN" in the last 72 hours.  Studies/Results: CT ABDOMEN PELVIS W CONTRAST  Result Date: 12/12/2022 CLINICAL DATA:  History of mass-forming chronic pancreatitis with three day history of right upper abdominal pain associated with fever EXAM: CT ABDOMEN AND PELVIS WITH CONTRAST TECHNIQUE: Multidetector CT imaging of the abdomen and pelvis was performed using the standard protocol following bolus administration of intravenous contrast. RADIATION DOSE REDUCTION: This exam was performed according to the departmental  dose-optimization program which includes automated exposure control, adjustment of the mA and/or kV according to patient size and/or use of iterative reconstruction technique. CONTRAST:  OMNIPAQUE IOHEXOL 300 MG/ML  SOLN COMPARISON:  MR abdomen dated 10/15/2022, CT abdomen and pelvis dated 09/19/2022, CTA abdomen and pelvis dated 05/10/2022 FINDINGS: Lower chest: No focal consolidation or pulmonary nodule in the lung bases. No pleural effusion or pneumothorax demonstrated. Partially imaged heart size is normal. Hepatobiliary: Interval development of multiloculated cystic focus centered within segment 7 measuring 4.5 x 3.4 x 7.6 cm (2:15, 7:88) With surrounding parenchymal hyperenhancement. Persistent intra hepatic bile duct dilation to the level of the portal confluence, where long segment narrowing of the common bile duct is again seen. Normal gallbladder. Pancreas: Sequela of chronic pancreatitis with diffuse parenchymal atrophy and bulky stone in the region of the pancreatic head. Spleen: Similar enlargement, 17.5 cm in the AP dimension. Adrenals/Urinary Tract: No adrenal nodules. No suspicious renal mass, calculi or hydronephrosis. Bilateral simple cysts. No specific follow-up imaging recommended. No focal bladder wall thickening. Stomach/Bowel: Normal appearance of the stomach. No evidence of bowel wall thickening, distention, or inflammatory changes. Appendix is not discretely seen. 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 Aortic atherosclerosis. No enlarged abdominal or pelvic lymph nodes. Reproductive: Prostate is unremarkable. Other: No free fluid, fluid collection, or free air. Musculoskeletal: No acute or abnormal lytic or  blastic osseous lesions. IMPRESSION: 1. Interval development of multiloculated cystic focus centered within segment 7 measuring 4.5 x 3.4 x 7.6 cm with surrounding parenchymal  hyperenhancement, suspicious for hepatic abscess. 2. Persistent intra hepatic bile duct dilation to the level of the portal confluence, where long segment narrowing of the common bile duct is again seen. 3. Sequela of chronic pancreatitis with diffuse parenchymal atrophy and bulky stone in the region of the pancreatic head. 4. Similar splenomegaly. 5. 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 6.  Aortic Atherosclerosis (ICD10-I70.0). Electronically Signed   By: Agustin Cree M.D.   On: 12/12/2022 10:39   US Abdomen Complete  Result Date: 12/12/2022 CLINICAL DATA:  Upper abdominal pain EXAM: ABDOMEN ULTRASOUND COMPLETE COMPARISON:  CT abdomen and pelvis 08/19/2016 FINDINGS: Gallbladder: No gallstones or wall thickening visualized. No sonographic Murphy sign noted by sonographer. Common bile duct: Diameter: 3.7 mm Liver: No focal lesion identified. Within normal limits in parenchymal echogenicity. Portal vein is patent on color Doppler imaging with normal direction of blood flow towards the liver. IVC: No abnormality visualized. Pancreas: Visualized portion unremarkable. Spleen: Size and appearance within normal limits. Right Kidney: Length: 9.6 cm. Echogenicity within normal limits. No mass or hydronephrosis visualized. Left Kidney: Length: 10.4 cm. Echogenicity within normal limits. No mass or hydronephrosis visualized. There is a 2.5 cm simple cyst left kidney. No imaging follow-up needed. Abdominal aorta: No aneurysm visualized. Other findings: None. IMPRESSION: No cholelithiasis or sonographic evidence for acute cholecystitis. Electronically Signed   By: Annia Belt M.D.   On: 12/12/2022 08:06    Medications: Scheduled:  enoxaparin (LOVENOX) injection  40 mg Subcutaneous Daily   nicotine  14 mg Transdermal Daily   Continuous:  sodium chloride Stopped (12/12/22 1711)   lactated ringers 150 mL/hr (12/13/22 0548)   piperacillin-tazobactam (ZOSYN)  IV  12.5 mL/hr at 12/13/22 0041    Assessment/Plan: 1) Large multiloculated hepatic abscess. 2) Fever - Tmax 100.7 at 2341. 3) Cirrhosis. 4) Biliary stricture.   He is well today and he reports feeling better.  His Tmax last evening was at 100.7.  In the past, when he reported having a hepatic abscess no drainage was performed as he reported that it was small.  It is much more complex at this time.  Plan: 1) Continue with Zosyn. 2) Await IR and Surgical input.  LOS: 1 day   Alberto Schoch D 12/13/2022, 7:11 AM

## 2022-12-13 NOTE — Progress Notes (Signed)
   12/12/22 2340  Assess: MEWS Score  Temp (!) 100.7 F (38.2 C)  BP 105/75  MAP (mmHg) 84  Pulse Rate (!) 112  Resp 16  SpO2 94 %  Assess: MEWS Score  MEWS Temp 1  MEWS Systolic 0  MEWS Pulse 2  MEWS RR 0  MEWS LOC 0  MEWS Score 3  MEWS Score Color Yellow  Assess: SIRS CRITERIA  SIRS Temperature  0  SIRS Pulse 1  SIRS Respirations  0  SIRS WBC 0  SIRS Score Sum  1   Informed charge nurse, reduced temperature in room, removed blankets, and administered tylenol to reduce body temperature.

## 2022-12-13 NOTE — Plan of Care (Signed)
Problem: Fluid Volume: Goal: Hemodynamic stability will improve Outcome: Not Progressing   Problem: Clinical Measurements: Goal: Diagnostic test results will improve Outcome: Not Progressing Goal: Signs and symptoms of infection will decrease Outcome: Not Progressing   Problem: Respiratory: Goal: Ability to maintain adequate ventilation will improve Outcome: Not Progressing   Problem: Education: Goal: Knowledge of General Education information will improve Description: Including pain rating scale, medication(s)/side effects and non-pharmacologic comfort measures Outcome: Not Progressing   Problem: Health Behavior/Discharge Planning: Goal: Ability to manage health-related needs will improve Outcome: Not Progressing   Problem: Clinical Measurements: Goal: Ability to maintain clinical measurements within normal limits will improve Outcome: Not Progressing Goal: Will remain free from infection Outcome: Not Progressing Goal: Diagnostic test results will improve Outcome: Not Progressing Goal: Respiratory complications will improve Outcome: Not Progressing Goal: Cardiovascular complication will be avoided Outcome: Not Progressing   Problem: Activity: Goal: Risk for activity intolerance will decrease Outcome: Not Progressing   Problem: Nutrition: Goal: Adequate nutrition will be maintained Outcome: Not Progressing   Problem: Coping: Goal: Level of anxiety will decrease Outcome: Not Progressing   Problem: Elimination: Goal: Will not experience complications related to bowel motility Outcome: Not Progressing Goal: Will not experience complications related to urinary retention Outcome: Not Progressing   Problem: Pain Managment: Goal: General experience of comfort will improve Outcome: Not Progressing   Problem: Safety: Goal: Ability to remain free from injury will improve Outcome: Not Progressing   Problem: Skin Integrity: Goal: Risk for impaired skin integrity  will decrease Outcome: Not Progressing

## 2022-12-13 NOTE — Progress Notes (Signed)
Patient ID: Lawrence Brock, male   DOB: 05-Nov-1970, 52 y.o.   MRN: [id number] Due to emergent GI bleed case in IR patient's hepatic aspiration/drain placement has been postponed until 9/21.  Nurse aware.

## 2022-12-13 NOTE — Progress Notes (Signed)
PROGRESS NOTE    Lawrence Brock  MWN:[id number] DOB: [birth year] DOA: 12/11/2022 PCP: Verlon Au, MD   Brief Narrative:  Lawrence Brock is a 52 y.o. 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  Started on antibiotics, GI consulted.   Assessment & Plan:   Principal Problem:   Sepsis (HCC) Active Problems:   Thrombocytopenia, unspecified (HCC)   Tobacco abuse   Hepatic abscess   Common biliary duct stricture   Pancytopenia (HCC)   Portal vein thrombosis   Splenomegaly  Sepsis secondary to hepatic abscess, POA: Patient met criteria for sepsis based on tachycardia, leukocytosis and fever.  Lactic acid is normal.  Cultures have been drawn in the ED and negative thus far, blood pressure slightly on the lower side but he is asymptomatic from that perspective and lactic acid was normal.  Started on Zosyn, evaluated by GI.  General surgery and IR consulted.  General surgery recommends IR aspiration and IR is going to perform the procedure today.  Patient denies any history of recent travel outside the country.   Acute hyponatremia: Presented with 125.  Now 135.  Was likely hypovolemic.   new onset type 2 diabetes mellitus: Appears to have history of prediabetes with hemoglobin A1c of 5.9 almost 10 months ago.  Significantly hyperglycemic in the ED upon presentation, hemoglobin A1c this time 8.4.  Will start on SSI, consult diabetes coordinator.   Chronic pancreatitis: Noted.  Continue pain medications.   Anemia of chronic disease: Hemoglobin appears to be stable.   Tobacco dependence: Smokes half pack of cigarettes a day.  Nicotine patch ordered.  Counseling provided to quit.    DVT prophylaxis: enoxaparin  (LOVENOX) injection 40 mg Start: 12/12/22 1400   Code Status: Full Code  Family Communication:  None present at bedside.  Plan of care discussed with patient in length and he/she verbalized understanding and agreed with it.  Status is: Inpatient Remains inpatient appropriate because: Plan for IR drainage of the hepatic abscess.   Estimated body mass index is 18.25 kg/m as calculated from the following:   Height as of this encounter: 5\' 8"  (1.727 m).   Weight as of this encounter: 54.4 kg.    Nutritional Assessment: Body mass index is 18.25 kg/m.Marland Kitchen Seen by dietician.  I agree with the assessment and plan as outlined below: Nutrition Status:        . Skin Assessment: I have examined the patient's skin and I agree with the wound assessment as performed by the wound care RN as outlined below:    Consultants:  GI, general surgery, IR  Procedures:  As above  Antimicrobials:  Anti-infectives (From admission, onward)    Start     Dose/Rate Route Frequency Ordered Stop   12/12/22 1600  piperacillin-tazobactam (ZOSYN) IVPB 3.375 g        3.375 g 12.5 mL/hr over 240 Minutes Intravenous Every 8 hours 12/12/22 1304     12/12/22 0745  piperacillin-tazobactam (ZOSYN) IVPB 3.375 g        3.375 g 100 mL/hr over 30 Minutes Intravenous  Once 12/12/22 0737 12/12/22 1154         Subjective: Patient seen and examined.  Feels well.  No abdominal pain or any other complaint.  Objective: Vitals:  12/13/22 0313 12/13/22 0419 12/13/22 0613 12/13/22 0734  BP: (!) 87/67 (!) 83/66 99/75 100/72  Pulse: 82 79 99 (!) 102  Resp: 16 16 18 20   Temp: 97.9 F (36.6 C)  98.4 F (36.9 C) 98.4 F (36.9 C)  TempSrc: Oral  Oral Oral  SpO2: 97% 98% 98% 96%  Weight:      Height:        Intake/Output Summary (Last 24 hours) at 12/13/2022 0737 Last data filed at 12/13/2022 0456 Gross per 24 hour  Intake 2228.76 ml  Output 960 ml  Net 1268.76 ml   Filed Weights   12/12/22 0010  Weight:  54.4 kg    Examination:  General exam: Appears calm and comfortable  Respiratory system: Clear to auscultation. Respiratory effort normal. Cardiovascular system: S1 & S2 heard, RRR. No JVD, murmurs, rubs, gallops or clicks. No pedal edema. Gastrointestinal system: Abdomen is nondistended, soft and nontender. No organomegaly or masses felt. Normal bowel sounds heard. Central nervous system: Alert and oriented. No focal neurological deficits. Extremities: Symmetric 5 x 5 power. Skin: No rashes, lesions or ulcers Psychiatry: Judgement and insight appear normal. Mood & affect appropriate.    Data Reviewed: I have personally reviewed following labs and imaging studies  CBC: Recent Labs  Lab 12/12/22 0024 12/12/22 1151 12/13/22 0608  WBC 16.1* 16.0* 14.4*  NEUTROABS 14.7*  --   --   HGB 11.7* 10.4* 12.2*  HCT 34.3* 30.7* 36.7*  MCV 86.0 86.5 87.6  PLT 105* 80* 68*   Basic Metabolic Panel: Recent Labs  Lab 12/12/22 0024 12/12/22 1151 12/13/22 0608  NA 125*  --  135  K 4.1  --  4.1  CL 92*  --  102  CO2 24  --  23  GLUCOSE 225*  --  161*  BUN 20  --  17  CREATININE 1.02 0.86 0.86  CALCIUM 8.5*  --  7.8*  MG  --  1.7  --    GFR: Estimated Creatinine Clearance: 78.2 mL/min (by C-G formula based on SCr of 0.86 mg/dL). Liver Function Tests: Recent Labs  Lab 12/12/22 0024 12/13/22 0608  AST 20 46*  ALT 34 48*  ALKPHOS 407* 308*  BILITOT 2.5* 2.0*  PROT 7.6 6.6  ALBUMIN 3.0* 2.4*   Recent Labs  Lab 12/12/22 0024  LIPASE 19   No results for input(s): "AMMONIA" in the last 168 hours. Coagulation Profile: Recent Labs  Lab 12/13/22 0608  INR 1.4*   Cardiac Enzymes: No results for input(s): "CKTOTAL", "CKMB", "CKMBINDEX", "TROPONINI" in the last 168 hours. BNP (last 3 results) No results for input(s): "PROBNP" in the last 8760 hours. HbA1C: Recent Labs    12/12/22 1151  HGBA1C 8.4*   CBG: Recent Labs  Lab 12/12/22 0042  GLUCAP 233*   Lipid  Profile: No results for input(s): "CHOL", "HDL", "LDLCALC", "TRIG", "CHOLHDL", "LDLDIRECT" in the last 72 hours. Thyroid Function Tests: No results for input(s): "TSH", "T4TOTAL", "FREET4", "T3FREE", "THYROIDAB" in the last 72 hours. Anemia Panel: No results for input(s): "VITAMINB12", "FOLATE", "FERRITIN", "TIBC", "IRON", "RETICCTPCT" in the last 72 hours. Sepsis Labs: Recent Labs  Lab 12/12/22 0031 12/12/22 0813 12/13/22 1610  PROCALCITON  --   --  14.41  LATICACIDVEN 1.1 1.5  --     Recent Results (from the past 240 hour(s))  SARS Coronavirus 2 by RT PCR (hospital order, performed in [facility] hospital lab) *cepheid single result test* Anterior Nasal Swab     Status: None  Collection Time: 12/12/22  8:32 AM   Specimen: Anterior Nasal Swab  Result Value Ref Range Status   SARS Coronavirus 2 by RT PCR NEGATIVE NEGATIVE Final    Comment: (NOTE) SARS-CoV-2 target nucleic acids are NOT DETECTED.  The SARS-CoV-2 RNA is generally detectable in upper and lower respiratory specimens during the acute phase of infection. The lowest concentration of SARS-CoV-2 viral copies this assay can detect is 250 copies / mL. A negative result does not preclude SARS-CoV-2 infection and should not be used as the sole basis for treatment or other patient management decisions.  A negative result may occur with improper specimen collection / handling, submission of specimen other than nasopharyngeal swab, presence of viral mutation(s) within the areas targeted by this assay, and inadequate number of viral copies (<250 copies / mL). A negative result must be combined with clinical observations, patient history, and epidemiological information.  Fact Sheet for Patients:   RoadLapTop.co.za  Fact Sheet for Healthcare Providers: http://kim-miller.com/  This test is not yet approved or  cleared by the [place] FDA and has been authorized for detection  and/or diagnosis of SARS-CoV-2 by FDA under an Emergency Use Authorization (EUA).  This EUA will remain in effect (meaning this test can be used) for the duration of the COVID-19 declaration under Section 564(b)(1) of the Act, 21 U.S.C. section 360bbb-3(b)(1), unless the authorization is terminated or revoked sooner.  Performed at [facility], [address], [place], [place] [address]      Radiology Studies: CT ABDOMEN PELVIS W CONTRAST  Result Date: 12/12/2022 CLINICAL DATA:  History of mass-forming chronic pancreatitis with three day history of right upper abdominal pain associated with fever EXAM: CT ABDOMEN AND PELVIS WITH CONTRAST TECHNIQUE: Multidetector CT imaging of the abdomen and pelvis was performed using the standard protocol following bolus administration of intravenous contrast. RADIATION DOSE REDUCTION: This exam was performed according to the departmental dose-optimization program which includes automated exposure control, adjustment of the mA and/or kV according to patient size and/or use of iterative reconstruction technique. CONTRAST:  OMNIPAQUE IOHEXOL 300 MG/ML  SOLN COMPARISON:  MR abdomen dated 10/15/2022, CT abdomen and pelvis dated 09/19/2022, CTA abdomen and pelvis dated 05/10/2022 FINDINGS: Lower chest: No focal consolidation or pulmonary nodule in the lung bases. No pleural effusion or pneumothorax demonstrated. Partially imaged heart size is normal. Hepatobiliary: Interval development of multiloculated cystic focus centered within segment 7 measuring 4.5 x 3.4 x 7.6 cm (2:15, 7:88) With surrounding parenchymal hyperenhancement. Persistent intra hepatic bile duct dilation to the level of the portal confluence, where long segment narrowing of the common bile duct is again seen. Normal gallbladder. Pancreas: Sequela of chronic pancreatitis with diffuse parenchymal atrophy and bulky stone in the region of the pancreatic head. Spleen: Similar  enlargement, 17.5 cm in the AP dimension. Adrenals/Urinary Tract: No adrenal nodules. No suspicious renal mass, calculi or hydronephrosis. Bilateral simple cysts. No specific follow-up imaging recommended. No focal bladder wall thickening. Stomach/Bowel: Normal appearance of the stomach. No evidence of bowel wall thickening, distention, or inflammatory changes. Appendix is not discretely seen. 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 Aortic atherosclerosis. No enlarged abdominal or pelvic lymph nodes. Reproductive: Prostate is unremarkable. Other: No free fluid, fluid collection, or free air. Musculoskeletal: No acute or abnormal lytic or blastic osseous lesions. IMPRESSION: 1. Interval development of multiloculated cystic focus centered within segment 7 measuring 4.5 x 3.4 x 7.6 cm  with surrounding parenchymal hyperenhancement, suspicious for hepatic abscess. 2. Persistent intra hepatic bile duct dilation to the level of the portal confluence, where long segment narrowing of the common bile duct is again seen. 3. Sequela of chronic pancreatitis with diffuse parenchymal atrophy and bulky stone in the region of the pancreatic head. 4. Similar splenomegaly. 5. 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 6.  Aortic Atherosclerosis (ICD10-I70.0). Electronically Signed   By: Agustin Cree M.D.   On: 12/12/2022 10:39   US Abdomen Complete  Result Date: 12/12/2022 CLINICAL DATA:  Upper abdominal pain EXAM: ABDOMEN ULTRASOUND COMPLETE COMPARISON:  CT abdomen and pelvis 08/19/2016 FINDINGS: Gallbladder: No gallstones or wall thickening visualized. No sonographic Murphy sign noted by sonographer. Common bile duct: Diameter: 3.7 mm Liver: No focal lesion identified. Within normal limits in parenchymal echogenicity. Portal vein is patent on color Doppler imaging with normal direction  of blood flow towards the liver. IVC: No abnormality visualized. Pancreas: Visualized portion unremarkable. Spleen: Size and appearance within normal limits. Right Kidney: Length: 9.6 cm. Echogenicity within normal limits. No mass or hydronephrosis visualized. Left Kidney: Length: 10.4 cm. Echogenicity within normal limits. No mass or hydronephrosis visualized. There is a 2.5 cm simple cyst left kidney. No imaging follow-up needed. Abdominal aorta: No aneurysm visualized. Other findings: None. IMPRESSION: No cholelithiasis or sonographic evidence for acute cholecystitis. Electronically Signed   By: Annia Belt M.D.   On: 12/12/2022 08:06    Scheduled Meds:  enoxaparin (LOVENOX) injection  40 mg Subcutaneous Daily   nicotine  14 mg Transdermal Daily   Continuous Infusions:  sodium chloride Stopped (12/12/22 1711)   lactated ringers 150 mL/hr (12/13/22 0548)   piperacillin-tazobactam (ZOSYN)  IV 12.5 mL/hr at 12/13/22 0041     LOS: 1 day   Hughie Closs, MD [facility]  12/13/2022, 7:37 AM   *Please note that this is a verbal dictation therefore any spelling or grammatical errors are due to the "Dragon Medical One" system interpretation.  Please page via Amion and do not message via secure chat for urgent patient care matters. Secure chat can be used for non urgent patient care matters.  How to contact the [facility] Attending or Consulting provider 7A - 7P or covering provider during after hours 7P -7A, for this patient?  Check the care team in [facility] and look for a) attending/consulting [facility] provider listed and b) the [facility] team listed. Page or secure chat 7A-7P. Log into [email or web address] and use 's universal password to access. If you do not have the password, please contact the hospital operator. Locate the [facility] provider you are looking for under [facility] and page to a number that you can be directly reached. If you still have difficulty reaching the provider, please page the  [facility] (Director on Call) for the Hospitalists listed on amion for assistance.

## 2022-12-13 NOTE — Inpatient Diabetes Management (Signed)
Inpatient Diabetes Program Recommendations  AACE/ADA: New Consensus Statement on Inpatient Glycemic Control (2015)  Target Ranges:  Prepandial:   less than 140 mg/dL      Peak postprandial:   less than 180 mg/dL (1-2 hours)      Critically ill patients:  140 - 180 mg/dL   Lab Results  Component Value Date   GLUCAP 229 (H) 12/13/2022   HGBA1C 8.4 (H) 12/12/2022    Review of Glycemic Control  Diabetes history: DM2 Outpatient Diabetes medications: metformin 1000 mg QD Current orders for Inpatient glycemic control: Novolog 0-15 TID with meals and 0-5 HS  HgbA1C - 8.4% Sepsis d/t liver abscess  Inpatient Diabetes Program Recommendations:    Spoke with pt at bedside regarding his diabetes and HgbA1C of 8.4% (average blood sugar of 194 mg/dL. Has had diabetes education in the past. States he knows what to do, just needs motivation to do it.  Pt confirms home meds, metformin 1000 mg every day, although does not monitor blood sugars on a regular basis. States blood sugars are usually in low 200s when he checks. Eats high sugary foods, sweets at times. Discussed glucose and A1C goals. Discussed importance of checking CBGs and maintaining good CBG control to prevent long-term and short-term complications. Explained how hyperglycemia leads to damage within blood vessels which lead to the common complications seen with uncontrolled diabetes. Stressed to the patient the importance of improving glycemic control to prevent further complications from uncontrolled diabetes. Discussed impact of nutrition, exercise, stress, sickness, and medications on diabetes control. Sees PCP every 3 months for diabetes management. Pt states he will start checking blood sugars daily, at various times (am, hs, 2H after meals) and take meter to PCP appt.   Recommend increasing metformin to 1000 mg BID  Focus on healthy eating, limiting sweets and carbs, trying to start walking daily, and monitoring blood  sugars.  Answered all questions and pt verbalized understanding. To f/u with PCP.  Thank you. Ailene Ards, RD, LDN, [facility] Inpatient Diabetes Coordinator [phone]

## 2022-12-13 NOTE — Consult Note (Signed)
Lawrence Brock 06-22-1970  [id number].    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 Doing well until about 4 days ago. At  that time he developed RUQ pain as well as fever up to 102.   ED work up included CT scan which showed 4.5 x 3.4 x 7.6 cm likely hepatic abscess, persistent intra hepatic bile duct dilation with long segment narrowing of the common bile duct. Initial lab work with leukocytosis WBC 16.1 and elevated LFTs with alk phos 407 and Tbili 2.5. Patient was admitted to the medical service and started on IV zosyn. GI has seen with recommendations for IR and surgery consults.  Prior Abdominal Surgeries: appendectomy Blood Thinners: Prior, none currently.  Allergies: Tobacco Use: Daily smoker Alcohol Use: Former alcohol abuse, quit 4 years ago  ROS: ROS As above, see hpi  Family History  Problem Relation Age of Onset   Hypertension Mother     Past Medical History:  Diagnosis Date   Abdominal pain    intermittent abdominal pain in lower abdomen   Anemia    Cholangitis 01/2014   with liver abscess   Chronic back pain    Chronic calcific pancreatitis (HCC) 03/2005   with dilated pancreatic duct. pseudocyst in 08/2013. Follwed by Dr Rob Bunting and Rise Mu (at [facility]).     Chronic pancreatitis (HCC) 09/10/2013   Fatty liver    cirrhosis of liver never  confirmed.    Hypertension    No meds yet.   Liver abscess 01/2014   Pneumonia    10-30-14 [facility] hospital admission, no problems now.   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    Thrombocytopenia (HCC) 2015   with splenomegaly 03/2014.  s/p bone marrow biopsy, nonspecific findings 09/2015    Past Surgical History:  Procedure Laterality Date   APPENDECTOMY     BIOPSY  05/11/2022   Procedure: BIOPSY;  Surgeon: Jenel Lucks, MD;  Location[facility] ENDOSCOPY;  Service: Gastroenterology;;   COLONOSCOPY WITH PROPOFOL N/A 01/05/2015   Rachael Fee, MD;  Thre 7 to 13 mm tubular adenomatous polyps removed. repeat colonoscopy suggested in 12/2017.     ENDOSCOPIC RETROGRADE CHOLANGIOPANCREATOGRAPHY (ERCP) WITH PROPOFOL N/A 06/02/2014   Dr Rob Bunting.  removed covered metal stent.  no stricture noted.  Rec: biliary diversion surgery if recurrent stricture arises.   ERCP N/A 02/22/2014   Meryl Dare, MD; with removal of plastic stent, replaced with metal biliary stent.  filling defect: stone, sludge, blood clots removed with baloon sweep.     ERCP N/A 02/18/2014   For suspected cholangitis. Louis Meckel, MD; s/p sphincterotomy, blood clots and sludge swept out, remnant of pancreatic duct noted (removal not mentioned.  plastic stent placed into CBD.     ERCP, plastic pancreatic duct stent placement.  2007   Dr Danny Lawless at [facility].    ESOPHAGEAL BANDING  05/11/2022   Procedure: ESOPHAGEAL BANDING;  Surgeon: Jenel Lucks, MD;  Location: [facility] ENDOSCOPY;  Service: Gastroenterology;;   ESOPHAGOGASTRODUODENOSCOPY N/A 05/11/2022   Procedure: ESOPHAGOGASTRODUODENOSCOPY (EGD);  Surgeon: Jenel Lucks, MD;  Location: [facility] ENDOSCOPY;  Service: Gastroenterology;  Laterality: N/A;   ESOPHAGOGASTRODUODENOSCOPY (EGD) WITH PROPOFOL N/A 09/14/2013   Dr Rhea Belton.  erosive gastritis.  bulbar ulcer.  removal of plastic biliaty stent.     ESOPHAGOGASTRODUODENOSCOPY (EGD)  WITH PROPOFOL N/A 01/05/2015   for melena. Rachael Fee.  Large distal esophageal varices, no bleeding stigmata.  no gastric varices, not banded.  Moderate to severe portal hypertensive gastropathy.  Nadolol initiated.    ESOPHAGOGASTRODUODENOSCOPY (EGD) WITH PROPOFOL N/A 08/19/2016   Armbruster, Reeves Forth, MD; Large esophageal varices with red whale signs, high risk for bleeding. 11 bands placed.  Portal hypertensive gastropathy.    FLEXIBLE SIGMOIDOSCOPY N/A 05/11/2022   Procedure: FLEXIBLE SIGMOIDOSCOPY;  Surgeon: Jenel Lucks, MD;  Location: [facility] ENDOSCOPY;  Service: Gastroenterology;  Laterality: N/A;   GASTROINTESTINAL STENT REMOVAL N/A 09/14/2013   Beverley Fiedler, MD; Pancreatic stent removal    Social History:  reports that he has been smoking cigarettes. He has never used smokeless tobacco. He reports that he does not drink alcohol and does not use drugs.  Allergies: No Known Allergies  Medications Prior to Admission  Medication Sig Dispense Refill   spironolactone (ALDACTONE) 50 MG tablet Take 50 mg by mouth daily.     triamcinolone cream (KENALOG) 0.5 % Apply topically.     dicyclomine (BENTYL) 20 MG tablet Take 20 mg by mouth in the morning and at bedtime.     FLUoxetine (PROZAC) 10 MG tablet Take 10 mg by mouth daily.     HYDROcodone-acetaminophen (NORCO) 7.5-325 MG tablet Take 1 tablet by mouth every 6 (six) hours as needed for moderate pain.     metFORMIN (GLUCOPHAGE) 1000 MG tablet Take 1 tablet by mouth daily with breakfast.     methocarbamol (ROBAXIN) 500 MG tablet Take 500 mg by mouth 3 (three) times daily.     nicotine (NICODERM CQ - DOSED IN MG/24 HOURS) 21 mg/24hr patch Place 1 patch (21 mg total) onto the skin daily. 28 patch 0   pantoprazole (PROTONIX) 40 MG tablet Take 1 tablet (40 mg total) by mouth 2 (two) times daily for 30 days, THEN 1 tablet (40 mg total) daily. 120 tablet 0   polyethylene glycol powder (MIRALAX) 17 GM/SCOOP powder Take 17 g by mouth 2  (two) times daily as needed for moderate constipation or mild constipation. 255 g 2   propranolol (INDERAL) 20 MG tablet Take 1 tablet (20 mg total) by mouth 2 (two) times daily. 180 tablet 0   SYMBICORT 160-4.5 MCG/ACT inhaler Inhale 2 puffs into the lungs 2 (two) times daily.       Physical Exam: Blood pressure 98/61, pulse (!) 111, temperature 99 F (37.2 C), temperature source Oral, resp. rate 20, height 5\' 8"  (1.727 m), weight 54.4 kg, SpO2 96%. Gen:  Alert, NAD, pleasant Card:  Tachycardic Pulm:  Rate and effort normal Abd: mild to moderate distension but soft and NT on my exam. No rigidity or guarding. +BS Psych: A&Ox3    Results for orders placed or performed during the hospital encounter of 12/11/22 (from the past 48 hour(s))  Urinalysis, w/ Reflex to Culture (Infection Suspected) -Urine, Clean Catch     Status: Abnormal   Collection Time: 12/12/22 12:12 AM  Result Value Ref Range   Specimen Source URINE, CLEAN CATCH    Color, Urine YELLOW YELLOW  APPearance CLEAR CLEAR   Specific Gravity, Urine 1.017 1.005 - 1.030   pH 5.0 5.0 - 8.0   Glucose, UA >=500 (A) NEGATIVE mg/dL   Hgb urine dipstick NEGATIVE NEGATIVE   Bilirubin Urine NEGATIVE NEGATIVE   Ketones, ur 5 (A) NEGATIVE mg/dL   Protein, ur 30 (A) NEGATIVE mg/dL   Nitrite NEGATIVE NEGATIVE   Leukocytes,Ua NEGATIVE NEGATIVE   RBC / HPF 0-5 0 - 5 RBC/hpf   WBC, UA 0-5 0 - 5 WBC/hpf    Comment:        Reflex urine culture not performed if WBC <=10, OR if Squamous epithelial cells >5. If Squamous epithelial cells >5 suggest recollection.    Bacteria, UA NONE SEEN NONE SEEN   Squamous Epithelial / HPF 0-5 0 - 5 /HPF   Mucus PRESENT     Comment: Performed at [facility], [address] [place], [place] [address]  Osmolality, urine     Status: None   Collection Time: 12/12/22 12:12 AM  Result Value Ref Range   Osmolality, Ur 734 300 - 900 mOsm/kg    Comment: REPEATED TO VERIFY Performed at  [facility], [address], [place], [place] [address]   Sodium, urine, random     Status: None   Collection Time: 12/12/22 12:12 AM  Result Value Ref Range   Sodium, Ur 133 mmol/L    Comment: Performed at [facility], [address] [place], [place] [address]  Comprehensive metabolic panel     Status: Abnormal   Collection Time: 12/12/22 12:24 AM  Result Value Ref Range   Sodium 125 (L) 135 - 145 mmol/L   Potassium 4.1 3.5 - 5.1 mmol/L   Chloride 92 (L) 98 - 111 mmol/L   CO2 24 22 - 32 mmol/L   Glucose, Bld 225 (H) 70 - 99 mg/dL    Comment: Glucose reference range applies only to samples taken after fasting for at least 8 hours.   BUN 20 6 - 20 mg/dL   Creatinine, Ser 4.25 0.61 - 1.24 mg/dL   Calcium 8.5 (L) 8.9 - 10.3 mg/dL   Total Protein 7.6 6.5 - 8.1 g/dL   Albumin 3.0 (L) 3.5 - 5.0 g/dL   AST 20 15 - 41 U/L   ALT 34 0 - 44 U/L   Alkaline Phosphatase 407 (H) 38 - 126 U/L   Total Bilirubin 2.5 (H) 0.3 - 1.2 mg/dL   GFR, Estimated >95 >63 mL/min    Comment: (NOTE) Calculated using the CKD-EPI Creatinine Equation (2021)    Anion gap 9 5 - 15    Comment: Performed at [facility], [address], [place], [place] [address]  CBC with Differential     Status: Abnormal   Collection Time: 12/12/22 12:24 AM  Result Value Ref Range   WBC 16.1 (H) 4.0 - 10.5 K/uL   RBC 3.99 (L) 4.22 - 5.81 MIL/uL   Hemoglobin 11.7 (L) 13.0 - 17.0 g/dL   HCT 33.2 (L) 95.1 - 88.4 %   MCV 86.0 80.0 - 100.0 fL   MCH 29.3 26.0 - 34.0 pg   MCHC 34.1 30.0 - 36.0 g/dL   RDW 16.6 06.3 - 01.6 %   Platelets 105 (L) 150 - 400 K/uL    Comment: Immature Platelet Fraction may be clinically indicated, consider ordering this additional test [id number]    nRBC 0.0 0.0 - 0.2 %   Neutrophils Relative % 90 %  Neutro Abs 14.7 (H) 1.7 - 7.7 K/uL   Lymphocytes Relative 3 %   Lymphs Abs 0.4 (L) 0.7 - 4.0 K/uL   Monocytes Relative 6 %   Monocytes Absolute 0.9 0.1 -  1.0 K/uL   Eosinophils Relative 0 %   Eosinophils Absolute 0.0 0.0 - 0.5 K/uL   Basophils Relative 0 %   Basophils Absolute 0.0 0.0 - 0.1 K/uL   Immature Granulocytes 1 %   Abs Immature Granulocytes 0.16 (H) 0.00 - 0.07 K/uL    Comment: Performed at [facility], [address], [place], [place] [address]  Lipase, blood     Status: None   Collection Time: 12/12/22 12:24 AM  Result Value Ref Range   Lipase 19 11 - 51 U/L    Comment: Performed at [facility], [address], [place], [place] [address]  I-Stat Lactic Acid, ED     Status: None   Collection Time: 12/12/22 12:31 AM  Result Value Ref Range   Lactic Acid, Venous 1.1 0.5 - 1.9 mmol/L  CBG monitoring, ED     Status: Abnormal   Collection Time: 12/12/22 12:42 AM  Result Value Ref Range   Glucose-Capillary 233 (H) 70 - 99 mg/dL    Comment: Glucose reference range applies only to samples taken after fasting for at least 8 hours.  Blood culture (routine x 2)     Status: None (Preliminary result)   Collection Time: 12/12/22  8:04 AM   Specimen: BLOOD  Result Value Ref Range   Specimen Description      BLOOD SITE NOT SPECIFIED Performed at [facility], [address] [place], [place] [address]    Special Requests      BOTTLES DRAWN AEROBIC AND ANAEROBIC Blood Culture adequate volume Performed at [facility], [address] [place], [place] [address]    Culture      NO GROWTH < 24 HOURS Performed at [facility], [address], [place], [place] [address]    Report Status PENDING   Blood culture (routine x 2)     Status: None (Preliminary result)   Collection Time: 12/12/22  8:04 AM   Specimen: BLOOD  Result Value Ref Range   Specimen Description      BLOOD SITE NOT SPECIFIED Performed at [facility], [address], [place], [place] [address]    Special Requests      BOTTLES DRAWN AEROBIC AND ANAEROBIC Blood Culture adequate  volume Performed at [facility] [address], [place], [place] [address]    Culture      NO GROWTH < 24 HOURS Performed at [facility], [address], [place], [place] [address]    Report Status PENDING   I-Stat Lactic Acid, ED     Status: None   Collection Time: 12/12/22  8:13 AM  Result Value Ref Range   Lactic Acid, Venous 1.5 0.5 - 1.9 mmol/L  SARS Coronavirus 2 by RT PCR (hospital order, performed in [facility] hospital lab) *cepheid single result test* Anterior Nasal Swab     Status: None   Collection Time: 12/12/22  8:32 AM   Specimen: Anterior Nasal Swab  Result Value Ref Range   SARS Coronavirus 2 by RT PCR NEGATIVE NEGATIVE    Comment: (NOTE) SARS-CoV-2 target nucleic acids are NOT DETECTED.  The SARS-CoV-2 RNA is generally detectable in upper and lower respiratory specimens during the acute phase  of infection. The lowest concentration of SARS-CoV-2 viral copies this assay can detect is 250 copies / mL. A negative result does not preclude SARS-CoV-2 infection and should not be used as the sole basis for treatment or other patient management decisions.  A negative result may occur with improper specimen collection / handling, submission of specimen other than nasopharyngeal swab, presence of viral mutation(s) within the areas targeted by this assay, and inadequate number of viral copies (<250 copies / mL). A negative result must be combined with clinical observations, patient history, and epidemiological information.  Fact Sheet for Patients:   RoadLapTop.co.za  Fact Sheet for Healthcare Providers: http://kim-miller.com/  This test is not yet approved or  cleared by the [place] FDA and has been authorized for detection and/or diagnosis of SARS-CoV-2 by FDA under an Emergency Use Authorization (EUA).  This EUA will remain in effect (meaning this test can be used) for the duration of  the COVID-19 declaration under Section 564(b)(1) of the Act, 21 U.S.C. section 360bbb-3(b)(1), unless the authorization is terminated or revoked sooner.  Performed at [facility], [address], [place], [place] [address]   CBC     Status: Abnormal   Collection Time: 12/12/22 11:51 AM  Result Value Ref Range   WBC 16.0 (H) 4.0 - 10.5 K/uL   RBC 3.55 (L) 4.22 - 5.81 MIL/uL   Hemoglobin 10.4 (L) 13.0 - 17.0 g/dL   HCT 44.0 (L) 10.2 - 72.5 %   MCV 86.5 80.0 - 100.0 fL   MCH 29.3 26.0 - 34.0 pg   MCHC 33.9 30.0 - 36.0 g/dL   RDW 36.6 44.0 - 34.7 %   Platelets 80 (L) 150 - 400 K/uL    Comment: SPECIMEN CHECKED FOR CLOTS Immature Platelet Fraction may be clinically indicated, consider ordering this additional test [id number] REPEATED TO VERIFY PLATELET COUNT CONFIRMED BY SMEAR    nRBC 0.0 0.0 - 0.2 %    Comment: Performed at [facility], [address], [place], [place] [address]  Creatinine, serum     Status: None   Collection Time: 12/12/22 11:51 AM  Result Value Ref Range   Creatinine, Ser 0.86 0.61 - 1.24 mg/dL   GFR, Estimated >32 >95 mL/min    Comment: (NOTE) Calculated using the CKD-EPI Creatinine Equation (2021) Performed at [facility], [address] [place], [place] [address]   Magnesium     Status: None   Collection Time: 12/12/22 11:51 AM  Result Value Ref Range   Magnesium 1.7 1.7 - 2.4 mg/dL    Comment: Performed at [facility], [address] [place], [place] [address]  Hemoglobin A1c     Status: Abnormal   Collection Time: 12/12/22 11:51 AM  Result Value Ref Range   Hgb A1c MFr Bld 8.4 (H) 4.8 - 5.6 %    Comment: (NOTE) Pre diabetes:          5.7%-6.4%  Diabetes:              >6.4%  Glycemic control for   <7.0% adults with diabetes    Mean Plasma Glucose 194.38 mg/dL    Comment: Performed at [facility], [address] [place], [place] [address]  Osmolality      Status: None   Collection Time: 12/12/22  7:33 PM  Result Value Ref Range   Osmolality 289 275 - 295 mOsm/kg    Comment: REPEATED TO VERIFY Performed at [facility], [address], [place], [place] [address]   Protime-INR     Status: Abnormal   Collection Time: 12/13/22  6:08 AM  Result Value Ref Range   Prothrombin Time 16.9 (H) 11.4 - 15.2 seconds   INR 1.4 (H) 0.8 - 1.2    Comment: (NOTE) INR goal varies based on device and disease states. Performed at [facility], [address] [place], [place] [address]   Procalcitonin     Status: None   Collection Time: 12/13/22  6:08 AM  Result Value Ref Range   Procalcitonin 14.41 ng/mL    Comment:        Interpretation: PCT >= 10 ng/mL: Important systemic inflammatory response, almost exclusively due to severe bacterial sepsis or septic shock. (NOTE)       Sepsis PCT Algorithm           Lower Respiratory Tract                                      Infection PCT Algorithm    ----------------------------     ----------------------------         PCT < 0.25 ng/mL                PCT < 0.10 ng/mL          Strongly encourage             Strongly discourage   discontinuation of antibiotics    initiation of antibiotics    ----------------------------     -----------------------------       PCT 0.25 - 0.50 ng/mL            PCT 0.10 - 0.25 ng/mL               OR       >80% decrease in PCT            Discourage initiation of                                            antibiotics      Encourage discontinuation           of antibiotics    ----------------------------     -----------------------------         PCT >= 0.50 ng/mL              PCT 0.26 - 0.50 ng/mL                AND       <80% decrease in PCT             Encourage initiation of                                             antibiotics       Encourage continuation           of antibiotics    ----------------------------      -----------------------------        PCT >= 0.50 ng/mL                  PCT > 0.50 ng/mL  AND         increase in PCT                  Strongly encourage                                      initiation of antibiotics    Strongly encourage escalation           of antibiotics                                     -----------------------------                                           PCT <= 0.25 ng/mL                                                 OR                                        > 80% decrease in PCT                                      Discontinue / Do not initiate                                             antibiotics  Performed at [facility], [address], [place], [place] [address]   Comprehensive metabolic panel     Status: Abnormal   Collection Time: 12/13/22  6:08 AM  Result Value Ref Range   Sodium 135 135 - 145 mmol/L    Comment: DELTA CHECK NOTED   Potassium 4.1 3.5 - 5.1 mmol/L   Chloride 102 98 - 111 mmol/L   CO2 23 22 - 32 mmol/L   Glucose, Bld 161 (H) 70 - 99 mg/dL    Comment: Glucose reference range applies only to samples taken after fasting for at least 8 hours.   BUN 17 6 - 20 mg/dL   Creatinine, Ser 1.19 0.61 - 1.24 mg/dL   Calcium 7.8 (L) 8.9 - 10.3 mg/dL   Total Protein 6.6 6.5 - 8.1 g/dL   Albumin 2.4 (L) 3.5 - 5.0 g/dL   AST 46 (H) 15 - 41 U/L   ALT 48 (H) 0 - 44 U/L   Alkaline Phosphatase 308 (H) 38 - 126 U/L   Total Bilirubin 2.0 (H) 0.3 - 1.2 mg/dL   GFR, Estimated >14 >78 mL/min    Comment: (NOTE) Calculated using the CKD-EPI Creatinine Equation (2021)    Anion gap 10 5 - 15    Comment: Performed at [facility], [address], [place], [place] [address]  CBC     Status: Abnormal   Collection Time: 12/13/22  6:08  AM  Result Value Ref Range   WBC 14.4 (H) 4.0 - 10.5 K/uL   RBC 4.19 (L) 4.22 - 5.81 MIL/uL   Hemoglobin 12.2 (L) 13.0 - 17.0 g/dL   HCT 16.1 (L) 09.6 - 04.5 %   MCV  87.6 80.0 - 100.0 fL   MCH 29.1 26.0 - 34.0 pg   MCHC 33.2 30.0 - 36.0 g/dL   RDW 40.9 81.1 - 91.4 %   Platelets 68 (L) 150 - 400 K/uL    Comment: Immature Platelet Fraction may be clinically indicated, consider ordering this additional test [id number] CONSISTENT WITH PREVIOUS RESULT    nRBC 0.0 0.0 - 0.2 %    Comment: Performed at [facility], [address], [place], [place] [address]  Glucose, capillary     Status: Abnormal   Collection Time: 12/13/22  9:02 AM  Result Value Ref Range   Glucose-Capillary 224 (H) 70 - 99 mg/dL    Comment: Glucose reference range applies only to samples taken after fasting for at least 8 hours.   CT ABDOMEN PELVIS W CONTRAST  Result Date: 12/12/2022 CLINICAL DATA:  History of mass-forming chronic pancreatitis with three day history of right upper abdominal pain associated with fever EXAM: CT ABDOMEN AND PELVIS WITH CONTRAST TECHNIQUE: Multidetector CT imaging of the abdomen and pelvis was performed using the standard protocol following bolus administration of intravenous contrast. RADIATION DOSE REDUCTION: This exam was performed according to the departmental dose-optimization program which includes automated exposure control, adjustment of the mA and/or kV according to patient size and/or use of iterative reconstruction technique. CONTRAST:  OMNIPAQUE IOHEXOL 300 MG/ML  SOLN COMPARISON:  MR abdomen dated 10/15/2022, CT abdomen and pelvis dated 09/19/2022, CTA abdomen and pelvis dated 05/10/2022 FINDINGS: Lower chest: No focal consolidation or pulmonary nodule in the lung bases. No pleural effusion or pneumothorax demonstrated. Partially imaged heart size is normal. Hepatobiliary: Interval development of multiloculated cystic focus centered within segment 7 measuring 4.5 x 3.4 x 7.6 cm (2:15, 7:88) With surrounding parenchymal hyperenhancement. Persistent intra hepatic bile duct dilation to the level of the portal confluence, where long  segment narrowing of the common bile duct is again seen. Normal gallbladder. Pancreas: Sequela of chronic pancreatitis with diffuse parenchymal atrophy and bulky stone in the region of the pancreatic head. Spleen: Similar enlargement, 17.5 cm in the AP dimension. Adrenals/Urinary Tract: No adrenal nodules. No suspicious renal mass, calculi or hydronephrosis. Bilateral simple cysts. No specific follow-up imaging recommended. No focal bladder wall thickening. Stomach/Bowel: Normal appearance of the stomach. No evidence of bowel wall thickening, distention, or inflammatory changes. Appendix is not discretely seen. 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 Aortic atherosclerosis. No enlarged abdominal or pelvic lymph nodes. Reproductive: Prostate is unremarkable. Other: No free fluid, fluid collection, or free air. Musculoskeletal: No acute or abnormal lytic or blastic osseous lesions. IMPRESSION: 1. Interval development of multiloculated cystic focus centered within segment 7 measuring 4.5 x 3.4 x 7.6 cm with surrounding parenchymal hyperenhancement, suspicious for hepatic abscess. 2. Persistent intra hepatic bile duct dilation to the level of the portal confluence, where long segment narrowing of the common bile duct is again seen. 3. Sequela of chronic pancreatitis with diffuse parenchymal atrophy and bulky stone in the region of the pancreatic head. 4. Similar splenomegaly. 5. 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 6.  Aortic Atherosclerosis (ICD10-I70.0). Electronically Signed  By: Agustin Cree M.D.   On: 12/12/2022 10:39   US Abdomen Complete  Result Date: 12/12/2022 CLINICAL DATA:  Upper abdominal pain EXAM: ABDOMEN ULTRASOUND COMPLETE COMPARISON:  CT abdomen and pelvis 08/19/2016 FINDINGS: Gallbladder: No gallstones or wall thickening visualized. No sonographic  Murphy sign noted by sonographer. Common bile duct: Diameter: 3.7 mm Liver: No focal lesion identified. Within normal limits in parenchymal echogenicity. Portal vein is patent on color Doppler imaging with normal direction of blood flow towards the liver. IVC: No abnormality visualized. Pancreas: Visualized portion unremarkable. Spleen: Size and appearance within normal limits. Right Kidney: Length: 9.6 cm. Echogenicity within normal limits. No mass or hydronephrosis visualized. Left Kidney: Length: 10.4 cm. Echogenicity within normal limits. No mass or hydronephrosis visualized. There is a 2.5 cm simple cyst left kidney. No imaging follow-up needed. Abdominal aorta: No aneurysm visualized. Other findings: None. IMPRESSION: No cholelithiasis or sonographic evidence for acute cholecystitis. Electronically Signed   By: Annia Belt M.D.   On: 12/12/2022 08:06    Anti-infectives (From admission, onward)    Start     Dose/Rate Route Frequency Ordered Stop   12/12/22 1600  piperacillin-tazobactam (ZOSYN) IVPB 3.375 g        3.375 g 12.5 mL/hr over 240 Minutes Intravenous Every 8 hours 12/12/22 1304     12/12/22 0745  piperacillin-tazobactam (ZOSYN) IVPB 3.375 g        3.375 g 100 mL/hr over 30 Minutes Intravenous  Once 12/12/22 0737 12/12/22 1154      Per outside GI notes had a "EUS 02/05/22: EUS-FNA of small hypoechoic area measuring ~15-20 mm in size in the HOP proximal to a large stone- unclear if represents a mass, collection, or mass-forming chronic pancreatitis. FNA - Rare epithelial cells. Inflammation." "CA 19-9 elevated 354 (09/23/22)"  AFP pending here  Assessment/Plan Multiloculated hepatic abscess - Patient with complex history listed above who was admitted yesterday after presenting with RUQ pain and fever and a CT scan showing a recurrent 4.5 x 3.4 x 7.6 cm multiloculated suspected hepatic abscess. Given his significant cirrhosis he is a very poor surgical candidate. Recommend more  conservative measures with IV antibiotics and IR consult for possible drainage. We will follow with you  FEN - IVF, NPO VTE - lovenox per primary (may want to hold this given his thrombocytopenia) ID - zosyn Foley - none  I reviewed nursing notes, Consultant (GI) notes, hospitalist notes, last 24 h vitals and pain scores, last 48 h intake and output, last 24 h labs and trends, and last 24 h imaging results.   Leary Roca, [facility] 12/13/2022, 11:22 AM Please see Amion for pager number during day hours 7:00am-4:30pm

## 2022-12-13 NOTE — Progress Notes (Cosign Needed Addendum)
Referring Physician(s): Hung,P  Supervising Physician: Marliss Coots  Patient Status:  [facility] - In-pt  Chief Complaint: Abdominal pain, recent fevers, ? hepatic abscess   Subjective: Patient known to IR team from bone marrow biopsy in 2017. 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  CT abdomen pelvis performed yesterday revealed:  1. Interval development of multiloculated cystic focus centered within segment 7 measuring 4.5 x 3.4 x 7.6 cm with surrounding parenchymal hyperenhancement, suspicious for hepatic abscess. 2. Persistent intra hepatic bile duct dilation to the level of the portal confluence, where long segment narrowing of the common bile duct is again seen. 3. Sequela of chronic pancreatitis with diffuse parenchymal atrophy and bulky stone in the region of the pancreatic head. 4. Similar splenomegaly. 5. 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 6.  Aortic Atherosclerosis  Additional past medical history significant for fatty liver, hypertension, thrombocytopenia; latest temp 99, BP soft at 98/61, heart rate 111.  WBC 14.4, hemoglobin 12.2, platelets 68k; PT 16.9, INR 1.4, blood culture pending; patient on IV Zosyn.  Request now received from GI team for image guided aspiration versus drainage of hepatic cyst versus abscess; patient currently without headache, chest pain, dyspnea, cough, nausea, vomiting or bleeding. Pt also reports father had hx liver cancer.    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 pseudocyst in 08/2013. Follwed by Dr Rob Bunting and Rise Mu (at [facility]).     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    Hypertension    No meds yet.   Liver abscess 01/2014   Pneumonia    10-30-14 [facility] hospital admission, no problems now.   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    Thrombocytopenia (HCC) 2015   with splenomegaly 03/2014.  s/p bone marrow biopsy, nonspecific findings 09/2015   Past Surgical History:  Procedure Laterality Date   APPENDECTOMY     BIOPSY  05/11/2022   Procedure: BIOPSY;  Surgeon: Jenel Lucks, MD;  Location: [facility] ENDOSCOPY;  Service: Gastroenterology;;   COLONOSCOPY WITH PROPOFOL N/A 01/05/2015   Rachael Fee, MD;  Thre 7 to 13 mm tubular adenomatous polyps removed. repeat colonoscopy suggested in 12/2017.     ENDOSCOPIC RETROGRADE CHOLANGIOPANCREATOGRAPHY (ERCP) WITH PROPOFOL N/A 06/02/2014   Dr Rob Bunting.  removed covered metal stent.  no stricture noted.  Rec: biliary diversion surgery if recurrent stricture arises.   ERCP N/A 02/22/2014   Meryl Dare, MD; with removal of plastic stent, replaced with metal biliary stent.  filling defect: stone, sludge, blood clots removed with baloon sweep.     ERCP N/A 02/18/2014   For suspected cholangitis. Louis Meckel, MD; s/p sphincterotomy, blood clots and sludge swept out, remnant of pancreatic duct noted (removal not mentioned.  plastic stent placed  into CBD.     ERCP, plastic pancreatic duct stent placement.  2007   Dr Danny Lawless at [facility].    ESOPHAGEAL BANDING  05/11/2022   Procedure: ESOPHAGEAL BANDING;  Surgeon: Jenel Lucks, MD;  Location: [facility] ENDOSCOPY;  Service: Gastroenterology;;    ESOPHAGOGASTRODUODENOSCOPY N/A 05/11/2022   Procedure: ESOPHAGOGASTRODUODENOSCOPY (EGD);  Surgeon: Jenel Lucks, MD;  Location: [facility] ENDOSCOPY;  Service: Gastroenterology;  Laterality: N/A;   ESOPHAGOGASTRODUODENOSCOPY (EGD) WITH PROPOFOL N/A 09/14/2013   Dr Rhea Belton.  erosive gastritis.  bulbar ulcer.  removal of plastic biliaty stent.     ESOPHAGOGASTRODUODENOSCOPY (EGD) WITH PROPOFOL N/A 01/05/2015   for melena. Rachael Fee.  Large distal esophageal varices, no bleeding stigmata.  no gastric varices, not banded.  Moderate to severe portal hypertensive gastropathy.  Nadolol initiated.    ESOPHAGOGASTRODUODENOSCOPY (EGD) WITH PROPOFOL N/A 08/19/2016   Armbruster, Reeves Forth, MD; Large esophageal varices with red whale signs, high risk for bleeding. 11 bands placed.  Portal hypertensive gastropathy.    FLEXIBLE SIGMOIDOSCOPY N/A 05/11/2022   Procedure: FLEXIBLE SIGMOIDOSCOPY;  Surgeon: Jenel Lucks, MD;  Location: [facility] ENDOSCOPY;  Service: Gastroenterology;  Laterality: N/A;   GASTROINTESTINAL STENT REMOVAL N/A 09/14/2013   Beverley Fiedler, MD; Pancreatic stent removal       Allergies: Patient has no known allergies.  Medications: Prior to Admission medications   Medication Sig Start Date End Date Taking? Authorizing Provider  FLUoxetine (PROZAC) 10 MG tablet Take 10 mg by mouth daily. 12/07/22  Yes [provider]  metFORMIN (GLUCOPHAGE) 1000 MG tablet Take 1 tablet by mouth daily with breakfast. 10/07/22  Yes [provider]  methocarbamol (ROBAXIN) 500 MG tablet Take 500 mg by mouth 3 (three) times daily. 09/23/22  Yes [provider]  spironolactone (ALDACTONE) 50 MG tablet Take 50 mg by mouth daily. 11/06/22  Yes [provider]  SYMBICORT 160-4.5 MCG/ACT inhaler Inhale 2 puffs into the lungs 2 (two) times daily. 11/11/22  Yes [provider]  triamcinolone cream (KENALOG) 0.5 % Apply topically. 08/29/22  Yes [provider]   dicyclomine (BENTYL) 20 MG tablet Take 20 mg by mouth in the morning and at bedtime.    [provider]  HYDROcodone-acetaminophen (NORCO) 7.5-325 MG tablet Take 1 tablet by mouth every 6 (six) hours as needed for moderate pain.    [provider]  nicotine (NICODERM CQ - DOSED IN MG/24 HOURS) 21 mg/24hr patch Place 1 patch (21 mg total) onto the skin daily. 05/12/22   Almon Hercules, MD  pantoprazole (PROTONIX) 40 MG tablet Take 1 tablet (40 mg total) by mouth 2 (two) times daily for 30 days, THEN 1 tablet (40 mg total) daily. 05/12/22 12/12/22  Almon Hercules, MD  polyethylene glycol powder (MIRALAX) 17 GM/SCOOP powder Take 17 g by mouth 2 (two) times daily as needed for moderate constipation or mild constipation. 05/12/22   Almon Hercules, MD  propranolol (INDERAL) 20 MG tablet Take 1 tablet (20 mg total) by mouth 2 (two) times daily. 05/12/22 12/12/22  Almon Hercules, MD     Vital Signs: BP 98/61 (BP Location: Right Arm)   Pulse (!) 111   Temp 99 F (37.2 C) (Oral)   Resp 20   Ht 5\' 8"  (1.727 m)   Wt 120 lb (54.4 kg)   SpO2 96%   BMI 18.25 kg/m   Physical Exam awake, alert.  Chest clear to auscultation bilaterally.  Heart with tachycardic but regular rhythm.  Abdomen soft, positive bowel sounds, currently nontender.  No lower extremity edema.  Imaging: CT ABDOMEN PELVIS W CONTRAST  Result Date: 12/12/2022 CLINICAL DATA:  History of mass-forming chronic pancreatitis with three day history of right upper abdominal pain associated with fever EXAM: CT ABDOMEN AND PELVIS WITH CONTRAST TECHNIQUE: Multidetector CT imaging of the abdomen and pelvis was performed using the standard protocol following bolus administration of intravenous contrast. RADIATION DOSE REDUCTION: This exam was performed according to the departmental dose-optimization program which includes automated exposure control, adjustment of the mA and/or kV according to patient size and/or use of iterative reconstruction  technique. CONTRAST:  OMNIPAQUE IOHEXOL 300 MG/ML  SOLN COMPARISON:  MR abdomen dated 10/15/2022, CT abdomen and pelvis dated 09/19/2022, CTA abdomen and pelvis dated 05/10/2022 FINDINGS: Lower chest: No focal consolidation or pulmonary nodule in the lung bases. No pleural effusion or pneumothorax demonstrated. Partially imaged heart size is normal. Hepatobiliary: Interval development of multiloculated cystic focus centered within segment 7 measuring 4.5 x 3.4 x 7.6 cm (2:15, 7:88) With surrounding parenchymal hyperenhancement. Persistent intra hepatic bile duct dilation to the level of the portal confluence, where long segment narrowing of the common bile duct is again seen. Normal gallbladder. Pancreas: Sequela of chronic pancreatitis with diffuse parenchymal atrophy and bulky stone in the region of the pancreatic head. Spleen: Similar enlargement, 17.5 cm in the AP dimension. Adrenals/Urinary Tract: No adrenal nodules. No suspicious renal mass, calculi or hydronephrosis. Bilateral simple cysts. No specific follow-up imaging recommended. No focal bladder wall thickening. Stomach/Bowel: Normal appearance of the stomach. No evidence of bowel wall thickening, distention, or inflammatory changes. Appendix is not discretely seen. 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 Aortic atherosclerosis. No enlarged abdominal or pelvic lymph nodes. Reproductive: Prostate is unremarkable. Other: No free fluid, fluid collection, or free air. Musculoskeletal: No acute or abnormal lytic or blastic osseous lesions. IMPRESSION: 1. Interval development of multiloculated cystic focus centered within segment 7 measuring 4.5 x 3.4 x 7.6 cm with surrounding parenchymal hyperenhancement, suspicious for hepatic abscess. 2. Persistent intra hepatic bile duct dilation to the level of the portal confluence, where long segment narrowing of the common  bile duct is again seen. 3. Sequela of chronic pancreatitis with diffuse parenchymal atrophy and bulky stone in the region of the pancreatic head. 4. Similar splenomegaly. 5. 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 6.  Aortic Atherosclerosis (ICD10-I70.0). Electronically Signed   By: Agustin Cree M.D.   On: 12/12/2022 10:39   US Abdomen Complete  Result Date: 12/12/2022 CLINICAL DATA:  Upper abdominal pain EXAM: ABDOMEN ULTRASOUND COMPLETE COMPARISON:  CT abdomen and pelvis 08/19/2016 FINDINGS: Gallbladder: No gallstones or wall thickening visualized. No sonographic Murphy sign noted by sonographer. Common bile duct: Diameter: 3.7 mm Liver: No focal lesion identified. Within normal limits in parenchymal echogenicity. Portal vein is patent on color Doppler imaging with normal direction of blood flow towards the liver. IVC: No abnormality visualized. Pancreas: Visualized portion unremarkable. Spleen: Size and appearance within normal limits. Right Kidney: Length: 9.6 cm. Echogenicity within normal limits. No mass or hydronephrosis visualized. Left Kidney: Length: 10.4 cm. Echogenicity within normal limits. No mass or hydronephrosis visualized. There is a 2.5 cm simple cyst left kidney. No imaging follow-up needed. Abdominal aorta: No aneurysm visualized. Other findings: None. IMPRESSION: No cholelithiasis or sonographic evidence for acute cholecystitis. Electronically Signed   By: Francis Gaines.D.  On: 12/12/2022 08:06    Labs:  CBC: Recent Labs    05/13/22 0442 12/12/22 0024 12/12/22 1151 12/13/22 0608  WBC 2.0* 16.1* 16.0* 14.4*  HGB 9.6* 11.7* 10.4* 12.2*  HCT 27.5* 34.3* 30.7* 36.7*  PLT 37* 105* 80* 68*    COAGS: Recent Labs    05/10/22 0357 05/12/22 0428 05/13/22 0442 12/13/22 0608  INR 1.2 1.2 1.3* 1.4*  APTT 28  --  41*  --     BMP: Recent Labs    05/12/22 0428 05/13/22 0442 12/12/22 0024 12/12/22 1151 12/13/22 0608   NA 138 139 125*  --  135  K 3.7 3.6 4.1  --  4.1  CL 107 105 92*  --  102  CO2 23 24 24   --  23  GLUCOSE 152* 107* 225*  --  161*  BUN 8 7 20   --  17  CALCIUM 7.8* 8.0* 8.5*  --  7.8*  CREATININE 0.91 0.95 1.02 0.86 0.86  GFRNONAA >60 >60 >60 >60 >60    LIVER FUNCTION TESTS: Recent Labs    05/12/22 0428 05/13/22 0442 12/12/22 0024 12/13/22 0608  BILITOT 0.6 0.8 2.5* 2.0*  AST 14* 19 20 46*  ALT 19 19 34 48*  ALKPHOS 61 67 407* 308*  PROT 4.9* 5.1* 7.6 6.6  ALBUMIN 2.7* 2.7* 3.0* 2.4*    Assessment and Plan: 52 year old male with a complex history of cholangitis, choledocholithiasis, biliary stricture with stent placement, chronic pancreatitis 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, 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   CT abdomen pelvis performed yesterday revealed:  1. Interval development of multiloculated cystic focus centered within segment 7 measuring 4.5 x 3.4 x 7.6 cm with surrounding parenchymal hyperenhancement, suspicious for hepatic abscess. 2. Persistent intra hepatic bile duct dilation to the level of the portal confluence, where long segment narrowing of the common bile duct is again seen. 3. Sequela of chronic pancreatitis with diffuse parenchymal atrophy and bulky stone in the region of the pancreatic head. 4. Similar splenomegaly. 5. 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 6.  Aortic Atherosclerosis  Additional past medical history significant for fatty liver, hypertension, thrombocytopenia; latest temp 99, BP soft at 98/61, heart rate 111.  WBC 14.4, hemoglobin 12.2, platelets 68k; PT 16.9, INR 1.4, blood culture pending; patient on IV  Zosyn.  Request now received from GI team for image guided aspiration versus drainage of hepatic cyst versus abscess; imaging studies were reviewed by Dr. Elby Showers. Risks and benefits discussed with the patient including bleeding, infection, damage to adjacent structures, need for long term drainage and sepsis.  All of the patient's questions were answered, patient is agreeable to proceed. Consent signed and in chart.  Procedure scheduled for today.    Electronically Signed: D. Jeananne [name], PA-C 12/13/2022, 9:05 AM   I spent a total of 25 minutes at the the patient's bedside AND on the patient's hospital floor or unit, greater than 50% of which was counseling/coordinating care for CT-guided aspiration versus drainage of hepatic cyst/?  abscess   Patient ID: Lawrence Brock, male   DOB: February 26, 1971, 52 y.o.   MRN: [id number]

## 2022-12-14 ENCOUNTER — Inpatient Hospital Stay (HOSPITAL_COMMUNITY): Payer: BLUE CROSS/BLUE SHIELD

## 2022-12-14 DIAGNOSIS — K750 Abscess of liver: Secondary | ICD-10-CM

## 2022-12-14 LAB — GLUCOSE, CAPILLARY
Glucose-Capillary: 168 mg/dL — ABNORMAL HIGH (ref 70–99)
Glucose-Capillary: 126 mg/dL — ABNORMAL HIGH (ref 70–99)
Glucose-Capillary: 115 mg/dL — ABNORMAL HIGH (ref 70–99)
Glucose-Capillary: 218 mg/dL — ABNORMAL HIGH (ref 70–99)

## 2022-12-14 LAB — AFP TUMOR MARKER: AFP, Serum, Tumor Marker: <1.8 ng/mL (ref 0.0–8.4)

## 2022-12-14 LAB — MRSA NEXT GEN BY PCR, NASAL: MRSA by PCR Next Gen: NOT DETECTED

## 2022-12-14 MED ORDER — SODIUM CHLORIDE 0.9% FLUSH
5.0000 mL | Freq: Three times a day (TID) | INTRAVENOUS | Status: DC
  Administered 2022-12-14 – 2022-12-21 (×22): 5 mL

## 2022-12-14 MED ORDER — NALOXONE HCL 0.4 MG/ML IJ SOLN
INTRAMUSCULAR | Status: AC
  Filled 2022-12-14: qty 1

## 2022-12-14 MED ORDER — MIDAZOLAM HCL 2 MG/2ML IJ SOLN
INTRAMUSCULAR | Status: AC
  Filled 2022-12-14: qty 2

## 2022-12-14 MED ORDER — SODIUM CHLORIDE 0.9 % IV BOLUS
1000.0000 mL | Freq: Once | INTRAVENOUS | Status: AC
  Administered 2022-12-14: 1000 mL via INTRAVENOUS

## 2022-12-14 MED ORDER — ORAL CARE MOUTH RINSE: 15.0000 mL | OROMUCOSAL | Status: DC | PRN

## 2022-12-14 MED ORDER — CHLORHEXIDINE GLUCONATE CLOTH 2 % EX PADS
6.0000 | MEDICATED_PAD | Freq: Every day | CUTANEOUS | Status: DC
  Administered 2022-12-14 – 2022-12-16 (×3): 6 via TOPICAL

## 2022-12-14 MED ORDER — FENTANYL CITRATE (PF) 100 MCG/2ML IJ SOLN
INTRAMUSCULAR | Status: AC | PRN
Start: 2022-12-14 — End: 2022-12-14
  Administered 2022-12-14: 25 ug via INTRAVENOUS

## 2022-12-14 MED ORDER — LACTATED RINGERS IV BOLUS: 1000.0000 mL | Freq: Once | INTRAVENOUS | Status: DC

## 2022-12-14 MED ORDER — MIDAZOLAM HCL 2 MG/2ML IJ SOLN
INTRAMUSCULAR | Status: AC | PRN
Start: 2022-12-14 — End: 2022-12-14
  Administered 2022-12-14: 1 mg via INTRAVENOUS

## 2022-12-14 MED ORDER — FENTANYL CITRATE (PF) 100 MCG/2ML IJ SOLN
INTRAMUSCULAR | Status: AC
  Filled 2022-12-14: qty 2

## 2022-12-14 MED ORDER — MIDAZOLAM HCL 2 MG/2ML IJ SOLN
INTRAMUSCULAR | Status: AC | PRN
Start: 2022-12-14 — End: 2022-12-14
  Administered 2022-12-14: .5 mg via INTRAVENOUS

## 2022-12-14 NOTE — Plan of Care (Signed)

## 2022-12-14 NOTE — Progress Notes (Addendum)
PROGRESS NOTE    Lawrence Brock  WUJ:[id number]  DOB: 11/11/1970  DOA: 12/11/2022 PCP: Verlon Au, MD Outpatient Specialists:   Hospital course:  Emigdio Duty is a 52 y.o. 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  Workup revealed multifocal hepatic abscess.  Patient was seen by gastroenterology and general surgery who recommended aspiration via IR.  Subjective:  Patient was seen before procedure and said he was doing okay, had discomfort in his abdomen, notes "it does not hurt if you press on it just hurts in general".   Objective: Vitals:   12/14/22 1020 12/14/22 1101 12/14/22 1202 12/14/22 1258  BP: 120/83 (!) 112/92 106/68 96/72  Pulse: 93 (!) 143 (!) 134 (!) 118  Resp:   18   Temp:  (!) 100.7 F (38.2 C) (!) 100.5 F (38.1 C) 99.6 F (37.6 C)  TempSrc:  Oral Oral Oral  SpO2: 99% 92% 93% 96%  Weight:      Height:        Intake/Output Summary (Last 24 hours) at 12/14/2022 1340 Last data filed at 12/14/2022 1100 Gross per 24 hour  Intake 1154.83 ml  Output 900 ml  Net 254.83 ml   Filed Weights   12/12/22 0010  Weight: 54.4 kg     Exam:  General: Pale chronically ill-appearing gentleman looking much older than stated age lying in bed in NAD Eyes: sclera anicteric, conjuctiva mild injection bilaterally CVS: S1-S2, regular  Respiratory:  decreased air entry bilaterally secondary to decreased inspiratory effort, rales at bases  GI: NABS, soft, no real tenderness to light palpation throughout his abdomen, deep palpation was not attempted LE: Warm and well-perfused Neuro: A/O x 3,  grossly nonfocal.  Psych: patient is logical and coherent, judgement and insight appear normal, mood and affect appropriate to situation.  Data  Reviewed:  Basic Metabolic Panel: Recent Labs  Lab 12/12/22 0024 12/12/22 1151 12/13/22 0608  NA 125*  --  135  K 4.1  --  4.1  CL 92*  --  102  CO2 24  --  23  GLUCOSE 225*  --  161*  BUN 20  --  17  CREATININE 1.02 0.86 0.86  CALCIUM 8.5*  --  7.8*  MG  --  1.7  --     CBC: Recent Labs  Lab 12/12/22 0024 12/12/22 1151 12/13/22 0608  WBC 16.1* 16.0* 14.4*  NEUTROABS 14.7*  --   --   HGB 11.7* 10.4* 12.2*  HCT 34.3* 30.7* 36.7*  MCV 86.0 86.5 87.6  PLT 105* 80* 68*     Scheduled Meds:  enoxaparin (LOVENOX) injection  40 mg Subcutaneous Daily   insulin aspart  0-15 Units Subcutaneous TID WC   insulin aspart  0-5 Units Subcutaneous QHS   nicotine  14 mg Transdermal Daily   pantoprazole (PROTONIX) IV  40 mg Intravenous Q24H   sodium chloride flush  5 mL Intracatheter Q8H   Continuous Infusions:  sodium chloride Stopped (12/12/22 1711)   lactated ringers     piperacillin-tazobactam (ZOSYN)  IV 3.375 g (12/14/22 0824)     Assessment & Plan:   Multifocal hepatic abscess--POA Ongoing sepsis physiology--POA Patient underwent liver abscess aspiration by IR Immediately after aspiration, patient was febrile, tachycardic and felt unwell Patient was treated with Tylenol and IV fluids Patient remains  on Zosyn pending culture results Given persistent tachycardia, will treat with 1 L bolus of NS however given cirrhosis, patient may benefit from albumin if not improved with crystalloid bolus.  -- Patient with decreasing blood pressure despite crystalloid bolus.  Discussed with Dr. Corliss Blacker, will provide 1 more liter of crystalloid and if BP is still low, will add albumin.  Patient has some ascites--abdomen is soft, not tense or firm.  He has no lower extremity edema.  Will transfer patient to stepdown and Dr. Ortencia Kick will evaluate to see whether he will need peripheral pressors or transfer to ICU.  New onset DM 2 Sugars are improving on present SSI Will need to add  long-acting insulin when patient is clinically more stable  Chronic pancreatitis Continue present management     DVT prophylaxis: SCD Code Status: Full Family Communication: None today     Studies: No results found.  Principal Problem:   Sepsis (HCC) Active Problems:   Thrombocytopenia, unspecified (HCC)   Tobacco abuse   Hepatic abscess   Common biliary duct stricture   Pancytopenia (HCC)   Portal vein thrombosis   Splenomegaly     Arael Piccione Orma Flaming, [facility]  If 7PM-7AM, please contact night-coverage [email or web address]   LOS: 2 days

## 2022-12-14 NOTE — Progress Notes (Signed)
   12/14/22 1101  Assess: MEWS Score  Temp (!) 100.7 F (38.2 C)  BP (!) 112/92  MAP (mmHg) 100  Pulse Rate (!) 143  SpO2 92 %  Assess: MEWS Score  MEWS Temp 1  MEWS Systolic 0  MEWS Pulse 3  MEWS RR 2  MEWS LOC 0  MEWS Score 6  MEWS Score Color Red  Assess: if the MEWS score is Yellow or Red  Were vital signs accurate and taken at a resting state? Yes  Does the patient meet 2 or more of the SIRS criteria? Yes  Does the patient have a confirmed or suspected source of infection? Yes  MEWS guidelines implemented  Yes, red  Treat  MEWS Interventions Considered administering scheduled or prn medications/treatments as ordered  Take Vital Signs  Increase Vital Sign Frequency  Red: Q1hr x2, continue Q4hrs until patient remains green for 12hrs  Escalate  MEWS: Escalate Red: Discuss with charge nurse and notify provider. Consider notifying RRT. If remains red for 2 hours consider need for higher level of care  Notify: Charge Nurse/RN  Name of Charge Nurse/RN Notified Jilda Panda RN  Provider Notification  Provider Name/Title Dr. Luberta Robertson  Date Provider Notified 12/14/22  Time Provider Notified 1111  Method of Notification Page  Notification Reason Change in status  Provider response No new orders  Date of Provider Response 12/14/22  Time of Provider Response 1111  Notify: Rapid Response  Name of Rapid Response RN Notified Zoe RN  Date Rapid Response Notified 12/14/22  Time Rapid Response Notified 1111  Assess: SIRS CRITERIA  SIRS Temperature  0  SIRS Pulse 1  SIRS Respirations  1  SIRS WBC 0  SIRS Score Sum  2

## 2022-12-14 NOTE — Plan of Care (Signed)
Problem: Fluid Volume: Goal: Hemodynamic stability will improve Outcome: Progressing   Problem: Clinical Measurements: Goal: Signs and symptoms of infection will decrease Outcome: Progressing   Problem: Respiratory: Goal: Ability to maintain adequate ventilation will improve Outcome: Progressing

## 2022-12-14 NOTE — Plan of Care (Signed)

## 2022-12-14 NOTE — Progress Notes (Addendum)
Subjective/Chief Complaint: Comfortable this morning with minimal pain   Objective: Vital signs in last 24 hours: Temp:  [98.2 F (36.8 C)-101 F (38.3 C)] 99.5 F (37.5 C) (09/21 0649) Pulse Rate:  [91-120] 110 (09/21 0505) Resp:  [14-20] 18 (09/21 0505) BP: (80-115)/(55-79) 114/78 (09/21 0505) SpO2:  [88 %-100 %] 95 % (09/21 0505) Last BM Date : 12/12/22  Intake/Output from previous day: 09/20 0701 - 09/21 0700 In: 1154.8 [I.V.:976.7; IV Piggyback:178.2] Out: 600 [Urine:600] Intake/Output this shift: No intake/output data recorded.  Exam: Awake and alert Abdomen soft but full, minimally tender No peritoneal signs  Lab Results:  Recent Labs    12/12/22 1151 12/13/22 0608  WBC 16.0* 14.4*  HGB 10.4* 12.2*  HCT 30.7* 36.7*  PLT 80* 68*   BMET Recent Labs    12/12/22 0024 12/12/22 1151 12/13/22 0608  NA 125*  --  135  K 4.1  --  4.1  CL 92*  --  102  CO2 24  --  23  GLUCOSE 225*  --  161*  BUN 20  --  17  CREATININE 1.02 0.86 0.86  CALCIUM 8.5*  --  7.8*   PT/INR Recent Labs    12/13/22 0608  LABPROT 16.9*  INR 1.4*   ABG No results for input(s): "PHART", "HCO3" in the last 72 hours.  Invalid input(s): "PCO2", "PO2"  Studies/Results: CT ABDOMEN PELVIS W CONTRAST  Result Date: 12/12/2022 CLINICAL DATA:  History of mass-forming chronic pancreatitis with three day history of right upper abdominal pain associated with fever EXAM: CT ABDOMEN AND PELVIS WITH CONTRAST TECHNIQUE: Multidetector CT imaging of the abdomen and pelvis was performed using the standard protocol following bolus administration of intravenous contrast. RADIATION DOSE REDUCTION: This exam was performed according to the departmental dose-optimization program which includes automated exposure control, adjustment of the mA and/or kV according to patient size and/or use of iterative reconstruction technique. CONTRAST:  OMNIPAQUE IOHEXOL 300 MG/ML  SOLN COMPARISON:  MR abdomen  dated 10/15/2022, CT abdomen and pelvis dated 09/19/2022, CTA abdomen and pelvis dated 05/10/2022 FINDINGS: Lower chest: No focal consolidation or pulmonary nodule in the lung bases. No pleural effusion or pneumothorax demonstrated. Partially imaged heart size is normal. Hepatobiliary: Interval development of multiloculated cystic focus centered within segment 7 measuring 4.5 x 3.4 x 7.6 cm (2:15, 7:88) With surrounding parenchymal hyperenhancement. Persistent intra hepatic bile duct dilation to the level of the portal confluence, where long segment narrowing of the common bile duct is again seen. Normal gallbladder. Pancreas: Sequela of chronic pancreatitis with diffuse parenchymal atrophy and bulky stone in the region of the pancreatic head. Spleen: Similar enlargement, 17.5 cm in the AP dimension. Adrenals/Urinary Tract: No adrenal nodules. No suspicious renal mass, calculi or hydronephrosis. Bilateral simple cysts. No specific follow-up imaging recommended. No focal bladder wall thickening. Stomach/Bowel: Normal appearance of the stomach. No evidence of bowel wall thickening, distention, or inflammatory changes. Appendix is not discretely seen. 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 Aortic atherosclerosis. No enlarged abdominal or pelvic lymph nodes. Reproductive: Prostate is unremarkable. Other: No free fluid, fluid collection, or free air. Musculoskeletal: No acute or abnormal lytic or blastic osseous lesions. IMPRESSION: 1. Interval development of multiloculated cystic focus centered within segment 7 measuring 4.5 x 3.4 x 7.6 cm with surrounding parenchymal hyperenhancement, suspicious for hepatic abscess. 2. Persistent intra hepatic bile duct dilation to the level of the portal confluence, where long  segment narrowing of the common bile duct is again seen. 3. Sequela of chronic pancreatitis with diffuse parenchymal  atrophy and bulky stone in the region of the pancreatic head. 4. Similar splenomegaly. 5. 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 6.  Aortic Atherosclerosis (ICD10-I70.0). Electronically Signed   By: Agustin Cree M.D.   On: 12/12/2022 10:39   US Abdomen Complete  Result Date: 12/12/2022 CLINICAL DATA:  Upper abdominal pain EXAM: ABDOMEN ULTRASOUND COMPLETE COMPARISON:  CT abdomen and pelvis 08/19/2016 FINDINGS: Gallbladder: No gallstones or wall thickening visualized. No sonographic Murphy sign noted by sonographer. Common bile duct: Diameter: 3.7 mm Liver: No focal lesion identified. Within normal limits in parenchymal echogenicity. Portal vein is patent on color Doppler imaging with normal direction of blood flow towards the liver. IVC: No abnormality visualized. Pancreas: Visualized portion unremarkable. Spleen: Size and appearance within normal limits. Right Kidney: Length: 9.6 cm. Echogenicity within normal limits. No mass or hydronephrosis visualized. Left Kidney: Length: 10.4 cm. Echogenicity within normal limits. No mass or hydronephrosis visualized. There is a 2.5 cm simple cyst left kidney. No imaging follow-up needed. Abdominal aorta: No aneurysm visualized. Other findings: None. IMPRESSION: No cholelithiasis or sonographic evidence for acute cholecystitis. Electronically Signed   By: Annia Belt M.D.   On: 12/12/2022 08:06    Anti-infectives: Anti-infectives (From admission, onward)    Start     Dose/Rate Route Frequency Ordered Stop   12/12/22 1600  piperacillin-tazobactam (ZOSYN) IVPB 3.375 g        3.375 g 12.5 mL/hr over 240 Minutes Intravenous Every 8 hours 12/12/22 1304     12/12/22 0745  piperacillin-tazobactam (ZOSYN) IVPB 3.375 g        3.375 g 100 mL/hr over 30 Minutes Intravenous  Once 12/12/22 0737 12/12/22 1154       Assessment/Plan: Multiloculated hepatic abscess - Patient with complex history listed above who was  admitted yesterday after presenting with RUQ pain and fever and a CT scan showing a recurrent 4.5 x 3.4 x 7.6 cm multiloculated suspected hepatic abscess.   IR to attempt drain He is not a surgical candidate for here given his cirrhosis, platelets, etc. If drain not successful, he would need evaluation at a tertiary care center for any surgical consideration. There is nothing else for [place] to offer. Will sign off for now  Moderately complex medical decision making   Abigail Miyamoto MD 12/14/2022

## 2022-12-14 NOTE — Procedures (Signed)
Interventional Radiology Procedure Note  Procedure: CT guided hepatic abscess drain placement  Findings: Please refer to procedural dictation for full description. 10 Fr pigtail drain placed in right hepatic complex cystic mass.  Approximately 15 mL sanguinous, malodorous fluid aspirated, sent for culture.  Drain to bulb suction.  Complications: None immediate  Estimated Blood Loss: < 5 mL  Recommendations: Keep to bulb suction. Follow culture. IR will follow.   Marliss Coots, MD

## 2022-12-14 NOTE — Progress Notes (Signed)
[facility] Progress Note  Lawrence Brock 52 y.o. Nov 12, 1970  CC:   Multifocal hepatic abscess   Subjective: Patient seen and examined at bedside.  No acute issues.  ROS : Negative for chest pain and shortness of breath.   Objective: Vital signs in last 24 hours: Vitals:   12/14/22 0649 12/14/22 0803  BP:  117/81  Pulse:  (!) 102  Resp:  18  Temp: 99.5 F (37.5 C) 100.1 F (37.8 C)  SpO2:  96%    Physical Exam: Resting comfortably in the bed, not in acute distress.  Drain on the right upper quadrant noted with some hemorrhagic fluid collection.  Abdomen is soft, mild discomfort on palpation, no peritoneal signs  Lab Results: Recent Labs    12/12/22 0024 12/12/22 1151 12/13/22 0608  NA 125*  --  135  K 4.1  --  4.1  CL 92*  --  102  CO2 24  --  23  GLUCOSE 225*  --  161*  BUN 20  --  17  CREATININE 1.02 0.86 0.86  CALCIUM 8.5*  --  7.8*  MG  --  1.7  --    Recent Labs    12/12/22 0024 12/13/22 0608  AST 20 46*  ALT 34 48*  ALKPHOS 407* 308*  BILITOT 2.5* 2.0*  PROT 7.6 6.6  ALBUMIN 3.0* 2.4*   Recent Labs    12/12/22 0024 12/12/22 1151 12/13/22 0608  WBC 16.1* 16.0* 14.4*  NEUTROABS 14.7*  --   --   HGB 11.7* 10.4* 12.2*  HCT 34.3* 30.7* 36.7*  MCV 86.0 86.5 87.6  PLT 105* 80* 68*   Recent Labs    12/13/22 0608  LABPROT 16.9*  INR 1.4*      Assessment/Plan: -Multifocal hepatic abscesses in a patient with history of cholangitis and biliary stricture with stent placement in the past -History of alcohol induced cirrhosis -Biliary ductal dilation with known history of biliary stricture -Chronic pancreatitis  Recommendations -------------------------- -IR guided drain placement today. -Continue other supportive care - Dr. Elnoria Howard will follow-up on Monday   Kathi Der MD, FACP 12/14/2022, 9:25 AM  Contact #  [phone]

## 2022-12-14 NOTE — Progress Notes (Signed)
Pharmacy Antibiotic Note  Lawrence Brock is a 52 y.o. male admitted on 12/11/2022 with  intra-abdominal infection .  Pharmacy has been consulted for Zosyn dosing for sepsis secondary to hepatic abscess.  Plan: - Zosyn 3.375g IV Q8H infused over 4hrs.  - Follow up renal function, culture results, and clinical course.   Height: 5\' 8"  (172.7 cm) Weight: 54.4 kg (120 lb) IBW/kg (Calculated) : 68.4  Temp (24hrs), Avg:99.9 F (37.7 C), Min:98.2 F (36.8 C), Max:101 F (38.3 C)  Recent Labs  Lab 12/12/22 0024 12/12/22 0031 12/12/22 0813 12/12/22 1151 12/13/22 0608  WBC 16.1*  --   --  16.0* 14.4*  CREATININE 1.02  --   --  0.86 0.86  LATICACIDVEN  --  1.1 1.5  --   --     Estimated Creatinine Clearance: 78.2 mL/min (by C-G formula based on SCr of 0.86 mg/dL).    No Known Allergies  Antimicrobials this admission: 9/19 Zosyn >>   Dose adjustments this admission:   Microbiology results: 9/19 BCx: 1/4: GPR 9/21 abscess from liver: sent  Thank you for allowing pharmacy to be a part of this patient's care.  Tacy Learn, PharmD Clinical Pharmacist 12/14/2022 1:27 PM

## 2022-12-15 ENCOUNTER — Encounter (HOSPITAL_COMMUNITY): Payer: Self-pay | Admitting: Family Medicine

## 2022-12-15 DIAGNOSIS — K750 Abscess of liver: Secondary | ICD-10-CM | POA: Diagnosis not present

## 2022-12-15 LAB — COMPREHENSIVE METABOLIC PANEL
ALT: 51 U/L — ABNORMAL HIGH (ref 0–44)
AST: 44 U/L — ABNORMAL HIGH (ref 15–41)
Albumin: 1.8 g/dL — ABNORMAL LOW (ref 3.5–5.0)
Alkaline Phosphatase: 306 U/L — ABNORMAL HIGH (ref 38–126)
Anion gap: 4 — ABNORMAL LOW (ref 5–15)
BUN: 15 mg/dL (ref 6–20)
CO2: 24 mmol/L (ref 22–32)
Calcium: 7.2 mg/dL — ABNORMAL LOW (ref 8.9–10.3)
Chloride: 109 mmol/L (ref 98–111)
Creatinine, Ser: 0.81 mg/dL (ref 0.61–1.24)
GFR, Estimated: >60 mL/min (ref >60)
Glucose, Bld: 264 mg/dL — ABNORMAL HIGH (ref 70–99)
Potassium: 3.7 mmol/L (ref 3.5–5.1)
Sodium: 137 mmol/L (ref 135–145)
Total Bilirubin: 0.9 mg/dL (ref 0.3–1.2)
Total Protein: 5.6 g/dL — ABNORMAL LOW (ref 6.5–8.1)

## 2022-12-15 LAB — CBC WITH DIFFERENTIAL/PLATELET
Abs Immature Granulocytes: 0.07 10*3/uL (ref 0.00–0.07)
Basophils Absolute: 0 10*3/uL (ref 0.0–0.1)
Basophils Relative: 0 %
Eosinophils Absolute: 0 10*3/uL (ref 0.0–0.5)
Eosinophils Relative: 0 %
HCT: 32.7 % — ABNORMAL LOW (ref 39.0–52.0)
Hemoglobin: 10.8 g/dL — ABNORMAL LOW (ref 13.0–17.0)
Immature Granulocytes: 1 %
Lymphocytes Relative: 3 %
Lymphs Abs: 0.3 10*3/uL — ABNORMAL LOW (ref 0.7–4.0)
MCH: 29.4 pg (ref 26.0–34.0)
MCHC: 33 g/dL (ref 30.0–36.0)
MCV: 89.1 fL (ref 80.0–100.0)
Monocytes Absolute: 0.4 10*3/uL (ref 0.1–1.0)
Monocytes Relative: 4 %
Neutro Abs: 8.8 10*3/uL — ABNORMAL HIGH (ref 1.7–7.7)
Neutrophils Relative %: 92 %
Platelets: 58 10*3/uL — ABNORMAL LOW (ref 150–400)
RBC: 3.67 MIL/uL — ABNORMAL LOW (ref 4.22–5.81)
RDW: 13.9 % (ref 11.5–15.5)
WBC: 9.6 10*3/uL (ref 4.0–10.5)
nRBC: 0 % (ref 0.0–0.2)

## 2022-12-15 LAB — GLUCOSE, CAPILLARY
Glucose-Capillary: 240 mg/dL — ABNORMAL HIGH (ref 70–99)
Glucose-Capillary: 164 mg/dL — ABNORMAL HIGH (ref 70–99)
Glucose-Capillary: 158 mg/dL — ABNORMAL HIGH (ref 70–99)
Glucose-Capillary: 155 mg/dL — ABNORMAL HIGH (ref 70–99)

## 2022-12-15 MED ORDER — HYDROCODONE-ACETAMINOPHEN 7.5-325 MG PO TABS
1.0000 | ORAL_TABLET | Freq: Four times a day (QID) | ORAL | Status: DC | PRN
  Administered 2022-12-15: 1 via ORAL
  Administered 2022-12-16 – 2022-12-21 (×9): 2 via ORAL
  Filled 2022-12-15: qty 1
  Filled 2022-12-15 (×9): qty 2

## 2022-12-15 NOTE — Progress Notes (Signed)
PROGRESS NOTE    Lawrence Brock  WUJ:[id number]  DOB: 10/22/1970  DOA: 12/11/2022 PCP: Verlon Au, MD Outpatient Specialists:   Hospital course:  Lawrence Brock is a 52 y.o. 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  Workup revealed multifocal hepatic abscess.  Patient was seen by gastroenterology and general surgery who recommended aspiration via IR.  Subjective:  Patient notes increased discomfort in his abdominal area after instrumentation yesterday.  Notes that it is just throbbing.  Notes that he has taken hydrocodone at home without difficulty but does not have a good reaction to oxycodone morphine or Dilaudid.   He also feels like "I am beginning to get a fever again, I can tell".  Objective: Vitals:   12/15/22 0800 12/15/22 0900 12/15/22 1000 12/15/22 1100  BP: 115/84 112/81 (!) 140/97 114/82  Pulse: 87 83 94 97  Resp: 20 (!) 30 (!) 26 (!) 30  Temp:      TempSrc:      SpO2: 97% 99% 99% 96%  Weight:      Height:        Intake/Output Summary (Last 24 hours) at 12/15/2022 1112 Last data filed at 12/15/2022 0800 Gross per 24 hour  Intake 127.77 ml  Output 510 ml  Net -382.23 ml   Filed Weights   12/12/22 0010 12/14/22 1655  Weight: 54.4 kg 60.9 kg     Exam:  General: Patient lying in bed in better spirits than yesterday with attentive and caring wife at bedside Eyes: sclera anicteric, conjuctiva mild injection bilaterally CVS: S1-S2, regular  Respiratory:  decreased air entry bilaterally secondary to decreased inspiratory effort, rales at bases  GI: NABS, abdomen with somewhat increased ascites than yesterday, still soft, he does have some tenderness to palpation right/epigastric area, no rebound tenderness. LE: Warm and  well-perfused, no edema Neuro: A/O x 3,  grossly nonfocal.  Psych: patient is logical and coherent, judgement and insight appear normal, mood and affect appropriate to situation.  Data Reviewed:  Basic Metabolic Panel: Recent Labs  Lab 12/12/22 0024 12/12/22 1151 12/13/22 0608 12/15/22 0302  NA 125*  --  135 137  K 4.1  --  4.1 3.7  CL 92*  --  102 109  CO2 24  --  23 24  GLUCOSE 225*  --  161* 264*  BUN 20  --  17 15  CREATININE 1.02 0.86 0.86 0.81  CALCIUM 8.5*  --  7.8* 7.2*  MG  --  1.7  --   --     CBC: Recent Labs  Lab 12/12/22 0024 12/12/22 1151 12/13/22 0608 12/15/22 0302  WBC 16.1* 16.0* 14.4* 9.6  NEUTROABS 14.7*  --   --  8.8*  HGB 11.7* 10.4* 12.2* 10.8*  HCT 34.3* 30.7* 36.7* 32.7*  MCV 86.0 86.5 87.6 89.1  PLT 105* 80* 68* 58*     Scheduled Meds:  Chlorhexidine Gluconate Cloth  6 each Topical Daily   enoxaparin (LOVENOX) injection  40 mg Subcutaneous Daily   insulin aspart  0-15 Units Subcutaneous TID WC   insulin aspart  0-5 Units Subcutaneous QHS   nicotine  14 mg Transdermal Daily   pantoprazole (PROTONIX) IV  40 mg Intravenous Q24H   sodium chloride flush  5 mL Intracatheter Q8H   Continuous Infusions:  sodium chloride Stopped (  12/12/22 1711)   piperacillin-tazobactam (ZOSYN)  IV 3.375 g (12/15/22 0757)     Assessment & Plan:   Multifocal hepatic abscess--POA Ongoing sepsis physiology--POA Patient underwent liver abscess aspiration by IR yesterday Immediately after aspiration, patient was febrile, tachycardic and hypotensive  Patient was transferred to stepdown and treated with 2 L crystalloid bolus with improvement in BP--would consider colloids/albumin if BP falls again especially given modest increase in ascites, however patient without peripheral edema. Leukocytosis is improved after aspiration Patient remains on Zosyn pending culture results  Pain management after instrumentation yesterday Chronic pancreatitis Will temporarily  increase Norco 7.5 to 1-2 tabs every 6 hours as needed Will need to keep Tylenol dosing less than 1800 mg daily DC ibuprofen Patient states he does not have a good reaction to oxycodone and notes that he has gotten confused with morphine and Dilaudid in the past  New onset DM 2 Blood sugars are elevated this morning, patient and wife note that he had fruit for breakfast. Discussed with wife that she should not be bringing fruit or other sweet foods and without discussing it with dietary. Will not change SSI coverage at present but may need to add long-acting insulin when patient's diet is more stable.    DVT prophylaxis: SCD Code Status: Full Family Communication: None today     Studies: CT GUIDED PERITONEAL/RETROPERITONEAL FLUID DRAIN BY PERC CATH  Result Date: 12/14/2022 INDICATION: 52 year old male presenting with hepatic fluid collection and leukocytosis concerning for abscess. EXAM: CT PERC DRAIN PERITONEAL ABCESS COMPARISON:  None Available. MEDICATIONS: The patient is currently admitted to the hospital and receiving intravenous antibiotics. The antibiotics were administered within an appropriate time frame prior to the initiation of the procedure. ANESTHESIA/SEDATION: Moderate (conscious) sedation was employed during this procedure. A total of Versed 1.5 mg and Fentanyl 25 mcg was administered intravenously. Moderate Sedation Time: 5 minutes. The patient's level of consciousness and vital signs were monitored continuously by radiology nursing throughout the procedure under my direct supervision. CONTRAST:  None COMPLICATIONS: None immediate. PROCEDURE: RADIATION DOSE REDUCTION: This exam was performed according to the departmental dose-optimization program which includes automated exposure control, adjustment of the mA and/or kV according to patient size and/or use of iterative reconstruction technique. Informed written consent was obtained from the patient after a discussion of the  risks, benefits and alternatives to treatment. The patient was placed in the left lateral decubitus position on the CT gantry and a pre procedural CT was performed re-demonstrating the known abscess/fluid collection within the right lobe of the liver. The procedure was planned. A timeout was performed prior to the initiation of the procedure. The right posterior flank was prepped and draped in the usual sterile fashion. The overlying soft tissues were anesthetized with 1% lidocaine with epinephrine. Appropriate trajectory was planned with the use of a 22 gauge spinal needle. An 18 gauge trocar needle was advanced into the abscess/fluid collection and a short Amplatz super stiff wire was coiled within the collection. Appropriate positioning was confirmed with a limited CT scan. The tract was serially dilated allowing placement of a 10 [place] all-purpose drainage catheter. Appropriate positioning was confirmed with a limited postprocedural CT scan. A proximally 15 ml of sanguinous, purulent fluid was aspirated. The tube was connected to a bulb suction and sutured in place. A dressing was placed. The patient tolerated the procedure well without immediate post procedural complication. IMPRESSION: Successful CT guided placement of a 10 French all purpose drain catheter into the right hepatic fluid collection with  aspiration of proximally 15 mL of sanguinous, purulent fluid. Samples were sent to the laboratory as requested by the ordering clinical team. Marliss Coots, MD [facility] Radiology Electronically Signed   By: Marliss Coots M.D.   On: 12/14/2022 18:39    Principal Problem:   Sepsis (HCC) Active Problems:   Thrombocytopenia, unspecified (HCC)   Tobacco abuse   Hepatic abscess   Common biliary duct stricture   Pancytopenia (HCC)   Portal vein thrombosis   Splenomegaly     Velta Rockholt Orma Flaming, [facility]  If 7PM-7AM, please contact  night-coverage [email or web address]   LOS: 3 days

## 2022-12-15 NOTE — Plan of Care (Signed)
  Problem: Fluid Volume: Goal: Hemodynamic stability will improve Outcome: Progressing   Problem: Clinical Measurements: Goal: Signs and symptoms of infection will decrease Outcome: Progressing   Problem: Respiratory: Goal: Ability to maintain adequate ventilation will improve Outcome: Progressing   Problem: Education: Goal: Knowledge of General Education information will improve Description: Including pain rating scale, medication(s)/side effects and non-pharmacologic comfort measures Outcome: Progressing   Problem: Clinical Measurements: Goal: Ability to maintain clinical measurements within normal limits will improve Outcome: Progressing Goal: Diagnostic test results will improve Outcome: Progressing   Problem: Activity: Goal: Risk for activity intolerance will decrease Outcome: Progressing   Problem: Coping: Goal: Level of anxiety will decrease Outcome: Progressing   Problem: Elimination: Goal: Will not experience complications related to urinary retention Outcome: Progressing   Problem: Skin Integrity: Goal: Risk for impaired skin integrity will decrease Outcome: Progressing   Problem: Education: Goal: Ability to describe self-care measures that may prevent or decrease complications (Diabetes Survival Skills Education) will improve Outcome: Progressing   Problem: Skin Integrity: Goal: Risk for impaired skin integrity will decrease Outcome: Progressing

## 2022-12-15 NOTE — Plan of Care (Signed)
  Problem: Clinical Measurements: Goal: Diagnostic test results will improve Outcome: Progressing Goal: Signs and symptoms of infection will decrease Outcome: Progressing   Problem: Respiratory: Goal: Ability to maintain adequate ventilation will improve Outcome: Progressing   Problem: Clinical Measurements: Goal: Ability to maintain clinical measurements within normal limits will improve Outcome: Progressing   Problem: Coping: Goal: Level of anxiety will decrease Outcome: Progressing   Problem: Safety: Goal: Ability to remain free from injury will improve Outcome: Progressing

## 2022-12-16 DIAGNOSIS — A419 Sepsis, unspecified organism: Secondary | ICD-10-CM | POA: Diagnosis not present

## 2022-12-16 LAB — COMPREHENSIVE METABOLIC PANEL
ALT: 38 U/L (ref 0–44)
AST: 23 U/L (ref 15–41)
Albumin: 1.8 g/dL — ABNORMAL LOW (ref 3.5–5.0)
Alkaline Phosphatase: 278 U/L — ABNORMAL HIGH (ref 38–126)
Anion gap: 8 (ref 5–15)
BUN: 15 mg/dL (ref 6–20)
CO2: 25 mmol/L (ref 22–32)
Calcium: 7.6 mg/dL — ABNORMAL LOW (ref 8.9–10.3)
Chloride: 102 mmol/L (ref 98–111)
Creatinine, Ser: 0.8 mg/dL (ref 0.61–1.24)
GFR, Estimated: >60 mL/min (ref >60)
Glucose, Bld: 146 mg/dL — ABNORMAL HIGH (ref 70–99)
Potassium: 3.7 mmol/L (ref 3.5–5.1)
Sodium: 135 mmol/L (ref 135–145)
Total Bilirubin: 1.2 mg/dL (ref 0.3–1.2)
Total Protein: 5.7 g/dL — ABNORMAL LOW (ref 6.5–8.1)

## 2022-12-16 LAB — GLUCOSE, CAPILLARY
Glucose-Capillary: 137 mg/dL — ABNORMAL HIGH (ref 70–99)
Glucose-Capillary: 171 mg/dL — ABNORMAL HIGH (ref 70–99)
Glucose-Capillary: 177 mg/dL — ABNORMAL HIGH (ref 70–99)
Glucose-Capillary: 189 mg/dL — ABNORMAL HIGH (ref 70–99)

## 2022-12-16 LAB — CBC WITH DIFFERENTIAL/PLATELET
Abs Immature Granulocytes: 0.09 10*3/uL — ABNORMAL HIGH (ref 0.00–0.07)
Basophils Absolute: 0 10*3/uL (ref 0.0–0.1)
Basophils Relative: 0 %
Eosinophils Absolute: 0 10*3/uL (ref 0.0–0.5)
Eosinophils Relative: 0 %
HCT: 31.7 % — ABNORMAL LOW (ref 39.0–52.0)
Hemoglobin: 10.3 g/dL — ABNORMAL LOW (ref 13.0–17.0)
Immature Granulocytes: 1 %
Lymphocytes Relative: 3 %
Lymphs Abs: 0.3 10*3/uL — ABNORMAL LOW (ref 0.7–4.0)
MCH: 28.9 pg (ref 26.0–34.0)
MCHC: 32.5 g/dL (ref 30.0–36.0)
MCV: 89 fL (ref 80.0–100.0)
Monocytes Absolute: 0.6 10*3/uL (ref 0.1–1.0)
Monocytes Relative: 6 %
Neutro Abs: 8.5 10*3/uL — ABNORMAL HIGH (ref 1.7–7.7)
Neutrophils Relative %: 90 %
Platelets: 55 10*3/uL — ABNORMAL LOW (ref 150–400)
RBC: 3.56 MIL/uL — ABNORMAL LOW (ref 4.22–5.81)
RDW: 13.8 % (ref 11.5–15.5)
WBC: 9.5 10*3/uL (ref 4.0–10.5)
nRBC: 0 % (ref 0.0–0.2)

## 2022-12-16 LAB — MISC LABCORP TEST (SEND OUT): Labcorp test code: 104018

## 2022-12-16 MED ORDER — AMOXICILLIN-POT CLAVULANATE 875-125 MG PO TABS
1.0000 | ORAL_TABLET | Freq: Two times a day (BID) | ORAL | Status: DC
  Administered 2022-12-16 – 2022-12-17 (×2): 1 via ORAL
  Filled 2022-12-16 (×2): qty 1

## 2022-12-16 MED ORDER — FLUOXETINE HCL 10 MG PO CAPS
10.0000 mg | ORAL_CAPSULE | Freq: Every day | ORAL | Status: DC
  Administered 2022-12-16 – 2022-12-21 (×6): 10 mg via ORAL
  Filled 2022-12-16 (×6): qty 1

## 2022-12-16 MED ORDER — METHOCARBAMOL 500 MG PO TABS: 500.0000 mg | ORAL_TABLET | Freq: Three times a day (TID) | ORAL | Status: DC

## 2022-12-16 MED ORDER — PANTOPRAZOLE SODIUM 40 MG PO TBEC
40.0000 mg | DELAYED_RELEASE_TABLET | Freq: Every day | ORAL | Status: DC
  Administered 2022-12-16 – 2022-12-20 (×5): 40 mg via ORAL
  Filled 2022-12-16 (×5): qty 1

## 2022-12-16 MED ORDER — METHOCARBAMOL 500 MG PO TABS: 500.0000 mg | ORAL_TABLET | Freq: Three times a day (TID) | ORAL | Status: DC | PRN

## 2022-12-16 MED ORDER — MOMETASONE FURO-FORMOTEROL FUM 200-5 MCG/ACT IN AERO
2.0000 | INHALATION_SPRAY | Freq: Two times a day (BID) | RESPIRATORY_TRACT | Status: DC
  Administered 2022-12-16 – 2022-12-21 (×10): 2 via RESPIRATORY_TRACT
  Filled 2022-12-16: qty 8.8

## 2022-12-16 NOTE — Progress Notes (Signed)
Referring Physician(s): Hung,P  Supervising Physician: Malachy Moan  Patient Status:  [facility] - In-pt  Chief Complaint:  Abdominal pain/hepatic abscess  Subjective: Patient feeling a little better today.  Denies worsening abdominal pain, nausea, vomiting; does have occasional chill   Allergies: Patient has no known allergies.  Medications: Prior to Admission medications   Medication Sig Start Date End Date Taking? Authorizing Provider  dicyclomine (BENTYL) 20 MG tablet Take 20 mg by mouth in the morning and at bedtime.    [provider]  FLUoxetine (PROZAC) 10 MG tablet Take 10 mg by mouth daily. 12/07/22   [provider]  HYDROcodone-acetaminophen (NORCO) 7.5-325 MG tablet Take 1 tablet by mouth every 6 (six) hours as needed for moderate pain.    [provider]  metFORMIN (GLUCOPHAGE) 1000 MG tablet Take 1 tablet by mouth daily with breakfast. 10/07/22   [provider]  methocarbamol (ROBAXIN) 500 MG tablet Take 500 mg by mouth 3 (three) times daily. 09/23/22   [provider]  nicotine (NICODERM CQ - DOSED IN MG/24 HOURS) 21 mg/24hr patch Place 1 patch (21 mg total) onto the skin daily. 05/12/22   Almon Hercules, MD  pantoprazole (PROTONIX) 40 MG tablet Take 1 tablet (40 mg total) by mouth 2 (two) times daily for 30 days, THEN 1 tablet (40 mg total) daily. 05/12/22 12/12/22  Almon Hercules, MD  polyethylene glycol powder (MIRALAX) 17 GM/SCOOP powder Take 17 g by mouth 2 (two) times daily as needed for moderate constipation or mild constipation. 05/12/22   Almon Hercules, MD  propranolol (INDERAL) 20 MG tablet Take 1 tablet (20 mg total) by mouth 2 (two) times daily. 05/12/22 12/12/22  Almon Hercules, MD  spironolactone (ALDACTONE) 50 MG tablet Take 50 mg by mouth daily. 11/06/22   [provider]  SYMBICORT 160-4.5 MCG/ACT inhaler Inhale 2 puffs into the lungs 2 (two) times daily. 11/11/22   [provider]  triamcinolone  cream (KENALOG) 0.5 % Apply topically. 08/29/22   [provider]     Vital Signs: BP 119/71 (BP Location: Right Arm)   Pulse 100   Temp 99.3 F (37.4 C) (Oral)   Resp 15   Ht 5\' 8"  (1.727 m)   Wt 134 lb 4.2 oz (60.9 kg)   SpO2 97%   BMI 20.41 kg/m   Physical Exam awake, alert.  Right hepatic drain intact, insertion site okay, not significantly tender, output about 15 cc blood-tinged fluid, drain flushed with minimal return  Imaging: CT GUIDED PERITONEAL/RETROPERITONEAL FLUID DRAIN BY PERC CATH  Result Date: 12/14/2022 INDICATION: 52 year old male presenting with hepatic fluid collection and leukocytosis concerning for abscess. EXAM: CT PERC DRAIN PERITONEAL ABCESS COMPARISON:  None Available. MEDICATIONS: The patient is currently admitted to the hospital and receiving intravenous antibiotics. The antibiotics were administered within an appropriate time frame prior to the initiation of the procedure. ANESTHESIA/SEDATION: Moderate (conscious) sedation was employed during this procedure. A total of Versed 1.5 mg and Fentanyl 25 mcg was administered intravenously. Moderate Sedation Time: 5 minutes. The patient's level of consciousness and vital signs were monitored continuously by radiology nursing throughout the procedure under my direct supervision. CONTRAST:  None COMPLICATIONS: None immediate. PROCEDURE: RADIATION DOSE REDUCTION: This exam was performed according to the departmental dose-optimization program which includes automated exposure control, adjustment of the mA and/or kV according to patient size and/or use of iterative reconstruction technique. Informed written consent was obtained from the patient after a discussion of the  risks, benefits and alternatives to treatment. The patient was placed in the left lateral decubitus position on the CT gantry and a pre procedural CT was performed re-demonstrating the known abscess/fluid collection within the right lobe of the liver. The  procedure was planned. A timeout was performed prior to the initiation of the procedure. The right posterior flank was prepped and draped in the usual sterile fashion. The overlying soft tissues were anesthetized with 1% lidocaine with epinephrine. Appropriate trajectory was planned with the use of a 22 gauge spinal needle. An 18 gauge trocar needle was advanced into the abscess/fluid collection and a short Amplatz super stiff wire was coiled within the collection. Appropriate positioning was confirmed with a limited CT scan. The tract was serially dilated allowing placement of a 10 [place] all-purpose drainage catheter. Appropriate positioning was confirmed with a limited postprocedural CT scan. A proximally 15 ml of sanguinous, purulent fluid was aspirated. The tube was connected to a bulb suction and sutured in place. A dressing was placed. The patient tolerated the procedure well without immediate post procedural complication. IMPRESSION: Successful CT guided placement of a 10 French all purpose drain catheter into the right hepatic fluid collection with aspiration of proximally 15 mL of sanguinous, purulent fluid. Samples were sent to the laboratory as requested by the ordering clinical team. Marliss Coots, MD [facility] Radiology Electronically Signed   By: Marliss Coots M.D.   On: 12/14/2022 18:39    Labs:  CBC: Recent Labs    12/12/22 1151 12/13/22 0608 12/15/22 0302 12/16/22 0457  WBC 16.0* 14.4* 9.6 9.5  HGB 10.4* 12.2* 10.8* 10.3*  HCT 30.7* 36.7* 32.7* 31.7*  PLT 80* 68* 58* 55*    COAGS: Recent Labs    05/10/22 0357 05/12/22 0428 05/13/22 0442 12/13/22 0608  INR 1.2 1.2 1.3* 1.4*  APTT 28  --  41*  --     BMP: Recent Labs    12/12/22 0024 12/12/22 1151 12/13/22 0608 12/15/22 0302 12/16/22 0457  NA 125*  --  135 137 135  K 4.1  --  4.1 3.7 3.7  CL 92*  --  102 109 102  CO2 24  --  23 24 25   GLUCOSE 225*  --  161* 264*  146*  BUN 20  --  17 15 15   CALCIUM 8.5*  --  7.8* 7.2* 7.6*  CREATININE 1.02 0.86 0.86 0.81 0.80  GFRNONAA >60 >60 >60 >60 >60    LIVER FUNCTION TESTS: Recent Labs    12/12/22 0024 12/13/22 0608 12/15/22 0302 12/16/22 0457  BILITOT 2.5* 2.0* 0.9 1.2  AST 20 46* 44* 23  ALT 34 48* 51* 38  ALKPHOS 407* 308* 306* 278*  PROT 7.6 6.6 5.6* 5.7*  ALBUMIN 3.0* 2.4* 1.8* 1.8*    Assessment and Plan: 52 year old male with a complex history of cholangitis, choledocholithiasis, biliary stricture with stent placement, chronic pancreatitis 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, 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, s/p variceal ligation, ETOH cirrhosis, SMV thrombus, hepatic abscess 2015, splenomegaly, and recent FUO from 09/19/2022 - 09/23/2022 admitted for complaints of fever,  RUQ pain and epigastric pain.+ family hx liver cancer in father ; status post drainage of right hepatic abscess/complex cystic mass on 9/21(10 fr to JP); afebrile, WBC normal, hemoglobin stable, creatinine normal, total bilirubin normal, platelets 55K; drain fluid culture with few Streptococcus constellatus; continue with drain irrigation, close output monitoring, lab checks, check  drain fluid cytology; obtain follow-up CT approximately 1 week post drain placement or sooner if WBC increases   Electronically Signed: D. Jeananne [name], PA-C 12/16/2022, 1:53 PM   I spent a total of 15 Minutes at the the patient's bedside AND on the patient's hospital floor or unit, greater than 50% of which was counseling/coordinating care for right hepatic abscess drain    Patient ID: Lawrence Brock, male   DOB: May 22, 1970, 52 y.o.   MRN: [id number]

## 2022-12-16 NOTE — Plan of Care (Signed)

## 2022-12-16 NOTE — Progress Notes (Signed)
PHARMACY NOTE -  Zosyn  Pharmacy has been assisting with dosing of Zosyn for hepatic abscess. Dosage remains stable at 3.375 g IV q8 hr and further renal adjustments per institutional Pharmacy antibiotic protocol Hepatic abscess Cx grew out S constellatus Pt noted to have septic response immediately after aspiration of liver abscess; per IR this is commonly seen following disruption of liver abscesses and currently stable Consider narrowing to Unasyn or Augmentin as tolerated  Pharmacy will sign off, following peripherally for culture results, dose adjustments, and length of therapy. Please reconsult if a change in clinical status warrants re-evaluation of dosage.  Bernadene Person, PharmD, BCPS [phone] 12/16/2022, 12:52 PM

## 2022-12-16 NOTE — Progress Notes (Signed)
UNASSIGNED PATIENT Subjective: Patient seen and examined chart reviewed.  Patient is anxious to go home.  He states his symptoms that he had after his hepatic abscess drainage including the chills and abdominal discomfort have subsided. He is requesting that he be put on  Insulin rather than Metformin as he feels the Metformin is not controlling his blood sugars well. He follows up with Dr. Dorna Bloom at [facility] and has an appointment to see his physician next month.  Objective: Vital signs in last 24 hours: Temp:  [97.8 F (36.6 C)-98.3 F (36.8 C)] 98.1 F (36.7 C) (09/23 0503) Pulse Rate:  [76-109] 88 (09/23 0503) Resp:  [17-25] 17 (09/23 0503) BP: (94-117)/(73-85) 109/85 (09/23 0503) SpO2:  [95 %-99 %] 98 % (09/23 0503) Last BM Date : 12/16/22  Intake/Output from previous day: 09/22 0701 - 09/23 0700 In: 242 [P.O.:120; I.V.:5; IV Piggyback:97] Out: 115 [Urine:100; Drains:15] Intake/Output this shift: Total I/O In: 220 [P.O.:220] Out: -   General appearance: alert, cooperative, fatigued, and no distress Resp: clear to auscultation bilaterally Cardio: regular rate and rhythm, S1, S2 normal, no murmur, click, rub or gallop GI: soft, with epigastric tenderness on palpation with minimal gaurding, without rebound or rigidity; bowel sounds normal; no masses,  no organomegaly  Lab Results: Recent Labs    12/15/22 0302 12/16/22 0457  WBC 9.6 9.5  HGB 10.8* 10.3*  HCT 32.7* 31.7*  PLT 58* 55*   BMET Recent Labs    12/15/22 0302 12/16/22 0457  NA 137 135  K 3.7 3.7  CL 109 102  CO2 24 25  GLUCOSE 264* 146*  BUN 15 15  CREATININE 0.81 0.80  CALCIUM 7.2* 7.6*   LFT Recent Labs    12/16/22 0457  PROT 5.7*  ALBUMIN 1.8*  AST 23  ALT 38  ALKPHOS 278*  BILITOT 1.2   Studies/Results: No results found.  Medications: I have reviewed the patient's current medications. Prior to Admission:  Medications Prior to Admission  Medication Sig Dispense Refill  Last Dose   dicyclomine (BENTYL) 20 MG tablet Take 20 mg by mouth in the morning and at bedtime.      FLUoxetine (PROZAC) 10 MG tablet Take 10 mg by mouth daily.      HYDROcodone-acetaminophen (NORCO) 7.5-325 MG tablet Take 1 tablet by mouth every 6 (six) hours as needed for moderate pain.      metFORMIN (GLUCOPHAGE) 1000 MG tablet Take 1 tablet by mouth daily with breakfast.      methocarbamol (ROBAXIN) 500 MG tablet Take 500 mg by mouth 3 (three) times daily.      nicotine (NICODERM CQ - DOSED IN MG/24 HOURS) 21 mg/24hr patch Place 1 patch (21 mg total) onto the skin daily. 28 patch 0    pantoprazole (PROTONIX) 40 MG tablet Take 1 tablet (40 mg total) by mouth 2 (two) times daily for 30 days, THEN 1 tablet (40 mg total) daily. 120 tablet 0    polyethylene glycol powder (MIRALAX) 17 GM/SCOOP powder Take 17 g by mouth 2 (two) times daily as needed for moderate constipation or mild constipation. 255 g 2    propranolol (INDERAL) 20 MG tablet Take 1 tablet (20 mg total) by mouth 2 (two) times daily. 180 tablet 0    spironolactone (ALDACTONE) 50 MG tablet Take 50 mg by mouth daily.      SYMBICORT 160-4.5 MCG/ACT inhaler Inhale 2 puffs into the lungs 2 (two) times daily.      triamcinolone cream (KENALOG)  0.5 % Apply topically.      Scheduled:  Chlorhexidine Gluconate Cloth  6 each Topical Daily   enoxaparin (LOVENOX) injection  40 mg Subcutaneous Daily   insulin aspart  0-15 Units Subcutaneous TID WC   insulin aspart  0-5 Units Subcutaneous QHS   nicotine  14 mg Transdermal Daily   pantoprazole  40 mg Oral QHS   sodium chloride flush  5 mL Intracatheter Q8H   Continuous:  sodium chloride Stopped (12/12/22 1711)   piperacillin-tazobactam (ZOSYN)  IV 3.375 g (12/16/22 0754)   ZOX:[id number], ondansetron **OR** ondansetron (ZOFRAN) IV, mouth rinse  Assessment/Plan: 1) Multifocal hepatic abscesses in the setting of alcoholic cirrhosis and portal hypertension with history of  chronic pancreatitis-patient seems stable he has been advised to follow-up with his primary gastroenterologist at [facility]. 2) AODM.  LOS: 4 days   Charna Elizabeth 12/16/2022, 12:49 PM

## 2022-12-16 NOTE — Progress Notes (Signed)
[facility] Progress Note Patient: Lawrence Brock ZOX:[id number] DOB: Jun 21, 1970 DOA: 12/11/2022  DOS: the patient was seen and examined on 12/16/2022  Brief hospital course: PMH of EtOH cirrhosis, COPD, type II DM, HTN, chronic pancreatitis, portal vein thrombosis not on anticoagulation due to thrombocytopenia, active smoker, alcohol abuse in remission presented to the hospital with complaints of abdominal pain and fever.  Found to have multifocal hepatic abscess treated with drainage and antibiotic.  Assessment and Plan: Sepsis, present on admission secondary to multifocal hepatic abscess. Met SIRS criteria with fever, tachycardia and hypotension. Initially admitted to stepdown unit. Treated with IV fluid bolus. Currently transferred to floor. On IV antibiotic. IR was consulted. Underwent drain placement. Drain is actually growing strep constellatus. Will switch to oral Augmentin.  Mood disorder. Continue home regimen. Type 2 diabetes mellitus, currently holding oral hypoglycemic agent but Continue sliding scale insulin. GERD. Continue PPI twice daily. COPD.  Send no evidence of exacerbation but Resume home inhalers.  Thrombocytopenia. Chronic. Monitor.  Anemia. H&H relatively stable.  No active bleeding.  Monitor.  Type 2 diabetes mellitus, Newly diagnosed with Monitor for now.  Subjective: Abdominal pain resolved.  No nausea no vomiting no fever or chills.  Oral intake is still minimal.  No fever  Physical Exam: General: in Mild distress, No Rash Cardiovascular: S1 and S2 Present, No Murmur Respiratory: Good respiratory effort, Bilateral Air entry present. No Crackles, No wheezes Abdomen: Bowel Sound present, No tenderness Extremities: No edema Neuro: Alert and oriented x3, no new focal deficit  Data Reviewed: I have Reviewed nursing notes, Vitals, and Lab results. Since last encounter, pertinent lab results CBC and BMP   . I have ordered test including CBC and  BMP  .   Disposition: Status is: Inpatient Remains inpatient appropriate because: Pain control stability of oral diet intake  enoxaparin (LOVENOX) injection 40 mg Start: 12/12/22 1400   Family Communication: No one at bedside Level of care: Progressive   Vitals:   12/15/22 2101 12/16/22 0048 12/16/22 0503 12/16/22 1253  BP: 101/73 117/76 109/85 119/71  Pulse: 90 (!) 109 88 100  Resp: 18 20 17 15   Temp: 98.3 F (36.8 C) 98.2 F (36.8 C) 98.1 F (36.7 C) 99.3 F (37.4 C)  TempSrc: Oral Oral Oral Oral  SpO2: 99% 95% 98% 97%  Weight:      Height:         Author: Lynden Oxford, MD 12/16/2022 8:24 PM  Please look on [email or web address] to find out who is on call.

## 2022-12-17 ENCOUNTER — Inpatient Hospital Stay (HOSPITAL_COMMUNITY): Payer: BLUE CROSS/BLUE SHIELD

## 2022-12-17 ENCOUNTER — Encounter (HOSPITAL_COMMUNITY): Payer: Self-pay | Admitting: Family Medicine

## 2022-12-17 DIAGNOSIS — A419 Sepsis, unspecified organism: Secondary | ICD-10-CM | POA: Diagnosis not present

## 2022-12-17 LAB — CBC WITH DIFFERENTIAL/PLATELET
Abs Immature Granulocytes: 0.19 10*3/uL — ABNORMAL HIGH (ref 0.00–0.07)
Basophils Absolute: 0 10*3/uL (ref 0.0–0.1)
Basophils Relative: 0 %
Eosinophils Absolute: 0 10*3/uL (ref 0.0–0.5)
Eosinophils Relative: 0 %
HCT: 29.6 % — ABNORMAL LOW (ref 39.0–52.0)
Hemoglobin: 9.9 g/dL — ABNORMAL LOW (ref 13.0–17.0)
Immature Granulocytes: 1 %
Lymphocytes Relative: 3 %
Lymphs Abs: 0.5 10*3/uL — ABNORMAL LOW (ref 0.7–4.0)
MCH: 28.5 pg (ref 26.0–34.0)
MCHC: 33.4 g/dL (ref 30.0–36.0)
MCV: 85.3 fL (ref 80.0–100.0)
Monocytes Absolute: 0.8 10*3/uL (ref 0.1–1.0)
Monocytes Relative: 5 %
Neutro Abs: 13 10*3/uL — ABNORMAL HIGH (ref 1.7–7.7)
Neutrophils Relative %: 91 %
Platelets: 78 10*3/uL — ABNORMAL LOW (ref 150–400)
RBC: 3.47 MIL/uL — ABNORMAL LOW (ref 4.22–5.81)
RDW: 13.6 % (ref 11.5–15.5)
WBC: 14.5 10*3/uL — ABNORMAL HIGH (ref 4.0–10.5)
nRBC: 0 % (ref 0.0–0.2)

## 2022-12-17 LAB — CULTURE, BLOOD (ROUTINE X 2)
Culture: NO GROWTH
Special Requests: ADEQUATE

## 2022-12-17 LAB — COMPREHENSIVE METABOLIC PANEL
ALT: 29 U/L (ref 0–44)
AST: 20 U/L (ref 15–41)
Albumin: 2 g/dL — ABNORMAL LOW (ref 3.5–5.0)
Alkaline Phosphatase: 320 U/L — ABNORMAL HIGH (ref 38–126)
Anion gap: 11 (ref 5–15)
BUN: 10 mg/dL (ref 6–20)
CO2: 21 mmol/L — ABNORMAL LOW (ref 22–32)
Calcium: 7.5 mg/dL — ABNORMAL LOW (ref 8.9–10.3)
Chloride: 98 mmol/L (ref 98–111)
Creatinine, Ser: 0.7 mg/dL (ref 0.61–1.24)
GFR, Estimated: >60 mL/min (ref >60)
Glucose, Bld: 135 mg/dL — ABNORMAL HIGH (ref 70–99)
Potassium: 3.2 mmol/L — ABNORMAL LOW (ref 3.5–5.1)
Sodium: 130 mmol/L — ABNORMAL LOW (ref 135–145)
Total Bilirubin: 1.3 mg/dL — ABNORMAL HIGH (ref 0.3–1.2)
Total Protein: 6.2 g/dL — ABNORMAL LOW (ref 6.5–8.1)

## 2022-12-17 LAB — GLUCOSE, CAPILLARY
Glucose-Capillary: 145 mg/dL — ABNORMAL HIGH (ref 70–99)
Glucose-Capillary: 187 mg/dL — ABNORMAL HIGH (ref 70–99)
Glucose-Capillary: 123 mg/dL — ABNORMAL HIGH (ref 70–99)
Glucose-Capillary: 137 mg/dL — ABNORMAL HIGH (ref 70–99)

## 2022-12-17 LAB — MAGNESIUM: Magnesium: 1.9 mg/dL (ref 1.7–2.4)

## 2022-12-17 LAB — C-REACTIVE PROTEIN: CRP: 21.8 mg/dL — ABNORMAL HIGH (ref <1.0)

## 2022-12-17 LAB — PROCALCITONIN: Procalcitonin: 19.17 ng/mL

## 2022-12-17 LAB — SEDIMENTATION RATE: Sed Rate: 93 mm/hr — ABNORMAL HIGH (ref 0–16)

## 2022-12-17 MED ORDER — POTASSIUM CHLORIDE CRYS ER 20 MEQ PO TBCR
40.0000 meq | EXTENDED_RELEASE_TABLET | Freq: Once | ORAL | Status: AC
  Administered 2022-12-17: 40 meq via ORAL
  Filled 2022-12-17: qty 2

## 2022-12-17 MED ORDER — IOHEXOL 9 MG/ML PO SOLN
ORAL | Status: AC
  Filled 2022-12-17: qty 1000

## 2022-12-17 MED ORDER — IOHEXOL 9 MG/ML PO SOLN
500.0000 mL | ORAL | Status: AC
  Administered 2022-12-17 (×2): 500 mL via ORAL

## 2022-12-17 MED ORDER — SODIUM CHLORIDE 0.9 % IV SOLN
3.0000 g | Freq: Four times a day (QID) | INTRAVENOUS | Status: DC
  Administered 2022-12-17 – 2022-12-20 (×11): 3 g via INTRAVENOUS
  Filled 2022-12-17 (×12): qty 8

## 2022-12-17 MED ORDER — SODIUM CHLORIDE (PF) 0.9 % IJ SOLN
INTRAMUSCULAR | Status: AC
  Filled 2022-12-17: qty 50

## 2022-12-17 MED ORDER — IOHEXOL 300 MG/ML  SOLN
100.0000 mL | Freq: Once | INTRAMUSCULAR | Status: AC | PRN
  Administered 2022-12-17: 100 mL via INTRAVENOUS

## 2022-12-17 MED ORDER — ACETAMINOPHEN 325 MG PO TABS
650.0000 mg | ORAL_TABLET | Freq: Four times a day (QID) | ORAL | Status: DC | PRN
  Administered 2022-12-17 – 2022-12-20 (×4): 650 mg via ORAL
  Filled 2022-12-17 (×4): qty 2

## 2022-12-17 NOTE — Progress Notes (Signed)
[facility] Progress Note Patient: Lawrence Brock XBJ:[id number] DOB: 07-16-1970 DOA: 12/11/2022  DOS: the patient was seen and examined on 12/17/2022  Brief hospital course: PMH of EtOH cirrhosis, COPD, type II DM, HTN, chronic pancreatitis, portal vein thrombosis not on anticoagulation due to thrombocytopenia, active smoker, alcohol abuse in remission presented to the hospital with complaints of abdominal pain and fever.  Found to have multifocal hepatic abscess treated with drainage and antibiotic. Due to persistent fever repeat CT abdomen performed 9/24 showed evidence of increase in the size of the abscess.   Assessment and Plan: Sepsis, present on admission secondary to multifocal hepatic abscess. Met SIRS criteria with fever, tachycardia and hypotension. Initially admitted to stepdown unit. Treated with IV fluid bolus. Initially was on IV Zosyn.  Later on on IV Unasyn. General surgery was consulted.  Patient was apparently not a candidate for surgical intervention. IR was consulted. Underwent drain placement. Drain is actually growing strep constellatus. CT abdomen performed on 9/24 shows evidence of worsening abscess size.  There is also a linear air-filled area likely in the setting of a needle insertion for the drain placement. IR reconsulted.  Recommending n.p.o. after midnight   Mood disorder. Continue home regimen.  Type 2 diabetes mellitus, without complication, without long-term insulin use. Not on any medication prior to admission. hemoglobin A1c 8.4.  In the past 5.9. Continue sliding scale insulin.  GERD. Continue PPI twice daily. COPD.  Send no evidence of exacerbation but Resume home inhalers.   Thrombocytopenia. Chronic. Monitor.   Anemia. H&H relatively stable.  No active bleeding.  Monitor.  Subjective: Had a fever early this morning.  No other acute complaint.  No nausea no vomiting or diarrhea.  Physical Exam: General: in Mild distress, No  Rash Cardiovascular: S1 and S2 Present, No Murmur Respiratory: Good respiratory effort, Bilateral Air entry present. No Crackles, No wheezes Abdomen: Bowel Sound present, No tenderness Extremities: No edema Neuro: Alert and oriented x3, no new focal deficit  Data Reviewed: I have Reviewed nursing notes, Vitals, and Lab results. Since last encounter, pertinent lab results CBC and BMP   . I have ordered test including CBC, BMP, ESR, CRP, blood culture  . I have discussed pt's care plan and test results with IR  . I have ordered imaging CT abdomen and pelvis  .   Disposition: Status is: Inpatient Remains inpatient appropriate because: Need further treatment for abscess  enoxaparin (LOVENOX) injection 40 mg Start: 12/12/22 1400   Family Communication: No one at bedside Level of care: Progressive   Vitals:   12/17/22 0527 12/17/22 0833 12/17/22 1008 12/17/22 1137  BP: 117/86   119/85  Pulse: (!) 109   96  Resp: 20   16  Temp: 100.2 F (37.9 C)  98.8 F (37.1 C) 97.6 F (36.4 C)  TempSrc: Oral  Oral Oral  SpO2: 94% 92%  94%  Weight:      Height:         Author: Lynden Oxford, MD 12/17/2022 6:31 PM  Please look on [email or web address] to find out who is on call.

## 2022-12-17 NOTE — Progress Notes (Signed)
PHARMACY NOTE -  Unasyn  Pharmacy has been assisting with dosing of ampicillin/sulbactam for hepatic abscess. Dosage remains stable at 3g IV q6 hr and further renal adjustments per institutional Pharmacy antibiotic protocol  Pharmacy will sign off, following peripherally for culture results, dose adjustments, and length of therapy. Please reconsult if a change in clinical status warrants re-evaluation of dosage.  Bernadene Person, PharmD, BCPS [phone] 12/17/2022, 8:46 AM

## 2022-12-18 DIAGNOSIS — D696 Thrombocytopenia, unspecified: Secondary | ICD-10-CM | POA: Diagnosis not present

## 2022-12-18 DIAGNOSIS — K5900 Constipation, unspecified: Secondary | ICD-10-CM

## 2022-12-18 DIAGNOSIS — E119 Type 2 diabetes mellitus without complications: Secondary | ICD-10-CM

## 2022-12-18 DIAGNOSIS — K219 Gastro-esophageal reflux disease without esophagitis: Secondary | ICD-10-CM

## 2022-12-18 DIAGNOSIS — A419 Sepsis, unspecified organism: Secondary | ICD-10-CM | POA: Diagnosis not present

## 2022-12-18 DIAGNOSIS — J449 Chronic obstructive pulmonary disease, unspecified: Secondary | ICD-10-CM

## 2022-12-18 DIAGNOSIS — K750 Abscess of liver: Secondary | ICD-10-CM | POA: Diagnosis not present

## 2022-12-18 DIAGNOSIS — D649 Anemia, unspecified: Secondary | ICD-10-CM

## 2022-12-18 LAB — COMPREHENSIVE METABOLIC PANEL
ALT: 23 U/L (ref 0–44)
AST: 15 U/L (ref 15–41)
Albumin: 1.8 g/dL — ABNORMAL LOW (ref 3.5–5.0)
Alkaline Phosphatase: 275 U/L — ABNORMAL HIGH (ref 38–126)
Anion gap: 7 (ref 5–15)
BUN: 8 mg/dL (ref 6–20)
CO2: 23 mmol/L (ref 22–32)
Calcium: 7.5 mg/dL — ABNORMAL LOW (ref 8.9–10.3)
Chloride: 105 mmol/L (ref 98–111)
Creatinine, Ser: 0.69 mg/dL (ref 0.61–1.24)
GFR, Estimated: >60 mL/min (ref >60)
Glucose, Bld: 150 mg/dL — ABNORMAL HIGH (ref 70–99)
Potassium: 3.8 mmol/L (ref 3.5–5.1)
Sodium: 135 mmol/L (ref 135–145)
Total Bilirubin: 1.1 mg/dL (ref 0.3–1.2)
Total Protein: 5.8 g/dL — ABNORMAL LOW (ref 6.5–8.1)

## 2022-12-18 LAB — CBC WITH DIFFERENTIAL/PLATELET
Abs Immature Granulocytes: 0.23 10*3/uL — ABNORMAL HIGH (ref 0.00–0.07)
Basophils Absolute: 0 10*3/uL (ref 0.0–0.1)
Basophils Relative: 0 %
Eosinophils Absolute: 0 10*3/uL (ref 0.0–0.5)
Eosinophils Relative: 0 %
HCT: 29.3 % — ABNORMAL LOW (ref 39.0–52.0)
Hemoglobin: 9.4 g/dL — ABNORMAL LOW (ref 13.0–17.0)
Immature Granulocytes: 3 %
Lymphocytes Relative: 5 %
Lymphs Abs: 0.4 10*3/uL — ABNORMAL LOW (ref 0.7–4.0)
MCH: 28.4 pg (ref 26.0–34.0)
MCHC: 32.1 g/dL (ref 30.0–36.0)
MCV: 88.5 fL (ref 80.0–100.0)
Monocytes Absolute: 0.6 10*3/uL (ref 0.1–1.0)
Monocytes Relative: 8 %
Neutro Abs: 6.3 10*3/uL (ref 1.7–7.7)
Neutrophils Relative %: 84 %
Platelets: 69 10*3/uL — ABNORMAL LOW (ref 150–400)
RBC: 3.31 MIL/uL — ABNORMAL LOW (ref 4.22–5.81)
RDW: 13.8 % (ref 11.5–15.5)
WBC: 7.5 10*3/uL (ref 4.0–10.5)
nRBC: 0 % (ref 0.0–0.2)

## 2022-12-18 LAB — GLUCOSE, CAPILLARY
Glucose-Capillary: 129 mg/dL — ABNORMAL HIGH (ref 70–99)
Glucose-Capillary: 133 mg/dL — ABNORMAL HIGH (ref 70–99)
Glucose-Capillary: 230 mg/dL — ABNORMAL HIGH (ref 70–99)
Glucose-Capillary: 280 mg/dL — ABNORMAL HIGH (ref 70–99)

## 2022-12-18 LAB — MAGNESIUM: Magnesium: 2.1 mg/dL (ref 1.7–2.4)

## 2022-12-18 LAB — CYTOLOGY - NON PAP

## 2022-12-18 MED ORDER — BISACODYL 10 MG RE SUPP
10.0000 mg | Freq: Once | RECTAL | Status: DC
  Filled 2022-12-18: qty 1

## 2022-12-18 MED ORDER — SENNOSIDES-DOCUSATE SODIUM 8.6-50 MG PO TABS
1.0000 | ORAL_TABLET | Freq: Every day | ORAL | Status: DC
  Filled 2022-12-18 (×2): qty 1

## 2022-12-18 MED ORDER — POLYETHYLENE GLYCOL 3350 17 G PO PACK: 17.0000 g | PACK | Freq: Every day | ORAL | Status: DC | PRN

## 2022-12-18 MED ORDER — SENNOSIDES-DOCUSATE SODIUM 8.6-50 MG PO TABS
1.0000 | ORAL_TABLET | Freq: Two times a day (BID) | ORAL | Status: DC
  Filled 2022-12-18: qty 1

## 2022-12-18 MED ORDER — IPRATROPIUM-ALBUTEROL 0.5-2.5 (3) MG/3ML IN SOLN: 3.0000 mL | RESPIRATORY_TRACT | Status: DC | PRN

## 2022-12-18 MED ORDER — POLYETHYLENE GLYCOL 3350 17 G PO PACK
17.0000 g | PACK | Freq: Every day | ORAL | Status: DC
  Filled 2022-12-18: qty 1

## 2022-12-18 NOTE — Progress Notes (Signed)
Assumed care of patient at 1500 from Rico Sheehan, RN. Agree with previously documented assessment. Will continue current plan of care. ?

## 2022-12-18 NOTE — Progress Notes (Signed)
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 pseudocyst in 08/2013. Follwed by Dr Rob Bunting and Rise Mu (at [facility]).     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    Hypertension    No meds yet.   Liver abscess 01/2014   Pneumonia    10-30-14 [facility] hospital admission, no problems now.   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    Thrombocytopenia (HCC) 2015   with splenomegaly 03/2014.  s/p bone marrow biopsy, nonspecific findings 09/2015   Past Surgical History:  Procedure Laterality Date   APPENDECTOMY     BIOPSY  05/11/2022   Procedure: BIOPSY;  Surgeon: Jenel Lucks, MD;  Location: [facility] ENDOSCOPY;  Service: Gastroenterology;;   COLONOSCOPY WITH PROPOFOL N/A 01/05/2015   Rachael Fee, MD;  Thre 7 to 13 mm tubular adenomatous polyps removed. repeat colonoscopy suggested in 12/2017.     ENDOSCOPIC RETROGRADE CHOLANGIOPANCREATOGRAPHY (ERCP) WITH PROPOFOL N/A 06/02/2014   Dr Rob Bunting.  removed covered metal stent.  no stricture noted.  Rec: biliary diversion surgery if recurrent stricture arises.   ERCP N/A 02/22/2014   Meryl Dare, MD; with removal of plastic stent, replaced with metal biliary stent.  filling defect: stone, sludge, blood clots removed with baloon sweep.     ERCP N/A 02/18/2014    For suspected cholangitis. Louis Meckel, MD; s/p sphincterotomy, blood clots and sludge swept out, remnant of pancreatic duct noted (removal not mentioned.  plastic stent placed into CBD.     ERCP, plastic pancreatic duct stent placement.  2007   Dr Danny Lawless at [facility].    ESOPHAGEAL BANDING  05/11/2022   Procedure: ESOPHAGEAL BANDING;  Surgeon: Jenel Lucks, MD;  Location: [facility] ENDOSCOPY;  Service: Gastroenterology;;   ESOPHAGOGASTRODUODENOSCOPY N/A 05/11/2022   Procedure: ESOPHAGOGASTRODUODENOSCOPY (EGD);  Surgeon: Jenel Lucks, MD;  Location: [facility] ENDOSCOPY;  Service: Gastroenterology;  Laterality: N/A;   ESOPHAGOGASTRODUODENOSCOPY (EGD) WITH PROPOFOL N/A 09/14/2013   Dr Rhea Belton.  erosive gastritis.  bulbar ulcer.  removal of plastic biliaty stent.     ESOPHAGOGASTRODUODENOSCOPY (EGD) WITH PROPOFOL N/A 01/05/2015   for melena. Rachael Fee.  Large distal esophageal varices, no bleeding stigmata.  no gastric varices, not banded.  Moderate to severe portal hypertensive gastropathy.  Nadolol initiated.    ESOPHAGOGASTRODUODENOSCOPY (EGD) WITH PROPOFOL N/A 08/19/2016   Armbruster, Reeves Forth, MD; Large esophageal varices with red whale signs, high risk for bleeding. 11 bands placed.  Portal hypertensive gastropathy.    FLEXIBLE SIGMOIDOSCOPY N/A 05/11/2022   Procedure: FLEXIBLE SIGMOIDOSCOPY;  Surgeon: Jenel Lucks, MD;  Location: [facility] ENDOSCOPY;  Service: Gastroenterology;  Laterality: N/A;   GASTROINTESTINAL STENT REMOVAL N/A 09/14/2013   Beverley Fiedler, MD; Pancreatic stent removal      Allergies: Patient has no known allergies.  Medications: Prior to Admission medications   Medication  Sig Start Date End Date Taking? Authorizing Provider  dicyclomine (BENTYL) 20 MG tablet Take 20 mg by mouth in the morning and at bedtime.    [provider]  FLUoxetine (PROZAC) 10 MG tablet Take 10 mg by mouth daily. 12/07/22   [provider]   HYDROcodone-acetaminophen (NORCO) 7.5-325 MG tablet Take 1 tablet by mouth every 6 (six) hours as needed for moderate pain.    [provider]  metFORMIN (GLUCOPHAGE) 1000 MG tablet Take 1 tablet by mouth daily with breakfast. 10/07/22   [provider]  methocarbamol (ROBAXIN) 500 MG tablet Take 500 mg by mouth 3 (three) times daily. 09/23/22   [provider]  nicotine (NICODERM CQ - DOSED IN MG/24 HOURS) 21 mg/24hr patch Place 1 patch (21 mg total) onto the skin daily. 05/12/22   Almon Hercules, MD  pantoprazole (PROTONIX) 40 MG tablet Take 1 tablet (40 mg total) by mouth 2 (two) times daily for 30 days, THEN 1 tablet (40 mg total) daily. 05/12/22 12/12/22  Almon Hercules, MD  polyethylene glycol powder (MIRALAX) 17 GM/SCOOP powder Take 17 g by mouth 2 (two) times daily as needed for moderate constipation or mild constipation. 05/12/22   Almon Hercules, MD  propranolol (INDERAL) 20 MG tablet Take 1 tablet (20 mg total) by mouth 2 (two) times daily. 05/12/22 12/12/22  Almon Hercules, MD  spironolactone (ALDACTONE) 50 MG tablet Take 50 mg by mouth daily. 11/06/22   [provider]  SYMBICORT 160-4.5 MCG/ACT inhaler Inhale 2 puffs into the lungs 2 (two) times daily. 11/11/22   [provider]  triamcinolone cream (KENALOG) 0.5 % Apply topically. 08/29/22   [provider]     Vital Signs: BP 115/86 (BP Location: Right Arm)   Pulse 77   Temp 98 F (36.7 C) (Oral)   Resp 16   Ht 5\' 8"  (1.727 m)   Wt 134 lb 4.2 oz (60.9 kg)   SpO2 96%   BMI 20.41 kg/m   Physical Exam: Patient awake, alert.  Chest with slightly diminished breath sounds bases; heart with regular rate and rhythm.  Abdomen soft, positive bowel sounds, some mild right upper quadrant tenderness to palpation.  Hepatic abscess drain intact  right flank area; bandage intact,   site mildly tender to palpation, output about 5-10 cc of turbid light brown fluid.  Imaging: CT ABDOMEN PELVIS W  CONTRAST  Result Date: 12/17/2022 CLINICAL DATA:  Intraabdominal abscess EXAM: CT ABDOMEN AND PELVIS WITH CONTRAST TECHNIQUE: Multidetector CT imaging of the abdomen and pelvis was performed using the standard protocol following bolus administration of intravenous contrast. RADIATION DOSE REDUCTION: This exam was performed according to the departmental dose-optimization program which includes automated exposure control, adjustment of the mA and/or kV according to patient size and/or use of iterative reconstruction technique. CONTRAST:  OMNIPAQUE IOHEXOL 300 MG/ML  SOLN COMPARISON:  CT abdomen and pelvis 12/12/2022 FINDINGS: Lower chest: There are small bilateral pleural effusions, right greater than left, which are new from prior. There is new atelectasis in the bilateral lung bases. Hepatobiliary: Heterogeneous multi-septated peripherally enhancing low-attenuation area in the liver is again seen. There is a new percutaneous drainage catheter in the inferior portion. Overall size has increased now measuring 8.0 x 4.9 x 11.0 cm (previously 4.5 x 3.4 x 7.6 cm). Note is made of a new linear tract measuring 5.5 cm in length containing air and fluid likely secondary to needle/procedure located anterior to this lesion. There is stable  intrahepatic biliary ductal dilatation. The gallbladder is unremarkable. Pancreas: There is diffuse pancreatic atrophy, ductal dilatation in calcifications compatible with chronic pancreatitis. Pancreatic head stent is unchanged in position. Spleen: The spleen is moderately enlarged and unchanged in size. Diffuse heterogeneity of the spleen may be related to phase of contrast. Adrenals/Urinary Tract: There is no hydronephrosis or perinephric stranding. Bilateral renal cysts are unchanged. Bladder and adrenal glands are within normal limits. Stomach/Bowel: Stomach is within normal limits. No evidence of bowel wall thickening, distention, or inflammatory changes. The appendix is not  seen. There is a large amount of stool throughout the colon. Vascular/Lymphatic: Aorta and IVC are normal in size. 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 No lymphadenopathy identified. Reproductive: Prostate is unremarkable. Other: There small fat containing inguinal hernias. There is new small amount of ascites in the abdomen and pelvis. There is new body wall edema. Musculoskeletal: No fracture is seen. IMPRESSION: 1. Interval placement of percutaneous drainage catheter in the inferior portion of the liver abscess. Overall size of the abscess has increased in size. 2. New linear tract containing air and fluid anterior to the abscess likely secondary to needle/procedure. 3. New small bilateral pleural effusions with bibasilar atelectasis. 4. New small amount of ascites and body wall edema. 5. Stable findings of chronic pancreatitis and pancreatic stent. 6. Stable splenomegaly. 7. 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 8. Large amount of stool throughout the colon. Electronically Signed   By: Darliss Cheney M.D.   On: 12/17/2022 16:53    Labs:  CBC: Recent Labs    12/15/22 0302 12/16/22 0457 12/17/22 0916 12/18/22 0519  WBC 9.6 9.5 14.5* 7.5  HGB 10.8* 10.3* 9.9* 9.4*  HCT 32.7* 31.7* 29.6* 29.3*  PLT 58* 55* 78* 69*    COAGS: Recent Labs    05/10/22 0357 05/12/22 0428 05/13/22 0442 12/13/22 0608  INR 1.2 1.2 1.3* 1.4*  APTT 28  --  41*  --     BMP: Recent Labs    12/15/22 0302 12/16/22 0457 12/17/22 0916 12/18/22 0519  NA 137 135 130* 135  K 3.7 3.7 3.2* 3.8  CL 109 102 98 105  CO2 24 25 21* 23  GLUCOSE 264* 146* 135* 150*  BUN 15 15 10 8   CALCIUM 7.2* 7.6* 7.5* 7.5*  CREATININE 0.81 0.80 0.70 0.69  GFRNONAA >60 >60 >60 >60    LIVER FUNCTION TESTS: Recent Labs    12/15/22 0302 12/16/22 0457 12/17/22 0916 12/18/22 0519   BILITOT 0.9 1.2 1.3* 1.1  AST 44* 23 20 15   ALT 51* 38 29 23  ALKPHOS 306* 278* 320* 275*  PROT 5.6* 5.7* 6.2* 5.8*  ALBUMIN 1.8* 1.8* 2.0* 1.8*    Assessment and Plan: 52 year old male with a complex history of cholangitis, choledocholithiasis, biliary stricture with stent placement, chronic pancreatitis 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, 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 Interval placement of percutaneous drainage catheter in the inferior portion of the liver abscess. Overall size of the abscess has increased in size. 2. New linear tract containing air and fluid anterior to the abscess likely secondary to needle/procedure. 3. New small bilateral pleural effusions with bibasilar atelectasis. 4. New small amount of ascites and body wall edema. 5. Stable findings of chronic pancreatitis and pancreatic stent. 6. Stable splenomegaly. 7. 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 8. Large amount of stool throughout the colon.  Images were reviewed today by Dr. Bryn Gulling; plan at this time is for image guided hepatic drain exchange/manipulation/upsize/possible additional hepatic drain placement on 9/26; details/risks of procedure, including but not limited to, internal bleeding, infection, injury to adjacent structures discussed  with patient with his understanding and consent.   Electronically Signed: D. Jeananne [name], PA-C 12/18/2022, 11:48 AM   I spent a total of 15 minutes at the the patient's bedside AND on the patient's hospital floor or unit, greater than 50% of which was counseling/coordinating care for hepatic abscess drain    Patient ID: Lawrence Brock, male   DOB: [birth year], 52 y.o.   MRN: [id number]

## 2022-12-18 NOTE — Progress Notes (Addendum)
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  Found to have multifocal hepatic abscess treated with drainage and antibiotic. Due to persistent fever repeat CT abdomen performed 9/24 showed evidence of increase in the size of the abscess.   Assessment & Plan:   Principal Problem:   Sepsis (HCC) Active Problems:   Thrombocytopenia, unspecified (HCC)   Tobacco abuse   Hepatic abscess   Common biliary duct stricture   Pancytopenia (HCC)   Portal vein thrombosis   Splenomegaly   Chronic obstructive pulmonary disease (HCC)   Gastroesophageal reflux disease   Type 2 diabetes mellitus without complication, without long-term current use of insulin (HCC)   Constipation  #1 sepsis secondary to multifocal hepatic abscesses, POA -Patient met criteria on admission for sepsis with fever, tachycardia, hypotension, CT scan findings concerning for multifocal abscesses. -Patient had been initially admitted to the stepdown unit treated with IV fluids and IV antibiotics with improvement with hypotension. -Patient initially on IV Zosyn and subsequently transitioned to IV Unasyn. -Patient seen in consultation by general surgery who feel patient is not a candidate at this time for surgical intervention and recommended evaluation by IR for drain placement and IV antibiotics. -Patient also seen in consultation by GI, who recommended surgery and IR input due to patient's complex hepatobiliary with concerns for abscess formation. -Patient subsequently underwent CT-guided CT-guided drain placement on  12/14/2022 with preliminary cultures growing Streptococcus constellatus -Repeat CT abdomen and pelvis done 12/17/2022 with evidence of increasing/worsening abscess size. -Patient reassessed by IR who recommended image guided hepatic drain exchange/manipulation/upsize/possible additional hepatic drain placement to be done on 12/19/2022. -Make patient n.p.o. after midnight. -Hold Lovenox in the AM. -Will consult with ID for antibiotic recommendations and duration.  2.  Type 2 diabetes mellitus, without complication, without long-term insulin use -Hemoglobin A1c noted at 8.4. -CBG of 129 this morning. -Hold home regimen metformin. -SSI.  3.  GERD -PPI.  4.  COPD -Stable -Continue Dulera. -Place on DuoNebs as needed.  5.  Constipation -Patient noted with large stool burden on CT abdomen and pelvis. -Bowel regimen ordered this morning however patient refusing as per RN states having multiple bowel movements. -Change Senokot S to nightly. -Change MiraLAX to daily as needed.  6.  Anemia/chronic thrombocytopenia -Stable. -Patient with no overt bleeding.  7.  Mood disorder -Continue home regimen Prozac,  8.  Tobacco abuse -Tobacco cessation.   DVT prophylaxis: Lovenox Code Status: Full Family Communication: Updated patient.  No family at bedside. Disposition: TBD  Status is: Inpatient Remains inpatient appropriate because: Severity of illness   Consultants:  Gastroenterology: Dr. Elnoria Howard 12/12/2022 General Surgery: Dr. Carolynne Edouard III 12/13/2022  Procedures:  CT abdomen and pelvis 12/12/2022, 12/17/2022 Abdominal ultrasound 12/12/2022 CT-guided placement of 10 French all-purpose drainage catheter into right hepatic fluid collection with aspiration of 15 mL of sanguinous, purulent fluid: Per IR: Dr. Elby Showers 12/14/2022  Antimicrobials:  Anti-infectives (From admission, onward)    Start     Dose/Rate Route Frequency Ordered Stop   12/17/22 1800  Ampicillin-Sulbactam (UNASYN) 3 g in sodium  chloride 0.9 % 100 mL IVPB  3 g 200 mL/hr over 30 Minutes Intravenous Every 6 hours 12/17/22 0846     12/16/22 1700  amoxicillin-clavulanate (AUGMENTIN) 875-125 MG per tablet 1 tablet  Status:  Discontinued        1 tablet Oral Every 12 hours 12/16/22 1610 12/17/22 0833   12/12/22 1600  piperacillin-tazobactam (ZOSYN) IVPB 3.375 g  Status:  Discontinued        3.375 g 12.5 mL/hr over 240 Minutes Intravenous Every 8 hours 12/12/22 1304 12/16/22 1610   12/12/22 0745  piperacillin-tazobactam (ZOSYN) IVPB 3.375 g        3.375 g 100 mL/hr over 30 Minutes Intravenous  Once 12/12/22 0737 12/12/22 1154         Subjective: Standing in room.  States had low-grade temp of 100.6 overnight.  Noted to have significant right upper quadrant pain early on this morning currently controlled on current pain regimen.  Denies any nausea or vomiting.  No chest pain.  Had a little bit of pleuritic pain on the right side which has since resolved this morning.  Asking whether he can eat postprocedure.  Objective: Vitals:   12/18/22 0020 12/18/22 0418 12/18/22 0812 12/18/22 1354  BP:  115/86  106/73  Pulse:  77  93  Resp: 18 16  16   Temp:  98 F (36.7 C)  98.4 F (36.9 C)  TempSrc:  Oral  Oral  SpO2:  97% 96% 97%  Weight:      Height:        Intake/Output Summary (Last 24 hours) at 12/18/2022 1901 Last data filed at 12/18/2022 1800 Gross per 24 hour  Intake 881.36 ml  Output 825 ml  Net 56.36 ml   Filed Weights   12/12/22 0010 12/14/22 1655  Weight: 54.4 kg 60.9 kg    Examination:  General exam: Appears calm and comfortable  Respiratory system: Clear to auscultation. Respiratory effort normal. Cardiovascular system: S1 & S2 heard, RRR. No JVD, murmurs, rubs, gallops or clicks. No pedal edema. Gastrointestinal system: Abdomen is nondistended, soft and nontender. No organomegaly or masses felt. Normal bowel sounds heard.  Right hepatic drain noted with serosanguineous fluid. Central  nervous system: Alert and oriented. No focal neurological deficits. Extremities: Symmetric 5 x 5 power. Skin: No rashes, lesions or ulcers Psychiatry: Judgement and insight appear normal. Mood & affect appropriate.     Data Reviewed: I have personally reviewed following labs and imaging studies  CBC: Recent Labs  Lab 12/12/22 0024 12/12/22 1151 12/13/22 0608 12/15/22 0302 12/16/22 0457 12/17/22 0916 12/18/22 0519  WBC 16.1*   < > 14.4* 9.6 9.5 14.5* 7.5  NEUTROABS 14.7*  --   --  8.8* 8.5* 13.0* 6.3  HGB 11.7*   < > 12.2* 10.8* 10.3* 9.9* 9.4*  HCT 34.3*   < > 36.7* 32.7* 31.7* 29.6* 29.3*  MCV 86.0   < > 87.6 89.1 89.0 85.3 88.5  PLT 105*   < > 68* 58* 55* 78* 69*   < > = values in this interval not displayed.    Basic Metabolic Panel: Recent Labs  Lab 12/12/22 1151 12/13/22 0608 12/15/22 0302 12/16/22 0457 12/17/22 0916 12/18/22 0519  NA  --  135 137 135 130* 135  K  --  4.1 3.7 3.7 3.2* 3.8  CL  --  102 109 102 98 105  CO2  --  23 24 25  21* 23  GLUCOSE  --  161* 264* 146* 135* 150*  BUN  --  17 15 15  10 8  CREATININE 0.86 0.86 0.81 0.80 0.70 0.69  CALCIUM  --  7.8* 7.2* 7.6* 7.5* 7.5*  MG 1.7  --   --   --  1.9 2.1    GFR: Estimated Creatinine Clearance: 94.1 mL/min (by C-G formula based on SCr of 0.69 mg/dL).  Liver Function Tests: Recent Labs  Lab 12/13/22 0608 12/15/22 0302 12/16/22 0457 12/17/22 0916 12/18/22 0519  AST 46* 44* 23 20 15   ALT 48* 51* 38 29 23  ALKPHOS 308* 306* 278* 320* 275*  BILITOT 2.0* 0.9 1.2 1.3* 1.1  PROT 6.6 5.6* 5.7* 6.2* 5.8*  ALBUMIN 2.4* 1.8* 1.8* 2.0* 1.8*    CBG: Recent Labs  Lab 12/17/22 1634 12/17/22 2025 12/18/22 0732 12/18/22 1155 12/18/22 1710  GLUCAP 123* 137* 129* 133* 230*     Recent Results (from the past 240 hour(s))  Blood culture (routine x 2)     Status: None (Preliminary result)   Collection Time: 12/12/22  8:04 AM   Specimen: BLOOD  Result Value Ref Range Status   Specimen  Description   Final    BLOOD SITE NOT SPECIFIED Performed at [facility], [address], [place], [place] [address]    Special Requests   Final    BOTTLES DRAWN AEROBIC AND ANAEROBIC Blood Culture adequate volume Performed at [facility], [address], [place], [place] [address]    Culture  Setup Time   Final    GRAM POSITIVE RODS ANAEROBIC BOTTLE ONLY CRITICAL RESULT CALLED TO, READ BACK BY AND VERIFIED WITH: PHARMD ELLEN JACKSON ON 12/13/22 @ 2315 BY DRT    Culture   Final    CULTURE REINCUBATED FOR BETTER GROWTH Performed at [facility], [address], [place], [place] [address]    Report Status PENDING  Incomplete  Blood culture (routine x 2)     Status: None   Collection Time: 12/12/22  8:04 AM   Specimen: BLOOD  Result Value Ref Range Status   Specimen Description   Final    BLOOD SITE NOT SPECIFIED Performed at [facility], [address], [place], [place] [address]    Special Requests   Final    BOTTLES DRAWN AEROBIC AND ANAEROBIC Blood Culture adequate volume Performed at [facility], [address], [place], [place] [address]    Culture   Final    NO GROWTH 5 DAYS Performed at [facility], [address], [place], [place] [address]    Report Status 12/17/2022 FINAL  Final  SARS Coronavirus 2 by RT PCR (hospital order, performed in [facility] hospital lab) *cepheid single result test* Anterior Nasal Swab     Status: None   Collection Time: 12/12/22  8:32 AM   Specimen: Anterior Nasal Swab  Result Value Ref Range Status   SARS Coronavirus 2 by RT PCR NEGATIVE NEGATIVE Final    Comment: (NOTE) SARS-CoV-2 target nucleic acids are NOT DETECTED.  The SARS-CoV-2 RNA is generally detectable in upper and lower respiratory specimens during the acute phase of infection. The lowest concentration of SARS-CoV-2 viral copies this assay can detect is 250 copies / mL. A negative result does not  preclude SARS-CoV-2 infection and should not be used as the sole basis for treatment or other patient management decisions.  A negative result may occur with improper specimen collection / handling, submission of specimen other than nasopharyngeal swab, presence of viral mutation(s) within the areas targeted by  this assay, and inadequate number of viral copies (<250 copies / mL). A negative result must be combined with clinical observations, patient history, and epidemiological information.  Fact Sheet for Patients:   RoadLapTop.co.za  Fact Sheet for Healthcare Providers: http://kim-miller.com/  This test is not yet approved or  cleared by the [place] FDA and has been authorized for detection and/or diagnosis of SARS-CoV-2 by FDA under an Emergency Use Authorization (EUA).  This EUA will remain in effect (meaning this test can be used) for the duration of the COVID-19 declaration under Section 564(b)(1) of the Act, 21 U.S.C. section 360bbb-3(b)(1), unless the authorization is terminated or revoked sooner.  Performed at [facility], [address], [place], [place] [address]   Aerobic/Anaerobic Culture w Gram Stain (surgical/deep wound)     Status: None (Preliminary result)   Collection Time: 12/14/22 10:17 AM   Specimen: Liver; Abscess  Result Value Ref Range Status   Specimen Description   Final    LIVER ABSCESS Performed at [facility], [address], [place], [place] [address]    Special Requests   Final    NONE Performed at [facility], [address], [place], [place] [address]    Gram Stain   Final    NO WBC SEEN NO ORGANISMS SEEN Performed at [facility], [address], [place], [place] [address]    Culture   Final    FEW STREPTOCOCCUS CONSTELLATUS Beta hemolytic streptococci are predictably susceptible to penicillin and other beta lactams.  Susceptibility testing not routinely performed. NO ANAEROBES ISOLATED; CULTURE IN PROGRESS FOR 5 DAYS    Report Status PENDING  Incomplete  MRSA Next Gen by PCR, Nasal     Status: None   Collection Time: 12/14/22  5:08 PM   Specimen: Nasal Mucosa; Nasal Swab  Result Value Ref Range Status   MRSA by PCR Next Gen NOT DETECTED NOT DETECTED Final    Comment: (NOTE) The GeneXpert MRSA Assay (FDA approved for NASAL specimens only), is one component of a comprehensive MRSA colonization surveillance program. It is not intended to diagnose MRSA infection nor to guide or monitor treatment for MRSA infections. Test performance is not FDA approved in patients less than 5 years old. Performed at [facility], [address] [place], [place] [address]   Culture, blood (Routine X 2) w Reflex to ID Panel     Status: None (Preliminary result)   Collection Time: 12/17/22  9:16 AM   Specimen: BLOOD  Result Value Ref Range Status   Specimen Description   Final    BLOOD SITE NOT SPECIFIED Performed at [facility], [address] [place], [place] [address]    Special Requests   Final    BOTTLES DRAWN AEROBIC AND ANAEROBIC Blood Culture adequate volume Performed at [facility], [address] [place], [place] [address]    Culture   Final    NO GROWTH < 24 HOURS Performed at [facility], [address], [place], [place] [address]    Report Status PENDING  Incomplete  Culture, blood (Routine X 2) w Reflex to ID Panel     Status: None (Preliminary result)   Collection Time: 12/17/22  9:16 AM   Specimen: BLOOD  Result Value Ref Range Status   Specimen Description   Final    BLOOD SITE NOT SPECIFIED Performed at [facility], [address] [place], [place]  [address]    Special Requests   Final    BOTTLES DRAWN AEROBIC AND ANAEROBIC Blood Culture adequate volume Performed at [facility], [address], [place], [place] [address]    Culture   Final    NO GROWTH < 24 HOURS Performed at [facility], [address], [place], [place] [address]    Report Status PENDING  Incomplete         Radiology Studies: CT ABDOMEN PELVIS W CONTRAST  Result Date: 12/17/2022 CLINICAL DATA:  Intraabdominal abscess EXAM: CT ABDOMEN AND PELVIS WITH CONTRAST TECHNIQUE: Multidetector CT imaging of the abdomen and pelvis was performed using the standard protocol following bolus administration of intravenous contrast. RADIATION DOSE REDUCTION: This exam was performed according to the departmental dose-optimization program which includes automated exposure control, adjustment of the mA and/or kV according to patient size and/or use of iterative reconstruction technique. CONTRAST:  OMNIPAQUE IOHEXOL 300 MG/ML  SOLN COMPARISON:  CT abdomen and pelvis 12/12/2022 FINDINGS: Lower chest: There are small bilateral pleural effusions, right greater than left, which are new from prior. There is new atelectasis in the bilateral lung bases. Hepatobiliary: Heterogeneous multi-septated peripherally enhancing low-attenuation area in the liver is again seen. There is a new percutaneous drainage catheter in the inferior portion. Overall size has increased now measuring 8.0 x 4.9 x 11.0 cm (previously 4.5 x 3.4 x 7.6 cm). Note is made of a new linear tract measuring 5.5 cm in length containing air and fluid likely secondary to needle/procedure located anterior to this lesion. There is stable intrahepatic biliary ductal dilatation. The gallbladder is unremarkable. Pancreas: There is diffuse pancreatic atrophy, ductal dilatation in calcifications compatible with chronic pancreatitis. Pancreatic head stent is unchanged in position. Spleen: The spleen is moderately enlarged and unchanged in size. Diffuse heterogeneity of the spleen may be related to phase of contrast. Adrenals/Urinary Tract: There is no hydronephrosis or  perinephric stranding. Bilateral renal cysts are unchanged. Bladder and adrenal glands are within normal limits. Stomach/Bowel: Stomach is within normal limits. No evidence of bowel wall thickening, distention, or inflammatory changes. The appendix is not seen. There is a large amount of stool throughout the colon. Vascular/Lymphatic: Aorta and IVC are normal in size. 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 No lymphadenopathy identified. Reproductive: Prostate is unremarkable. Other: There small fat containing inguinal hernias. There is new small amount of ascites in the abdomen and pelvis. There is new body wall edema. Musculoskeletal: No fracture is seen. IMPRESSION: 1. Interval placement of percutaneous drainage catheter in the inferior portion of the liver abscess. Overall size of the abscess has increased in size. 2. New linear tract containing air and fluid anterior to the abscess likely secondary to needle/procedure. 3. New small bilateral pleural effusions with bibasilar atelectasis. 4. New small amount of ascites and body wall edema. 5. Stable findings of chronic pancreatitis and pancreatic stent. 6. Stable splenomegaly. 7. 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 8. Large amount of stool throughout the colon. Electronically Signed   By: Darliss Cheney M.D.   On: 12/17/2022 16:53        Scheduled Meds:  bisacodyl  10 mg Rectal Once   enoxaparin (LOVENOX) injection  40 mg Subcutaneous Daily   FLUoxetine  10 mg Oral Daily   insulin aspart  0-15 Units Subcutaneous TID WC   insulin aspart  0-5 Units Subcutaneous  QHS   mometasone-formoterol  2 puff Inhalation BID   nicotine  14 mg Transdermal Daily   pantoprazole  40 mg Oral QHS   [START ON 12/19/2022] senna-docusate  1 tablet Oral QHS   sodium chloride flush  5 mL Intracatheter Q8H   Continuous Infusions:   ampicillin-sulbactam (UNASYN) IV 3 g (12/18/22 1717)     LOS: 6 days    Time spent: 40 minutes    Ramiro Harvest, MD [facility]   To contact the attending provider between 7A-7P or the covering provider during after hours 7P-7A, please log into the web site [email or web address] and access using universal [place] password for that web site. If you do not have the password, please call the hospital operator.  12/18/2022, 7:01 PM

## 2022-12-19 ENCOUNTER — Inpatient Hospital Stay (HOSPITAL_COMMUNITY): Payer: BLUE CROSS/BLUE SHIELD

## 2022-12-19 DIAGNOSIS — J449 Chronic obstructive pulmonary disease, unspecified: Secondary | ICD-10-CM | POA: Diagnosis not present

## 2022-12-19 DIAGNOSIS — D696 Thrombocytopenia, unspecified: Secondary | ICD-10-CM | POA: Diagnosis not present

## 2022-12-19 DIAGNOSIS — K750 Abscess of liver: Secondary | ICD-10-CM | POA: Diagnosis not present

## 2022-12-19 DIAGNOSIS — A419 Sepsis, unspecified organism: Secondary | ICD-10-CM | POA: Diagnosis not present

## 2022-12-19 HISTORY — PX: IR CATHETER TUBE CHANGE: IMG717

## 2022-12-19 LAB — CBC WITH DIFFERENTIAL/PLATELET
Abs Immature Granulocytes: 0.19 10*3/uL — ABNORMAL HIGH (ref 0.00–0.07)
Basophils Absolute: 0 10*3/uL (ref 0.0–0.1)
Basophils Relative: 0 %
Eosinophils Absolute: 0 10*3/uL (ref 0.0–0.5)
Eosinophils Relative: 0 %
HCT: 29.5 % — ABNORMAL LOW (ref 39.0–52.0)
Hemoglobin: 9.6 g/dL — ABNORMAL LOW (ref 13.0–17.0)
Immature Granulocytes: 3 %
Lymphocytes Relative: 5 %
Lymphs Abs: 0.3 10*3/uL — ABNORMAL LOW (ref 0.7–4.0)
MCH: 28.7 pg (ref 26.0–34.0)
MCHC: 32.5 g/dL (ref 30.0–36.0)
MCV: 88.3 fL (ref 80.0–100.0)
Monocytes Absolute: 0.3 10*3/uL (ref 0.1–1.0)
Monocytes Relative: 5 %
Neutro Abs: 4.9 10*3/uL (ref 1.7–7.7)
Neutrophils Relative %: 87 %
Platelets: 75 10*3/uL — ABNORMAL LOW (ref 150–400)
RBC: 3.34 MIL/uL — ABNORMAL LOW (ref 4.22–5.81)
RDW: 13.8 % (ref 11.5–15.5)
WBC: 5.7 10*3/uL (ref 4.0–10.5)
nRBC: 0 % (ref 0.0–0.2)

## 2022-12-19 LAB — COMPREHENSIVE METABOLIC PANEL
ALT: 21 U/L (ref 0–44)
AST: 14 U/L — ABNORMAL LOW (ref 15–41)
Albumin: 1.9 g/dL — ABNORMAL LOW (ref 3.5–5.0)
Alkaline Phosphatase: 276 U/L — ABNORMAL HIGH (ref 38–126)
Anion gap: 4 — ABNORMAL LOW (ref 5–15)
BUN: 10 mg/dL (ref 6–20)
CO2: 26 mmol/L (ref 22–32)
Calcium: 7.9 mg/dL — ABNORMAL LOW (ref 8.9–10.3)
Chloride: 105 mmol/L (ref 98–111)
Creatinine, Ser: 0.77 mg/dL (ref 0.61–1.24)
GFR, Estimated: >60 mL/min (ref >60)
Glucose, Bld: 201 mg/dL — ABNORMAL HIGH (ref 70–99)
Potassium: 4.4 mmol/L (ref 3.5–5.1)
Sodium: 135 mmol/L (ref 135–145)
Total Bilirubin: 0.8 mg/dL (ref 0.3–1.2)
Total Protein: 6.4 g/dL — ABNORMAL LOW (ref 6.5–8.1)

## 2022-12-19 LAB — PROTIME-INR
INR: 1.2 (ref 0.8–1.2)
Prothrombin Time: 15.1 seconds (ref 11.4–15.2)

## 2022-12-19 LAB — CULTURE, BLOOD (ROUTINE X 2)
Special Requests: ADEQUATE
Special Requests: ADEQUATE

## 2022-12-19 LAB — GLUCOSE, CAPILLARY
Glucose-Capillary: 169 mg/dL — ABNORMAL HIGH (ref 70–99)
Glucose-Capillary: 134 mg/dL — ABNORMAL HIGH (ref 70–99)
Glucose-Capillary: 114 mg/dL — ABNORMAL HIGH (ref 70–99)
Glucose-Capillary: 226 mg/dL — ABNORMAL HIGH (ref 70–99)

## 2022-12-19 LAB — AEROBIC/ANAEROBIC CULTURE W GRAM STAIN (SURGICAL/DEEP WOUND): Gram Stain: NONE SEEN

## 2022-12-19 LAB — MAGNESIUM: Magnesium: 2 mg/dL (ref 1.7–2.4)

## 2022-12-19 MED ORDER — LIDOCAINE HCL 1 % IJ SOLN
10.0000 mL | Freq: Once | INTRAMUSCULAR | Status: AC
  Administered 2022-12-19: 8 mL via INTRADERMAL
  Filled 2022-12-19: qty 10

## 2022-12-19 MED ORDER — LIDOCAINE HCL 1 % IJ SOLN
INTRAMUSCULAR | Status: AC
  Filled 2022-12-19: qty 20

## 2022-12-19 MED ORDER — IOHEXOL 300 MG/ML  SOLN
50.0000 mL | Freq: Once | INTRAMUSCULAR | Status: AC | PRN
  Administered 2022-12-19: 5 mL

## 2022-12-19 NOTE — Consult Note (Signed)
[facility]    Date of Admission:  12/11/2022   Total days of inpatient antibiotics 6        Reason for Consult: Intraabdominal abscess    Principal Problem:   Sepsis (HCC) Active Problems:   Thrombocytopenia, unspecified (HCC)   Hepatic abscess   Common biliary duct stricture   Pancytopenia (HCC)   Portal vein thrombosis   Tobacco abuse   Splenomegaly   Chronic obstructive pulmonary disease (HCC)   Gastroesophageal reflux disease   Type 2 diabetes mellitus without complication, without long-term current use of insulin (HCC)   Constipation   Assessment: History of alcoholic cirrhosis with prior Hx of liver abscess  treated with augmentin x 9month->cipro+ flagyl (length?) in 2015-2016 admitted with: #Enlarging liver abscess #Liver abscess status post drain placement cultures growing strep constellatus on 9/21 - Presented 4 to 5 days of abdominal pain.  Patient states he was taking medication as he started a new job - CT abdomen pelvis shows 4.5X3.4X 7.6 cm hepatic abscess. - IR engaged patient had drain placed, noted purulent fluid analysis aspiration cultures grew strep constellatus - Patient was transition to Augmentin on 92/23 take also fevered on 12/16/2022 - Repeat CT on 9/24 showed enlarging hepatic abscess.  ID engaged.  #1/4 bottles blood cultures growing GPR - Follow-up blood cultures - Follow repeat blood cultures from 9/24 Recommendations:  -Continue Unasyn - Follow IR recommendations of possible drain change/upsize  #DM AIC 8.4 on 12/12/2022 Microbiology:   Antibiotics: Pip-tazo 9/19-9/23 Augmentin 9/23 Unasyn 9/24-  Cultures: Blood 9/19 1 out of 4 bottles GPR cultures reintubating 9/24 Urine  Other 9/21 liver abscess strep constellatus  HPI: Lawrence Brock is a 52 y.o. male with history of alcoholic cirrhosis with EV, COPD cath diabetes, hypertension, chronic pancreatitis, portal vein thrombosis not on AC,  pancreatic mass, thrombocytopenia, tobacco use disorder presented with abdominal pain which started 4 to 5 days prior to presentation.  On arrival to the ED he had a temp of 101.2 with leukocytosis.  CT abdomen pelvis showed 4.5 X3.4X 7.6 cm hepatic abscess, intrahepatic bile duct dilatation, chronic pancreatitis, chronic occlusion of superior mesenteric vein.  Started on pip-tazo.  IR was engaged and patient underwent drain placement on 9/21 with aspiration of 50 mL of purulent fluid cultures.  Constellatus patient was transition to Augmentin.  He fevered and repeat CT on 9/24 showed overall size of the abscess increased, new abscess noted likely neck secondary to needle aspiration.  Infectious disease engaged.  Review of Systems: Review of Systems  All other systems reviewed and are negative.   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 pseudocyst in 08/2013. Follwed by Dr Rob Bunting and Rise Mu (at [facility]).     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    Hypertension    No meds yet.   Liver abscess 01/2014   Pneumonia    10-30-14 [facility] hospital admission, no problems now.   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    Thrombocytopenia (HCC) 2015   with splenomegaly 03/2014.  s/p bone marrow biopsy, nonspecific findings 09/2015    Social History  Tobacco Use   Smoking status: Every Day    Current packs/day: 0.50    Types: Cigarettes   Smokeless tobacco: Never  Substance Use Topics   Alcohol use: No    Alcohol/week: 12.0 standard drinks of alcohol    Types: 12 Cans of beer per week    Comment: former ETOH abuse, quit a month ago per patient   Drug use: No    Family History  Problem  Relation Age of Onset   Hypertension Mother    Scheduled Meds:  bisacodyl  10 mg Rectal Once   FLUoxetine  10 mg Oral Daily   insulin aspart  0-15 Units Subcutaneous TID WC   insulin aspart  0-5 Units Subcutaneous QHS   mometasone-formoterol  2 puff Inhalation BID   nicotine  14 mg Transdermal Daily   pantoprazole  40 mg Oral QHS   senna-docusate  1 tablet Oral QHS   sodium chloride flush  5 mL Intracatheter Q8H   Continuous Infusions:  ampicillin-sulbactam (UNASYN) IV 3 g (12/19/22 0027)   PRN Meds:.acetaminophen, HYDROcodone-acetaminophen, ipratropium-albuterol, methocarbamol, ondansetron **OR** ondansetron (ZOFRAN) IV, mouth rinse, polyethylene glycol No Known Allergies  OBJECTIVE: Blood pressure 122/84, pulse 71, temperature 98.1 F (36.7 C), temperature source Oral, resp. rate 20, height 5\' 8"  (1.727 m), weight 60.9 kg, SpO2 97%.  Physical Exam Constitutional:      General: He is not in acute distress.    Appearance: He is normal weight. He is not toxic-appearing.  HENT:     Head: Normocephalic and atraumatic.     Right Ear: External ear normal.     Left Ear: External ear normal.     Nose: No congestion or rhinorrhea.     Mouth/Throat:     Mouth: Mucous membranes are moist.     Pharynx: Oropharynx is clear.  Eyes:     Extraocular Movements: Extraocular movements intact.     Conjunctiva/sclera: Conjunctivae normal.     Pupils: Pupils are equal, round, and reactive to light.  Cardiovascular:     Rate and Rhythm: Normal rate and regular rhythm.     Heart sounds: No murmur heard.    No friction rub. No gallop.  Pulmonary:     Effort: Pulmonary effort is normal.     Breath sounds: Normal breath sounds.  Abdominal:     General: Abdomen is flat. Bowel sounds are normal.     Comments: Abd drain  Musculoskeletal:        General: No swelling. Normal range of motion.     Cervical back: Normal range of motion and neck supple.  Skin:    General: Skin is warm and dry.   Neurological:     General: No focal deficit present.     Mental Status: He is oriented to person, place, and time.  Psychiatric:        Mood and Affect: Mood normal.     Lab Results Lab Results  Component Value Date   WBC 5.7 12/19/2022   HGB 9.6 (L) 12/19/2022   HCT 29.5 (L) 12/19/2022   MCV 88.3 12/19/2022   PLT 75 (L) 12/19/2022    Lab Results  Component Value Date   CREATININE 0.69 12/18/2022   BUN 8 12/18/2022   NA 135 12/18/2022   K 3.8 12/18/2022   CL 105 12/18/2022   CO2 23 12/18/2022    Lab Results  Component Value Date   ALT 23 12/18/2022   AST 15 12/18/2022   ALKPHOS 275 (H) 12/18/2022  BILITOT 1.1 12/18/2022       Danelle Earthly, MD [facility] 12/19/2022, 5:49 AM   I have personally spent 84 minutes involved in face-to-face and non-face-to-face activities for this patient on the day of the visit. Professional time spent includes the following activities: Preparing to see the patient (review of tests), Obtaining and/or reviewing separately obtained history (admission/discharge record), Performing a medically appropriate examination and/or evaluation , Ordering medications/tests/procedures, referring and communicating with other health care professionals, Documenting clinical information in the EMR, Independently interpreting results (not separately reported), Communicating results to the patient/family/caregiver, Counseling and educating the patient/family/caregiver and Care coordination (not separately reported).

## 2022-12-19 NOTE — Progress Notes (Signed)
PROGRESS NOTE    Lawrence Brock  ZHY:[id number] DOB: 1970-11-17 DOA: 12/11/2022 PCP: Verlon Au, MD    Chief Complaint  Patient presents with   Abdominal Pain   Fever    Brief Narrative:  PMH of EtOH cirrhosis, COPD, type II DM, HTN, chronic pancreatitis, portal vein thrombosis not on anticoagulation due to thrombocytopenia, active smoker, alcohol abuse in remission presented to the hospital with complaints of abdominal pain and fever.  Found to have multifocal hepatic abscess treated with drainage and antibiotic. Due to persistent fever repeat CT abdomen performed 9/24 showed evidence of increase in the size of the abscess.   Assessment & Plan:   Principal Problem:   Sepsis (HCC) Active Problems:   Thrombocytopenia, unspecified (HCC)   Tobacco abuse   Hepatic abscess   Common biliary duct stricture   Pancytopenia (HCC)   Portal vein thrombosis   Splenomegaly   Chronic obstructive pulmonary disease (HCC)   Gastroesophageal reflux disease   Type 2 diabetes mellitus without complication, without long-term current use of insulin (HCC)   Constipation  #1 sepsis secondary to multifocal hepatic abscesses, POA -Patient met criteria on admission for sepsis with fever, tachycardia, hypotension, CT scan findings concerning for multifocal abscesses. -Patient had been initially admitted to the stepdown unit treated with IV fluids and IV antibiotics with improvement with hypotension. -Patient initially on IV Zosyn and subsequently transitioned to Augmentin and subsequently to IV Unasyn. -Patient seen in consultation by general surgery who feel patient is not a candidate at this time for surgical intervention and recommended evaluation by IR for drain placement and IV antibiotics. -Patient also seen in consultation by GI, who recommended surgery and IR input due to patient's complex hepatobiliary with concerns for abscess formation. -Patient subsequently underwent CT-guided  CT-guided drain placement on 12/14/2022 with preliminary cultures growing Streptococcus constellatus -Repeat CT abdomen and pelvis done 12/17/2022 with evidence of increasing/worsening abscess size. -Patient reassessed by IR who recommended image guided hepatic drain exchange/manipulation/upsize/possible additional hepatic drain placement to be done on 12/19/2022. -Patient currently n.p.o. in anticipation of procedure.   -Continue IV Unasyn.  -Patient seen in consultation by ID who recommended continuation of Unasyn, follow-up on repeat blood cultures from 9/24 and IR recommendations for possible drain change/upsize.   -Hold Lovenox today pending procedure.    2.  Type 2 diabetes mellitus, without complication, without long-term insulin use -Hemoglobin A1c noted at 8.4. -CBG 169 this morning.   -Continue to hold home regimen of metformin.   -SSI.   3.  GERD -Continue Protonix  4.  COPD -Continue Dulera.   -DuoNebs as needed.   5.  Constipation -Patient noted with large stool burden on CT abdomen and pelvis. -Patient denies feeling constipated states has been having regular bowel movements daily.   -Continue Senokot-S nightly.  MiraLAX daily as needed.   6.  Anemia/chronic thrombocytopenia -Stable. -Patient with no overt bleeding.  7.  Mood disorder -Prozac.   8.  Tobacco abuse -Tobacco cessation.     DVT prophylaxis: Lovenox Code Status: Full Family Communication: Updated patient.  No family at bedside. Disposition: TBD  Status is: Inpatient Remains inpatient appropriate because: Severity of illness   Consultants:  Gastroenterology: Dr. Elnoria Howard 12/12/2022 General Surgery: Dr. Carolynne Edouard III 12/13/2022 ID: Dr. Thedore Mins 12/18/2022  Procedures:  CT abdomen and pelvis 12/12/2022, 12/17/2022 Abdominal ultrasound 12/12/2022 CT-guided placement of 10 French all-purpose drainage catheter into right hepatic fluid collection with aspiration of 15 mL of sanguinous, purulent fluid: Per IR: Dr.  Suttle 12/14/2022  Antimicrobials:  Anti-infectives (From admission, onward)    Start     Dose/Rate Route Frequency Ordered Stop   12/17/22 1800  Ampicillin-Sulbactam (UNASYN) 3 g in sodium chloride 0.9 % 100 mL IVPB        3 g 200 mL/hr over 30 Minutes Intravenous Every 6 hours 12/17/22 0846     12/16/22 1700  amoxicillin-clavulanate (AUGMENTIN) 875-125 MG per tablet 1 tablet  Status:  Discontinued        1 tablet Oral Every 12 hours 12/16/22 1610 12/17/22 0833   12/12/22 1600  piperacillin-tazobactam (ZOSYN) IVPB 3.375 g  Status:  Discontinued        3.375 g 12.5 mL/hr over 240 Minutes Intravenous Every 8 hours 12/12/22 1304 12/16/22 1610   12/12/22 0745  piperacillin-tazobactam (ZOSYN) IVPB 3.375 g        3.375 g 100 mL/hr over 30 Minutes Intravenous  Once 12/12/22 0737 12/12/22 1154         Subjective: Lying in bed.  Denies any abdominal pain at this time.  Overall feeling well.  No nausea or vomiting.  Denies any chest pain or shortness of breath.  States output from drain has been minimal.  Patient states has been having regular bowel movements and does not feel constipated.  Objective: Vitals:   12/18/22 1923 12/18/22 2104 12/19/22 0530 12/19/22 0755  BP:  105/70 122/84   Pulse:  85 71   Resp:  (!) 22 20   Temp:  98.2 F (36.8 C) 98.1 F (36.7 C)   TempSrc:  Oral Oral   SpO2: 95% 94% 97% 97%  Weight:      Height:        Intake/Output Summary (Last 24 hours) at 12/19/2022 0931 Last data filed at 12/19/2022 0500 Gross per 24 hour  Intake 770 ml  Output 358 ml  Net 412 ml   Filed Weights   12/12/22 0010 12/14/22 1655  Weight: 54.4 kg 60.9 kg    Examination:  General exam: NAD Respiratory system: Lungs clear to auscultation bilaterally.  No wheezes, no crackles, no rhonchi.  Fair air movement.  Speaking in full sentences.   Cardiovascular system: Regular rate rhythm no murmurs rubs or gallops.  No JVD.  No lower extremity edema.   Gastrointestinal system:  Abdomen is soft, nontender, nondistended, positive bowel sounds.  Right hepatic drain noted with some purulent fluid noted.  Central nervous system: Alert and oriented. No focal neurological deficits. Extremities: Symmetric 5 x 5 power. Skin: No rashes, lesions or ulcers Psychiatry: Judgement and insight appear normal. Mood & affect appropriate.     Data Reviewed: I have personally reviewed following labs and imaging studies  CBC: Recent Labs  Lab 12/15/22 0302 12/16/22 0457 12/17/22 0916 12/18/22 0519 12/19/22 0447  WBC 9.6 9.5 14.5* 7.5 5.7  NEUTROABS 8.8* 8.5* 13.0* 6.3 4.9  HGB 10.8* 10.3* 9.9* 9.4* 9.6*  HCT 32.7* 31.7* 29.6* 29.3* 29.5*  MCV 89.1 89.0 85.3 88.5 88.3  PLT 58* 55* 78* 69* 75*    Basic Metabolic Panel: Recent Labs  Lab 12/12/22 1151 12/13/22 0608 12/15/22 0302 12/16/22 0457 12/17/22 0916 12/18/22 0519 12/19/22 0447  NA  --    < > 137 135 130* 135 135  K  --    < > 3.7 3.7 3.2* 3.8 4.4  CL  --    < > 109 102 98 105 105  CO2  --    < > 24 25 21* 23 26  GLUCOSE  --    < > 264* 146* 135* 150* 201*  BUN  --    < > 15 15 10 8 10   CREATININE 0.86   < > 0.81 0.80 0.70 0.69 0.77  CALCIUM  --    < > 7.2* 7.6* 7.5* 7.5* 7.9*  MG 1.7  --   --   --  1.9 2.1 2.0   < > = values in this interval not displayed.    GFR: Estimated Creatinine Clearance: 94.1 mL/min (by C-G formula based on SCr of 0.77 mg/dL).  Liver Function Tests: Recent Labs  Lab 12/15/22 0302 12/16/22 0457 12/17/22 0916 12/18/22 0519 12/19/22 0447  AST 44* 23 20 15  14*  ALT 51* 38 29 23 21   ALKPHOS 306* 278* 320* 275* 276*  BILITOT 0.9 1.2 1.3* 1.1 0.8  PROT 5.6* 5.7* 6.2* 5.8* 6.4*  ALBUMIN 1.8* 1.8* 2.0* 1.8* 1.9*    CBG: Recent Labs  Lab 12/18/22 0732 12/18/22 1155 12/18/22 1710 12/18/22 2107 12/19/22 0725  GLUCAP 129* 133* 230* 280* 169*     Recent Results (from the past 240 hour(s))  Blood culture (routine x 2)     Status: None (Preliminary result)   Collection  Time: 12/12/22  8:04 AM   Specimen: BLOOD  Result Value Ref Range Status   Specimen Description   Final    BLOOD SITE NOT SPECIFIED Performed at [facility], [address], [place], [place] [address]    Special Requests   Final    BOTTLES DRAWN AEROBIC AND ANAEROBIC Blood Culture adequate volume Performed at [facility], [address] [place], [place] [address]    Culture  Setup Time   Final    GRAM POSITIVE RODS ANAEROBIC BOTTLE ONLY CRITICAL RESULT CALLED TO, READ BACK BY AND VERIFIED WITH: PHARMD ELLEN JACKSON ON 12/13/22 @ 2315 BY DRT    Culture   Final    GRAM POSITIVE RODS SENT TO [facility] FOR IDENTIFICATION. Performed at [facility], [address], [place], [place] [address]    Report Status PENDING  Incomplete  Blood culture (routine x 2)     Status: None   Collection Time: 12/12/22  8:04 AM   Specimen: BLOOD  Result Value Ref Range Status   Specimen Description   Final    BLOOD SITE NOT SPECIFIED Performed at [facility], [address], [place], [place] [address]    Special Requests   Final    BOTTLES DRAWN AEROBIC AND ANAEROBIC Blood Culture adequate volume Performed at [facility], [address] [place], [place] [address]    Culture   Final    NO GROWTH 5 DAYS Performed at [facility], [address], [place], [place] [address]    Report Status 12/17/2022 FINAL  Final  SARS Coronavirus 2 by RT PCR (hospital order, performed in [facility] hospital lab) *cepheid single result test* Anterior Nasal Swab     Status: None   Collection Time: 12/12/22  8:32 AM   Specimen: Anterior Nasal Swab  Result Value Ref Range Status   SARS Coronavirus 2 by RT PCR NEGATIVE NEGATIVE Final    Comment: (NOTE) SARS-CoV-2 target nucleic acids are NOT DETECTED.  The SARS-CoV-2 RNA is generally detectable in upper and lower respiratory specimens during the acute phase of infection. The  lowest concentration of SARS-CoV-2 viral copies this assay can detect is 250 copies / mL. A negative result does  not preclude SARS-CoV-2 infection and should not be used as the sole basis for treatment or other patient management decisions.  A negative result may occur with improper specimen collection / handling, submission of specimen other than nasopharyngeal swab, presence of viral mutation(s) within the areas targeted by this assay, and inadequate number of viral copies (<250 copies / mL). A negative result must be combined with clinical observations, patient history, and epidemiological information.  Fact Sheet for Patients:   RoadLapTop.co.za  Fact Sheet for Healthcare Providers: http://kim-miller.com/  This test is not yet approved or  cleared by the [place] FDA and has been authorized for detection and/or diagnosis of SARS-CoV-2 by FDA under an Emergency Use Authorization (EUA).  This EUA will remain in effect (meaning this test can be used) for the duration of the COVID-19 declaration under Section 564(b)(1) of the Act, 21 U.S.C. section 360bbb-3(b)(1), unless the authorization is terminated or revoked sooner.  Performed at [facility], [address], [place], [place] [address]   Aerobic/Anaerobic Culture w Gram Stain (surgical/deep wound)     Status: None (Preliminary result)   Collection Time: 12/14/22 10:17 AM   Specimen: Liver; Abscess  Result Value Ref Range Status   Specimen Description   Final    LIVER ABSCESS Performed at [facility], [address] [place], [place] [address]    Special Requests   Final    NONE Performed at [facility], [address] [place], [place] [address]    Gram Stain   Final    NO WBC SEEN NO ORGANISMS SEEN Performed at [facility], [address], [place], [place] [address]    Culture   Final    FEW  STREPTOCOCCUS CONSTELLATUS Beta hemolytic streptococci are predictably susceptible to penicillin and other beta lactams. Susceptibility testing not routinely performed. NO ANAEROBES ISOLATED; CULTURE IN PROGRESS FOR 5 DAYS    Report Status PENDING  Incomplete  MRSA Next Gen by PCR, Nasal     Status: None   Collection Time: 12/14/22  5:08 PM   Specimen: Nasal Mucosa; Nasal Swab  Result Value Ref Range Status   MRSA by PCR Next Gen NOT DETECTED NOT DETECTED Final    Comment: (NOTE) The GeneXpert MRSA Assay (FDA approved for NASAL specimens only), is one component of a comprehensive MRSA colonization surveillance program. It is not intended to diagnose MRSA infection nor to guide or monitor treatment for MRSA infections. Test performance is not FDA approved in patients less than 83 years old. Performed at [facility], [address] [place], [place] [address]   Culture, blood (Routine X 2) w Reflex to ID Panel     Status: None (Preliminary result)   Collection Time: 12/17/22  9:16 AM   Specimen: BLOOD  Result Value Ref Range Status   Specimen Description   Final    BLOOD SITE NOT SPECIFIED Performed at [facility], [address], [place], [place] [address]    Special Requests   Final    BOTTLES DRAWN AEROBIC AND ANAEROBIC Blood Culture adequate volume Performed at [facility], [address], [place], [place] [address]    Culture   Final    NO GROWTH 2 DAYS Performed at [facility], [address] [place], [place] [address]    Report Status PENDING  Incomplete  Culture, blood (Routine X 2) w Reflex to ID Panel     Status:  None (Preliminary result)   Collection Time: 12/17/22  9:16 AM   Specimen: BLOOD  Result Value Ref Range Status   Specimen Description   Final    BLOOD SITE NOT SPECIFIED Performed at [facility], [address], [place], [place] [address]    Special Requests   Final     BOTTLES DRAWN AEROBIC AND ANAEROBIC Blood Culture adequate volume Performed at [facility], [address], [place], [place] [address]    Culture   Final    NO GROWTH 2 DAYS Performed at [facility], [address], [place], [place] [address]    Report Status PENDING  Incomplete         Radiology Studies: CT ABDOMEN PELVIS W CONTRAST  Result Date: 12/17/2022 CLINICAL DATA:  Intraabdominal abscess EXAM: CT ABDOMEN AND PELVIS WITH CONTRAST TECHNIQUE: Multidetector CT imaging of the abdomen and pelvis was performed using the standard protocol following bolus administration of intravenous contrast. RADIATION DOSE REDUCTION: This exam was performed according to the departmental dose-optimization program which includes automated exposure control, adjustment of the mA and/or kV according to patient size and/or use of iterative reconstruction technique. CONTRAST:  OMNIPAQUE IOHEXOL 300 MG/ML  SOLN COMPARISON:  CT abdomen and pelvis 12/12/2022 FINDINGS: Lower chest: There are small bilateral pleural effusions, right greater than left, which are new from prior. There is new atelectasis in the bilateral lung bases. Hepatobiliary: Heterogeneous multi-septated peripherally enhancing low-attenuation area in the liver is again seen. There is a new percutaneous drainage catheter in the inferior portion. Overall size has increased now measuring 8.0 x 4.9 x 11.0 cm (previously 4.5 x 3.4 x 7.6 cm). Note is made of a new linear tract measuring 5.5 cm in length containing air and fluid likely secondary to needle/procedure located anterior to this lesion. There is stable intrahepatic biliary ductal dilatation. The gallbladder is unremarkable. Pancreas: There is diffuse pancreatic atrophy, ductal dilatation in calcifications compatible with chronic pancreatitis. Pancreatic head stent is unchanged in position. Spleen: The spleen is moderately enlarged and unchanged in size. Diffuse  heterogeneity of the spleen may be related to phase of contrast. Adrenals/Urinary Tract: There is no hydronephrosis or perinephric stranding. Bilateral renal cysts are unchanged. Bladder and adrenal glands are within normal limits. Stomach/Bowel: Stomach is within normal limits. No evidence of bowel wall thickening, distention, or inflammatory changes. The appendix is not seen. There is a large amount of stool throughout the colon. Vascular/Lymphatic: Aorta and IVC are normal in size. 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 No lymphadenopathy identified. Reproductive: Prostate is unremarkable. Other: There small fat containing inguinal hernias. There is new small amount of ascites in the abdomen and pelvis. There is new body wall edema. Musculoskeletal: No fracture is seen. IMPRESSION: 1. Interval placement of percutaneous drainage catheter in the inferior portion of the liver abscess. Overall size of the abscess has increased in size. 2. New linear tract containing air and fluid anterior to the abscess likely secondary to needle/procedure. 3. New small bilateral pleural effusions with bibasilar atelectasis. 4. New small amount of ascites and body wall edema. 5. Stable findings of chronic pancreatitis and pancreatic stent. 6. Stable splenomegaly. 7. 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 8. Large amount of stool throughout the colon. Electronically Signed   By: Darliss Cheney M.D.   On: 12/17/2022 16:53  Scheduled Meds:  bisacodyl  10 mg Rectal Once   FLUoxetine  10 mg Oral Daily   insulin aspart  0-15 Units Subcutaneous TID WC   insulin aspart  0-5 Units Subcutaneous QHS   mometasone-formoterol  2 puff Inhalation BID   nicotine  14 mg Transdermal Daily   pantoprazole  40 mg Oral QHS   senna-docusate  1 tablet Oral QHS   sodium chloride flush  5 mL  Intracatheter Q8H   Continuous Infusions:  ampicillin-sulbactam (UNASYN) IV 3 g (12/19/22 0629)     LOS: 7 days    Time spent: 35 minutes    Ramiro Harvest, MD [facility]   To contact the attending provider between 7A-7P or the covering provider during after hours 7P-7A, please log into the web site [email or web address] and access using universal [place] password for that web site. If you do not have the password, please call the hospital operator.  12/19/2022, 9:31 AM

## 2022-12-19 NOTE — Progress Notes (Signed)
Interventional Radiology Procedure Note  Procedure: Fluoroscopic guided drain exchange  Findings: Please refer to procedural dictation for full description. 10 Fr pigtail drain exchanged, with new drain repositioned in more superior aspect of hepatic fluid collection.  Drain placed to bulb suction.  Complications: None immediate  Estimated Blood Loss: < 5 mL  Recommendations: Keep to bulb suction for now. IR will follow.   Marliss Coots, MD

## 2022-12-20 DIAGNOSIS — D696 Thrombocytopenia, unspecified: Secondary | ICD-10-CM | POA: Diagnosis not present

## 2022-12-20 DIAGNOSIS — J449 Chronic obstructive pulmonary disease, unspecified: Secondary | ICD-10-CM | POA: Diagnosis not present

## 2022-12-20 DIAGNOSIS — K750 Abscess of liver: Secondary | ICD-10-CM | POA: Diagnosis not present

## 2022-12-20 DIAGNOSIS — A419 Sepsis, unspecified organism: Secondary | ICD-10-CM | POA: Diagnosis not present

## 2022-12-20 LAB — CBC WITH DIFFERENTIAL/PLATELET
Abs Immature Granulocytes: 0.25 10*3/uL — ABNORMAL HIGH (ref 0.00–0.07)
Basophils Absolute: 0 10*3/uL (ref 0.0–0.1)
Basophils Relative: 0 %
Eosinophils Absolute: 0 10*3/uL (ref 0.0–0.5)
Eosinophils Relative: 0 %
HCT: 29.3 % — ABNORMAL LOW (ref 39.0–52.0)
Hemoglobin: 9.4 g/dL — ABNORMAL LOW (ref 13.0–17.0)
Immature Granulocytes: 4 %
Lymphocytes Relative: 6 %
Lymphs Abs: 0.4 10*3/uL — ABNORMAL LOW (ref 0.7–4.0)
MCH: 28.7 pg (ref 26.0–34.0)
MCHC: 32.1 g/dL (ref 30.0–36.0)
MCV: 89.6 fL (ref 80.0–100.0)
Monocytes Absolute: 0.4 10*3/uL (ref 0.1–1.0)
Monocytes Relative: 6 %
Neutro Abs: 5.8 10*3/uL (ref 1.7–7.7)
Neutrophils Relative %: 84 %
Platelets: 84 10*3/uL — ABNORMAL LOW (ref 150–400)
RBC: 3.27 MIL/uL — ABNORMAL LOW (ref 4.22–5.81)
RDW: 13.7 % (ref 11.5–15.5)
WBC: 7 10*3/uL (ref 4.0–10.5)
nRBC: 0 % (ref 0.0–0.2)

## 2022-12-20 LAB — GLUCOSE, CAPILLARY
Glucose-Capillary: 154 mg/dL — ABNORMAL HIGH (ref 70–99)
Glucose-Capillary: 138 mg/dL — ABNORMAL HIGH (ref 70–99)
Glucose-Capillary: 129 mg/dL — ABNORMAL HIGH (ref 70–99)
Glucose-Capillary: 198 mg/dL — ABNORMAL HIGH (ref 70–99)

## 2022-12-20 LAB — COMPREHENSIVE METABOLIC PANEL
ALT: 20 U/L (ref 0–44)
AST: 18 U/L (ref 15–41)
Albumin: 1.9 g/dL — ABNORMAL LOW (ref 3.5–5.0)
Alkaline Phosphatase: 335 U/L — ABNORMAL HIGH (ref 38–126)
Anion gap: 5 (ref 5–15)
BUN: 9 mg/dL (ref 6–20)
CO2: 23 mmol/L (ref 22–32)
Calcium: 7.5 mg/dL — ABNORMAL LOW (ref 8.9–10.3)
Chloride: 102 mmol/L (ref 98–111)
Creatinine, Ser: 0.81 mg/dL (ref 0.61–1.24)
GFR, Estimated: >60 mL/min (ref >60)
Glucose, Bld: 208 mg/dL — ABNORMAL HIGH (ref 70–99)
Potassium: 4.5 mmol/L (ref 3.5–5.1)
Sodium: 130 mmol/L — ABNORMAL LOW (ref 135–145)
Total Bilirubin: 0.7 mg/dL (ref 0.3–1.2)
Total Protein: 6.2 g/dL — ABNORMAL LOW (ref 6.5–8.1)

## 2022-12-20 MED ORDER — AMOXICILLIN-POT CLAVULANATE 875-125 MG PO TABS
1.0000 | ORAL_TABLET | Freq: Two times a day (BID) | ORAL | Status: DC
  Administered 2022-12-20 – 2022-12-21 (×3): 1 via ORAL
  Filled 2022-12-20 (×3): qty 1

## 2022-12-20 NOTE — Plan of Care (Signed)
  Problem: Safety: Goal: Ability to remain free from injury will improve Outcome: Progressing   

## 2022-12-20 NOTE — Progress Notes (Signed)
PROGRESS NOTE    Lawrence Brock  YTK:[id number] DOB: 1970/11/12 DOA: 12/11/2022 PCP: Verlon Au, MD    Chief Complaint  Patient presents with   Abdominal Pain   Fever    Brief Narrative:  PMH of EtOH cirrhosis, COPD, type II DM, HTN, chronic pancreatitis, portal vein thrombosis not on anticoagulation due to thrombocytopenia, active smoker, alcohol abuse in remission presented to the hospital with complaints of abdominal pain and fever.  Found to have multifocal hepatic abscess treated with drainage and antibiotic. Due to persistent fever repeat CT abdomen performed 9/24 showed evidence of increase in the size of the abscess.   Assessment & Plan:   Principal Problem:   Sepsis (HCC) Active Problems:   Thrombocytopenia, unspecified (HCC)   Tobacco abuse   Hepatic abscess   Common biliary duct stricture   Pancytopenia (HCC)   Portal vein thrombosis   Splenomegaly   Chronic obstructive pulmonary disease (HCC)   Gastroesophageal reflux disease   Type 2 diabetes mellitus without complication, without long-term current use of insulin (HCC)   Constipation  #1 sepsis secondary to multifocal hepatic abscesses, POA -Patient met criteria on admission for sepsis with fever, tachycardia, hypotension, CT scan findings concerning for multifocal abscesses. -Patient had been initially admitted to the stepdown unit treated with IV fluids and IV antibiotics with improvement with hypotension. -Patient initially on IV Zosyn and subsequently transitioned to Augmentin and subsequently to IV Unasyn. -Patient seen in consultation by general surgery who feel patient is not a candidate at this time for surgical intervention and recommended evaluation by IR for drain placement and IV antibiotics. -Patient also seen in consultation by GI, who recommended surgery and IR input due to patient's complex hepatobiliary with concerns for abscess formation. -Patient subsequently underwent CT-guided  CT-guided drain placement on 12/14/2022 with preliminary cultures growing Streptococcus constellatus -Repeat CT abdomen and pelvis done 12/17/2022 with evidence of increasing/worsening abscess size. -Patient reassessed by IR who recommended image guided hepatic drain exchange/manipulation/upsize/possible additional hepatic drain placement to be done on 12/19/2022. -Patient subsequently underwent drain exchange with new drain reposition the more superior aspect of the hepatic fluid collection. -cultures growing Streptococcus constellatus and Peptostreptococcus microbes. -Repeat blood cultures with no growth to date. -Patient was on IV Unasyn. -Patient seen in consultation by ID and IV Unasyn has been transitioned to Augmentin and ID recommending 4 weeks of treatment from drain placement on 927 with a EOT of 01/16/2023. -Will need ID follow-up which has been scheduled for 01/07/2023 with Dr. Elinor Parkinson. -IR following.  2.  Type 2 diabetes mellitus, without complication, without long-term insulin use -Hemoglobin A1c noted at 8.4. -CBG at 154.   -Continue to hold home oral hypoglycemic agents.   -SSI.    3.  GERD -PPI.    4.  COPD -Continue Dulera.   -DuoNebs as needed.   5.  Constipation -Patient noted with large stool burden on CT abdomen and pelvis. -Patient denies feeling constipated states has been having regular bowel movements daily.   -Continue current bowel regimen of Senokot-S nightly, MiraLAX daily as needed.  6.  Anemia/chronic thrombocytopenia -Stable. -Patient with no overt bleeding.  7.  Mood disorder -Continue Prozac.   8.  Tobacco abuse -Tobacco cessation.     DVT prophylaxis: Lovenox Code Status: Full Family Communication: Updated patient.  No family at bedside. Disposition: Likely home in the next 24 hours if continued clinical improvement.   Status is: Inpatient Remains inpatient appropriate because: Severity of illness   Consultants:  Gastroenterology:  Dr. Elnoria Howard 12/12/2022 General Surgery: Dr. Carolynne Edouard III 12/13/2022 ID: Dr. Thedore Mins 12/18/2022  Procedures:  CT abdomen and pelvis 12/12/2022, 12/17/2022 Abdominal ultrasound 12/12/2022 CT-guided placement of 10 French all-purpose drainage catheter into right hepatic fluid collection with aspiration of 15 mL of sanguinous, purulent fluid: Per IR: Dr. Elby Showers 12/14/2022 Fluoroscopic guided drain exchange with new drain repositioning a more superior aspect of hepatic fluid collection: Per IR: Dr. Elby Showers 12/19/2022  Antimicrobials:  Anti-infectives (From admission, onward)    Start     Dose/Rate Route Frequency Ordered Stop   12/20/22 1200  amoxicillin-clavulanate (AUGMENTIN) 875-125 MG per tablet 1 tablet        1 tablet Oral Every 12 hours 12/20/22 0915 01/16/23 2359   12/17/22 1800  Ampicillin-Sulbactam (UNASYN) 3 g in sodium chloride 0.9 % 100 mL IVPB  Status:  Discontinued        3 g 200 mL/hr over 30 Minutes Intravenous Every 6 hours 12/17/22 0846 12/20/22 0915   12/16/22 1700  amoxicillin-clavulanate (AUGMENTIN) 875-125 MG per tablet 1 tablet  Status:  Discontinued        1 tablet Oral Every 12 hours 12/16/22 1610 12/17/22 0833   12/12/22 1600  piperacillin-tazobactam (ZOSYN) IVPB 3.375 g  Status:  Discontinued        3.375 g 12.5 mL/hr over 240 Minutes Intravenous Every 8 hours 12/12/22 1304 12/16/22 1610   12/12/22 0745  piperacillin-tazobactam (ZOSYN) IVPB 3.375 g        3.375 g 100 mL/hr over 30 Minutes Intravenous  Once 12/12/22 0737 12/12/22 1154         Subjective: Patient laying in bed.  Denies any chest pain or shortness of breath.  No abdominal pain.  Tolerating current diet.  Overall feels well.  Patient states he still ID doctor today.  Objective: Vitals:   12/19/22 1957 12/19/22 2019 12/20/22 0530 12/20/22 0734  BP: 128/88  (!) 127/93   Pulse: 94  80   Resp: 18  20   Temp: 98.3 F (36.8 C)  98.8 F (37.1 C)   TempSrc: Oral  Oral   SpO2: 97% 97% 96% 97%  Weight:       Height:        Intake/Output Summary (Last 24 hours) at 12/20/2022 1228 Last data filed at 12/20/2022 1129 Gross per 24 hour  Intake 488.9 ml  Output 1780 ml  Net -1291.1 ml   Filed Weights   12/12/22 0010 12/14/22 1655  Weight: 54.4 kg 60.9 kg    Examination:  General exam: NAD Respiratory system: CTAB.  No wheezes, no crackles, no rhonchi.  Fair air movement.  Speaking in full sentences.  Cardiovascular system: RRR no murmurs rubs or gallops.  No JVD.  No pitting lower extremity edema.   Gastrointestinal system: Abdomen is soft, nontender, nondistended, positive bowel sounds.  Right hepatic drain with sanguinous fluid noted.  Central nervous system: Alert and oriented. No focal neurological deficits. Extremities: Symmetric 5 x 5 power. Skin: No rashes, lesions or ulcers Psychiatry: Judgement and insight appear normal. Mood & affect appropriate.     Data Reviewed: I have personally reviewed following labs and imaging studies  CBC: Recent Labs  Lab 12/16/22 0457 12/17/22 0916 12/18/22 0519 12/19/22 0447 12/20/22 0454  WBC 9.5 14.5* 7.5 5.7 7.0  NEUTROABS 8.5* 13.0* 6.3 4.9 5.8  HGB 10.3* 9.9* 9.4* 9.6* 9.4*  HCT 31.7* 29.6* 29.3* 29.5* 29.3*  MCV 89.0 85.3 88.5 88.3 89.6  PLT 55* 78* 69* 75* 84*  Basic Metabolic Panel: Recent Labs  Lab 12/16/22 0457 12/17/22 0916 12/18/22 0519 12/19/22 0447 12/20/22 0454  NA 135 130* 135 135 130*  K 3.7 3.2* 3.8 4.4 4.5  CL 102 98 105 105 102  CO2 25 21* 23 26 23   GLUCOSE 146* 135* 150* 201* 208*  BUN 15 10 8 10 9   CREATININE 0.80 0.70 0.69 0.77 0.81  CALCIUM 7.6* 7.5* 7.5* 7.9* 7.5*  MG  --  1.9 2.1 2.0  --     GFR: Estimated Creatinine Clearance: 92.9 mL/min (by C-G formula based on SCr of 0.81 mg/dL).  Liver Function Tests: Recent Labs  Lab 12/16/22 0457 12/17/22 0916 12/18/22 0519 12/19/22 0447 12/20/22 0454  AST 23 20 15  14* 18  ALT 38 29 23 21 20   ALKPHOS 278* 320* 275* 276* 335*  BILITOT 1.2 1.3*  1.1 0.8 0.7  PROT 5.7* 6.2* 5.8* 6.4* 6.2*  ALBUMIN 1.8* 2.0* 1.8* 1.9* 1.9*    CBG: Recent Labs  Lab 12/19/22 1115 12/19/22 1608 12/19/22 2057 12/20/22 0732 12/20/22 1129  GLUCAP 134* 114* 226* 154* 138*     Recent Results (from the past 240 hour(s))  Blood culture (routine x 2)     Status: None (Preliminary result)   Collection Time: 12/12/22  8:04 AM   Specimen: BLOOD  Result Value Ref Range Status   Specimen Description   Final    BLOOD SITE NOT SPECIFIED Performed at [facility], [address] [place], [place] [address]    Special Requests   Final    BOTTLES DRAWN AEROBIC AND ANAEROBIC Blood Culture adequate volume Performed at [facility], [address] [place], [place] [address]    Culture  Setup Time   Final    GRAM POSITIVE RODS ANAEROBIC BOTTLE ONLY CRITICAL RESULT CALLED TO, READ BACK BY AND VERIFIED WITH: PHARMD ELLEN JACKSON ON 12/13/22 @ 2315 BY DRT    Culture   Final    GRAM POSITIVE RODS SENT TO [facility] FOR IDENTIFICATION. Performed at [facility], [address], [place], [place] [address]    Report Status PENDING  Incomplete  Blood culture (routine x 2)     Status: None   Collection Time: 12/12/22  8:04 AM   Specimen: BLOOD  Result Value Ref Range Status   Specimen Description   Final    BLOOD SITE NOT SPECIFIED Performed at [facility], [address] [place], [place] [address]    Special Requests   Final    BOTTLES DRAWN AEROBIC AND ANAEROBIC Blood Culture adequate volume Performed at [facility], [address], [place], [place] [address]    Culture   Final    NO GROWTH 5 DAYS Performed at [facility], [address], [place], [place] [address]    Report Status 12/17/2022 FINAL  Final  SARS Coronavirus 2 by RT PCR (hospital order, performed in [facility] hospital lab) *cepheid single result test* Anterior Nasal Swab     Status: None   Collection Time:  12/12/22  8:32 AM   Specimen: Anterior Nasal Swab  Result Value Ref Range Status   SARS Coronavirus 2 by RT PCR NEGATIVE NEGATIVE Final    Comment: (NOTE) SARS-CoV-2 target nucleic acids are NOT DETECTED.  The SARS-CoV-2 RNA is generally detectable in upper and lower respiratory specimens during the acute phase of infection. The lowest concentration of SARS-CoV-2 viral copies this assay can detect is 250 copies /  mL. A negative result does not preclude SARS-CoV-2 infection and should not be used as the sole basis for treatment or other patient management decisions.  A negative result may occur with improper specimen collection / handling, submission of specimen other than nasopharyngeal swab, presence of viral mutation(s) within the areas targeted by this assay, and inadequate number of viral copies (<250 copies / mL). A negative result must be combined with clinical observations, patient history, and epidemiological information.  Fact Sheet for Patients:   RoadLapTop.co.za  Fact Sheet for Healthcare Providers: http://kim-miller.com/  This test is not yet approved or  cleared by the [place] FDA and has been authorized for detection and/or diagnosis of SARS-CoV-2 by FDA under an Emergency Use Authorization (EUA).  This EUA will remain in effect (meaning this test can be used) for the duration of the COVID-19 declaration under Section 564(b)(1) of the Act, 21 U.S.C. section 360bbb-3(b)(1), unless the authorization is terminated or revoked sooner.  Performed at [facility], [address], [place], [place] [address]   Aerobic/Anaerobic Culture w Gram Stain (surgical/deep wound)     Status: None (Preliminary result)   Collection Time: 12/14/22 10:17 AM   Specimen: Liver; Abscess  Result Value Ref Range Status   Specimen Description   Final    LIVER ABSCESS Performed at [facility], [address] [place], [place] [address]    Special Requests   Final    NONE Performed at [facility], [address] [place], [place] [address]    Gram Stain NO WBC SEEN NO ORGANISMS SEEN   Final   Culture   Final    FEW STREPTOCOCCUS CONSTELLATUS Beta hemolytic streptococci are predictably susceptible to penicillin and other beta lactams. Susceptibility testing not routinely performed. HOLDING FOR POSSIBLE ANAEROBE Performed at [facility], [address] [place], [place] [address]    Report Status PENDING  Incomplete  MRSA Next Gen by PCR, Nasal     Status: None   Collection Time: 12/14/22  5:08 PM   Specimen: Nasal Mucosa; Nasal Swab  Result Value Ref Range Status   MRSA by PCR Next Gen NOT DETECTED NOT DETECTED Final    Comment: (NOTE) The GeneXpert MRSA Assay (FDA approved for NASAL specimens only), is one component of a comprehensive MRSA colonization surveillance program. It is not intended to diagnose MRSA infection nor to guide or monitor treatment for MRSA infections. Test performance is not FDA approved in patients less than 35 years old. Performed at [facility], [address] [place], [place] [address]   Culture, blood (Routine X 2) w Reflex to ID Panel     Status: None (Preliminary result)   Collection Time: 12/17/22  9:16 AM   Specimen: BLOOD  Result Value Ref Range Status   Specimen Description   Final    BLOOD SITE NOT SPECIFIED Performed at [facility], [address] [place], [place] [address]    Special Requests   Final    BOTTLES DRAWN AEROBIC AND ANAEROBIC Blood Culture adequate volume Performed at [facility], [address] [place] [address], [place], [place] [address]    Culture   Final    NO GROWTH 3 DAYS Performed at [facility], [address], [place], [place] [address]    Report Status PENDING  Incomplete  Culture, blood (Routine X 2) w Reflex to ID Panel      Status: None (Preliminary result)  Collection Time: 12/17/22  9:16 AM   Specimen: BLOOD  Result Value Ref Range Status   Specimen Description   Final    BLOOD SITE NOT SPECIFIED Performed at [facility], [address] [place], [place] [address]    Special Requests   Final    BOTTLES DRAWN AEROBIC AND ANAEROBIC Blood Culture adequate volume Performed at [facility], [address] [place], [place] [address]    Culture   Final    NO GROWTH 3 DAYS Performed at [facility], [address], [place], [place] [address]    Report Status PENDING  Incomplete         Radiology Studies: IR Catheter Tube Change  Result Date: 12/19/2022 CLINICAL DATA:  52 year old male with history of hepatic abscess status post percutaneous drain placement on 12/14/2022 with diminished draining and CT evidence of suboptimal positioning. EXAM: IR CATHETER TUBE CHANGE COMPARISON:  12/14/2022, 12/17/2022 CONTRAST:  5 mL Omnipaque 300-administered via the percutaneous drainage catheter. MEDICATIONS: None. ANESTHESIA/SEDATION: None FLUOROSCOPY TIME:  Eight mGy TECHNIQUE: Patient was positioned prone on the fluoroscopy table. The external portion of the existing percutaneous drainage catheter as well as the surrounding skin was prepped and draped in usual sterile fashion. A preprocedural spot fluoroscopic image was obtained of the existing percutaneous drainage catheter. A small amount of contrast was injected via the existing percutaneous drainage catheter and several fluoroscopic images were obtained in various obliquities. The external portion of the percutaneous drainage catheter was cut and cannulated with a short Amplatz wire. Under intermittent fluoroscopic guidance, the existing percutaneous drainage catheter was exchanged for a new 10 French percutaneous drainage catheter with end coiled and locked within the more superior aspect of the decompressed abscess cavity.  Contrast injection confirmed appropriate position functionality of the percutaneous drainage catheter. The percutaneous drainage catheter was connected to a bulb suction and secured in place within interrupted suture and a StatLock device. A dressing was applied. The patient tolerated the procedure well without immediate postprocedural complication. IMPRESSION: Technically successful hepatic abscess drain exchange repositioning to more superior location within the abscess. PLAN: Interventional radiology will follow.  Keep to bulb suction for now. Marliss Coots, MD [facility] Radiology Electronically Signed   By: Marliss Coots M.D.   On: 12/19/2022 15:06        Scheduled Meds:  amoxicillin-clavulanate  1 tablet Oral Q12H   bisacodyl  10 mg Rectal Once   FLUoxetine  10 mg Oral Daily   insulin aspart  0-15 Units Subcutaneous TID WC   insulin aspart  0-5 Units Subcutaneous QHS   mometasone-formoterol  2 puff Inhalation BID   nicotine  14 mg Transdermal Daily   pantoprazole  40 mg Oral QHS   senna-docusate  1 tablet Oral QHS   sodium chloride flush  5 mL Intracatheter Q8H   Continuous Infusions:     LOS: 8 days    Time spent: 35 minutes    Ramiro Harvest, MD [facility]   To contact the attending provider between 7A-7P or the covering provider during after hours 7P-7A, please log into the web site [email or web address] and access using universal [place] password for that web site. If you do not have the password, please call the hospital operator.  12/20/2022, 12:28 PM

## 2022-12-20 NOTE — Discharge Instructions (Signed)
Interventional Radiology Percutaneous Abscess Drain Placement After Care   This sheet gives you information about how to care for yourself after your procedure. Your health care provider may also give you more specific instructions. Your drain was placed by an interventional radiologist with [facility]. If you have questions or concerns, contact [facility] at [phone].   What is a percutaneous drain?   A drain is a small plastic tube (catheter) that goes into the fluid collection in your body through your skin.   How long will I need the drain?   How long the drain needs to stay in is determined by where the drain is, how much comes out of the drain each day and if you are having any other surgical procedures.   Interventional radiology will determine when it is time to remove the drain. It is important to follow up as directed so that the drain can be removed as soon as it is safe to do so.   What can I expect after the procedure?   After the procedure, it is common to have:   A small amount of bruising and discomfort in the area where the drainage tube (catheter) was placed.   Sleepiness and fatigue. This should go away after the medicines you were given have worn off.   Follow these instructions at home:   Insertion site care   Check your insertion site when you change the bandage. Check for:   More redness, swelling, or pain.   More fluid or blood.   Warmth.   Pus or a bad smell.   When caring for your insertion site:   Wash your hands with soap and water for at least 20 seconds before and after you change your bandage (dressing). If soap and water are not available, use hand sanitizer.   You do not need to change your dressing everyday if it is clean and dry. Change your dressing every 3 days or as needed when it is soiled, wet or becoming dislodged. You will need to change your dressing each time you shower.   Leave stitches (sutures), skin  glue, or adhesive strips in place. These skin closures may need to stay in place for 2 weeks or longer. If adhesive strip edges start to loosen and curl up, you may trim the loose edges. Do not remove adhesive strips completely unless your health care provider tells you to do so.    Catheter care   Flush the catheter once per day with 5 mL of 0.9% normal saline unless you are told otherwise by your healthcare provider. This helps to prevent clogs in the catheter.   To disconnect the drain, turn the clear plastic tube to the left. Attach the saline syringe by placing it on the white end of the drain and turning gently to the right. Once attached gently push the plunger to the 5 mL mark. After you are done flushing, disconnect the syringe by turning to the left and reattach your drainage container    If you have a bulb please be sure the bulb is charged after reconnecting it - to do this pinch the bulb between your thumb and first finger and close the stopper located on the top of the bulb.     Check for fluid leaking from around your catheter (instead of fluid draining through your catheter). This may be a sign that the drain is no longer working correctly.   Write down the following information every time  you empty your bag:   The date and time.   The amount of drainage.   Activity   Rest at home for 1-2 days after your procedure.   For the first 48 hours do not lift anything more than 10 lbs (about a gallon of milk). You may perform moderate activities/exercise. Please avoid strenuous activities during this time.   Avoid any activities which may pull on your drain as this can cause your drain to become dislodged.   If you were given a sedative during the procedure, it can affect you for several hours. Do not drive or operate machinery until your health care provider says that it is safe.   General instructions   For mild pain take over-the-counter medications as needed for pain such  as Tylenol or Advil. If you are experiencing severe pain please call our office as this may indicate an issue with your drain.    If you were prescribed an antibiotic medicine, take it as told by your health care provider. Do not stop using the antibiotic even if you start to feel better.   You may shower 24 hours after the drain is placed. To do this cover the insertion site with a water tight material such as saran wrap and seal the edges with tape, you may also purchase waterproof dressings at your local drug store. Shower as usual and then remove the water tight dressing and any gauze/tape underneath it once you have exited the shower and dried off. Allow the area to air dry or pat dry with a clean towel. Once the skin is completely dry place a new gauze dressing. It is important to keep the site dry at all times to prevent infection.   Do not submerge the drain - this means you cannot take baths, swim, use a hot tub, etc. until the drain is removed.    Do not use any products that contain nicotine or tobacco, such as cigarettes, e-cigarettes, and chewing tobacco. If you need help quitting, ask your health care provider.   Keep all follow-up visits as told by your health care provider. This is important.   Contact a health care provider if:   You have less than 10 mL of drainage a day for 2-3 days in a row, or as directed by your health care provider.   You have any of these signs of infection:   More redness, swelling, or pain around your incision area.   More fluid or blood coming from your incision area.   Warmth coming from your incision area.   Pus or a bad smell coming from your incision area.   You have fluid leaking from around your catheter (instead of through your catheter).   You are unable to flush the drain.   You have a fever or chills.   You have pain that does not get better with medicine.   You have not been contacted to schedule a drain follow up appointment  within 10 days of discharge from the hospital.   Please call [facility] at [phone] with any questions or concerns.   Get help right away if:   Your catheter comes out.   You suddenly stop having drainage from your catheter.   You suddenly have blood in the fluid that is draining from your catheter.   You become dizzy or you faint.   You develop a rash.   You have nausea or vomiting.   You have difficulty breathing or  you feel short of breath.   You develop chest pain.   You have problems with your speech or vision.   You have trouble balancing or moving your arms or legs.   Summary   It is common to have a small amount of bruising and discomfort in the area where the drainage tube (catheter) was placed. You may also have minor discomfort with movement while the drain is in place.   Flush the drain once per day with 5 mL of 0.9% normal saline (unless you were told otherwise by your healthcare provider).    Record the amount of drainage from the bag every time you empty it.   Change the dressing every 3 days or earlier if soiled/wet. Keep the skin dry under the dressing.   You may shower with the drain in place. Do not submerge the drain (no baths, swimming, hot tubs, etc.).   Contact [facility] at [phone] if you have more redness, swelling, or pain around your incision area or if you have pain that does not get better with medicine.   This information is not intended to replace advice given to you by your health care provider. Make sure you discuss any questions you have with your health care provider.   Document Revised: 06/14/2021 Document Reviewed: 03/06/2019   [employer]vier Patient Education  2023 [employer]     Interventional Radiology Drain Record   Empty your drain at least once per day. You may empty it as often as needed. Use this form to write down the amount of fluid that has collected in the drainage container. Bring this  form with you to your follow-up visits. Please call [facility] at [phone] with any questions or concerns prior to your appointment.   Drain #1 location: ___________________   Date __________ Time __________ Amount __________   Date __________ Time __________ Amount __________   Date __________ Time __________ Amount __________   Date __________ Time __________ Amount __________   Date __________ Time __________ Amount __________   Date __________ Time __________ Amount __________   Date __________ Time __________ Amount __________   Date __________ Time __________ Amount __________   Date __________ Time __________ Amount __________   Date __________ Time __________ Amount __________   Date __________ Time __________ Amount __________   Date __________ Time __________ Amount __________   Date __________ Time __________ Amount __________   Date __________ Time __________ Amount __________

## 2022-12-20 NOTE — Progress Notes (Signed)
[facility]  Date of Admission:  12/11/2022   Total days of inpatient antibiotics 8  Principal Problem:   Sepsis (HCC) Active Problems:   Thrombocytopenia, unspecified (HCC)   Hepatic abscess   Common biliary duct stricture   Pancytopenia (HCC)   Portal vein thrombosis   Tobacco abuse   Splenomegaly   Chronic obstructive pulmonary disease (HCC)   Gastroesophageal reflux disease   Type 2 diabetes mellitus without complication, without long-term current use of insulin (HCC)   Constipation          Assessment: 52 YM with History of alcoholic cirrhosis with prior Hx of liver abscess  treated with augmentin x 72month->cipro+ flagyl (length?) in 2015-2016 admitted with:  #Liver abscess status post drain placement cultures growing strep constellatus on 9/21 c/b enlarging liver abscess status post drain exchange on 9/26 - Presented 4 to 5 days of abdominal pain.  Patient states he was taking medication as he started a new job - CT abdomen pelvis shows 4.5X3.4X 7.6 cm hepatic abscess. - IR engaged patient had drain placed, noted purulent fluid analysis aspiration cultures grew strep constellatus - Patient was transition to Augmentin on 92/23 take also fevered on 12/16/2022 - Repeat CT on 9/24 showed enlarging hepatic abscess.  ID engaged. - On 9/26 patient underwent drain exchange on 9/26 with 10 French catheter to more superior location with an abscess.  Cultures taken.  Recommendations: - Discontinue Unasyn - Start Augmentin 875/125 mg po bid for 4 weeks from drain placement on 9/27, EOT 10/24. - Follow-up with ID on 10/15 with Dr. Elinor Parkinson to review labs and assess if antibiotics need to be extended. - Follow repeat blood cultures from 9/24    #1/4 bottles blood cultures growing GPR - Follow-up blood cultures on 9/19 growing 1/4 GPR(sent to [facility] for further identification) in anaerobic bottle only with PCP.  I suspect that Augmentin will  cover.  Of note repeat blood cultures on 9/24 have been no growth so far.  #DM AIC 8.4 on 12/12/2022 -Management per primary     ID will sign off     Microbiology:   Antibiotics: Pip-tazo 9/19-9/23 Augmentin 9/23 Unasyn 9/24-   Cultures: Blood 9/19 1 out of 4 bottles GPR cultures (sent to [facility] for ID)reincubating 9/24 NG x 3 days  Other 9/21 liver abscess strep constellatus    SUBJECTIVE: Resting in bed. No new complaints Interval: Afebrile overnight. Wbc 7k  Review of Systems: Review of Systems  All other systems reviewed and are negative.    Scheduled Meds:  amoxicillin-clavulanate  1 tablet Oral Q12H   bisacodyl  10 mg Rectal Once   FLUoxetine  10 mg Oral Daily   insulin aspart  0-15 Units Subcutaneous TID WC   insulin aspart  0-5 Units Subcutaneous QHS   mometasone-formoterol  2 puff Inhalation BID   nicotine  14 mg Transdermal Daily   pantoprazole  40 mg Oral QHS   senna-docusate  1 tablet Oral QHS   sodium chloride flush  5 mL Intracatheter Q8H   Continuous Infusions: PRN Meds:.acetaminophen, HYDROcodone-acetaminophen, ipratropium-albuterol, methocarbamol, ondansetron **OR** ondansetron (ZOFRAN) IV, mouth rinse, polyethylene glycol No Known Allergies  OBJECTIVE: Vitals:   12/19/22 1957 12/19/22 2019 12/20/22 0530 12/20/22 0734  BP: 128/88  (!) 127/93   Pulse: 94  80   Resp: 18  20   Temp: 98.3 F (36.8 C)  98.8 F (37.1 C)   TempSrc: Oral  Oral  SpO2: 97% 97% 96% 97%  Weight:      Height:       Body mass index is 20.41 kg/m.  Physical Exam Constitutional:      General: He is not in acute distress.    Appearance: He is normal weight. He is not toxic-appearing.  HENT:     Head: Normocephalic and atraumatic.     Right Ear: External ear normal.     Left Ear: External ear normal.     Nose: No congestion or rhinorrhea.     Mouth/Throat:     Mouth: Mucous membranes are moist.     Pharynx: Oropharynx is clear.  Eyes:     Extraocular  Movements: Extraocular movements intact.     Conjunctiva/sclera: Conjunctivae normal.     Pupils: Pupils are equal, round, and reactive to light.  Cardiovascular:     Rate and Rhythm: Normal rate and regular rhythm.     Heart sounds: No murmur heard.    No friction rub. No gallop.  Pulmonary:     Effort: Pulmonary effort is normal.     Breath sounds: Normal breath sounds.  Abdominal:     General: Abdomen is flat. Bowel sounds are normal.     Palpations: Abdomen is soft.     Comments: Right abd drain  Musculoskeletal:        General: No swelling. Normal range of motion.     Cervical back: Normal range of motion and neck supple.  Skin:    General: Skin is warm and dry.  Neurological:     General: No focal deficit present.     Mental Status: He is oriented to person, place, and time.  Psychiatric:        Mood and Affect: Mood normal.       Lab Results Lab Results  Component Value Date   WBC 7.0 12/20/2022   HGB 9.4 (L) 12/20/2022   HCT 29.3 (L) 12/20/2022   MCV 89.6 12/20/2022   PLT 84 (L) 12/20/2022    Lab Results  Component Value Date   CREATININE 0.81 12/20/2022   BUN 9 12/20/2022   NA 130 (L) 12/20/2022   K 4.5 12/20/2022   CL 102 12/20/2022   CO2 23 12/20/2022    Lab Results  Component Value Date   ALT 20 12/20/2022   AST 18 12/20/2022   ALKPHOS 335 (H) 12/20/2022   BILITOT 0.7 12/20/2022        Danelle Earthly, MD [facility] 12/20/2022, 10:51 AM   I have personally spent 52 minutes involved in face-to-face and non-face-to-face activities for this patient on the day of the visit. Professional time spent includes the following activities: Preparing to see the patient (review of tests), Obtaining and/or reviewing separately obtained history (admission/discharge record), Performing a medically appropriate examination and/or evaluation , Ordering medications/tests/procedures, referring and communicating  with other health care professionals, Documenting clinical information in the EMR, Independently interpreting results (not separately reported), Communicating results to the patient/family/caregiver, Counseling and educating the patient/family/caregiver and Care coordination (not separately reported).

## 2022-12-20 NOTE — Progress Notes (Signed)
Referring Physician(s): Thompson,D  Supervising Physician: Mir, Mauri Reading  Patient Status:  [facility] ([facility]) - In-pt  Chief Complaint:  Hepatic abscess  Subjective: Patient feeling better since hepatic abscess drain exchanged yesterday.  Denies fever, chills, worsening abdominal pain, nausea, vomiting.   Allergies: Patient has no known allergies.  Medications: Prior to Admission medications   Medication Sig Start Date End Date Taking? Authorizing Provider  dicyclomine (BENTYL) 20 MG tablet Take 20 mg by mouth in the morning and at bedtime.    [provider]  FLUoxetine (PROZAC) 10 MG tablet Take 10 mg by mouth daily. 12/07/22   [provider]  HYDROcodone-acetaminophen (NORCO) 7.5-325 MG tablet Take 1 tablet by mouth every 6 (six) hours as needed for moderate pain.    [provider]  metFORMIN (GLUCOPHAGE) 1000 MG tablet Take 1 tablet by mouth daily with breakfast. 10/07/22   [provider]  methocarbamol (ROBAXIN) 500 MG tablet Take 500 mg by mouth 3 (three) times daily. 09/23/22   [provider]  nicotine (NICODERM CQ - DOSED IN MG/24 HOURS) 21 mg/24hr patch Place 1 patch (21 mg total) onto the skin daily. 05/12/22   Almon Hercules, MD  pantoprazole (PROTONIX) 40 MG tablet Take 1 tablet (40 mg total) by mouth 2 (two) times daily for 30 days, THEN 1 tablet (40 mg total) daily. 05/12/22 12/12/22  Almon Hercules, MD  polyethylene glycol powder (MIRALAX) 17 GM/SCOOP powder Take 17 g by mouth 2 (two) times daily as needed for moderate constipation or mild constipation. 05/12/22   Almon Hercules, MD  propranolol (INDERAL) 20 MG tablet Take 1 tablet (20 mg total) by mouth 2 (two) times daily. 05/12/22 12/12/22  Almon Hercules, MD  spironolactone (ALDACTONE) 50 MG tablet Take 50 mg by mouth daily. 11/06/22   [provider]  SYMBICORT 160-4.5 MCG/ACT inhaler Inhale 2 puffs into the lungs 2 (two) times daily. 11/11/22   [provider]   triamcinolone cream (KENALOG) 0.5 % Apply topically. 08/29/22   [provider]     Vital Signs: BP 120/86 (BP Location: Right Arm)   Pulse 90   Temp 98.7 F (37.1 C) (Oral)   Resp 20   Ht 5\' 8"  (1.727 m)   Wt 134 lb 4.2 oz (60.9 kg)   SpO2 97%   BMI 20.41 kg/m   Physical Exam awake, alert.  Hepatic abscess drain intact, insertion site okay, not significantly tender, output 10 to 15 cc of slightly turbid blood-tinged fluid; drain irrigated with minimal return  Imaging: IR Catheter Tube Change  Result Date: 12/19/2022 CLINICAL DATA:  52 year old male with history of hepatic abscess status post percutaneous drain placement on 12/14/2022 with diminished draining and CT evidence of suboptimal positioning. EXAM: IR CATHETER TUBE CHANGE COMPARISON:  12/14/2022, 12/17/2022 CONTRAST:  5 mL Omnipaque 300-administered via the percutaneous drainage catheter. MEDICATIONS: None. ANESTHESIA/SEDATION: None FLUOROSCOPY TIME:  Eight mGy TECHNIQUE: Patient was positioned prone on the fluoroscopy table. The external portion of the existing percutaneous drainage catheter as well as the surrounding skin was prepped and draped in usual sterile fashion. A preprocedural spot fluoroscopic image was obtained of the existing percutaneous drainage catheter. A small amount of contrast was injected via the existing percutaneous drainage catheter and several fluoroscopic images were obtained in various obliquities. The external portion of the percutaneous drainage catheter was cut and cannulated with a short Amplatz wire. Under intermittent fluoroscopic guidance, the existing percutaneous drainage catheter was exchanged for a new 10  French percutaneous drainage catheter with end coiled and locked within the more superior aspect of the decompressed abscess cavity. Contrast injection confirmed appropriate position functionality of the percutaneous drainage catheter. The percutaneous drainage catheter was connected to a  bulb suction and secured in place within interrupted suture and a StatLock device. A dressing was applied. The patient tolerated the procedure well without immediate postprocedural complication. IMPRESSION: Technically successful hepatic abscess drain exchange repositioning to more superior location within the abscess. PLAN: Interventional radiology will follow.  Keep to bulb suction for now. Marliss Coots, MD [facility] Electronically Signed   By: Marliss Coots M.D.   On: 12/19/2022 15:06   CT ABDOMEN PELVIS W CONTRAST  Result Date: 12/17/2022 CLINICAL DATA:  Intraabdominal abscess EXAM: CT ABDOMEN AND PELVIS WITH CONTRAST TECHNIQUE: Multidetector CT imaging of the abdomen and pelvis was performed using the standard protocol following bolus administration of intravenous contrast. RADIATION DOSE REDUCTION: This exam was performed according to the departmental dose-optimization program which includes automated exposure control, adjustment of the mA and/or kV according to patient size and/or use of iterative reconstruction technique. CONTRAST:  OMNIPAQUE IOHEXOL 300 MG/ML  SOLN COMPARISON:  CT abdomen and pelvis 12/12/2022 FINDINGS: Lower chest: There are small bilateral pleural effusions, right greater than left, which are new from prior. There is new atelectasis in the bilateral lung bases. Hepatobiliary: Heterogeneous multi-septated peripherally enhancing low-attenuation area in the liver is again seen. There is a new percutaneous drainage catheter in the inferior portion. Overall size has increased now measuring 8.0 x 4.9 x 11.0 cm (previously 4.5 x 3.4 x 7.6 cm). Note is made of a new linear tract measuring 5.5 cm in length containing air and fluid likely secondary to needle/procedure located anterior to this lesion. There is stable intrahepatic biliary ductal dilatation. The gallbladder is unremarkable. Pancreas: There is diffuse pancreatic  atrophy, ductal dilatation in calcifications compatible with chronic pancreatitis. Pancreatic head stent is unchanged in position. Spleen: The spleen is moderately enlarged and unchanged in size. Diffuse heterogeneity of the spleen may be related to phase of contrast. Adrenals/Urinary Tract: There is no hydronephrosis or perinephric stranding. Bilateral renal cysts are unchanged. Bladder and adrenal glands are within normal limits. Stomach/Bowel: Stomach is within normal limits. No evidence of bowel wall thickening, distention, or inflammatory changes. The appendix is not seen. There is a large amount of stool throughout the colon. Vascular/Lymphatic: Aorta and IVC are normal in size. 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 No lymphadenopathy identified. Reproductive: Prostate is unremarkable. Other: There small fat containing inguinal hernias. There is new small amount of ascites in the abdomen and pelvis. There is new body wall edema. Musculoskeletal: No fracture is seen. IMPRESSION: 1. Interval placement of percutaneous drainage catheter in the inferior portion of the liver abscess. Overall size of the abscess has increased in size. 2. New linear tract containing air and fluid anterior to the abscess likely secondary to needle/procedure. 3. New small bilateral pleural effusions with bibasilar atelectasis. 4. New small amount of ascites and body wall edema. 5. Stable findings of chronic pancreatitis and pancreatic stent. 6. Stable splenomegaly. 7. 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 8. Large amount of stool throughout the colon. Electronically Signed   By: Darliss Cheney M.D.   On: 12/17/2022 16:53    Labs:  CBC: Recent Labs    12/17/22 0916 12/18/22  4098 12/19/22 0447 12/20/22 0454  WBC 14.5* 7.5 5.7 7.0  HGB 9.9* 9.4* 9.6* 9.4*  HCT 29.6* 29.3* 29.5* 29.3*   PLT 78* 69* 75* 84*    COAGS: Recent Labs    05/10/22 0357 05/12/22 0428 05/13/22 0442 12/13/22 0608 12/19/22 0447  INR 1.2 1.2 1.3* 1.4* 1.2  APTT 28  --  41*  --   --     BMP: Recent Labs    12/17/22 0916 12/18/22 0519 12/19/22 0447 12/20/22 0454  NA 130* 135 135 130*  K 3.2* 3.8 4.4 4.5  CL 98 105 105 102  CO2 21* 23 26 23   GLUCOSE 135* 150* 201* 208*  BUN 10 8 10 9   CALCIUM 7.5* 7.5* 7.9* 7.5*  CREATININE 0.70 0.69 0.77 0.81  GFRNONAA >60 >60 >60 >60    LIVER FUNCTION TESTS: Recent Labs    12/17/22 0916 12/18/22 0519 12/19/22 0447 12/20/22 0454  BILITOT 1.3* 1.1 0.8 0.7  AST 20 15 14* 18  ALT 29 23 21 20   ALKPHOS 320* 275* 276* 335*  PROT 6.2* 5.8* 6.4* 6.2*  ALBUMIN 2.0* 1.8* 1.9* 1.9*    Assessment and Plan: 52 year old male with a complex history of cholangitis, choledocholithiasis, biliary stricture with stent placement, chronic pancreatitis 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, 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, s/p variceal ligation, ETOH cirrhosis, SMV thrombus, hepatic abscess 2015, splenomegaly, and recent FUO from 09/19/2022 - 09/23/2022 admitted for complaints of fever, RUQ pain and epigastric pain.+ family hx liver cancer in father ; status post drainage of right hepatic abscess/complex cystic mass on 9/21(10 fr to JP) ; drain fluid cultures with few Streptococcus constellatus and abundant Peptostreptococcus; drain fluid cytology negative; recent follow-up CT revealed suboptimal placement of CT drain in hepatic abscess therefore patient underwent exchange on 9/25; currently afebrile, WBC normal, hemoglobin stable, creatinine normal, latest blood cultures negative to date; if patient discharged over weekend recommend once daily irrigation of drain with 5 cc sterile saline, output recording and gauze dressing change every 2 to 3 days.  Patient instructed on how to irrigate drain.  We will schedule patient for follow-up  in IR clinic in 10 to 14 days.   Electronically Signed: D. Jeananne [name], PA-C 12/20/2022, 5:20 PM   I spent a total of 15 Minutes at the the patient's bedside AND on the patient's hospital floor or unit, greater than 50% of which was counseling/coordinating care for hepatic abscess drain    Patient ID: Lawrence Brock, male   DOB: 1970/10/13, 52 y.o.   MRN: [id number]

## 2022-12-20 NOTE — Progress Notes (Signed)
Home Care for Hepatic Abscess Drain that Patient will be discharged done with Patient and Patient's Daughter via phone viewing demonstration of care of drain. Teaching encouraging good hand hygiene and wearing gloves. Instructions given to clean the flush port prior to NS flush. Instructions also given how to empty JP and recharge the bulb.  Patient's Wife arrived to room and teaching done with the Patient's Wife in the room.  Radiology PA has also been to see the Patient today and teaching was done. Prescription for NS Flushes from the Radiology PA was left in the room and the RN gave the Prescription to the Wife. Wife and Daughter verbalized understanding of the teaching and teaching will be reinforced.

## 2022-12-21 ENCOUNTER — Other Ambulatory Visit (HOSPITAL_COMMUNITY): Payer: Self-pay

## 2022-12-21 DIAGNOSIS — K5900 Constipation, unspecified: Secondary | ICD-10-CM | POA: Diagnosis not present

## 2022-12-21 DIAGNOSIS — E871 Hypo-osmolality and hyponatremia: Secondary | ICD-10-CM

## 2022-12-21 DIAGNOSIS — A408 Other streptococcal sepsis: Secondary | ICD-10-CM | POA: Diagnosis not present

## 2022-12-21 DIAGNOSIS — Z720 Tobacco use: Secondary | ICD-10-CM

## 2022-12-21 DIAGNOSIS — D61818 Other pancytopenia: Secondary | ICD-10-CM

## 2022-12-21 DIAGNOSIS — K750 Abscess of liver: Secondary | ICD-10-CM | POA: Diagnosis not present

## 2022-12-21 LAB — BASIC METABOLIC PANEL
Anion gap: 7 (ref 5–15)
BUN: 10 mg/dL (ref 6–20)
CO2: 23 mmol/L (ref 22–32)
Calcium: 8 mg/dL — ABNORMAL LOW (ref 8.9–10.3)
Chloride: 104 mmol/L (ref 98–111)
Creatinine, Ser: 0.73 mg/dL (ref 0.61–1.24)
GFR, Estimated: >60 mL/min (ref >60)
Glucose, Bld: 146 mg/dL — ABNORMAL HIGH (ref 70–99)
Potassium: 4.1 mmol/L (ref 3.5–5.1)
Sodium: 134 mmol/L — ABNORMAL LOW (ref 135–145)

## 2022-12-21 LAB — CBC
HCT: 30 % — ABNORMAL LOW (ref 39.0–52.0)
Hemoglobin: 9.8 g/dL — ABNORMAL LOW (ref 13.0–17.0)
MCH: 29.1 pg (ref 26.0–34.0)
MCHC: 32.7 g/dL (ref 30.0–36.0)
MCV: 89 fL (ref 80.0–100.0)
Platelets: 92 10*3/uL — ABNORMAL LOW (ref 150–400)
RBC: 3.37 MIL/uL — ABNORMAL LOW (ref 4.22–5.81)
RDW: 13.7 % (ref 11.5–15.5)
WBC: 7.3 10*3/uL (ref 4.0–10.5)
nRBC: 0 % (ref 0.0–0.2)

## 2022-12-21 LAB — GLUCOSE, CAPILLARY
Glucose-Capillary: 144 mg/dL — ABNORMAL HIGH (ref 70–99)
Glucose-Capillary: 279 mg/dL — ABNORMAL HIGH (ref 70–99)

## 2022-12-21 MED ORDER — AMOXICILLIN-POT CLAVULANATE 875-125 MG PO TABS
1.0000 | ORAL_TABLET | Freq: Two times a day (BID) | ORAL | 0 refills | Status: AC
  Filled 2022-12-21: qty 56, 28d supply, fill #0

## 2022-12-21 NOTE — Discharge Summary (Signed)
Physician Discharge Summary  Kirubel Aja BJY:[id number] DOB: 12/01/1970 DOA: 12/11/2022  PCP: Verlon Au, MD  Admit date: 12/11/2022 Discharge date: 12/21/2022  Time spent: 60 minutes  Recommendations for Outpatient Follow-up:  Follow-up with Dr. Elinor Parkinson, ID on 01/07/2023 at 3:30 PM Follow-up with Verlon Au, MD in 2 weeks.  On follow-up patient need a basic metabolic profile done to follow-up on electrolytes and renal function.  Patient will need a CBC done to follow-up on counts.   Discharge Diagnoses:  Principal Problem:   Sepsis (HCC) Active Problems:   Thrombocytopenia, unspecified (HCC)   Tobacco abuse   Hepatic abscess   Common biliary duct stricture   Pancytopenia (HCC)   Portal vein thrombosis   Splenomegaly   Chronic obstructive pulmonary disease (HCC)   Gastroesophageal reflux disease   Type 2 diabetes mellitus without complication, without long-term current use of insulin (HCC)   Constipation   Discharge Condition: Stable and improved.  Diet recommendation: Carb modified diet  Filed Weights   12/12/22 0010 12/14/22 1655  Weight: 54.4 kg 60.9 kg    History of present illness:  HPI per Dr. Glorious Peach Burzynski is a 52 y.o. 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  Per patient, he started having right upper quadrant abdominal pain since 4 to 5 days, it is intermittent, crampy, aggravated with food, relieved with rest and not eating and has been anywhere from 7-10 out of 10 in intensity.  He is also having intermittent fever at home but denies any nausea, vomiting, diarrhea, constipation, any problem with urination, chills, sweating or any problem with bowel movements.   ED Course: Patient was febrile with Tmax of 101.2 and  tachycardic in the ED with leukocytosis.  Typically he has leukopenia.  He currently has thrombocytopenia which is at baseline.  Renal function at baseline.  Sodium 125.  Glucose 225.  Ultrasound abdomen negative for cholelithiasis or cholecystitis.  CT abdomen and pelvis with contrast hepatic abscess, hepatic bile duct dilatation and chronic pancreatitis.  Patient received 1 L of IV fluid bolus in the ED along with pain medication and a dose of Zosyn.  EDP discussed with Dr. Bernarda Caffey who is going to see in consultation.  Hospitalist were called for admission.  Hospital Course:  #1 sepsis secondary to multifocal hepatic abscesses, POA -Patient met criteria on admission for sepsis with fever, tachycardia, hypotension, CT scan findings concerning for multifocal abscesses. -Patient had been initially admitted to the stepdown unit treated with IV fluids and IV antibiotics with improvement with hypotension. -Patient initially on IV Zosyn and subsequently transitioned to Augmentin and subsequently to IV Unasyn. -Patient seen in consultation by general surgery who feel patient is not a candidate at this time for surgical intervention and recommended evaluation by IR for drain placement and IV antibiotics. -Patient also seen in consultation by GI, who recommended surgery and IR input due to patient's complex hepatobiliary with concerns for abscess formation. -Patient subsequently underwent CT-guided CT-guided drain placement on 12/14/2022 with preliminary cultures growing Streptococcus constellatus -Repeat CT abdomen and pelvis done 12/17/2022 with evidence of increasing/worsening abscess size. -Patient reassessed by IR who recommended image guided hepatic drain exchange/manipulation/upsize/possible additional hepatic drain placement to be done on 12/19/2022. -Patient subsequently underwent drain exchange with new drain reposition to the more superior aspect of the hepatic fluid collection. -  cultures growing  Streptococcus constellatus and Peptostreptococcus microbes. -Repeat blood cultures with no growth to date. -Patient was on IV Unasyn. -Patient seen in consultation by ID and IV Unasyn has been transitioned to Augmentin and ID recommending 4 weeks of treatment from drain placement on 9/27 with a EOT of 01/16/2023. -Will need ID follow-up which has been scheduled for 01/07/2023 with Dr. Elinor Parkinson. -Patient be discharged in stable and improved condition with outpatient follow-up with ID and IR in drain clinic.   2.  Type 2 diabetes mellitus, without complication, without long-term insulin use -Hemoglobin A1c noted at 8.4. -Home oral hypoglycemic agents were held and patient maintained on SSI during the hospitalization.    3.  GERD -Patient maintained on a PPI.     4.  COPD -Patient placed on Dulera as well as DuoNebs as needed.    5.  Constipation -Patient noted with large stool burden on CT abdomen and pelvis. -Patient denies feeling constipated states has been having regular bowel movements daily.   -Patient maintained on bowel regimen of Senokot-S nightly, MiraLAX daily as needed.   6.  Anemia/chronic thrombocytopenia -Stable. -Patient with no overt bleeding.   7.  Mood disorder -Patient maintained on home regimen of Prozac.     8.  Tobacco abuse -Tobacco cessation.      Procedures: CT abdomen and pelvis 12/12/2022, 12/17/2022 Abdominal ultrasound 12/12/2022 CT-guided placement of 10 French all-purpose drainage catheter into right hepatic fluid collection with aspiration of 15 mL of sanguinous, purulent fluid: Per IR: Dr. Elby Showers 12/14/2022 Fluoroscopic guided drain exchange with new drain repositioning a more superior aspect of hepatic fluid collection: Per IR: Dr. Elby Showers 12/19/2022  Consultations: Gastroenterology: Dr. Elnoria Howard 12/12/2022 General Surgery: Dr. Carolynne Edouard III 12/13/2022 ID: Dr. Thedore Mins 12/18/2022  Discharge Exam: Vitals:   12/20/22 2103 12/21/22 0502  BP: 117/84 126/85   Pulse: 85 82  Resp: 20 18  Temp: 99.1 F (37.3 C) 98.1 F (36.7 C)  SpO2: 97% 98%    General: NAD Cardiovascular: RRR no murmurs rubs or gallops.  No JVD.  No lower extremity edema. Respiratory: Clear to auscultation bilaterally.  No wheezes, no crackles, no rhonchi.  Fair air movement.  Speaking full sentences.  Discharge Instructions   Discharge Instructions     Diet Carb Modified   Complete by: As directed    Increase activity slowly   Complete by: As directed    No wound care   Complete by: As directed       Allergies as of 12/21/2022   No Known Allergies      Medication List     STOP taking these medications    propranolol 20 MG tablet Commonly known as: INDERAL   spironolactone 50 MG tablet Commonly known as: ALDACTONE       TAKE these medications    amoxicillin-clavulanate 875-125 MG tablet Commonly known as: AUGMENTIN Take 1 tablet by mouth every 12 (twelve) hours for 28 days.   dicyclomine 20 MG tablet Commonly known as: BENTYL Take 20 mg by mouth in the morning and at bedtime.   FLUoxetine 10 MG tablet Commonly known as: PROZAC Take 10 mg by mouth daily.   HYDROcodone-acetaminophen 7.5-325 MG tablet Commonly known as: NORCO Take 1 tablet by mouth every 6 (six) hours as needed for moderate pain.   metFORMIN 1000 MG tablet Commonly known as: GLUCOPHAGE Take 1 tablet by mouth daily with breakfast.   methocarbamol 500 MG tablet Commonly known as: ROBAXIN Take 500 mg by mouth 3 (  three) times daily.   nicotine 21 mg/24hr patch Commonly known as: NICODERM CQ - dosed in mg/24 hours Place 1 patch (21 mg total) onto the skin daily.   pantoprazole 40 MG tablet Commonly known as: Protonix Take 1 tablet (40 mg total) by mouth 2 (two) times daily for 30 days, THEN 1 tablet (40 mg total) daily. Start taking on: May 12, 2022   polyethylene glycol powder 17 GM/SCOOP powder Commonly known as: MiraLax Take 17 g by mouth 2 (two) times  daily as needed for moderate constipation or mild constipation.   Symbicort 160-4.5 MCG/ACT inhaler Generic drug: budesonide-formoterol Inhale 2 puffs into the lungs 2 (two) times daily.   triamcinolone cream 0.5 % Commonly known as: KENALOG Apply topically.       No Known Allergies  Follow-up Information     Verlon Au, MD. Schedule an appointment as soon as possible for a visit in 2 week(s).   Specialty: Family Medicine Contact information: [address] [facility] [place] [address] [phone]         Odette Fraction, MD Follow up on 01/07/2023.   Specialty: Infectious Diseases Why: Follow-up at 3:30 PM. Contact information: [address] [place] [address] [phone]                  The results of significant diagnostics from this hospitalization (including imaging, microbiology, ancillary and laboratory) are listed below for reference.    Significant Diagnostic Studies: IR Catheter Tube Change  Result Date: 12/19/2022 CLINICAL DATA:  52 year old male with history of hepatic abscess status post percutaneous drain placement on 12/14/2022 with diminished draining and CT evidence of suboptimal positioning. EXAM: IR CATHETER TUBE CHANGE COMPARISON:  12/14/2022, 12/17/2022 CONTRAST:  5 mL Omnipaque 300-administered via the percutaneous drainage catheter. MEDICATIONS: None. ANESTHESIA/SEDATION: None FLUOROSCOPY TIME:  Eight mGy TECHNIQUE: Patient was positioned prone on the fluoroscopy table. The external portion of the existing percutaneous drainage catheter as well as the surrounding skin was prepped and draped in usual sterile fashion. A preprocedural spot fluoroscopic image was obtained of the existing percutaneous drainage catheter. A small amount of contrast was injected via the existing percutaneous drainage catheter and several fluoroscopic images were obtained in various obliquities. The external  portion of the percutaneous drainage catheter was cut and cannulated with a short Amplatz wire. Under intermittent fluoroscopic guidance, the existing percutaneous drainage catheter was exchanged for a new 10 French percutaneous drainage catheter with end coiled and locked within the more superior aspect of the decompressed abscess cavity. Contrast injection confirmed appropriate position functionality of the percutaneous drainage catheter. The percutaneous drainage catheter was connected to a bulb suction and secured in place within interrupted suture and a StatLock device. A dressing was applied. The patient tolerated the procedure well without immediate postprocedural complication. IMPRESSION: Technically successful hepatic abscess drain exchange repositioning to more superior location within the abscess. PLAN: Interventional radiology will follow.  Keep to bulb suction for now. Marliss Coots, MD [facility] Electronically Signed   By: Marliss Coots M.D.   On: 12/19/2022 15:06   CT ABDOMEN PELVIS W CONTRAST  Result Date: 12/17/2022 CLINICAL DATA:  Intraabdominal abscess EXAM: CT ABDOMEN AND PELVIS WITH CONTRAST TECHNIQUE: Multidetector CT imaging of the abdomen and pelvis was performed using the standard protocol following bolus administration of intravenous contrast. RADIATION DOSE REDUCTION: This exam was performed according to the departmental dose-optimization program which includes automated exposure control,  adjustment of the mA and/or kV according to patient size and/or use of iterative reconstruction technique. CONTRAST:  OMNIPAQUE IOHEXOL 300 MG/ML  SOLN COMPARISON:  CT abdomen and pelvis 12/12/2022 FINDINGS: Lower chest: There are small bilateral pleural effusions, right greater than left, which are new from prior. There is new atelectasis in the bilateral lung bases. Hepatobiliary: Heterogeneous multi-septated peripherally enhancing  low-attenuation area in the liver is again seen. There is a new percutaneous drainage catheter in the inferior portion. Overall size has increased now measuring 8.0 x 4.9 x 11.0 cm (previously 4.5 x 3.4 x 7.6 cm). Note is made of a new linear tract measuring 5.5 cm in length containing air and fluid likely secondary to needle/procedure located anterior to this lesion. There is stable intrahepatic biliary ductal dilatation. The gallbladder is unremarkable. Pancreas: There is diffuse pancreatic atrophy, ductal dilatation in calcifications compatible with chronic pancreatitis. Pancreatic head stent is unchanged in position. Spleen: The spleen is moderately enlarged and unchanged in size. Diffuse heterogeneity of the spleen may be related to phase of contrast. Adrenals/Urinary Tract: There is no hydronephrosis or perinephric stranding. Bilateral renal cysts are unchanged. Bladder and adrenal glands are within normal limits. Stomach/Bowel: Stomach is within normal limits. No evidence of bowel wall thickening, distention, or inflammatory changes. The appendix is not seen. There is a large amount of stool throughout the colon. Vascular/Lymphatic: Aorta and IVC are normal in size. 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 No lymphadenopathy identified. Reproductive: Prostate is unremarkable. Other: There small fat containing inguinal hernias. There is new small amount of ascites in the abdomen and pelvis. There is new body wall edema. Musculoskeletal: No fracture is seen. IMPRESSION: 1. Interval placement of percutaneous drainage catheter in the inferior portion of the liver abscess. Overall size of the abscess has increased in size. 2. New linear tract containing air and fluid anterior to the abscess likely secondary to needle/procedure. 3. New small bilateral pleural effusions with bibasilar atelectasis. 4. New small amount of ascites and body wall  edema. 5. Stable findings of chronic pancreatitis and pancreatic stent. 6. Stable splenomegaly. 7. 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 8. Large amount of stool throughout the colon. Electronically Signed   By: Darliss Cheney M.D.   On: 12/17/2022 16:53   CT GUIDED PERITONEAL/RETROPERITONEAL FLUID DRAIN BY PERC CATH  Result Date: 12/14/2022 INDICATION: 52 year old male presenting with hepatic fluid collection and leukocytosis concerning for abscess. EXAM: CT PERC DRAIN PERITONEAL ABCESS COMPARISON:  None Available. MEDICATIONS: The patient is currently admitted to the hospital and receiving intravenous antibiotics. The antibiotics were administered within an appropriate time frame prior to the initiation of the procedure. ANESTHESIA/SEDATION: Moderate (conscious) sedation was employed during this procedure. A total of Versed 1.5 mg and Fentanyl 25 mcg was administered intravenously. Moderate Sedation Time: 5 minutes. The patient's level of consciousness and vital signs were monitored continuously by radiology nursing throughout the procedure under my direct supervision. CONTRAST:  None COMPLICATIONS: None immediate. PROCEDURE: RADIATION DOSE REDUCTION: This exam was performed according to the departmental dose-optimization program which includes automated exposure control, adjustment of the mA and/or kV according to patient size and/or use of iterative reconstruction technique. Informed written consent was obtained from the patient after a discussion of the risks, benefits and alternatives to treatment. The patient was placed in the left lateral decubitus position on the CT gantry and a pre procedural  CT was performed re-demonstrating the known abscess/fluid collection within the right lobe of the liver. The procedure was planned. A timeout was performed prior to the initiation of the procedure. The right posterior flank was prepped and draped  in the usual sterile fashion. The overlying soft tissues were anesthetized with 1% lidocaine with epinephrine. Appropriate trajectory was planned with the use of a 22 gauge spinal needle. An 18 gauge trocar needle was advanced into the abscess/fluid collection and a short Amplatz super stiff wire was coiled within the collection. Appropriate positioning was confirmed with a limited CT scan. The tract was serially dilated allowing placement of a 10 [place] all-purpose drainage catheter. Appropriate positioning was confirmed with a limited postprocedural CT scan. A proximally 15 ml of sanguinous, purulent fluid was aspirated. The tube was connected to a bulb suction and sutured in place. A dressing was placed. The patient tolerated the procedure well without immediate post procedural complication. IMPRESSION: Successful CT guided placement of a 10 French all purpose drain catheter into the right hepatic fluid collection with aspiration of proximally 15 mL of sanguinous, purulent fluid. Samples were sent to the laboratory as requested by the ordering clinical team. Marliss Coots, MD [facility] Electronically Signed   By: Marliss Coots M.D.   On: 12/14/2022 18:39   CT ABDOMEN PELVIS W CONTRAST  Result Date: 12/12/2022 CLINICAL DATA:  History of mass-forming chronic pancreatitis with three day history of right upper abdominal pain associated with fever EXAM: CT ABDOMEN AND PELVIS WITH CONTRAST TECHNIQUE: Multidetector CT imaging of the abdomen and pelvis was performed using the standard protocol following bolus administration of intravenous contrast. RADIATION DOSE REDUCTION: This exam was performed according to the departmental dose-optimization program which includes automated exposure control, adjustment of the mA and/or kV according to patient size and/or use of iterative reconstruction technique. CONTRAST:  OMNIPAQUE IOHEXOL 300 MG/ML  SOLN  COMPARISON:  MR abdomen dated 10/15/2022, CT abdomen and pelvis dated 09/19/2022, CTA abdomen and pelvis dated 05/10/2022 FINDINGS: Lower chest: No focal consolidation or pulmonary nodule in the lung bases. No pleural effusion or pneumothorax demonstrated. Partially imaged heart size is normal. Hepatobiliary: Interval development of multiloculated cystic focus centered within segment 7 measuring 4.5 x 3.4 x 7.6 cm (2:15, 7:88) With surrounding parenchymal hyperenhancement. Persistent intra hepatic bile duct dilation to the level of the portal confluence, where long segment narrowing of the common bile duct is again seen. Normal gallbladder. Pancreas: Sequela of chronic pancreatitis with diffuse parenchymal atrophy and bulky stone in the region of the pancreatic head. Spleen: Similar enlargement, 17.5 cm in the AP dimension. Adrenals/Urinary Tract: No adrenal nodules. No suspicious renal mass, calculi or hydronephrosis. Bilateral simple cysts. No specific follow-up imaging recommended. No focal bladder wall thickening. Stomach/Bowel: Normal appearance of the stomach. No evidence of bowel wall thickening, distention, or inflammatory changes. Appendix is not discretely seen. 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 Aortic atherosclerosis. No enlarged abdominal or pelvic lymph nodes. Reproductive: Prostate is unremarkable. Other: No free fluid, fluid collection, or free air. Musculoskeletal: No acute or abnormal lytic or blastic osseous lesions. IMPRESSION: 1. Interval development of multiloculated cystic focus centered within segment 7 measuring 4.5 x 3.4 x 7.6 cm with surrounding parenchymal hyperenhancement, suspicious for hepatic abscess. 2. Persistent intra hepatic bile duct dilation to the level of the portal confluence, where long segment narrowing of the common bile duct is again  seen. 3. Sequela of chronic pancreatitis  with diffuse parenchymal atrophy and bulky stone in the region of the pancreatic head. 4. Similar splenomegaly. 5. 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 6.  Aortic Atherosclerosis (ICD10-I70.0). Electronically Signed   By: Agustin Cree M.D.   On: 12/12/2022 10:39   US Abdomen Complete  Result Date: 12/12/2022 CLINICAL DATA:  Upper abdominal pain EXAM: ABDOMEN ULTRASOUND COMPLETE COMPARISON:  CT abdomen and pelvis 08/19/2016 FINDINGS: Gallbladder: No gallstones or wall thickening visualized. No sonographic Murphy sign noted by sonographer. Common bile duct: Diameter: 3.7 mm Liver: No focal lesion identified. Within normal limits in parenchymal echogenicity. Portal vein is patent on color Doppler imaging with normal direction of blood flow towards the liver. IVC: No abnormality visualized. Pancreas: Visualized portion unremarkable. Spleen: Size and appearance within normal limits. Right Kidney: Length: 9.6 cm. Echogenicity within normal limits. No mass or hydronephrosis visualized. Left Kidney: Length: 10.4 cm. Echogenicity within normal limits. No mass or hydronephrosis visualized. There is a 2.5 cm simple cyst left kidney. No imaging follow-up needed. Abdominal aorta: No aneurysm visualized. Other findings: None. IMPRESSION: No cholelithiasis or sonographic evidence for acute cholecystitis. Electronically Signed   By: Annia Belt M.D.   On: 12/12/2022 08:06    Microbiology: Recent Results (from the past 240 hour(s))  Blood culture (routine x 2)     Status: None (Preliminary result)   Collection Time: 12/12/22  8:04 AM   Specimen: BLOOD  Result Value Ref Range Status   Specimen Description   Final    BLOOD SITE NOT SPECIFIED Performed at [facility], [address] [place], [place] [address]    Special Requests   Final    BOTTLES DRAWN AEROBIC AND ANAEROBIC Blood Culture adequate volume Performed at [facility],  [address] [place], [place] [address]    Culture  Setup Time   Final    GRAM POSITIVE RODS ANAEROBIC BOTTLE ONLY CRITICAL RESULT CALLED TO, READ BACK BY AND VERIFIED WITH: PHARMD ELLEN JACKSON ON 12/13/22 @ 2315 BY DRT    Culture   Final    GRAM POSITIVE RODS SENT TO [facility] FOR IDENTIFICATION. Performed at [facility], [address], [place], [place] [address]    Report Status PENDING  Incomplete  Blood culture (routine x 2)     Status: None   Collection Time: 12/12/22  8:04 AM   Specimen: BLOOD  Result Value Ref Range Status   Specimen Description   Final    BLOOD SITE NOT SPECIFIED Performed at [facility], [address], [place], [place] [address]    Special Requests   Final    BOTTLES DRAWN AEROBIC AND ANAEROBIC Blood Culture adequate volume Performed at [facility], [address], [place], [place] [address]    Culture   Final    NO GROWTH 5 DAYS Performed at [facility], [address], [place], [place] [address]    Report Status 12/17/2022 FINAL  Final  SARS Coronavirus 2 by RT PCR (hospital order, performed in [facility] hospital lab) *cepheid single result test* Anterior Nasal Swab     Status: None   Collection Time: 12/12/22  8:32 AM   Specimen: Anterior Nasal Swab  Result Value Ref Range Status   SARS Coronavirus 2 by RT PCR NEGATIVE NEGATIVE Final    Comment: (NOTE) SARS-CoV-2 target nucleic acids are NOT DETECTED.  The  SARS-CoV-2 RNA is generally detectable in upper and lower respiratory specimens during the acute phase of infection. The lowest concentration of SARS-CoV-2 viral copies this assay can detect is 250 copies / mL. A negative result does not preclude SARS-CoV-2 infection and should not be used as the sole basis for treatment or other patient management decisions.  A negative result may occur with improper specimen collection / handling, submission of specimen other than nasopharyngeal  swab, presence of viral mutation(s) within the areas targeted by this assay, and inadequate number of viral copies (<250 copies / mL). A negative result must be combined with clinical observations, patient history, and epidemiological information.  Fact Sheet for Patients:   RoadLapTop.co.za  Fact Sheet for Healthcare Providers: http://kim-miller.com/  This test is not yet approved or  cleared by the [place] FDA and has been authorized for detection and/or diagnosis of SARS-CoV-2 by FDA under an Emergency Use Authorization (EUA).  This EUA will remain in effect (meaning this test can be used) for the duration of the COVID-19 declaration under Section 564(b)(1) of the Act, 21 U.S.C. section 360bbb-3(b)(1), unless the authorization is terminated or revoked sooner.  Performed at [facility], [address] [place], [place] [address]   Aerobic/Anaerobic Culture w Gram Stain (surgical/deep wound)     Status: None   Collection Time: 12/14/22 10:17 AM   Specimen: Liver; Abscess  Result Value Ref Range Status   Specimen Description   Final    LIVER ABSCESS Performed at [facility], [address] [place], [place] [address]    Special Requests   Final    NONE Performed at [facility], [address] [place], [place] [address]    Gram Stain NO WBC SEEN NO ORGANISMS SEEN   Final   Culture   Final    FEW STREPTOCOCCUS CONSTELLATUS Beta hemolytic streptococci are predictably susceptible to penicillin and other beta lactams. Susceptibility testing not routinely performed. ABUNDANT PEPTOSTREPTOCOCCUS MICROS Standardized susceptibility testing for this organism is not available. Performed at [facility], [address], [place], [place] [address]    Report Status 12/20/2022 FINAL  Final  MRSA Next Gen by PCR, Nasal     Status: None   Collection Time: 12/14/22  5:08 PM    Specimen: Nasal Mucosa; Nasal Swab  Result Value Ref Range Status   MRSA by PCR Next Gen NOT DETECTED NOT DETECTED Final    Comment: (NOTE) The GeneXpert MRSA Assay (FDA approved for NASAL specimens only), is one component of a comprehensive MRSA colonization surveillance program. It is not intended to diagnose MRSA infection nor to guide or monitor treatment for MRSA infections. Test performance is not FDA approved in patients less than 22 years old. Performed at [facility], [address] [place], [place] [address]   Culture, blood (Routine X 2) w Reflex to ID Panel     Status: None (Preliminary result)   Collection Time: 12/17/22  9:16 AM   Specimen: BLOOD  Result Value Ref Range Status   Specimen Description   Final    BLOOD SITE NOT SPECIFIED Performed at [facility], [address], [place], [place] [address]    Special Requests   Final    BOTTLES DRAWN AEROBIC AND ANAEROBIC Blood Culture adequate volume Performed at [facility], [address], [place], [place] [address]    Culture   Final    NO GROWTH 4 DAYS Performed  at [facility], [address], [place], [place] [address]    Report Status PENDING  Incomplete  Culture, blood (Routine X 2) w Reflex to ID Panel     Status: None (Preliminary result)   Collection Time: 12/17/22  9:16 AM   Specimen: BLOOD  Result Value Ref Range Status   Specimen Description   Final    BLOOD SITE NOT SPECIFIED Performed at [facility], [address] [place], [place] [address]    Special Requests   Final    BOTTLES DRAWN AEROBIC AND ANAEROBIC Blood Culture adequate volume Performed at [facility], [address], [place], [place] [address]    Culture   Final    NO GROWTH 4 DAYS Performed at [facility], [address], [place], [place] [address]    Report Status PENDING  Incomplete     Labs: Basic Metabolic  Panel: Recent Labs  Lab 12/17/22 0916 12/18/22 0519 12/19/22 0447 12/20/22 0454 12/21/22 0539  NA 130* 135 135 130* 134*  K 3.2* 3.8 4.4 4.5 4.1  CL 98 105 105 102 104  CO2 21* 23 26 23 23   GLUCOSE 135* 150* 201* 208* 146*  BUN 10 8 10 9 10   CREATININE 0.70 0.69 0.77 0.81 0.73  CALCIUM 7.5* 7.5* 7.9* 7.5* 8.0*  MG 1.9 2.1 2.0  --   --    Liver Function Tests: Recent Labs  Lab 12/16/22 0457 12/17/22 0916 12/18/22 0519 12/19/22 0447 12/20/22 0454  AST 23 20 15  14* 18  ALT 38 29 23 21 20   ALKPHOS 278* 320* 275* 276* 335*  BILITOT 1.2 1.3* 1.1 0.8 0.7  PROT 5.7* 6.2* 5.8* 6.4* 6.2*  ALBUMIN 1.8* 2.0* 1.8* 1.9* 1.9*   No results for input(s): "LIPASE", "AMYLASE" in the last 168 hours. No results for input(s): "AMMONIA" in the last 168 hours. CBC: Recent Labs  Lab 12/16/22 0457 12/17/22 0916 12/18/22 0519 12/19/22 0447 12/20/22 0454 12/21/22 0539  WBC 9.5 14.5* 7.5 5.7 7.0 7.3  NEUTROABS 8.5* 13.0* 6.3 4.9 5.8  --   HGB 10.3* 9.9* 9.4* 9.6* 9.4* 9.8*  HCT 31.7* 29.6* 29.3* 29.5* 29.3* 30.0*  MCV 89.0 85.3 88.5 88.3 89.6 89.0  PLT 55* 78* 69* 75* 84* 92*   Cardiac Enzymes: No results for input(s): "CKTOTAL", "CKMB", "CKMBINDEX", "TROPONINI" in the last 168 hours. BNP: BNP (last 3 results) No results for input(s): "BNP" in the last 8760 hours.  ProBNP (last 3 results) No results for input(s): "PROBNP" in the last 8760 hours.  CBG: Recent Labs  Lab 12/20/22 1129 12/20/22 1621 12/20/22 2104 12/21/22 0727 12/21/22 1138  GLUCAP 138* 129* 198* 144* 279*       Signed:  Ramiro Harvest MD.  [facility] 12/21/2022, 1:36 PM

## 2022-12-21 NOTE — TOC Transition Note (Signed)
Transition of Care [facility]) - CM/SW Discharge Note   Patient Details  Name: Lawrence Brock MRN: [id number] Date of Birth: 1970/04/19  Transition of Care [facility]) CM/SW Contact:  Adrian Prows, RN Phone Number: 12/21/2022, 1:55 PM   Clinical Narrative:    D/C orders received; no TOC needs.   Final next level of care: Home/Self Care Barriers to Discharge: No Barriers Identified   Patient Goals and CMS Choice      Discharge Placement                         Discharge Plan and Services Additional resources added to the After Visit Summary for                                       Social Determinants of Health (SDOH) Interventions SDOH Screenings   Food Insecurity: No Food Insecurity (12/12/2022)  Housing: Low Risk  (12/12/2022)  Transportation Needs: No Transportation Needs (12/12/2022)  Utilities: Not At Risk (12/12/2022)  Financial Resource Strain: Not on File (08/31/2018)   Received from [place], [place]  Physical Activity: Not on File (08/31/2018)   Received from [place], [place]  Social Connections: Unknown (08/03/2021)   Received from [facility]  Stress: Not on File (08/31/2018)   Received from [place], [place]  Tobacco Use: High Risk (12/15/2022)     Readmission Risk Interventions     No data to display

## 2022-12-21 NOTE — Progress Notes (Signed)
Drain care including dressing changes and flushing re-educated to patient and patient's family member. Verbalized understanding. Pt wife demonstrated drain flush with 5ml of NS with RN at bedside.   Iv removed. D/c paper work reviewed with patient and family member. All questions answered. Pt discharged with all personal belongings in stable condition.

## 2022-12-21 NOTE — Plan of Care (Signed)
  Problem: Pain Managment: Goal: General experience of comfort will improve Outcome: Progressing   Problem: Safety: Goal: Ability to remain free from injury will improve Outcome: Progressing   

## 2022-12-22 LAB — CULTURE, BLOOD (ROUTINE X 2)
Culture: NO GROWTH
Culture: NO GROWTH

## 2022-12-24 ENCOUNTER — Other Ambulatory Visit (HOSPITAL_COMMUNITY): Payer: Self-pay | Admitting: Radiology

## 2022-12-24 DIAGNOSIS — E871 Hypo-osmolality and hyponatremia: Secondary | ICD-10-CM

## 2022-12-24 DIAGNOSIS — K750 Abscess of liver: Secondary | ICD-10-CM

## 2022-12-25 LAB — MISC LABCORP TEST (SEND OUT): Labcorp test code: 182345

## 2022-12-29 LAB — CULTURE, BLOOD (ROUTINE X 2): Special Requests: ADEQUATE

## 2023-01-01 ENCOUNTER — Other Ambulatory Visit (HOSPITAL_COMMUNITY): Payer: Self-pay

## 2023-01-01 MED ORDER — NORMAL SALINE FLUSH 0.9 % IV SOLN
INTRAVENOUS | 3 refills | Status: DC
  Filled 2023-01-01: qty 300, 30d supply, fill #0

## 2023-01-07 ENCOUNTER — Encounter: Payer: Self-pay | Admitting: Infectious Diseases

## 2023-01-07 ENCOUNTER — Other Ambulatory Visit: Payer: Self-pay

## 2023-01-07 ENCOUNTER — Ambulatory Visit: Payer: BLUE CROSS/BLUE SHIELD | Admitting: Infectious Diseases

## 2023-01-07 VITALS — BP 119/83 | HR 85 | Temp 97.7°F | Wt 124.0 lb

## 2023-01-07 DIAGNOSIS — F1091 Alcohol use, unspecified, in remission: Secondary | ICD-10-CM

## 2023-01-07 DIAGNOSIS — K7030 Alcoholic cirrhosis of liver without ascites: Secondary | ICD-10-CM

## 2023-01-07 DIAGNOSIS — Z79899 Other long term (current) drug therapy: Secondary | ICD-10-CM

## 2023-01-07 DIAGNOSIS — K750 Abscess of liver: Secondary | ICD-10-CM | POA: Diagnosis not present

## 2023-01-07 NOTE — Progress Notes (Signed)
Patient Active Problem List   Diagnosis Date Noted   Hyponatremia 12/24/2022   Chronic obstructive pulmonary disease (HCC) 12/18/2022   Gastroesophageal reflux disease 12/18/2022   Type 2 diabetes mellitus without complication, without long-term current use of insulin (HCC) 12/18/2022   Constipation 12/18/2022   Sepsis (HCC) 12/12/2022   Portal hypertensive gastropathy (HCC) 05/12/2022   Hypotension 05/11/2022   Internal hemorrhoid 05/11/2022   Acute GI bleeding 05/11/2022   Acute upper GI bleeding 05/10/2022   Syncope 05/10/2022   Abnormal finding on GI tract imaging 05/10/2022   Hematochezia 05/10/2022   Acute blood loss anemia 05/10/2022   Lactic acidosis 05/10/2022   Lower abdominal pain 05/10/2022   Chest pain 05/10/2022   Alcohol abuse, in remission 05/10/2022   Tobacco abuse 05/10/2022   Alcoholism in remission (HCC) 02/01/2022   Splenomegaly 05/30/2021   History of esophageal varices 08/13/2019   Hematemesis with nausea 08/19/2016   Bleeding esophageal varices (HCC)    Anemia, iron deficiency    Anemia 06/26/2015   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/03/2015   Bleeding gastrointestinal 12/29/2014   Alcoholic cirrhosis of liver without ascites (HCC) 12/29/2014   Portal vein thrombosis 12/29/2014   CAP (community acquired pneumonia) 10/31/2014   Pancytopenia (HCC) 10/31/2014   Pancreatitis 10/31/2014   Cholangitis    Biliary obstruction 02/22/2014   Common biliary duct stricture 02/22/2014   Common bile duct stricture 02/18/2014   Choledocholithiasis 02/18/2014   Hepatic abscess 02/16/2014   Elevated LFTs 02/15/2014   Other specified fever 02/15/2014   Duodenal ulcer 09/14/2013   Fever 09/10/2013   Hypokalemia 09/09/2013   Thrombocytopenia, unspecified (HCC) 09/09/2013   Normocytic anemia 09/09/2013   Pancreatic pseudocyst 09/09/2013   Constipation 09/09/2013    Patient's Medications  New Prescriptions   No medications on file  Previous  Medications   AMOXICILLIN-CLAVULANATE (AUGMENTIN) 875-125 MG TABLET    Take 1 tablet by mouth every 12 (twelve) hours for 28 days.   DICYCLOMINE (BENTYL) 20 MG TABLET    Take 20 mg by mouth in the morning and at bedtime.   FLUOXETINE (PROZAC) 10 MG TABLET    Take 10 mg by mouth daily.   HYDROCODONE-ACETAMINOPHEN (NORCO) 7.5-325 MG TABLET    Take 1 tablet by mouth every 6 (six) hours as needed for moderate pain.   METFORMIN (GLUCOPHAGE) 1000 MG TABLET    Take 1 tablet by mouth daily with breakfast.   METHOCARBAMOL (ROBAXIN) 500 MG TABLET    Take 500 mg by mouth 3 (three) times daily.   NICOTINE (NICODERM CQ - DOSED IN MG/24 HOURS) 21 MG/24HR PATCH    Place 1 patch (21 mg total) onto the skin daily.   PANTOPRAZOLE (PROTONIX) 40 MG TABLET    Take 1 tablet (40 mg total) by mouth 2 (two) times daily for 30 days, THEN 1 tablet (40 mg total) daily.   POLYETHYLENE GLYCOL POWDER (MIRALAX) 17 GM/SCOOP POWDER    Take 17 g by mouth 2 (two) times daily as needed for moderate constipation or mild constipation.   SODIUM CHLORIDE FLUSH (NORMAL SALINE FLUSH) 0.9 % SOLN    Use 5 ml as directed to flush abdominal drain once daily.   SYMBICORT 160-4.5 MCG/ACT INHALER    Inhale 2 puffs into the lungs 2 (two) times daily.   TRIAMCINOLONE CREAM (KENALOG) 0.5 %    Apply topically.  Modified Medications   No medications on file  Discontinued Medications   No medications on file  Subjective: 27 Y O male with PMH of alcoholic liver cirrhosis s/w splenomegaly/thrombocytopenia/portal HTN/Varices, chronic pancreatitis, pancreatic mass,  prior h/o liver abscess treated with PO augmentin * 1 month > cipro+ metronidazole afterwards, HTN, COPD, DM PV thrombosis not on AC , tobacco use who is here for HFU for liver abscess. CT abdomen/pelvis 9/19 hepatic abscesses. Seen by General surgery, GI, IR and ID. . S/p CT guided drain placement 9/21 by IR with cx growing streptococcus constellatus. Repeat CT  9/24 with worsening abscess  size. This was followed by drain exchange 9/26 with 10 Fr catheter to more superior location. 9/21 blood cx 1/2 sets GPR, sent to [facility] to ID. Patient initially on IV abtx then transitioned to PO augmentin to complete 4 weeks course during discharge. EOT 10/24  01/07/23  He currently has a drain in place, which has not been producing any drainage for the past two days. He denies any abdominal pain, tenderness, or swelling at the site of the drain. He has been gaining weight since his hospital discharge, increasing from 109 to 125 pounds. He is currently taking po augmentin twice daily without any issues, although he reports his stool has been soft. Denies nausea, vomiting. Appetite is good. Reports he was tested for Hepatitis B and C when he traveled to [place] 2 years ago for his job and was tested negative. Reports he moved from [place] at the age of 11. Denies drinking alcohol for last 8 years. Denies IVDU.   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 pseudocyst in 08/2013. Follwed by Dr Rob Bunting and Rise Mu (at [facility]).     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    Hypertension    No meds yet.   Liver abscess 01/2014   Pneumonia    10-30-14 [facility] hospital admission, no problems now.   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    Thrombocytopenia (HCC) 2015   with splenomegaly 03/2014.  s/p bone marrow biopsy, nonspecific findings 09/2015   Past Surgical History:  Procedure Laterality Date   APPENDECTOMY     BIOPSY  05/11/2022   Procedure: BIOPSY;  Surgeon: Jenel Lucks, MD;  Location: [facility] ENDOSCOPY;  Service: Gastroenterology;;   COLONOSCOPY WITH  PROPOFOL N/A 01/05/2015   Rachael Fee, MD;  Thre 7 to 13 mm tubular adenomatous polyps removed. repeat colonoscopy suggested in 12/2017.     ENDOSCOPIC RETROGRADE CHOLANGIOPANCREATOGRAPHY (ERCP) WITH PROPOFOL N/A 06/02/2014   Dr Rob Bunting.  removed covered metal stent.  no stricture noted.  Rec: biliary diversion surgery if recurrent stricture arises.   ERCP N/A 02/22/2014   Meryl Dare, MD; with removal of plastic stent, replaced with metal biliary stent.  filling defect: stone, sludge, blood clots removed with baloon sweep.     ERCP N/A 02/18/2014   For suspected cholangitis. Louis Meckel, MD; s/p sphincterotomy, blood clots and sludge swept out, remnant of pancreatic duct noted (removal not mentioned.  plastic stent placed into CBD.     ERCP, plastic pancreatic duct stent placement.  2007   Dr Danny Lawless at [facility].    ESOPHAGEAL BANDING  05/11/2022  Procedure: ESOPHAGEAL BANDING;  Surgeon: Jenel Lucks, MD;  Location: [facility] ENDOSCOPY;  Service: Gastroenterology;;   ESOPHAGOGASTRODUODENOSCOPY N/A 05/11/2022   Procedure: ESOPHAGOGASTRODUODENOSCOPY (EGD);  Surgeon: Jenel Lucks, MD;  Location: [facility] ENDOSCOPY;  Service: Gastroenterology;  Laterality: N/A;   ESOPHAGOGASTRODUODENOSCOPY (EGD) WITH PROPOFOL N/A 09/14/2013   Dr Rhea Belton.  erosive gastritis.  bulbar ulcer.  removal of plastic biliaty stent.     ESOPHAGOGASTRODUODENOSCOPY (EGD) WITH PROPOFOL N/A 01/05/2015   for melena. Rachael Fee.  Large distal esophageal varices, no bleeding stigmata.  no gastric varices, not banded.  Moderate to severe portal hypertensive gastropathy.  Nadolol initiated.    ESOPHAGOGASTRODUODENOSCOPY (EGD) WITH PROPOFOL N/A 08/19/2016   Armbruster, Reeves Forth, MD; Large esophageal varices with red whale signs, high risk for bleeding. 11 bands placed.  Portal hypertensive gastropathy.    FLEXIBLE SIGMOIDOSCOPY N/A 05/11/2022   Procedure: FLEXIBLE SIGMOIDOSCOPY;  Surgeon: Jenel Lucks, MD;  Location: [facility] ENDOSCOPY;  Service: Gastroenterology;  Laterality: N/A;   GASTROINTESTINAL STENT REMOVAL N/A 09/14/2013   Beverley Fiedler, MD; Pancreatic stent removal   IR CATHETER TUBE CHANGE  12/19/2022     Social History   Tobacco Use   Smoking status: Every Day    Current packs/day: 0.50    Types: Cigarettes   Smokeless tobacco: Never  Substance Use Topics   Alcohol use: No    Alcohol/week: 12.0 standard drinks of alcohol    Types: 12 Cans of beer per week    Comment: former ETOH abuse, quit a month ago per patient   Drug use: No    Family History  Problem Relation Age of Onset   Hypertension Mother     No Known Allergies  Health Maintenance  Topic Date Due   FOOT EXAM  Never done   OPHTHALMOLOGY EXAM  Never done   Diabetic kidney evaluation - Urine ACR  Never done   Hepatitis C Screening  Never done   DTaP/Tdap/Td (1 - Tdap) Never done   INFLUENZA VACCINE  10/24/2022   COVID-19 Vaccine (3 - 2023-24 season) 11/24/2022   HEMOGLOBIN A1C  06/11/2023   Diabetic kidney evaluation - eGFR measurement  12/21/2023   Colonoscopy  01/04/2025   HIV Screening  Completed   Zoster Vaccines- Shingrix  Completed   HPV VACCINES  Aged Out    Objective: BP 119/83   Pulse 85   Temp 97.7 F (36.5 C) (Oral)   Wt 124 lb (56.2 kg)   SpO2 99%   BMI 18.85 kg/m    Physical Exam Constitutional:      Appearance: Normal appearance.  HENT:     Head: Normocephalic and atraumatic.      Mouth: Mucous membranes are moist.  Eyes:    Conjunctiva/sclera: Conjunctivae normal.     Pupils: Pupils are equal, round, and bilaterally symmetrical   Cardiovascular:     Rate and Rhythm: Normal rate and regular rhythm.     Heart sounds: s1s2  Pulmonary:     Effort: Pulmonary effort is normal.     Breath sounds: Normal breath sounds.   Abdominal:     General: Non distended     Palpations: soft. Drain in the rt upper abdomen, no surrounding cellulitis. No drainage in the bulb.    Musculoskeletal:        General: Normal range of motion. Ambulatory   Skin:    General: Skin is warm and dry.     Comments:  Neurological:     General: grossly non  focal     Mental Status: awake, alert and oriented to person, place, and time.   Psychiatric:        Mood and Affect: Mood normal.   Lab Results Lab Results  Component Value Date   WBC 7.3 12/21/2022   HGB 9.8 (L) 12/21/2022   HCT 30.0 (L) 12/21/2022   MCV 89.0 12/21/2022   PLT 92 (L) 12/21/2022    Lab Results  Component Value Date   CREATININE 0.73 12/21/2022   BUN 10 12/21/2022   NA 134 (L) 12/21/2022   K 4.1 12/21/2022   CL 104 12/21/2022   CO2 23 12/21/2022    Lab Results  Component Value Date   ALT 20 12/20/2022   AST 18 12/20/2022   ALKPHOS 335 (H) 12/20/2022   BILITOT 0.7 12/20/2022    Lab Results  Component Value Date   CHOL 93 09/09/2013   HDL 34 (L) 09/09/2013   LDLCALC 50 09/09/2013   TRIG 45 09/09/2013   CHOLHDL 2.7 09/09/2013   No results found for: "LABRPR", "RPRTITER" No results found for: "HIV1RNAQUANT", "HIV1RNAVL", "CD4TABS"  Microbiology Results for orders placed or performed during the hospital encounter of 12/11/22  Blood culture (routine x 2)     Status: None   Collection Time: 12/12/22  8:04 AM   Specimen: BLOOD  Result Value Ref Range Status   Specimen Description   Final    BLOOD SITE NOT SPECIFIED Performed at [facility], [address] [place], [place] [address]    Special Requests   Final    BOTTLES DRAWN AEROBIC AND ANAEROBIC Blood Culture adequate volume Performed at [facility], [address], [place], [place] [address]    Culture  Setup Time   Final    GRAM POSITIVE RODS ANAEROBIC BOTTLE ONLY CRITICAL RESULT CALLED TO, READ BACK BY AND VERIFIED WITH: PHARMD ELLEN JACKSON ON 12/13/22 @ 2315 BY DRT    Culture   Final    GRAM POSITIVE RODS SENT TO [facility] FOR IDENTIFICATION. SEE SEPARATE REPORT Performed at [facility], [address] [place], [place] [address]    Report Status 12/29/2022 FINAL  Final  Blood culture (routine x 2)     Status: None   Collection Time: 12/12/22  8:04 AM   Specimen: BLOOD  Result Value Ref Range Status   Specimen Description   Final    BLOOD SITE NOT SPECIFIED Performed at [facility], [address], [place], [place] [address]    Special Requests   Final    BOTTLES DRAWN AEROBIC AND ANAEROBIC Blood Culture adequate volume Performed at [facility], [address], [place], [place] [address]    Culture   Final    NO GROWTH 5 DAYS Performed at [facility], [address], [place], [place] [address]    Report Status 12/17/2022 FINAL  Final  SARS Coronavirus 2 by RT PCR (hospital order, performed in [facility] hospital lab) *cepheid single result test* Anterior Nasal Swab     Status: None   Collection Time: 12/12/22  8:32 AM   Specimen: Anterior Nasal Swab  Result Value Ref Range Status   SARS Coronavirus 2 by RT PCR NEGATIVE NEGATIVE Final    Comment: (NOTE) SARS-CoV-2 target nucleic acids are NOT DETECTED.  The SARS-CoV-2 RNA is generally detectable in upper and lower respiratory specimens during the acute phase of infection. The lowest concentration of SARS-CoV-2 viral copies this assay  can detect is 250 copies / mL. A negative result does not preclude SARS-CoV-2 infection and should not be used as the sole basis for treatment or other patient management decisions.  A negative result may occur with improper specimen collection / handling, submission of specimen other than nasopharyngeal swab, presence of viral mutation(s) within the areas targeted by this assay, and inadequate number of viral copies (<250 copies / mL). A negative result must be combined with clinical observations, patient history, and epidemiological information.  Fact Sheet for Patients:   RoadLapTop.co.za  Fact  Sheet for Healthcare Providers: http://kim-miller.com/  This test is not yet approved or  cleared by the [place] FDA and has been authorized for detection and/or diagnosis of SARS-CoV-2 by FDA under an Emergency Use Authorization (EUA).  This EUA will remain in effect (meaning this test can be used) for the duration of the COVID-19 declaration under Section 564(b)(1) of the Act, 21 U.S.C. section 360bbb-3(b)(1), unless the authorization is terminated or revoked sooner.  Performed at [facility], [address] [place], [place] [address]   Aerobic/Anaerobic Culture w Gram Stain (surgical/deep wound)     Status: None   Collection Time: 12/14/22 10:17 AM   Specimen: Liver; Abscess  Result Value Ref Range Status   Specimen Description   Final    LIVER ABSCESS Performed at [facility], [address] [place], [place] [address]    Special Requests   Final    NONE Performed at [facility], [address] [place], [place] [address]    Gram Stain NO WBC SEEN NO ORGANISMS SEEN   Final   Culture   Final    FEW STREPTOCOCCUS CONSTELLATUS Beta hemolytic streptococci are predictably susceptible to penicillin and other beta lactams. Susceptibility testing not routinely performed. ABUNDANT PEPTOSTREPTOCOCCUS MICROS Standardized susceptibility testing for this organism is not available. Performed at [facility], [address], [place], [place] [address]    Report Status 12/20/2022 FINAL  Final  MRSA Next Gen by PCR, Nasal     Status: None   Collection Time: 12/14/22  5:08 PM   Specimen: Nasal Mucosa; Nasal Swab  Result Value Ref Range Status   MRSA by PCR Next Gen NOT DETECTED NOT DETECTED Final    Comment: (NOTE) The GeneXpert MRSA Assay (FDA approved for NASAL specimens only), is one component of a comprehensive MRSA colonization surveillance program. It is not intended to diagnose MRSA  infection nor to guide or monitor treatment for MRSA infections. Test performance is not FDA approved in patients less than 39 years old. Performed at [facility], [address] [place], [place] [address]   Culture, blood (Routine X 2) w Reflex to ID Panel     Status: None   Collection Time: 12/17/22  9:16 AM   Specimen: BLOOD  Result Value Ref Range Status   Specimen Description   Final    BLOOD SITE NOT SPECIFIED Performed at [facility], [address], [place], [place] [address]    Special Requests   Final    BOTTLES DRAWN AEROBIC AND ANAEROBIC Blood Culture adequate volume Performed at [facility], [address] [place], [place] [address]    Culture   Final    NO GROWTH 5 DAYS Performed at [facility], [address], [place], [place] [address]    Report Status 12/22/2022 FINAL  Final  Culture, blood (Routine X 2) w  Reflex to ID Panel     Status: None   Collection Time: 12/17/22  9:16 AM   Specimen: BLOOD  Result Value Ref Range Status   Specimen Description   Final    BLOOD SITE NOT SPECIFIED Performed at [facility], [address] [place], [place] [address]    Special Requests   Final    BOTTLES DRAWN AEROBIC AND ANAEROBIC Blood Culture adequate volume Performed at [facility], [address], [place], [place] [address]    Culture   Final    NO GROWTH 5 DAYS Performed at [facility], [address], [place], [place] [address]    Report Status 12/22/2022 FINAL  Final   Imaging IR Catheter Tube Change  Result Date: 12/19/2022 CLINICAL DATA:  52 year old male with history of hepatic abscess status post percutaneous drain placement on 12/14/2022 with diminished draining and CT evidence of suboptimal positioning. EXAM: IR CATHETER TUBE CHANGE COMPARISON:  12/14/2022, 12/17/2022 CONTRAST:  5 mL Omnipaque 300-administered via the percutaneous drainage  catheter. MEDICATIONS: None. ANESTHESIA/SEDATION: None FLUOROSCOPY TIME:  Eight mGy TECHNIQUE: Patient was positioned prone on the fluoroscopy table. The external portion of the existing percutaneous drainage catheter as well as the surrounding skin was prepped and draped in usual sterile fashion. A preprocedural spot fluoroscopic image was obtained of the existing percutaneous drainage catheter. A small amount of contrast was injected via the existing percutaneous drainage catheter and several fluoroscopic images were obtained in various obliquities. The external portion of the percutaneous drainage catheter was cut and cannulated with a short Amplatz wire. Under intermittent fluoroscopic guidance, the existing percutaneous drainage catheter was exchanged for a new 10 French percutaneous drainage catheter with end coiled and locked within the more superior aspect of the decompressed abscess cavity. Contrast injection confirmed appropriate position functionality of the percutaneous drainage catheter. The percutaneous drainage catheter was connected to a bulb suction and secured in place within interrupted suture and a StatLock device. A dressing was applied. The patient tolerated the procedure well without immediate postprocedural complication. IMPRESSION: Technically successful hepatic abscess drain exchange repositioning to more superior location within the abscess. PLAN: Interventional radiology will follow.  Keep to bulb suction for now. Marliss Coots, MD [facility] Radiology Electronically Signed   By: Marliss Coots M.D.   On: 12/19/2022 15:06   CT ABDOMEN PELVIS W CONTRAST  Result Date: 12/17/2022 CLINICAL DATA:  Intraabdominal abscess EXAM: CT ABDOMEN AND PELVIS WITH CONTRAST TECHNIQUE: Multidetector CT imaging of the abdomen and pelvis was performed using the standard protocol following bolus administration of intravenous contrast. RADIATION DOSE  REDUCTION: This exam was performed according to the departmental dose-optimization program which includes automated exposure control, adjustment of the mA and/or kV according to patient size and/or use of iterative reconstruction technique. CONTRAST:  OMNIPAQUE IOHEXOL 300 MG/ML  SOLN COMPARISON:  CT abdomen and pelvis 12/12/2022 FINDINGS: Lower chest: There are small bilateral pleural effusions, right greater than left, which are new from prior. There is new atelectasis in the bilateral lung bases. Hepatobiliary: Heterogeneous multi-septated peripherally enhancing low-attenuation area in the liver is again seen. There is a new percutaneous drainage catheter in the inferior portion. Overall size has increased now measuring 8.0 x 4.9 x 11.0 cm (previously 4.5 x 3.4 x 7.6 cm). Note is made of a new linear tract measuring 5.5 cm in length containing air and fluid likely secondary to needle/procedure located anterior to this lesion. There is stable  intrahepatic biliary ductal dilatation. The gallbladder is unremarkable. Pancreas: There is diffuse pancreatic atrophy, ductal dilatation in calcifications compatible with chronic pancreatitis. Pancreatic head stent is unchanged in position. Spleen: The spleen is moderately enlarged and unchanged in size. Diffuse heterogeneity of the spleen may be related to phase of contrast. Adrenals/Urinary Tract: There is no hydronephrosis or perinephric stranding. Bilateral renal cysts are unchanged. Bladder and adrenal glands are within normal limits. Stomach/Bowel: Stomach is within normal limits. No evidence of bowel wall thickening, distention, or inflammatory changes. The appendix is not seen. There is a large amount of stool throughout the colon. Vascular/Lymphatic: Aorta and IVC are normal in size. 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 No lymphadenopathy identified. Reproductive: Prostate is  unremarkable. Other: There small fat containing inguinal hernias. There is new small amount of ascites in the abdomen and pelvis. There is new body wall edema. Musculoskeletal: No fracture is seen. IMPRESSION: 1. Interval placement of percutaneous drainage catheter in the inferior portion of the liver abscess. Overall size of the abscess has increased in size. 2. New linear tract containing air and fluid anterior to the abscess likely secondary to needle/procedure. 3. New small bilateral pleural effusions with bibasilar atelectasis. 4. New small amount of ascites and body wall edema. 5. Stable findings of chronic pancreatitis and pancreatic stent. 6. Stable splenomegaly. 7. 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 8. Large amount of stool throughout the colon. Electronically Signed   By: Darliss Cheney M.D.   On: 12/17/2022 16:53   CT GUIDED PERITONEAL/RETROPERITONEAL FLUID DRAIN BY PERC CATH  Result Date: 12/14/2022 INDICATION: 52 year old male presenting with hepatic fluid collection and leukocytosis concerning for abscess. EXAM: CT PERC DRAIN PERITONEAL ABCESS COMPARISON:  None Available. MEDICATIONS: The patient is currently admitted to the hospital and receiving intravenous antibiotics. The antibiotics were administered within an appropriate time frame prior to the initiation of the procedure. ANESTHESIA/SEDATION: Moderate (conscious) sedation was employed during this procedure. A total of Versed 1.5 mg and Fentanyl 25 mcg was administered intravenously. Moderate Sedation Time: 5 minutes. The patient's level of consciousness and vital signs were monitored continuously by radiology nursing throughout the procedure under my direct supervision. CONTRAST:  None COMPLICATIONS: None immediate. PROCEDURE: RADIATION DOSE REDUCTION: This exam was performed according to the departmental dose-optimization program which includes automated exposure control,  adjustment of the mA and/or kV according to patient size and/or use of iterative reconstruction technique. Informed written consent was obtained from the patient after a discussion of the risks, benefits and alternatives to treatment. The patient was placed in the left lateral decubitus position on the CT gantry and a pre procedural CT was performed re-demonstrating the known abscess/fluid collection within the right lobe of the liver. The procedure was planned. A timeout was performed prior to the initiation of the procedure. The right posterior flank was prepped and draped in the usual sterile fashion. The overlying soft tissues were anesthetized with 1% lidocaine with epinephrine. Appropriate trajectory was planned with the use of a 22 gauge spinal needle. An 18 gauge trocar needle was advanced into the abscess/fluid collection and a short Amplatz super stiff wire was coiled within the collection. Appropriate positioning was confirmed with a limited CT scan. The tract was serially dilated allowing placement of a 10 [place] all-purpose drainage catheter. Appropriate positioning was confirmed with a limited postprocedural CT scan. A proximally 15 ml of sanguinous, purulent fluid  was aspirated. The tube was connected to a bulb suction and sutured in place. A dressing was placed. The patient tolerated the procedure well without immediate post procedural complication. IMPRESSION: Successful CT guided placement of a 10 French all purpose drain catheter into the right hepatic fluid collection with aspiration of proximally 15 mL of sanguinous, purulent fluid. Samples were sent to the laboratory as requested by the ordering clinical team. Marliss Coots, MD [facility] Radiology Electronically Signed   By: Marliss Coots M.D.   On: 12/14/2022 18:39   CT ABDOMEN PELVIS W CONTRAST  Result Date: 12/12/2022 CLINICAL DATA:  History of mass-forming chronic pancreatitis with three  day history of right upper abdominal pain associated with fever EXAM: CT ABDOMEN AND PELVIS WITH CONTRAST TECHNIQUE: Multidetector CT imaging of the abdomen and pelvis was performed using the standard protocol following bolus administration of intravenous contrast. RADIATION DOSE REDUCTION: This exam was performed according to the departmental dose-optimization program which includes automated exposure control, adjustment of the mA and/or kV according to patient size and/or use of iterative reconstruction technique. CONTRAST:  OMNIPAQUE IOHEXOL 300 MG/ML  SOLN COMPARISON:  MR abdomen dated 10/15/2022, CT abdomen and pelvis dated 09/19/2022, CTA abdomen and pelvis dated 05/10/2022 FINDINGS: Lower chest: No focal consolidation or pulmonary nodule in the lung bases. No pleural effusion or pneumothorax demonstrated. Partially imaged heart size is normal. Hepatobiliary: Interval development of multiloculated cystic focus centered within segment 7 measuring 4.5 x 3.4 x 7.6 cm (2:15, 7:88) With surrounding parenchymal hyperenhancement. Persistent intra hepatic bile duct dilation to the level of the portal confluence, where long segment narrowing of the common bile duct is again seen. Normal gallbladder. Pancreas: Sequela of chronic pancreatitis with diffuse parenchymal atrophy and bulky stone in the region of the pancreatic head. Spleen: Similar enlargement, 17.5 cm in the AP dimension. Adrenals/Urinary Tract: No adrenal nodules. No suspicious renal mass, calculi or hydronephrosis. Bilateral simple cysts. No specific follow-up imaging recommended. No focal bladder wall thickening. Stomach/Bowel: Normal appearance of the stomach. No evidence of bowel wall thickening, distention, or inflammatory changes. Appendix is not discretely seen. 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 Aortic atherosclerosis. No enlarged abdominal or  pelvic lymph nodes. Reproductive: Prostate is unremarkable. Other: No free fluid, fluid collection, or free air. Musculoskeletal: No acute or abnormal lytic or blastic osseous lesions. IMPRESSION: 1. Interval development of multiloculated cystic focus centered within segment 7 measuring 4.5 x 3.4 x 7.6 cm with surrounding parenchymal hyperenhancement, suspicious for hepatic abscess. 2. Persistent intra hepatic bile duct dilation to the level of the portal confluence, where long segment narrowing of the common bile duct is again seen. 3. Sequela of chronic pancreatitis with diffuse parenchymal atrophy and bulky stone in the region of the pancreatic head. 4. Similar splenomegaly. 5. 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 6.  Aortic Atherosclerosis (ICD10-I70.0). Electronically Signed   By: Agustin Cree M.D.   On: 12/12/2022 10:39   US Abdomen Complete  Result Date: 12/12/2022 CLINICAL DATA:  Upper abdominal pain EXAM: ABDOMEN ULTRASOUND COMPLETE COMPARISON:  CT abdomen and pelvis 08/19/2016 FINDINGS: Gallbladder: No gallstones or wall thickening visualized. No sonographic Murphy sign noted by sonographer. Common bile duct: Diameter: 3.7 mm Liver: No focal lesion identified. Within normal limits in parenchymal echogenicity. Portal vein is patent on color Doppler imaging with normal direction of blood flow towards the  liver. IVC: No abnormality visualized. Pancreas: Visualized portion unremarkable. Spleen: Size and appearance within normal limits. Right Kidney: Length: 9.6 cm. Echogenicity within normal limits. No mass or hydronephrosis visualized. Left Kidney: Length: 10.4 cm. Echogenicity within normal limits. No mass or hydronephrosis visualized. There is a 2.5 cm simple cyst left kidney. No imaging follow-up needed. Abdominal aorta: No aneurysm visualized. Other findings: None. IMPRESSION: No cholelithiasis or sonographic evidence for acute cholecystitis.  Electronically Signed   By: Annia Belt M.D.   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  - Repeat CT  9/24 with worsening abscess size. This was followed by drain exchange 9/26 with 10 Fr catheter to more superior location.  Plan  - Continue po augmentin as is. Solobacterium will likely be covered by po augmentin  - Labs today  - Fu in 10 days after CT scheduled on 10/21  # Alcoholic liver cirrhosis - needs HCC screening with US abdomen every 6 months  - EGD 04/1722 with varices +  I have personally spent  44 minutes involved in face-to-face and non-face-to-face activities for this patient on the day of the visit. Professional time spent includes the following activities: Preparing to see the patient (review of tests), Obtaining and/or reviewing separately obtained history (admission/discharge record), Performing a medically appropriate examination and/or evaluation , Ordering medications/tests/procedures, referring and communicating with other health care professionals, Documenting clinical information in the EMR, Independently interpreting results (not separately reported), Communicating results to the patient/family/caregiver, Counseling and educating the patient/family/caregiver and Care coordination (not separately reported).   Victoriano Lain, MD [facility] 01/07/2023, 3:04 PM

## 2023-01-08 LAB — COMPREHENSIVE METABOLIC PANEL
AG Ratio: 1 (calc) (ref 1.0–2.5)
ALT: 30 U/L (ref 9–46)
AST: 25 U/L (ref 10–35)
Albumin: 3.7 g/dL (ref 3.6–5.1)
Alkaline phosphatase (APISO): 295 U/L — ABNORMAL HIGH (ref 35–144)
BUN: 21 mg/dL (ref 7–25)
CO2: 25 mmol/L (ref 20–32)
Calcium: 9.4 mg/dL (ref 8.6–10.3)
Chloride: 106 mmol/L (ref 98–110)
Creat: 1 mg/dL (ref 0.70–1.30)
Globulin: 3.7 g/dL (ref 1.9–3.7)
Glucose, Bld: 131 mg/dL — ABNORMAL HIGH (ref 65–99)
Potassium: 4.4 mmol/L (ref 3.5–5.3)
Sodium: 140 mmol/L (ref 135–146)
Total Bilirubin: 1.1 mg/dL (ref 0.2–1.2)
Total Protein: 7.4 g/dL (ref 6.1–8.1)

## 2023-01-08 LAB — CBC
HCT: 35.9 % — ABNORMAL LOW (ref 38.5–50.0)
Hemoglobin: 11.5 g/dL — ABNORMAL LOW (ref 13.2–17.1)
MCH: 29.2 pg (ref 27.0–33.0)
MCHC: 32 g/dL (ref 32.0–36.0)
MCV: 91.1 fL (ref 80.0–100.0)
MPV: 10.8 fL (ref 7.5–12.5)
Platelets: 49 10*3/uL — ABNORMAL LOW (ref 140–400)
RBC: 3.94 10*6/uL — ABNORMAL LOW (ref 4.20–5.80)
RDW: 15 % (ref 11.0–15.0)
WBC: 3.2 10*3/uL — ABNORMAL LOW (ref 3.8–10.8)

## 2023-01-08 LAB — SEDIMENTATION RATE: Sed Rate: 29 mm/h — ABNORMAL HIGH (ref 0–20)

## 2023-01-08 LAB — C-REACTIVE PROTEIN: CRP: <3 mg/L (ref <8.0)

## 2023-01-09 ENCOUNTER — Encounter (HOSPITAL_COMMUNITY): Payer: Self-pay | Admitting: Radiology

## 2023-01-13 ENCOUNTER — Ambulatory Visit (HOSPITAL_COMMUNITY): Admission: RE | Admit: 2023-01-13 | Payer: BLUE CROSS/BLUE SHIELD | Source: Ambulatory Visit

## 2023-01-13 ENCOUNTER — Encounter (HOSPITAL_COMMUNITY): Payer: Self-pay

## 2023-01-13 ENCOUNTER — Ambulatory Visit (HOSPITAL_COMMUNITY): Payer: BLUE CROSS/BLUE SHIELD

## 2023-01-14 ENCOUNTER — Ambulatory Visit (HOSPITAL_COMMUNITY): Payer: BLUE CROSS/BLUE SHIELD

## 2023-01-15 ENCOUNTER — Ambulatory Visit (HOSPITAL_COMMUNITY): Payer: BLUE CROSS/BLUE SHIELD

## 2023-01-16 ENCOUNTER — Ambulatory Visit (HOSPITAL_COMMUNITY)
Admission: RE | Admit: 2023-01-16 | Discharge: 2023-01-16 | Disposition: A | Discharge status: Home / Self Care | Payer: BLUE CROSS/BLUE SHIELD | Source: Ambulatory Visit | Attending: Radiology | Admitting: Radiology

## 2023-01-16 ENCOUNTER — Encounter (HOSPITAL_COMMUNITY): Payer: Self-pay

## 2023-01-16 ENCOUNTER — Other Ambulatory Visit: Payer: Self-pay

## 2023-01-16 ENCOUNTER — Other Ambulatory Visit (HOSPITAL_COMMUNITY): Payer: Self-pay | Admitting: Radiology

## 2023-01-16 ENCOUNTER — Ambulatory Visit: Payer: BLUE CROSS/BLUE SHIELD | Admitting: Infectious Diseases

## 2023-01-16 DIAGNOSIS — E119 Type 2 diabetes mellitus without complications: Secondary | ICD-10-CM

## 2023-01-16 DIAGNOSIS — E871 Hypo-osmolality and hyponatremia: Secondary | ICD-10-CM

## 2023-01-16 DIAGNOSIS — K750 Abscess of liver: Secondary | ICD-10-CM

## 2023-01-16 HISTORY — PX: ABSCESS DRAINAGE: SHX1119

## 2023-01-16 HISTORY — PX: IR PATIENT EVAL TECH 0-60 MINS: IMG5564

## 2023-01-16 NOTE — Procedures (Signed)
The pt came in at this time to have his abscess drain removed by Beckey Downing PA-C. A new dressing was applied to the site after the drain was removed.

## 2023-01-16 NOTE — Progress Notes (Addendum)
   IR Note  Pt is here today to evaluate hepatic abscess drain that was placed in IR 9/21  OP has been almost none for days Flush is coming out at site Afeb Denies pain or chills  Dr Loreta Ave has reviewed recent imaging from [facility]  Removed drain per Dr Loreta Ave order Pt tolerated well Dressing placed

## 2023-01-20 ENCOUNTER — Telehealth: Payer: Self-pay

## 2023-01-20 NOTE — Telephone Encounter (Signed)
Called [facility] to get CT results from 10/22 faxed over.  [facility] - RADIOLOGY CT  [address]  [address]  [place], [place] [address]  [phone]   Scan has not been read yet. Called [facility] to request that read be moved up in queue, no representative available at this time.    Sandie Ano, RN

## 2023-01-21 ENCOUNTER — Encounter: Payer: Self-pay | Admitting: Infectious Diseases

## 2023-01-21 ENCOUNTER — Ambulatory Visit: Payer: BLUE CROSS/BLUE SHIELD | Admitting: Infectious Diseases

## 2023-01-21 ENCOUNTER — Other Ambulatory Visit: Payer: Self-pay

## 2023-01-21 VITALS — BP 121/85 | HR 71 | Temp 98.6°F | Ht 68.0 in | Wt 130.0 lb

## 2023-01-21 DIAGNOSIS — K7030 Alcoholic cirrhosis of liver without ascites: Secondary | ICD-10-CM

## 2023-01-21 DIAGNOSIS — K750 Abscess of liver: Secondary | ICD-10-CM | POA: Diagnosis not present

## 2023-01-21 DIAGNOSIS — Z79899 Other long term (current) drug therapy: Secondary | ICD-10-CM

## 2023-01-21 MED ORDER — AMOXICILLIN-POT CLAVULANATE 875-125 MG PO TABS: 1.0000 | ORAL_TABLET | Freq: Two times a day (BID) | ORAL | 0 refills | Status: DC

## 2023-01-21 NOTE — Progress Notes (Signed)
Patient Active Problem List   Diagnosis Date Noted  . Medication management 01/07/2023  . Hyponatremia 12/24/2022  . Chronic obstructive pulmonary disease (HCC) 12/18/2022  . Gastroesophageal reflux disease 12/18/2022  . Type 2 diabetes mellitus without complication, without long-term current use of insulin (HCC) 12/18/2022  . Constipation 12/18/2022  . Sepsis (HCC) 12/12/2022  . Portal hypertensive gastropathy (HCC) 05/12/2022  . Hypotension 05/11/2022  . Internal hemorrhoid 05/11/2022  . Acute GI bleeding 05/11/2022  . Acute upper GI bleeding 05/10/2022  . Syncope 05/10/2022  . Abnormal finding on GI tract imaging 05/10/2022  . Hematochezia 05/10/2022  . Acute blood loss anemia 05/10/2022  . Lactic acidosis 05/10/2022  . Lower abdominal pain 05/10/2022  . Chest pain 05/10/2022  . Alcohol abuse, in remission 05/10/2022  . Tobacco abuse 05/10/2022  . Alcoholism in remission (HCC) 02/01/2022  . Splenomegaly 05/30/2021  . History of esophageal varices 08/13/2019  . Hematemesis with nausea 08/19/2016  . Bleeding esophageal varices (HCC)   . Anemia, iron deficiency   . Anemia 06/26/2015  . 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/03/2015  . Bleeding gastrointestinal 12/29/2014  . Alcoholic cirrhosis of liver without ascites (HCC) 12/29/2014  . Portal vein thrombosis 12/29/2014  . CAP (community acquired pneumonia) 10/31/2014  . Pancytopenia (HCC) 10/31/2014  . Pancreatitis 10/31/2014  . Cholangitis   . Biliary obstruction 02/22/2014  . 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 Common bile duct stricture 02/18/2014  . Choledocholithiasis 02/18/2014  . Hepatic abscess 02/16/2014  . Elevated LFTs 02/15/2014  . Other specified fever 02/15/2014  . Duodenal ulcer 09/14/2013  . Fever 09/10/2013  . Hypokalemia 09/09/2013  . Thrombocytopenia, unspecified (HCC) 09/09/2013  . Normocytic anemia 09/09/2013  . Pancreatic pseudocyst 09/09/2013  . Constipation 09/09/2013     Patient's Medications  New Prescriptions   No medications on file  Previous Medications   DICYCLOMINE (BENTYL) 20 MG TABLET    Take 20 mg by mouth in the morning and at bedtime.   FLUOXETINE (PROZAC) 10 MG TABLET    Take 10 mg by mouth daily.   HYDROCODONE-ACETAMINOPHEN (NORCO) 7.5-325 MG TABLET    Take 1 tablet by mouth every 6 (six) hours as needed for moderate pain.   METFORMIN (GLUCOPHAGE) 1000 MG TABLET    Take 1 tablet by mouth daily with breakfast.   METHOCARBAMOL (ROBAXIN) 500 MG TABLET    Take 500 mg by mouth 3 (three) times daily.   NICOTINE (NICODERM CQ - DOSED IN MG/24 HOURS) 21 MG/24HR PATCH    Place 1 patch (21 mg total) onto the skin daily.   PANTOPRAZOLE (PROTONIX) 40 MG TABLET    Take 1 tablet (40 mg total) by mouth 2 (two) times daily for 30 days, THEN 1 tablet (40 mg total) daily.   POLYETHYLENE GLYCOL POWDER (MIRALAX) 17 GM/SCOOP POWDER    Take 17 g by mouth 2 (two) times daily as needed for moderate constipation or mild constipation.   SODIUM CHLORIDE FLUSH (NORMAL SALINE FLUSH) 0.9 % SOLN    Use 5 ml as directed to flush abdominal drain once daily.   SPIRONOLACTONE (ALDACTONE) 50 MG TABLET    Take 50 mg by mouth daily.   SYMBICORT 160-4.5 MCG/ACT INHALER    Inhale 2 puffs into the lungs 2 (two) times daily.   TRIAMCINOLONE CREAM (KENALOG) 0.5 %    Apply topically.  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 CT abdomen/pelvis 9/19 hepatic abscesses. Seen by General surgery, GI, IR and ID. . S/p CT guided drain placement 9/21 by IR with cx growing streptococcus constellatus.  Repeat CT  9/24 with worsening abscess size. This was followed by drain exchange 9/26 with 10 Fr catheter to more superior location. 9/21 blood cx 1/2 sets GPR, sent to [facility] to ID. Patient initially on IV abtx then transitioned to PO augmentin to complete 4 weeks course during discharge. EOT 10/24  01/07/23  He currently has a drain in place, which has not been producing any drainage for the past two days. He denies any abdominal pain, tenderness, or swelling at the site of the drain. He has been gaining weight since his hospital discharge, increasing from 109 to 125 pounds. He is currently taking po augmentin twice daily without any issues, although he reports his stool has been soft. Denies nausea, vomiting. Appetite is good. Reports he was tested for Hepatitis B and C when he traveled to [place] 2 years ago for his job and was tested negative. Reports he moved from [place] at the age of 13. Denies drinking alcohol for last 8 years. Denies IVDU.   He had a CT abdomen/pelvis done 10/22 with findings as below Took last dose of abtx on Saturday and   Review of Systems: as above  Past Medical History:  Diagnosis Date  . Abdominal pain    intermittent abdominal pain in lower abdomen  . Anemia   . Cholangitis 01/2014   with liver abscess  . Chronic back pain   . 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 pseudocyst in 08/2013. Follwed by Dr Rob Bunting and Rise Mu (at [facility]).    . Chronic pancreatitis (HCC) 09/10/2013  . Fatty liver    cirrhosis of liver never confirmed.   . Hypertension    No meds yet.  . Liver abscess 01/2014  . Pneumonia    10-30-14 [place] admission, no problems now.  . 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   . Thrombocytopenia (HCC) 2015   with splenomegaly 03/2014.  s/p bone marrow biopsy, nonspecific findings 09/2015   Past Surgical History:  Procedure Laterality Date  . ABSCESS  DRAINAGE  01/16/2023  . APPENDECTOMY    . BIOPSY  05/11/2022   Procedure: BIOPSY;  Surgeon: Jenel Lucks, MD;  Location: [facility] ENDOSCOPY;  Service: Gastroenterology;;  . COLONOSCOPY WITH PROPOFOL N/A 01/05/2015   Rachael Fee, MD;  Thre 7 to 13 mm tubular adenomatous polyps removed. repeat colonoscopy suggested in 12/2017.    Marland Kitchen ENDOSCOPIC RETROGRADE CHOLANGIOPANCREATOGRAPHY (ERCP) WITH PROPOFOL N/A 06/02/2014   Dr Rob Bunting.  removed covered metal stent.  no stricture noted.  Rec: biliary diversion surgery if recurrent stricture arises.  Marland Kitchen ERCP N/A 02/22/2014   Meryl Dare, MD; with removal of plastic stent, replaced with metal biliary stent.  filling defect: stone, sludge, blood clots removed with baloon sweep.    Marland Kitchen ERCP N/A 02/18/2014   For suspected cholangitis. Louis Meckel, MD; s/p sphincterotomy, blood clots and sludge swept out, remnant of pancreatic duct noted (removal not mentioned.  plastic stent placed into CBD.    Marland Kitchen  ERCP, plastic pancreatic duct stent placement.  2007   Dr Danny Lawless at [facility]   . ESOPHAGEAL BANDING  05/11/2022   Procedure: ESOPHAGEAL BANDING;  Surgeon: Jenel Lucks, MD;  Location: [facility] ENDOSCOPY;  Service: Gastroenterology;;  . ESOPHAGOGASTRODUODENOSCOPY N/A 05/11/2022   Procedure: ESOPHAGOGASTRODUODENOSCOPY (EGD);  Surgeon: Jenel Lucks, MD;  Location: [facility] ENDOSCOPY;  Service: Gastroenterology;  Laterality: N/A;  . ESOPHAGOGASTRODUODENOSCOPY (EGD) WITH PROPOFOL N/A 09/14/2013   Dr Rhea Belton.  erosive gastritis.  bulbar ulcer.  removal of plastic biliaty stent.    . ESOPHAGOGASTRODUODENOSCOPY (EGD) WITH PROPOFOL N/A 01/05/2015   for melena. Rachael Fee.  Large distal esophageal varices, no bleeding stigmata.  no gastric varices, not banded.  Moderate to severe portal hypertensive gastropathy.  Nadolol initiated.   . ESOPHAGOGASTRODUODENOSCOPY (EGD) WITH PROPOFOL N/A 08/19/2016   Armbruster, Reeves Forth, MD; Large esophageal  varices with red whale signs, high risk for bleeding. 11 bands placed.  Portal hypertensive gastropathy.   Marland Kitchen FLEXIBLE SIGMOIDOSCOPY N/A 05/11/2022   Procedure: FLEXIBLE SIGMOIDOSCOPY;  Surgeon: Jenel Lucks, MD;  Location: [facility] ENDOSCOPY;  Service: Gastroenterology;  Laterality: N/A;  . GASTROINTESTINAL STENT REMOVAL N/A 09/14/2013   Beverley Fiedler, MD; Pancreatic stent removal  . IR CATHETER TUBE CHANGE  12/19/2022  . IR PATIENT EVAL TECH 0-60 MINS  01/16/2023     Social History   Tobacco Use  . Smoking status: Every Day    Current packs/day: 0.50    Types: Cigarettes  . Smokeless tobacco: Never  Substance Use Topics  . Alcohol use: No    Alcohol/week: 12.0 standard drinks of alcohol    Types: 12 Cans of beer per week    Comment: former ETOH abuse, quit around 2016 per pt  . Drug use: No    Family History  Problem Relation Age of Onset  . Hypertension Mother     No Known Allergies  Health Maintenance  Topic Date Due  . FOOT EXAM  Never done  . OPHTHALMOLOGY EXAM  Never done  . Diabetic kidney evaluation - Urine ACR  Never done  . Hepatitis C Screening  Never done  . COVID-19 Vaccine (3 - Moderna risk series) 03/10/2021  . INFLUENZA VACCINE  10/24/2022  . HEMOGLOBIN A1C  06/11/2023  . Diabetic kidney evaluation - eGFR measurement  01/07/2024  . Colonoscopy  01/04/2025  . DTaP/Tdap/Td (2 - Td or Tdap) 02/11/2031  . HIV Screening  Completed  . Zoster Vaccines- Shingrix  Completed  . HPV VACCINES  Aged Out    Objective: BP 121/85   Pulse 71   Temp 98.6 F (37 C) (Temporal)   Ht 5\' 8"  (1.727 m)   Wt 130 lb (59 kg)   SpO2 99%   BMI 19.77 kg/m    Physical Exam Constitutional:      Appearance: Normal appearance.  HENT:     Head: Normocephalic and atraumatic.      Mouth: Mucous membranes are moist.  Eyes:    Conjunctiva/sclera: Conjunctivae normal.     Pupils: Pupils are equal, round, and bilaterally symmetrical   Cardiovascular:     Rate and Rhythm:  Normal rate and regular rhythm.     Heart sounds: s1s2  Pulmonary:     Effort: Pulmonary effort is normal.     Breath sounds: Normal breath sounds.   Abdominal:     General: Non distended     Palpations: soft. Drain in the rt upper abdomen, no surrounding cellulitis. No drainage in the  bulb.   Musculoskeletal:        General: Normal range of motion. Ambulatory   Skin:    General: Skin is warm and dry.     Comments:  Neurological:     General: grossly non focal     Mental Status: awake, alert and oriented to person, place, and time.   Psychiatric:        Mood and Affect: Mood normal.   Lab Results Lab Results  Component Value Date   WBC 3.2 (L) 01/07/2023   HGB 11.5 (L) 01/07/2023   HCT 35.9 (L) 01/07/2023   MCV 91.1 01/07/2023   PLT 49 (L) 01/07/2023    Lab Results  Component Value Date   CREATININE 1.00 01/07/2023   BUN 21 01/07/2023   NA 140 01/07/2023   K 4.4 01/07/2023   CL 106 01/07/2023   CO2 25 01/07/2023    Lab Results  Component Value Date   ALT 30 01/07/2023   AST 25 01/07/2023   ALKPHOS 335 (H) 12/20/2022   BILITOT 1.1 01/07/2023    Lab Results  Component Value Date   CHOL 93 09/09/2013   HDL 34 (L) 09/09/2013   LDLCALC 50 09/09/2013   TRIG 45 09/09/2013   CHOLHDL 2.7 09/09/2013   No results found for: "LABRPR", "RPRTITER" No results found for: "HIV1RNAQUANT", "HIV1RNAVL", "CD4TABS"  Microbiology Results for orders placed or performed during the hospital encounter of 12/11/22  Blood culture (routine x 2)     Status: None   Collection Time: 12/12/22  8:04 AM   Specimen: BLOOD  Result Value Ref Range Status   Specimen Description   Final    BLOOD SITE NOT SPECIFIED Performed at [facility], [address] [place], [place] [address]    Special Requests   Final    BOTTLES DRAWN AEROBIC AND ANAEROBIC Blood Culture adequate volume Performed at [facility], [address], [place], [place] [address]    Culture   Setup Time   Final    GRAM POSITIVE RODS ANAEROBIC BOTTLE ONLY CRITICAL RESULT CALLED TO, READ BACK BY AND VERIFIED WITH: PHARMD ELLEN JACKSON ON 12/13/22 @ 2315 BY DRT    Culture   Final    GRAM POSITIVE RODS SENT TO [facility] FOR IDENTIFICATION. SEE SEPARATE REPORT Performed at [facility], [address] [place], [place] [address]    Report Status 12/29/2022 FINAL  Final  Blood culture (routine x 2)     Status: None   Collection Time: 12/12/22  8:04 AM   Specimen: BLOOD  Result Value Ref Range Status   Specimen Description   Final    BLOOD SITE NOT SPECIFIED Performed at [facility], [address], [place], [place] [address]    Special Requests   Final    BOTTLES DRAWN AEROBIC AND ANAEROBIC Blood Culture adequate volume Performed at [facility], [address], [place], [place] [address]    Culture   Final    NO GROWTH 5 DAYS Performed at [facility], [address] [place], [place] [address]    Report Status 12/17/2022 FINAL  Final  SARS Coronavirus 2 by RT PCR (hospital order, performed in [facility] hospital lab) *cepheid single result test* Anterior Nasal Swab     Status: None   Collection Time: 12/12/22  8:32 AM   Specimen: Anterior Nasal Swab  Result Value Ref Range Status   SARS Coronavirus 2 by RT PCR  NEGATIVE NEGATIVE Final    Comment: (NOTE) SARS-CoV-2 target nucleic acids are NOT DETECTED.  The SARS-CoV-2 RNA is generally detectable in upper and lower respiratory specimens during the acute phase of infection. The lowest concentration of SARS-CoV-2 viral copies this assay can detect is 250 copies / mL. A negative result does not preclude SARS-CoV-2 infection and should not be used as the sole basis for treatment or other patient management decisions.  A negative result may occur with improper specimen collection / handling, submission of specimen other than nasopharyngeal swab, presence of viral  mutation(s) within the areas targeted by this assay, and inadequate number of viral copies (<250 copies / mL). A negative result must be combined with clinical observations, patient history, and epidemiological information.  Fact Sheet for Patients:   RoadLapTop.co.za  Fact Sheet for Healthcare Providers: http://kim-miller.com/  This test is not yet approved or  cleared by the [place] FDA and has been authorized for detection and/or diagnosis of SARS-CoV-2 by FDA under an Emergency Use Authorization (EUA).  This EUA will remain in effect (meaning this test can be used) for the duration of the COVID-19 declaration under Section 564(b)(1) of the Act, 21 U.S.C. section 360bbb-3(b)(1), unless the authorization is terminated or revoked sooner.  Performed at [facility], [address] [place], [place] [address]   Aerobic/Anaerobic Culture w Gram Stain (surgical/deep wound)     Status: None   Collection Time: 12/14/22 10:17 AM   Specimen: Liver; Abscess  Result Value Ref Range Status   Specimen Description   Final    LIVER ABSCESS Performed at [facility], [address], [place], [place] [address]    Special Requests   Final    NONE Performed at [facility], [address] [place], [place] [address]    Gram Stain NO WBC SEEN NO ORGANISMS SEEN   Final   Culture   Final    FEW STREPTOCOCCUS CONSTELLATUS Beta hemolytic streptococci are predictably susceptible to penicillin and other beta lactams. Susceptibility testing not routinely performed. ABUNDANT PEPTOSTREPTOCOCCUS MICROS Standardized susceptibility testing for this organism is not available. Performed at [facility], [address], [place], [place] [address]    Report Status 12/20/2022 FINAL  Final  MRSA Next Gen by PCR, Nasal     Status: None   Collection Time: 12/14/22  5:08 PM   Specimen: Nasal  Mucosa; Nasal Swab  Result Value Ref Range Status   MRSA by PCR Next Gen NOT DETECTED NOT DETECTED Final    Comment: (NOTE) The GeneXpert MRSA Assay (FDA approved for NASAL specimens only), is one component of a comprehensive MRSA colonization surveillance program. It is not intended to diagnose MRSA infection nor to guide or monitor treatment for MRSA infections. Test performance is not FDA approved in patients less than 30 years old. Performed at [facility], [address] [place], [place] [address]   Culture, blood (Routine X 2) w Reflex to ID Panel     Status: None   Collection Time: 12/17/22  9:16 AM   Specimen: BLOOD  Result Value Ref Range Status   Specimen Description   Final    BLOOD SITE NOT SPECIFIED Performed at [facility], [address], [place], [place] [address]    Special Requests   Final    BOTTLES DRAWN AEROBIC AND ANAEROBIC Blood Culture adequate volume Performed at [facility], [address] [place], [place] [address]  Culture   Final    NO GROWTH 5 DAYS Performed at [facility], [address], [place], [place] [address]    Report Status 12/22/2022 FINAL  Final  Culture, blood (Routine X 2) w Reflex to ID Panel     Status: None   Collection Time: 12/17/22  9:16 AM   Specimen: BLOOD  Result Value Ref Range Status   Specimen Description   Final    BLOOD SITE NOT SPECIFIED Performed at [facility], [address] [place], [place] [address]    Special Requests   Final    BOTTLES DRAWN AEROBIC AND ANAEROBIC Blood Culture adequate volume Performed at [facility], [address] [place], [place] [address]    Culture   Final    NO GROWTH 5 DAYS Performed at [facility], [address] [place], [place] [address]    Report Status 12/22/2022 FINAL  Final   Imaging IR PATIENT EVAL TECH 0-60 MINS  Result Date: 01/16/2023 Edward Qualia     01/16/2023  3:13 PM The pt came in at this time to have his abscess drain removed by Beckey Downing PA-C. A new dressing was applied to the site after the drain was removed.    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  - Repeat CT  9/24 with worsening abscess size. This was followed by drain exchange 9/26 with 10 Fr catheter to more superior location.  Plan  - Continue po augmentin as is. Solobacterium will likely be covered by po augmentin  - Labs today  - Fu in 10 days after CT scheduled on 10/21  # Alcoholic liver cirrhosis - needs HCC screening with US abdomen every 6 months  - EGD 04/1722 with varices +  I have personally spent  44 minutes involved in face-to-face and non-face-to-face activities for this patient on the day of the visit. Professional time spent includes the following activities: Preparing to see the patient (review of tests), Obtaining and/or reviewing separately obtained history (admission/discharge record), Performing a medically appropriate examination and/or evaluation , Ordering medications/tests/procedures, referring and communicating with other health care professionals, Documenting clinical information in the EMR, Independently interpreting results (not separately reported), Communicating results to the patient/family/caregiver, Counseling and educating the patient/family/caregiver and Care coordination (not separately reported).   Victoriano Lain, MD [facility] 01/21/2023,  4:01 PM

## 2023-01-21 NOTE — Telephone Encounter (Signed)
Spoke with Diane at [facility], she is moving patient's CT read to STAT to hopefully have it read prior to his appointment today.   Sandie Ano, RN

## 2023-02-12 ENCOUNTER — Other Ambulatory Visit: Payer: Self-pay

## 2023-02-12 ENCOUNTER — Ambulatory Visit (INDEPENDENT_AMBULATORY_CARE_PROVIDER_SITE_OTHER): Payer: BLUE CROSS/BLUE SHIELD | Admitting: Infectious Diseases

## 2023-02-12 VITALS — BP 128/82 | HR 67 | Temp 98.4°F | Ht 68.0 in | Wt 134.0 lb

## 2023-02-12 DIAGNOSIS — K7030 Alcoholic cirrhosis of liver without ascites: Secondary | ICD-10-CM

## 2023-02-12 DIAGNOSIS — K750 Abscess of liver: Secondary | ICD-10-CM | POA: Diagnosis not present

## 2023-02-12 DIAGNOSIS — Z79899 Other long term (current) drug therapy: Secondary | ICD-10-CM

## 2023-02-12 MED ORDER — AMOXICILLIN-POT CLAVULANATE 875-125 MG PO TABS: 1.0000 | ORAL_TABLET | Freq: Two times a day (BID) | ORAL | 0 refills | Status: DC

## 2023-02-12 NOTE — Progress Notes (Unsigned)
Patient Active Problem List   Diagnosis Date Noted  . Medication management 01/07/2023  . Hyponatremia 12/24/2022  . Chronic obstructive pulmonary disease (HCC) 12/18/2022  . Gastroesophageal reflux disease 12/18/2022  . Type 2 diabetes mellitus without complication, without long-term current use of insulin (HCC) 12/18/2022  . Constipation 12/18/2022  . Sepsis (HCC) 12/12/2022  . Portal hypertensive gastropathy (HCC) 05/12/2022  . Hypotension 05/11/2022  . Internal hemorrhoid 05/11/2022  . Acute GI bleeding 05/11/2022  . Acute upper GI bleeding 05/10/2022  . Syncope 05/10/2022  . Abnormal finding on GI tract imaging 05/10/2022  . Hematochezia 05/10/2022  . Acute blood loss anemia 05/10/2022  . Lactic acidosis 05/10/2022  . Lower abdominal pain 05/10/2022  . Chest pain 05/10/2022  . Alcohol abuse, in remission 05/10/2022  . Tobacco abuse 05/10/2022  . Alcoholism in remission (HCC) 02/01/2022  . Splenomegaly 05/30/2021  . History of esophageal varices 08/13/2019  . Hematemesis with nausea 08/19/2016  . Bleeding esophageal varices (HCC)   . Anemia, iron deficiency   . Anemia 06/26/2015  . 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/03/2015  . Bleeding gastrointestinal 12/29/2014  . Alcoholic cirrhosis of liver without ascites (HCC) 12/29/2014  . Portal vein thrombosis 12/29/2014  . CAP (community acquired pneumonia) 10/31/2014  . Pancytopenia (HCC) 10/31/2014  . Pancreatitis 10/31/2014  . Cholangitis   . Biliary obstruction 02/22/2014  . 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 Common bile duct stricture 02/18/2014  . Choledocholithiasis 02/18/2014  . Hepatic abscess 02/16/2014  . Elevated LFTs 02/15/2014  . Other specified fever 02/15/2014  . Duodenal ulcer 09/14/2013  . Fever 09/10/2013  . Hypokalemia 09/09/2013  . Thrombocytopenia, unspecified (HCC) 09/09/2013  . Normocytic anemia 09/09/2013  . Pancreatic pseudocyst 09/09/2013  . Constipation 09/09/2013     Patient's Medications  New Prescriptions   No medications on file  Previous Medications   AMOXICILLIN-CLAVULANATE (AUGMENTIN) 875-125 MG TABLET    Take 1 tablet by mouth 2 (two) times daily.   DICYCLOMINE (BENTYL) 20 MG TABLET    Take 20 mg by mouth in the morning and at bedtime.   FLUOXETINE (PROZAC) 10 MG TABLET    Take 10 mg by mouth daily.   HYDROCODONE-ACETAMINOPHEN (NORCO) 7.5-325 MG TABLET    Take 1 tablet by mouth every 6 (six) hours as needed for moderate pain.   METFORMIN (GLUCOPHAGE) 1000 MG TABLET    Take 1 tablet by mouth daily with breakfast.   METHOCARBAMOL (ROBAXIN) 500 MG TABLET    Take 500 mg by mouth 3 (three) times daily.   NICOTINE (NICODERM CQ - DOSED IN MG/24 HOURS) 21 MG/24HR PATCH    Place 1 patch (21 mg total) onto the skin daily.   PANTOPRAZOLE (PROTONIX) 40 MG TABLET    Take 1 tablet (40 mg total) by mouth 2 (two) times daily for 30 days, THEN 1 tablet (40 mg total) daily.   POLYETHYLENE GLYCOL POWDER (MIRALAX) 17 GM/SCOOP POWDER    Take 17 g by mouth 2 (two) times daily as needed for moderate constipation or mild constipation.   SODIUM CHLORIDE FLUSH (NORMAL SALINE FLUSH) 0.9 % SOLN    Use 5 ml as directed to flush abdominal drain once daily.   SPIRONOLACTONE (ALDACTONE) 50 MG TABLET    Take 50 mg by mouth daily.   SYMBICORT 160-4.5 MCG/ACT INHALER    Inhale 2 puffs into the lungs 2 (two) times daily.   TRIAMCINOLONE CREAM (KENALOG) 0.5 %    Apply topically.  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 CT abdomen/pelvis 9/19 hepatic abscesses. Seen by General surgery,  GI, IR and ID. . S/p CT guided drain placement 9/21 by IR with cx growing streptococcus constellatus. Repeat CT  9/24 with worsening abscess size. This was followed by drain exchange 9/26 with 10 Fr catheter to more superior location. 9/21 blood cx 1/2 sets GPR, sent to [facility] to ID. Patient initially on IV abtx then transitioned to PO augmentin to complete 4 weeks course during discharge. EOT 10/24  He had a CT abdomen/pelvis done 10/22 with findings: residual ill-defined low-density collection posteriorly in the  right hepatic lobe, decreased in size from previous studies and most  consistent with residual phlegmon. Seen by IR on 10/24, drainage catheter was removed. He stopped abtx on 10/12 but restarted back 10/26 OV due to no complete resolution of phlegmon.  02/12/23 Taking po augmentin without any concerns or missing doses.  Denies nausea, vomiting abdominal pain and diarrhea. Appetite is good. Reports he had an endoscopy done recently and follows at [facility] for for liver cirrhosis. He has no complaints today  Review of Systems: as above  Past Medical History:  Diagnosis Date  . Abdominal pain    intermittent abdominal pain in lower abdomen  . Anemia   . Cholangitis 01/2014   with liver abscess  . Chronic back pain   . 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 pseudocyst in 08/2013. Follwed by Dr Rob Bunting and Rise Mu (at [facility]).    . Chronic pancreatitis (HCC) 09/10/2013  . Fatty liver    cirrhosis of liver never confirmed.   . Hypertension    No meds yet.  . Liver abscess 01/2014  . Pneumonia    10-30-14 [facility] admission, no problems now.  . 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   . Thrombocytopenia (HCC) 2015   with splenomegaly 03/2014.  s/p bone marrow biopsy, nonspecific findings 09/2015   Past Surgical History:  Procedure Laterality Date  . ABSCESS DRAINAGE  01/16/2023  . APPENDECTOMY     . BIOPSY  05/11/2022   Procedure: BIOPSY;  Surgeon: Jenel Lucks, MD;  Location: [facility] ENDOSCOPY;  Service: Gastroenterology;;  . COLONOSCOPY WITH PROPOFOL N/A 01/05/2015   Rachael Fee, MD;  Thre 7 to 13 mm tubular adenomatous polyps removed. repeat colonoscopy suggested in 12/2017.    Marland Kitchen ENDOSCOPIC RETROGRADE CHOLANGIOPANCREATOGRAPHY (ERCP) WITH PROPOFOL N/A 06/02/2014   Dr Rob Bunting.  removed covered metal stent.  no stricture noted.  Rec: biliary diversion surgery if recurrent stricture arises.  Marland Kitchen ERCP N/A 02/22/2014   Meryl Dare, MD; with removal of plastic stent, replaced with metal biliary stent.  filling defect: stone, sludge, blood clots removed with baloon sweep.    Marland Kitchen ERCP N/A 02/18/2014   For suspected cholangitis. Louis Meckel, MD; s/p sphincterotomy, blood clots and sludge swept out, remnant of pancreatic duct noted (removal not mentioned.  plastic stent placed into CBD.    Marland Kitchen ERCP, plastic pancreatic duct stent placement.  2007   Dr Danny Lawless at [facility].   .  ESOPHAGEAL BANDING  05/11/2022   Procedure: ESOPHAGEAL BANDING;  Surgeon: Jenel Lucks, MD;  Location: [facility] ENDOSCOPY;  Service: Gastroenterology;;  . ESOPHAGOGASTRODUODENOSCOPY N/A 05/11/2022   Procedure: ESOPHAGOGASTRODUODENOSCOPY (EGD);  Surgeon: Jenel Lucks, MD;  Location[facility] ENDOSCOPY;  Service: Gastroenterology;  Laterality: N/A;  . ESOPHAGOGASTRODUODENOSCOPY (EGD) WITH PROPOFOL N/A 09/14/2013   Dr Rhea Belton.  erosive gastritis.  bulbar ulcer.  removal of plastic biliaty stent.    . ESOPHAGOGASTRODUODENOSCOPY (EGD) WITH PROPOFOL N/A 01/05/2015   for melena. Rachael Fee.  Large distal esophageal varices, no bleeding stigmata.  no gastric varices, not banded.  Moderate to severe portal hypertensive gastropathy.  Nadolol initiated.   . ESOPHAGOGASTRODUODENOSCOPY (EGD) WITH PROPOFOL N/A 08/19/2016   Armbruster, Reeves Forth, MD; Large esophageal varices with red whale signs, high risk for  bleeding. 11 bands placed.  Portal hypertensive gastropathy.   Marland Kitchen FLEXIBLE SIGMOIDOSCOPY N/A 05/11/2022   Procedure: FLEXIBLE SIGMOIDOSCOPY;  Surgeon: Jenel Lucks, MD;  Location: [facility] ENDOSCOPY;  Service: Gastroenterology;  Laterality: N/A;  . GASTROINTESTINAL STENT REMOVAL N/A 09/14/2013   Beverley Fiedler, MD; Pancreatic stent removal  . IR CATHETER TUBE CHANGE  12/19/2022  . IR PATIENT EVAL TECH 0-60 MINS  01/16/2023     Social History   Tobacco Use  . Smoking status: Every Day    Current packs/day: 0.50    Types: Cigarettes  . Smokeless tobacco: Never  Substance Use Topics  . Alcohol use: No    Alcohol/week: 12.0 standard drinks of alcohol    Types: 12 Cans of beer per week    Comment: former ETOH abuse, quit around 2016 per pt  . Drug use: No    Family History  Problem Relation Age of Onset  . Hypertension Mother     No Known Allergies  Health Maintenance  Topic Date Due  . FOOT EXAM  Never done  . OPHTHALMOLOGY EXAM  Never done  . Diabetic kidney evaluation - Urine ACR  Never done  . Hepatitis C Screening  Never done  . COVID-19 Vaccine (3 - Moderna risk series) 03/10/2021  . INFLUENZA VACCINE  10/24/2022  . HEMOGLOBIN A1C  06/11/2023  . Diabetic kidney evaluation - eGFR measurement  01/07/2024  . Colonoscopy  01/04/2025  . DTaP/Tdap/Td (2 - Td or Tdap) 02/11/2031  . HIV Screening  Completed  . Zoster Vaccines- Shingrix  Completed  . HPV VACCINES  Aged Out    Objective: BP 128/82   Pulse 67   Temp 98.4 F (36.9 C) (Temporal)   Ht 5\' 8"  (1.727 m)   Wt 134 lb (60.8 kg)   SpO2 98%   BMI 20.37 kg/m    Physical Exam Constitutional:      Appearance: Normal appearance.  HENT:     Head: Normocephalic and atraumatic.      Mouth: Mucous membranes are moist.  Eyes:    Conjunctiva/sclera: Conjunctivae normal.     Pupils: Pupils are equal, round, and bilaterally symmetrical   Cardiovascular:     Rate and Rhythm: Normal rate and regular rhythm.     Heart  sounds:  Pulmonary:     Effort: Pulmonary effort is normal.     Breath sounds  Abdominal:     General: Non distended     Palpations: soft  Musculoskeletal:        General: Normal range of motion. Ambulatory   Skin:    General: Skin is warm and dry.     Comments:  Neurological:  General: grossly non focal     Mental Status: awake, alert and oriented to person, place, and time.   Psychiatric:        Mood and Affect: Mood normal.   Lab Results Lab Results  Component Value Date   WBC 3.2 (L) 01/07/2023   HGB 11.5 (L) 01/07/2023   HCT 35.9 (L) 01/07/2023   MCV 91.1 01/07/2023   PLT 49 (L) 01/07/2023    Lab Results  Component Value Date   CREATININE 1.00 01/07/2023   BUN 21 01/07/2023   NA 140 01/07/2023   K 4.4 01/07/2023   CL 106 01/07/2023   CO2 25 01/07/2023    Lab Results  Component Value Date   ALT 30 01/07/2023   AST 25 01/07/2023   ALKPHOS 335 (H) 12/20/2022   BILITOT 1.1 01/07/2023    Lab Results  Component Value Date   CHOL 93 09/09/2013   HDL 34 (L) 09/09/2013   LDLCALC 50 09/09/2013   TRIG 45 09/09/2013   CHOLHDL 2.7 09/09/2013   No results found for: "LABRPR", "RPRTITER" No results found for: "HIV1RNAQUANT", "HIV1RNAVL", "CD4TABS"  Microbiology Results for orders placed or performed during the hospital encounter of 12/11/22  Blood culture (routine x 2)     Status: None   Collection Time: 12/12/22  8:04 AM   Specimen: BLOOD  Result Value Ref Range Status   Specimen Description   Final    BLOOD SITE NOT SPECIFIED Performed at [facility], [address], [place], [place] [address]    Special Requests   Final    BOTTLES DRAWN AEROBIC AND ANAEROBIC Blood Culture adequate volume Performed at [facility], [address], [place], [place] [address]    Culture  Setup Time   Final    GRAM POSITIVE RODS ANAEROBIC BOTTLE ONLY CRITICAL RESULT CALLED TO, READ BACK BY AND VERIFIED WITH: PHARMD ELLEN JACKSON ON 12/13/22  @ 2315 BY DRT    Culture   Final    GRAM POSITIVE RODS SENT TO [facility] FOR IDENTIFICATION. SEE SEPARATE REPORT Performed at [facility], [address], [place], [place] [address]    Report Status 12/29/2022 FINAL  Final  Blood culture (routine x 2)     Status: None   Collection Time: 12/12/22  8:04 AM   Specimen: BLOOD  Result Value Ref Range Status   Specimen Description   Final    BLOOD SITE NOT SPECIFIED Performed at [facility], [address] [place], [place] [address]    Special Requests   Final    BOTTLES DRAWN AEROBIC AND ANAEROBIC Blood Culture adequate volume Performed at [facility], [address], [place], [place] [address]    Culture   Final    NO GROWTH 5 DAYS Performed at [facility], [address] [place], [place] [address]    Report Status 12/17/2022 FINAL  Final  SARS Coronavirus 2 by RT PCR (hospital order, performed in [facility] hospital lab) *cepheid single result test* Anterior Nasal Swab     Status: None   Collection Time: 12/12/22  8:32 AM   Specimen: Anterior Nasal Swab  Result Value Ref Range Status   SARS Coronavirus 2 by RT PCR NEGATIVE NEGATIVE Final    Comment: (NOTE) SARS-CoV-2 target nucleic acids are NOT DETECTED.  The SARS-CoV-2 RNA is generally detectable in upper and lower respiratory specimens during the acute phase of infection. The lowest concentration of SARS-CoV-2 viral  copies this assay can detect is 250 copies / mL. A negative result does not preclude SARS-CoV-2 infection and should not be used as the sole basis for treatment or other patient management decisions.  A negative result may occur with improper specimen collection / handling, submission of specimen other than nasopharyngeal swab, presence of viral mutation(s) within the areas targeted by this assay, and inadequate number of viral copies (<250 copies / mL). A negative result must be combined with  clinical observations, patient history, and epidemiological information.  Fact Sheet for Patients:   RoadLapTop.co.za  Fact Sheet for Healthcare Providers: http://kim-miller.com/  This test is not yet approved or  cleared by the [place] FDA and has been authorized for detection and/or diagnosis of SARS-CoV-2 by FDA under an Emergency Use Authorization (EUA).  This EUA will remain in effect (meaning this test can be used) for the duration of the COVID-19 declaration under Section 564(b)(1) of the Act, 21 U.S.C. section 360bbb-3(b)(1), unless the authorization is terminated or revoked sooner.  Performed at [facility], [address], [place], [place] [address]   Aerobic/Anaerobic Culture w Gram Stain (surgical/deep wound)     Status: None   Collection Time: 12/14/22 10:17 AM   Specimen: Liver; Abscess  Result Value Ref Range Status   Specimen Description   Final    LIVER ABSCESS Performed at [facility], [address] [place], [place] [address]    Special Requests   Final    NONE Performed at [facility], [address], [place], [place] [address]    Gram Stain NO WBC SEEN NO ORGANISMS SEEN   Final   Culture   Final    FEW STREPTOCOCCUS CONSTELLATUS Beta hemolytic streptococci are predictably susceptible to penicillin and other beta lactams. Susceptibility testing not routinely performed. ABUNDANT PEPTOSTREPTOCOCCUS MICROS Standardized susceptibility testing for this organism is not available. Performed at [facility], [address], [place], [place] [address]    Report Status 12/20/2022 FINAL  Final  MRSA Next Gen by PCR, Nasal     Status: None   Collection Time: 12/14/22  5:08 PM   Specimen: Nasal Mucosa; Nasal Swab  Result Value Ref Range Status   MRSA by PCR Next Gen NOT DETECTED NOT DETECTED Final    Comment: (NOTE) The GeneXpert MRSA Assay (FDA  approved for NASAL specimens only), is one component of a comprehensive MRSA colonization surveillance program. It is not intended to diagnose MRSA infection nor to guide or monitor treatment for MRSA infections. Test performance is not FDA approved in patients less than 34 years old. Performed at [facility], [address], [place], [place] [address]   Culture, blood (Routine X 2) w Reflex to ID Panel     Status: None   Collection Time: 12/17/22  9:16 AM   Specimen: BLOOD  Result Value Ref Range Status   Specimen Description   Final    BLOOD SITE NOT SPECIFIED Performed at [facility], [address] [place], [place] [address]    Special Requests   Final    BOTTLES DRAWN AEROBIC AND ANAEROBIC Blood Culture adequate volume Performed at [facility], [address], [place], [place] [address]    Culture   Final    NO GROWTH 5 DAYS Performed at [facility], [address] [place], [place] [address]    Report Status 12/22/2022 FINAL  Final  Culture, blood (Routine  X 2) w Reflex to ID Panel     Status: None   Collection Time: 12/17/22  9:16 AM   Specimen: BLOOD  Result Value Ref Range Status   Specimen Description   Final    BLOOD SITE NOT SPECIFIED Performed at [facility], [address], [place], [place] [address]    Special Requests   Final    BOTTLES DRAWN AEROBIC AND ANAEROBIC Blood Culture adequate volume Performed at [facility], [address], [place], [place] [address]    Culture   Final    NO GROWTH 5 DAYS Performed at [facility], [address], [place], [place] [address]    Report Status 12/22/2022 FINAL  Final   Imaging IR PATIENT EVAL TECH 0-60 MINS  Result Date: 01/16/2023 Edward Qualia     01/16/2023  3:13 PM The pt came in at this time to have his abscess drain removed by Beckey Downing PA-C. A new dressing was applied to the site after the  drain was removed.    CT abdomen pelvis 10/22 1. Interval advancement of the posterior percutaneous hepatic drain  which is now located more centrally and superiorly within the right  hepatic lobe. No significant residual fluid collection around the  central portion of the drain.  2. Residual ill-defined low-density collection posteriorly in the  right hepatic lobe, decreased in size from previous studies and most  consistent with residual phlegmon. No new or enlarging intrahepatic  fluid collections.  3. Interval resolution of the previously demonstrated bilateral  pleural effusions with improved aeration of both lung bases.  4. Stable sequela of chronic calcific pancreatitis.  5. 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  6.  Aortic Atherosclerosis (ICD10-I70.0).   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  - Repeat CT  9/24 with worsening abscess size. This was followed by drain exchange 9/26 with 10 Fr catheter to more superior location. - Repeat CT 10/22 Posteriorly in  the right hepatic lobe, there is a residual ill-defined low-density  collection which measures approximately 4.5 x 2.5 cm on image 12/2  (previously 8.0 x 5.0 cm). No new or enlarging intrahepatic fluid  mcollections are identified.  Plan  - continue po augmentin 875/125 mg po bid until repeat CT in a week - Fu in 1-2 weeks after repeat CT  # Medication management - labs today   # Alcoholic liver cirrhosis - needs HCC screening with US abdomen every 6 months  - EGD  04/1722 with varices + - Follows with [facility]atology at [facility]   I have personally spent  40 minutes involved in face-to-face and non-face-to-face activities for this patient on the day of the visit. Professional time spent includes the following activities: Preparing to see the patient (review of tests), Obtaining and/or reviewing separately obtained history (admission/discharge record), Performing a medically appropriate examination and/or evaluation , Ordering medications/tests/procedures, referring and communicating with other health care professionals, Documenting clinical information in the EMR, Independently interpreting results (not separately reported), Communicating results to the patient/family/caregiver, Counseling and educating the  patient/family/caregiver and Care coordination (not separately reported).   Victoriano Lain, MD [facility] 02/12/2023, 4:04 PM

## 2023-02-13 LAB — COMPREHENSIVE METABOLIC PANEL
AG Ratio: 1.5 (calc) (ref 1.0–2.5)
ALT: 55 U/L — ABNORMAL HIGH (ref 9–46)
AST: 37 U/L — ABNORMAL HIGH (ref 10–35)
Albumin: 4.2 g/dL (ref 3.6–5.1)
Alkaline phosphatase (APISO): 214 U/L — ABNORMAL HIGH (ref 35–144)
BUN: 16 mg/dL (ref 7–25)
CO2: 26 mmol/L (ref 20–32)
Calcium: 9.1 mg/dL (ref 8.6–10.3)
Chloride: 108 mmol/L (ref 98–110)
Creat: 1.1 mg/dL (ref 0.70–1.30)
Globulin: 2.8 g/dL (ref 1.9–3.7)
Glucose, Bld: 129 mg/dL — ABNORMAL HIGH (ref 65–99)
Potassium: 3.9 mmol/L (ref 3.5–5.3)
Sodium: 140 mmol/L (ref 135–146)
Total Bilirubin: 1.1 mg/dL (ref 0.2–1.2)
Total Protein: 7 g/dL (ref 6.1–8.1)

## 2023-02-13 LAB — CBC
HCT: 39.2 % (ref 38.5–50.0)
Hemoglobin: 13 g/dL — ABNORMAL LOW (ref 13.2–17.1)
MCH: 29.3 pg (ref 27.0–33.0)
MCHC: 33.2 g/dL (ref 32.0–36.0)
MCV: 88.5 fL (ref 80.0–100.0)
MPV: 11.5 fL (ref 7.5–12.5)
Platelets: 34 10*3/uL — ABNORMAL LOW (ref 140–400)
RBC: 4.43 10*6/uL (ref 4.20–5.80)
RDW: 14.1 % (ref 11.0–15.0)
WBC: 2 10*3/uL — ABNORMAL LOW (ref 3.8–10.8)

## 2023-02-13 LAB — C-REACTIVE PROTEIN: CRP: <3 mg/L (ref <8.0)

## 2023-02-13 LAB — SEDIMENTATION RATE: Sed Rate: 6 mm/h (ref 0–20)

## 2023-02-15 ENCOUNTER — Other Ambulatory Visit: Payer: Self-pay | Admitting: Infectious Diseases

## 2023-02-27 ENCOUNTER — Other Ambulatory Visit: Payer: Self-pay

## 2023-02-27 ENCOUNTER — Emergency Department (HOSPITAL_BASED_OUTPATIENT_CLINIC_OR_DEPARTMENT_OTHER)
Admission: EM | Admit: 2023-02-27 | Discharge: 2023-02-27 | Disposition: A | Discharge status: Home / Self Care | Payer: BLUE CROSS/BLUE SHIELD | Attending: Emergency Medicine | Admitting: Emergency Medicine

## 2023-02-27 DIAGNOSIS — K861 Other chronic pancreatitis: Secondary | ICD-10-CM | POA: Diagnosis not present

## 2023-02-27 DIAGNOSIS — Z7984 Long term (current) use of oral hypoglycemic drugs: Secondary | ICD-10-CM | POA: Diagnosis not present

## 2023-02-27 DIAGNOSIS — E119 Type 2 diabetes mellitus without complications: Secondary | ICD-10-CM | POA: Diagnosis not present

## 2023-02-27 DIAGNOSIS — R109 Unspecified abdominal pain: Secondary | ICD-10-CM | POA: Diagnosis present

## 2023-02-27 LAB — URINALYSIS, ROUTINE W REFLEX MICROSCOPIC
Bilirubin Urine: NEGATIVE
Glucose, UA: >=500 mg/dL — AB
Hgb urine dipstick: NEGATIVE
Ketones, ur: NEGATIVE mg/dL
Leukocytes,Ua: NEGATIVE
Nitrite: NEGATIVE
Protein, ur: NEGATIVE mg/dL
Specific Gravity, Urine: 1.02 (ref 1.005–1.030)
pH: 5.5 (ref 5.0–8.0)

## 2023-02-27 LAB — COMPREHENSIVE METABOLIC PANEL
ALT: 60 U/L — ABNORMAL HIGH (ref 0–44)
AST: 48 U/L — ABNORMAL HIGH (ref 15–41)
Albumin: 3.7 g/dL (ref 3.5–5.0)
Alkaline Phosphatase: 173 U/L — ABNORMAL HIGH (ref 38–126)
Anion gap: 8 (ref 5–15)
BUN: 15 mg/dL (ref 6–20)
CO2: 25 mmol/L (ref 22–32)
Calcium: 9 mg/dL (ref 8.9–10.3)
Chloride: 104 mmol/L (ref 98–111)
Creatinine, Ser: 0.98 mg/dL (ref 0.61–1.24)
GFR, Estimated: >60 mL/min (ref >60)
Glucose, Bld: 256 mg/dL — ABNORMAL HIGH (ref 70–99)
Potassium: 4.8 mmol/L (ref 3.5–5.1)
Sodium: 137 mmol/L (ref 135–145)
Total Bilirubin: 1.5 mg/dL — ABNORMAL HIGH (ref <1.2)
Total Protein: 7 g/dL (ref 6.5–8.1)

## 2023-02-27 LAB — URINALYSIS, MICROSCOPIC (REFLEX): Bacteria, UA: NONE SEEN

## 2023-02-27 LAB — LIPASE, BLOOD: Lipase: 19 U/L (ref 11–51)

## 2023-02-27 LAB — CBG MONITORING, ED: Glucose-Capillary: 174 mg/dL — ABNORMAL HIGH (ref 70–99)

## 2023-02-27 MED ORDER — ALUM & MAG HYDROXIDE-SIMETH 200-200-20 MG/5ML PO SUSP
30.0000 mL | Freq: Once | ORAL | Status: AC
  Administered 2023-02-27: 30 mL via ORAL
  Filled 2023-02-27: qty 30

## 2023-02-27 MED ORDER — PANTOPRAZOLE SODIUM 40 MG IV SOLR
40.0000 mg | Freq: Once | INTRAVENOUS | Status: AC
  Administered 2023-02-27: 40 mg via INTRAVENOUS
  Filled 2023-02-27: qty 10

## 2023-02-27 MED ORDER — ONDANSETRON HCL 4 MG/2ML IJ SOLN
4.0000 mg | Freq: Once | INTRAMUSCULAR | Status: AC
  Administered 2023-02-27: 4 mg via INTRAVENOUS
  Filled 2023-02-27: qty 2

## 2023-02-27 MED ORDER — HYDROMORPHONE HCL 1 MG/ML IJ SOLN
1.0000 mg | Freq: Once | INTRAMUSCULAR | Status: AC
  Administered 2023-02-27: 1 mg via INTRAVENOUS
  Filled 2023-02-27: qty 1

## 2023-02-27 MED ORDER — SODIUM CHLORIDE 0.9 % IV BOLUS
1000.0000 mL | Freq: Once | INTRAVENOUS | Status: AC
  Administered 2023-02-27: 1000 mL via INTRAVENOUS

## 2023-02-27 MED ORDER — LIDOCAINE VISCOUS HCL 2 % MT SOLN
15.0000 mL | Freq: Once | OROMUCOSAL | Status: AC
  Administered 2023-02-27: 15 mL via ORAL
  Filled 2023-02-27: qty 15

## 2023-02-27 NOTE — ED Provider Notes (Signed)
[place] [facility] Provider Note   CSN: [id number] Arrival date & time: 02/27/23  7829     History  Chief Complaint  Patient presents with   Abdominal Pain   Emesis    Lawrence Brock is a 52 y.o. male.  With a past history of alcoholic cirrhosis, esophageal varices, pancreatitis, hepatic abscess and type 2 diabetes.  To the ED for abdominal pain.  Pain began around 0600 this morning after eating breakfast.  Epigastric abdominal pain with few episodes of vomiting.  He is still reporting pain.  Currently taking Augmentin for the past month for treatment of a hepatic abscess following percutaneous IR drainage on September 26.  No fevers chills dysuria or changes in bowel habits.  Outpatient CT abdomen pelvis taken yesterday showed near complete normalization of hepatic parenchyma with resolution of fluid collection and chronic findings including pancreatitis but no acute findings on yesterday's scan.   Abdominal Pain Associated symptoms: vomiting   Emesis Associated symptoms: abdominal pain        Home Medications Prior to Admission medications   Medication Sig Start Date End Date Taking? Authorizing Provider  amoxicillin-clavulanate (AUGMENTIN) 875-125 MG tablet Take 1 tablet by mouth 2 (two) times daily. 02/12/23   Odette Fraction, MD  dicyclomine (BENTYL) 20 MG tablet Take 20 mg by mouth in the morning and at bedtime. Patient not taking: Reported on 01/21/2023    [provider]  FLUoxetine (PROZAC) 10 MG tablet Take 10 mg by mouth daily. 12/07/22   [provider]  HYDROcodone-acetaminophen (NORCO) 7.5-325 MG tablet Take 1 tablet by mouth every 6 (six) hours as needed for moderate pain.    [provider]  metFORMIN (GLUCOPHAGE) 1000 MG tablet Take 1 tablet by mouth daily with breakfast. 10/07/22   [provider]  methocarbamol (ROBAXIN) 500 MG tablet Take 500 mg by mouth 3 (three) times daily. Patient  not taking: Reported on 01/07/2023 09/23/22   [provider]  nicotine (NICODERM CQ - DOSED IN MG/24 HOURS) 21 mg/24hr patch Place 1 patch (21 mg total) onto the skin daily. Patient not taking: Reported on 01/07/2023 05/12/22   Almon Hercules, MD  pantoprazole (PROTONIX) 40 MG tablet Take 1 tablet (40 mg total) by mouth 2 (two) times daily for 30 days, THEN 1 tablet (40 mg total) daily. 05/12/22 01/21/23  Almon Hercules, MD  polyethylene glycol powder (MIRALAX) 17 GM/SCOOP powder Take 17 g by mouth 2 (two) times daily as needed for moderate constipation or mild constipation. Patient not taking: Reported on 01/07/2023 05/12/22   Almon Hercules, MD  Sodium Chloride Flush (NORMAL SALINE FLUSH) 0.9 % SOLN Use 5 ml as directed to flush abdominal drain once daily. Patient not taking: Reported on 01/07/2023 12/20/22     spironolactone (ALDACTONE) 50 MG tablet Take 50 mg by mouth daily.    [provider]  SYMBICORT 160-4.5 MCG/ACT inhaler Inhale 2 puffs into the lungs 2 (two) times daily. 11/11/22   [provider]  triamcinolone cream (KENALOG) 0.5 % Apply topically. Patient not taking: Reported on 01/07/2023 08/29/22   [provider]      Allergies    Patient has no known allergies.    Review of Systems   Review of Systems  Gastrointestinal:  Positive for abdominal pain and vomiting.    Physical Exam Updated Vital Signs BP 120/84   Pulse 71   Temp 98.2 F (36.8 C) (Oral)   Resp 17  Ht 5\' 8"  (1.727 m)   Wt 63.5 kg   SpO2 99%   BMI 21.29 kg/m  Physical Exam Vitals and nursing note reviewed.  HENT:     Head: Normocephalic and atraumatic.  Eyes:     Pupils: Pupils are equal, round, and reactive to light.  Cardiovascular:     Rate and Rhythm: Normal rate and regular rhythm.  Pulmonary:     Effort: Pulmonary effort is normal.     Breath sounds: Normal breath sounds.  Abdominal:     Palpations: Abdomen is soft.     Tenderness: There is abdominal  tenderness in the epigastric area. There is no guarding or rebound.  Skin:    General: Skin is warm and dry.  Neurological:     Mental Status: He is alert.  Psychiatric:        Mood and Affect: Mood normal.     ED Results / Procedures / Treatments   Labs (all labs ordered are listed, but only abnormal results are displayed) Labs Reviewed  COMPREHENSIVE METABOLIC PANEL - Abnormal; Notable for the following components:      Result Value   Glucose, Bld 256 (*)    AST 48 (*)    ALT 60 (*)    Alkaline Phosphatase 173 (*)    Total Bilirubin 1.5 (*)    All other components within normal limits  CBC WITH DIFFERENTIAL/PLATELET - Abnormal; Notable for the following components:   WBC 2.4 (*)    Platelets 35 (*)    Lymphs Abs 0.3 (*)    All other components within normal limits  URINALYSIS, ROUTINE W REFLEX MICROSCOPIC - Abnormal; Notable for the following components:   Glucose, UA >=500 (*)    All other components within normal limits  CBG MONITORING, ED - Abnormal; Notable for the following components:   Glucose-Capillary 174 (*)    All other components within normal limits  LIPASE, BLOOD  URINALYSIS, MICROSCOPIC (REFLEX)    EKG None  Radiology No results found.  Procedures Procedures    Medications Ordered in ED Medications  sodium chloride 0.9 % bolus 1,000 mL (0 mLs Intravenous Stopped 02/27/23 0848)  ondansetron (ZOFRAN) injection 4 mg (4 mg Intravenous Given 02/27/23 0726)  HYDROmorphone (DILAUDID) injection 1 mg (1 mg Intravenous Given 02/27/23 0744)  pantoprazole (PROTONIX) injection 40 mg (40 mg Intravenous Given 02/27/23 0840)  alum & mag hydroxide-simeth (MAALOX/MYLANTA) 200-200-20 MG/5ML suspension 30 mL (30 mLs Oral Given 02/27/23 0839)    And  lidocaine (XYLOCAINE) 2 % viscous mouth solution 15 mL (15 mLs Oral Given 02/27/23 0839)  HYDROmorphone (DILAUDID) injection 1 mg (1 mg Intravenous Given 02/27/23 0981)    ED Course/ Medical Decision Making/ A&P Clinical  Course as of 02/27/23 1137  Thu Feb 27, 2023  1133 Laboratory workup notable for hyperglycemia but otherwise numbers at his baseline.  Reports improvement in nausea and abdominal pain.  We gave him doses Protonix because he threw up his dose at home.  Stable for discharge with instruction for PCP follow-up.  Counseled him on return precautions.  Most likely etiology would be chronic pancreatitis [MP]    Clinical Course User Index [MP] Royanne Foots, DO                                 Medical Decision Making 52 year old male with extensive abdominal history as above presenting for abdominal pain and vomiting beginning this morning.  Recent hepatic abscess requiring IR drainage in late September is still taking Augmentin for this reason.  Outpatient CT scan taken yesterday at [facility] showed resolution of hepatic abscess and no other acute findings but did show chronic pancreatitis.  Afebrile and normotensive on exam.  Exam notable for abdominal tenderness.  I reviewed CT abdomen pelvis report from the scan taken yesterday.  No indication for repeat scan here today but will await laboratory workup including CBC, CMP, lipase and UA.  If there are any significant laboratory abnormalities we may need another scan here.  Will attempt symptomatic control with Zofran and Dilaudid and provide IV fluids for rehydration in the setting of acute vomiting.  Differential diagnosis includes acute on chronic pancreatitis, cholecystitis, cholangitis, gastritis, obstruction and urinary tract infection.  Amount and/or Complexity of Data Reviewed Labs: ordered.  Risk OTC drugs. Prescription drug management.           Final Clinical Impression(s) / ED Diagnoses Final diagnoses:  Chronic pancreatitis, unspecified pancreatitis type (HCC)  Abdominal pain, unspecified abdominal location    Rx / DC Orders ED Discharge Orders     None         Royanne Foots, DO 02/27/23 1137

## 2023-02-27 NOTE — Discharge Instructions (Signed)
You were seen in the emergency department for abdominal pain Your blood work looked okay I was able to see the CAT scan results from yesterday which showed resolution of your hepatic abscess and no other concerning findings No need for another CT here today As discussed, slowly advance your diet at home and continue taking all previously prescribed occasions Return to the emerged part for severe pain or any other concerns

## 2023-02-27 NOTE — ED Triage Notes (Signed)
Pt has generalized abd pain. Pt has hx of kidney stones and liver abscess and is on ABX. This morning pt began having 10/10 abd pain after eating. By the time he got to work he had emesis and abd pain. Denies diarrhea.

## 2023-03-05 ENCOUNTER — Ambulatory Visit (INDEPENDENT_AMBULATORY_CARE_PROVIDER_SITE_OTHER): Payer: BLUE CROSS/BLUE SHIELD | Admitting: Infectious Diseases

## 2023-03-05 ENCOUNTER — Other Ambulatory Visit: Payer: Self-pay

## 2023-03-05 ENCOUNTER — Encounter: Payer: Self-pay | Admitting: Infectious Diseases

## 2023-03-05 VITALS — BP 118/82 | HR 84 | Resp 16 | Ht 68.0 in | Wt 139.5 lb

## 2023-03-05 DIAGNOSIS — K750 Abscess of liver: Secondary | ICD-10-CM

## 2023-03-05 DIAGNOSIS — Z79899 Other long term (current) drug therapy: Secondary | ICD-10-CM | POA: Diagnosis not present

## 2023-03-05 NOTE — Progress Notes (Addendum)
Patient Active Problem List   Diagnosis Date Noted   Medication management 01/07/2023   Hyponatremia 12/24/2022   Chronic obstructive pulmonary disease (HCC) 12/18/2022   Gastroesophageal reflux disease 12/18/2022   Type 2 diabetes mellitus without complication, without long-term current use of insulin (HCC) 12/18/2022   Constipation 12/18/2022   Sepsis (HCC) 12/12/2022   Portal hypertensive gastropathy (HCC) 05/12/2022   Hypotension 05/11/2022   Internal hemorrhoid 05/11/2022   Acute GI bleeding 05/11/2022   Acute upper GI bleeding 05/10/2022   Syncope 05/10/2022   Abnormal finding on GI tract imaging 05/10/2022   Hematochezia 05/10/2022   Acute blood loss anemia 05/10/2022   Lactic acidosis 05/10/2022   Lower abdominal pain 05/10/2022   Chest pain 05/10/2022   Alcohol abuse, in remission 05/10/2022   Tobacco abuse 05/10/2022   Alcoholism in remission (HCC) 02/01/2022   Splenomegaly 05/30/2021   History of esophageal varices 08/13/2019   Hematemesis with nausea 08/19/2016   Bleeding esophageal varices (HCC)    Anemia, iron deficiency    Anemia 06/26/2015   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/03/2015   Bleeding gastrointestinal 12/29/2014   Alcoholic cirrhosis of liver without ascites (HCC) 12/29/2014   Portal vein thrombosis 12/29/2014   CAP (community acquired pneumonia) 10/31/2014   Pancytopenia (HCC) 10/31/2014   Pancreatitis 10/31/2014   Cholangitis    Biliary obstruction 02/22/2014   Common biliary duct stricture 02/22/2014   Common bile duct stricture 02/18/2014   Choledocholithiasis 02/18/2014   Hepatic abscess 02/16/2014   Elevated LFTs 02/15/2014   Other specified fever 02/15/2014   Duodenal ulcer 09/14/2013   Fever 09/10/2013   Hypokalemia 09/09/2013   Thrombocytopenia, unspecified (HCC) 09/09/2013   Normocytic anemia 09/09/2013   Pancreatic pseudocyst 09/09/2013   Constipation 09/09/2013    Patient's Medications  New Prescriptions    No medications on file  Previous Medications   AMOXICILLIN-CLAVULANATE (AUGMENTIN) 875-125 MG TABLET    Take 1 tablet by mouth 2 (two) times daily.   DICYCLOMINE (BENTYL) 20 MG TABLET    Take 20 mg by mouth in the morning and at bedtime.   FLUOXETINE (PROZAC) 10 MG TABLET    Take 10 mg by mouth daily.   HYDROCODONE-ACETAMINOPHEN (NORCO) 7.5-325 MG TABLET    Take 1 tablet by mouth every 6 (six) hours as needed for moderate pain.   METFORMIN (GLUCOPHAGE) 1000 MG TABLET    Take 1 tablet by mouth daily with breakfast.   METHOCARBAMOL (ROBAXIN) 500 MG TABLET    Take 500 mg by mouth 3 (three) times daily.   NICOTINE (NICODERM CQ - DOSED IN MG/24 HOURS) 21 MG/24HR PATCH    Place 1 patch (21 mg total) onto the skin daily.   PANTOPRAZOLE (PROTONIX) 40 MG TABLET    Take 1 tablet (40 mg total) by mouth 2 (two) times daily for 30 days, THEN 1 tablet (40 mg total) daily.   POLYETHYLENE GLYCOL POWDER (MIRALAX) 17 GM/SCOOP POWDER    Take 17 g by mouth 2 (two) times daily as needed for moderate constipation or mild constipation.   SODIUM CHLORIDE FLUSH (NORMAL SALINE FLUSH) 0.9 % SOLN    Use 5 ml as directed to flush abdominal drain once daily.   SPIRONOLACTONE (ALDACTONE) 50 MG TABLET    Take 50 mg by mouth daily.   SYMBICORT 160-4.5 MCG/ACT INHALER    Inhale 2 puffs into the lungs 2 (two) times daily.   TRIAMCINOLONE CREAM (KENALOG) 0.5 %    Apply topically.  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 CT abdomen/pelvis 9/19 hepatic abscesses. Seen by General surgery, GI, IR and ID. . S/p CT guided drain placement  9/21 by IR with cx growing streptococcus constellatus. Repeat CT  9/24 with worsening abscess size. This was followed by drain exchange 9/26 with 10 Fr catheter to more superior location. 9/21 blood cx 1/2 sets GPR, sent to [facility] to ID. Patient initially on IV abtx then transitioned to PO augmentin to complete 4 weeks course during discharge. EOT 10/24  He had a CT abdomen/pelvis done 10/22 with findings: residual ill-defined low-density collection posteriorly in the  right hepatic lobe, decreased in size from previous studies and most  consistent with residual phlegmon. Seen by IR on 10/24, drainage catheter was removed. He stopped abtx on 10/12 but restarted back 10/26 OV due to no complete resolution of phlegmon.  02/12/23 Taking po augmentin without any concerns or missing doses.  Denies nausea, vomiting abdominal pain and diarrhea. Appetite is good. Reports he had an endoscopy done recently and follows at [facility] for for liver cirrhosis. He has no complaints today  12/11 Seen in the ED 12/5 for abdominal pain/vomiting. Work up was with no acute abnormality in CBC, CMP, UA and normal lipase. He was managed with IVF, zofran and dilaudid. States he took medications  on an empty stomach leading to the flare. Still taking po augmentin. Repeat CT 12/4 with resolved hepatic abscess. Denies nausea, vomiting, abdominal pain or abnormal bowel movements. He is following with GI for Liver cirrhosis.   Review of Systems: as above. Denies fevers, chills.   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 pseudocyst in 08/2013. Follwed by Dr Rob Bunting and Rise Mu (at [facility]).     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     Hypertension    No meds yet.   Liver abscess 01/2014   Pneumonia    10-30-14 [facility] hospital admission, no problems now.   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    Thrombocytopenia (HCC) 2015   with splenomegaly 03/2014.  s/p bone marrow biopsy, nonspecific findings 09/2015   Past Surgical History:  Procedure Laterality Date   ABSCESS DRAINAGE  01/16/2023   APPENDECTOMY     BIOPSY  05/11/2022   Procedure: BIOPSY;  Surgeon: Jenel Lucks, MD;  Location: [facility] ENDOSCOPY;  Service: Gastroenterology;;   COLONOSCOPY WITH PROPOFOL N/A 01/05/2015   Rachael Fee, MD;  Thre 7 to 13 mm tubular adenomatous polyps removed. repeat colonoscopy suggested in 12/2017.     ENDOSCOPIC RETROGRADE CHOLANGIOPANCREATOGRAPHY (ERCP) WITH PROPOFOL N/A 06/02/2014   Dr Rob Bunting.  removed covered metal stent.  no stricture noted.  Rec: biliary diversion surgery if recurrent stricture arises.   ERCP N/A 02/22/2014   Meryl Dare, MD; with removal of plastic  stent, replaced with metal biliary stent.  filling defect: stone, sludge, blood clots removed with baloon sweep.     ERCP N/A 02/18/2014   For suspected cholangitis. Louis Meckel, MD; s/p sphincterotomy, blood clots and sludge swept out, remnant of pancreatic duct noted (removal not mentioned.  plastic stent placed into CBD.     ERCP, plastic pancreatic duct stent placement.  2007   Dr Danny Lawless at [facility].    ESOPHAGEAL BANDING  05/11/2022   Procedure: ESOPHAGEAL BANDING;  Surgeon: Jenel Lucks, MD;  Location: [facility] ENDOSCOPY;  Service: Gastroenterology;;   ESOPHAGOGASTRODUODENOSCOPY N/A 05/11/2022   Procedure: ESOPHAGOGASTRODUODENOSCOPY (EGD);  Surgeon: Jenel Lucks, MD;  Location: [facility] ENDOSCOPY;  Service: Gastroenterology;  Laterality: N/A;   ESOPHAGOGASTRODUODENOSCOPY (EGD) WITH PROPOFOL N/A 09/14/2013   Dr Rhea Belton.  erosive gastritis.  bulbar ulcer.  removal of plastic biliaty stent.      ESOPHAGOGASTRODUODENOSCOPY (EGD) WITH PROPOFOL N/A 01/05/2015   for melena. Rachael Fee.  Large distal esophageal varices, no bleeding stigmata.  no gastric varices, not banded.  Moderate to severe portal hypertensive gastropathy.  Nadolol initiated.    ESOPHAGOGASTRODUODENOSCOPY (EGD) WITH PROPOFOL N/A 08/19/2016   Armbruster, Reeves Forth, MD; Large esophageal varices with red whale signs, high risk for bleeding. 11 bands placed.  Portal hypertensive gastropathy.    FLEXIBLE SIGMOIDOSCOPY N/A 05/11/2022   Procedure: FLEXIBLE SIGMOIDOSCOPY;  Surgeon: Jenel Lucks, MD;  Location: [facility] ENDOSCOPY;  Service: Gastroenterology;  Laterality: N/A;   GASTROINTESTINAL STENT REMOVAL N/A 09/14/2013   Beverley Fiedler, MD; Pancreatic stent removal   IR CATHETER TUBE CHANGE  12/19/2022   IR PATIENT EVAL TECH 0-60 MINS  01/16/2023     Social History   Tobacco Use   Smoking status: Every Day    Current packs/day: 0.50    Types: Cigarettes   Smokeless tobacco: Never  Substance Use Topics   Alcohol use: No    Alcohol/week: 12.0 standard drinks of alcohol    Types: 12 Cans of beer per week    Comment: former ETOH abuse, quit around 2016 per pt   Drug use: No    Family History  Problem Relation Age of Onset   Hypertension Mother     No Known Allergies  Health Maintenance  Topic Date Due   FOOT EXAM  Never done   OPHTHALMOLOGY EXAM  Never done   Diabetic kidney evaluation - Urine ACR  Never done   Hepatitis C Screening  Never done   COVID-19 Vaccine (3 - Moderna risk series) 03/10/2021   INFLUENZA VACCINE  10/24/2022   HEMOGLOBIN A1C  06/11/2023   Diabetic kidney evaluation - eGFR measurement  02/27/2024   Colonoscopy  01/04/2025   DTaP/Tdap/Td (2 - Td or Tdap) 02/11/2031   HIV Screening  Completed   Zoster Vaccines- Shingrix  Completed   HPV VACCINES  Aged Out    Objective: BP 118/82   Pulse 84   Resp 16   Ht 5\' 8"  (1.727 m)   Wt 139 lb 8 oz (63.3 kg)   BMI 21.21 kg/m     Physical Exam Constitutional:      Appearance: Normal appearance.  HENT:     Head: Normocephalic and atraumatic.      Mouth: Mucous membranes are moist.  Eyes:    Conjunctiva/sclera: Conjunctivae normal.     Pupils: Pupils are equal, round, and bilaterally symmetrical   Cardiovascular:     Rate and Rhythm: Normal rate and regular rhythm.  Heart sounds:  Pulmonary:     Effort: Pulmonary effort is normal.     Breath sounds  Abdominal:     General: Non distended     Palpations: soft  Musculoskeletal:        General: Normal range of motion. Ambulatory   Skin:    General: Skin is warm and dry.     Comments:  Neurological:     General: grossly non focal     Mental Status: awake, alert and oriented to person, place, and time.   Psychiatric:        Mood and Affect: Mood normal.   Lab Results Lab Results  Component Value Date   WBC 2.4 (L) 02/27/2023   HGB 13.5 02/27/2023   HCT 42.8 02/27/2023   MCV 89.2 02/27/2023   PLT 35 (L) 02/27/2023    Lab Results  Component Value Date   CREATININE 0.98 02/27/2023   BUN 15 02/27/2023   NA 137 02/27/2023   K 4.8 02/27/2023   CL 104 02/27/2023   CO2 25 02/27/2023    Lab Results  Component Value Date   ALT 60 (H) 02/27/2023   AST 48 (H) 02/27/2023   ALKPHOS 173 (H) 02/27/2023   BILITOT 1.5 (H) 02/27/2023    Lab Results  Component Value Date   CHOL 93 09/09/2013   HDL 34 (L) 09/09/2013   LDLCALC 50 09/09/2013   TRIG 45 09/09/2013   CHOLHDL 2.7 09/09/2013   No results found for: "LABRPR", "RPRTITER" No results found for: "HIV1RNAQUANT", "HIV1RNAVL", "CD4TABS"  Microbiology Results for orders placed or performed during the hospital encounter of 12/11/22  Blood culture (routine x 2)     Status: None   Collection Time: 12/12/22  8:04 AM   Specimen: BLOOD  Result Value Ref Range Status   Specimen Description   Final    BLOOD SITE NOT SPECIFIED Performed at [facility], [address]  [place], [place] [address]    Special Requests   Final    BOTTLES DRAWN AEROBIC AND ANAEROBIC Blood Culture adequate volume Performed at [facility], [address] [place], [place] [address]    Culture  Setup Time   Final    GRAM POSITIVE RODS ANAEROBIC BOTTLE ONLY CRITICAL RESULT CALLED TO, READ BACK BY AND VERIFIED WITH: PHARMD ELLEN JACKSON ON 12/13/22 @ 2315 BY DRT    Culture   Final    GRAM POSITIVE RODS SENT TO [facility] FOR IDENTIFICATION. SEE SEPARATE REPORT Performed at [facility], [address], [place], [place] [address]    Report Status 12/29/2022 FINAL  Final  Blood culture (routine x 2)     Status: None   Collection Time: 12/12/22  8:04 AM   Specimen: BLOOD  Result Value Ref Range Status   Specimen Description   Final    BLOOD SITE NOT SPECIFIED Performed at [facility], [address], [place], [place] [address]    Special Requests   Final    BOTTLES DRAWN AEROBIC AND ANAEROBIC Blood Culture adequate volume Performed at [facility], [address], [place], [place] [address]    Culture   Final    NO GROWTH 5 DAYS Performed at [facility], [address] [place], [place] [address]    Report Status 12/17/2022 FINAL  Final  SARS Coronavirus 2 by RT PCR (hospital order, performed in [facility] hospital lab) *cepheid single result test* Anterior Nasal Swab  Status: None   Collection Time: 12/12/22  8:32 AM   Specimen: Anterior Nasal Swab  Result Value Ref Range Status   SARS Coronavirus 2 by RT PCR NEGATIVE NEGATIVE Final    Comment: (NOTE) SARS-CoV-2 target nucleic acids are NOT DETECTED.  The SARS-CoV-2 RNA is generally detectable in upper and lower respiratory specimens during the acute phase of infection. The lowest concentration of SARS-CoV-2 viral copies this assay can detect is 250 copies / mL. A negative result does not preclude SARS-CoV-2 infection and should not be used as the  sole basis for treatment or other patient management decisions.  A negative result may occur with improper specimen collection / handling, submission of specimen other than nasopharyngeal swab, presence of viral mutation(s) within the areas targeted by this assay, and inadequate number of viral copies (<250 copies / mL). A negative result must be combined with clinical observations, patient history, and epidemiological information.  Fact Sheet for Patients:   RoadLapTop.co.za  Fact Sheet for Healthcare Providers: http://kim-miller.com/  This test is not yet approved or  cleared by the [place] FDA and has been authorized for detection and/or diagnosis of SARS-CoV-2 by FDA under an Emergency Use Authorization (EUA).  This EUA will remain in effect (meaning this test can be used) for the duration of the COVID-19 declaration under Section 564(b)(1) of the Act, 21 U.S.C. section 360bbb-3(b)(1), unless the authorization is terminated or revoked sooner.  Performed at [facility], [address] [place], [place] [address]   Aerobic/Anaerobic Culture w Gram Stain (surgical/deep wound)     Status: None   Collection Time: 12/14/22 10:17 AM   Specimen: Liver; Abscess  Result Value Ref Range Status   Specimen Description   Final    LIVER ABSCESS Performed at [facility], [address], [place], [place] [address]    Special Requests   Final    NONE Performed at [facility], [address], [place], [place] [address]    Gram Stain NO WBC SEEN NO ORGANISMS SEEN   Final   Culture   Final    FEW STREPTOCOCCUS CONSTELLATUS Beta hemolytic streptococci are predictably susceptible to penicillin and other beta lactams. Susceptibility testing not routinely performed. ABUNDANT PEPTOSTREPTOCOCCUS MICROS Standardized susceptibility testing for this organism is not available. Performed at  [facility], [address], [place], [place] [address]    Report Status 12/20/2022 FINAL  Final  MRSA Next Gen by PCR, Nasal     Status: None   Collection Time: 12/14/22  5:08 PM   Specimen: Nasal Mucosa; Nasal Swab  Result Value Ref Range Status   MRSA by PCR Next Gen NOT DETECTED NOT DETECTED Final    Comment: (NOTE) The GeneXpert MRSA Assay (FDA approved for NASAL specimens only), is one component of a comprehensive MRSA colonization surveillance program. It is not intended to diagnose MRSA infection nor to guide or monitor treatment for MRSA infections. Test performance is not FDA approved in patients less than 30 years old. Performed at [facility], [address], [place], [place] [address]   Culture, blood (Routine X 2) w Reflex to ID Panel     Status: None   Collection Time: 12/17/22  9:16 AM   Specimen: BLOOD  Result Value Ref Range Status   Specimen Description   Final    BLOOD SITE NOT SPECIFIED Performed at [facility], [address], [place], [place] [address]  Special Requests   Final    BOTTLES DRAWN AEROBIC AND ANAEROBIC Blood Culture adequate volume Performed at [facility], [address], [place], [place] [address]    Culture   Final    NO GROWTH 5 DAYS Performed at [facility], [address], [place], [place] [address]    Report Status 12/22/2022 FINAL  Final  Culture, blood (Routine X 2) w Reflex to ID Panel     Status: None   Collection Time: 12/17/22  9:16 AM   Specimen: BLOOD  Result Value Ref Range Status   Specimen Description   Final    BLOOD SITE NOT SPECIFIED Performed at [facility], [address] [place], [place] [address]    Special Requests   Final    BOTTLES DRAWN AEROBIC AND ANAEROBIC Blood Culture adequate volume Performed at [facility], [address] [place], [place] [address]    Culture   Final    NO GROWTH 5  DAYS Performed at [facility], [address] [place], [place] [address]    Report Status 12/22/2022 FINAL  Final   Imaging No results found.  CT abdomen pelvis 12/4 FINDINGS:   . Lower Chest: Scattered subsegmental atelectasis/scarring.   . Liver: Interval removal of the percutaneous hepatic drain. Perhaps trace fluid along the posterior hepatic margin. Minimal residual patchy hypoattenuation in the site of prior abscess, significantly improved.  . Gallbladder/Biliary: Similar intrahepatic biliary ductal dilation.  Marland Kitchen Spleen: Splenomegaly. Hypoattenuating lesions too small to characterize.  Marland Kitchen Pancreas: Sequela of chronic pancreatitis with severely atrophic and calcified pancreatic parenchyma and pancreatic ductal dilation. High density tubular/linear material and calcifications of the head may reflect the presence of stent.  . Adrenals: Unremarkable.  . Kidneys: Similar bilateral renal cysts and additional hypoattenuating lesions too small to characterize.   Marland Kitchen Peritoneum/Mesenteries/Extraperitoneum: No free air. No free fluid or loculated drainable collection. No pathologically enlarged lymph nodes.  . Gastrointestinal tract: No evidence of obstruction. Stomach distended with semisolid material, likely reflecting a postprandial state; clinically correlate if concerned for gastroparesis.   . Ureters: Unremarkable.  . Bladder: Unremarkable.  . Reproductive System: Nonspecific prostatic calcifications.   . 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  . Musculoskeletal: No acute displaced fractures. Polyarticular degenerative changes. No aggressive focal bony lesions. Abdominal wall soft tissues unremarkable.   Impression 1.  Interval removal of the percutaneous hepatic drain. No residual formed collection. Near complete normalization of parenchymal heterogeneity.   2.  Similar chronic/ancillary findings as  above.  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  - Repeat CT  9/24 with worsening abscess size. This was followed by drain exchange 9/26 with 10 Fr catheter to more superior location. - Repeat CT 12/5 resolution of hepatic abscess   Plan  - stop augmentin  - Fu in a month to monitor off abtx.   # Medication management - 12/5 CBC and CMP reviewed ( mild transaminitis, mild elevated TB, ALP, chronic thrombocytopenia), no need for labs today as stopping  abtx  # Tobacco use - will benefit from cessation or cutting down  # Alcoholic liver cirrhosis - needs HCC screening with US abdomen every 6 months  - EGD 05/11/22 with varices + - Follows with Hepatology at [facility]   I have personally spent 30 minutes involved in face-to-face and non-face-to-face activities for this patient on the day of the visit. Professional time spent includes the following activities: Preparing to see the patient (review of tests), Obtaining and/or reviewing separately obtained history (admission/discharge record), Performing a medically appropriate examination and/or evaluation , Ordering medications/tests/procedures, referring and communicating with other health care professionals, Documenting clinical information in the EMR, Independently interpreting results (not separately reported), Communicating results to the patient/family/caregiver, Counseling and educating the patient/family/caregiver and Care coordination (not separately reported).   Victoriano Lain, MD [facility] 03/05/2023, 3:25 PM

## 2023-03-06 LAB — CBC WITH DIFFERENTIAL/PLATELET
Abs Immature Granulocytes: 0.01 10*3/uL (ref 0.00–0.07)
Basophils Absolute: 0 10*3/uL (ref 0.0–0.1)
Basophils Relative: 0 %
Eosinophils Absolute: 0.1 10*3/uL (ref 0.0–0.5)
Eosinophils Relative: 2 %
HCT: 42.8 % (ref 39.0–52.0)
Hemoglobin: 13.5 g/dL (ref 13.0–17.0)
Immature Granulocytes: 0 %
Lymphocytes Relative: 11 %
Lymphs Abs: 0.3 10*3/uL — ABNORMAL LOW (ref 0.7–4.0)
MCH: 28.1 pg (ref 26.0–34.0)
MCHC: 31.5 g/dL (ref 30.0–36.0)
MCV: 89.2 fL (ref 80.0–100.0)
Monocytes Absolute: 0.1 10*3/uL (ref 0.1–1.0)
Monocytes Relative: 5 %
Neutro Abs: 1.9 10*3/uL (ref 1.7–7.7)
Neutrophils Relative %: 82 %
Platelets: 35 10*3/uL — ABNORMAL LOW (ref 150–400)
RBC: 4.8 MIL/uL (ref 4.22–5.81)
RDW: 14.4 % (ref 11.5–15.5)
WBC: 2.4 10*3/uL — ABNORMAL LOW (ref 4.0–10.5)
nRBC: 0 % (ref 0.0–0.2)

## 2023-04-06 ENCOUNTER — Inpatient Hospital Stay (HOSPITAL_BASED_OUTPATIENT_CLINIC_OR_DEPARTMENT_OTHER)
Admission: EM | Admit: 2023-04-06 | Discharge: 2023-04-08 | DRG: 444 | Disposition: A | Discharge status: Home / Self Care | Payer: BLUE CROSS/BLUE SHIELD | Attending: Internal Medicine | Admitting: Internal Medicine

## 2023-04-06 ENCOUNTER — Emergency Department (HOSPITAL_BASED_OUTPATIENT_CLINIC_OR_DEPARTMENT_OTHER): Payer: BLUE CROSS/BLUE SHIELD

## 2023-04-06 ENCOUNTER — Encounter (HOSPITAL_BASED_OUTPATIENT_CLINIC_OR_DEPARTMENT_OTHER): Payer: Self-pay | Admitting: Emergency Medicine

## 2023-04-06 ENCOUNTER — Other Ambulatory Visit: Payer: Self-pay

## 2023-04-06 DIAGNOSIS — F17210 Nicotine dependence, cigarettes, uncomplicated: Secondary | ICD-10-CM | POA: Diagnosis present

## 2023-04-06 DIAGNOSIS — I10 Essential (primary) hypertension: Secondary | ICD-10-CM | POA: Diagnosis present

## 2023-04-06 DIAGNOSIS — I8510 Secondary esophageal varices without bleeding: Secondary | ICD-10-CM | POA: Diagnosis present

## 2023-04-06 DIAGNOSIS — Z8249 Family history of ischemic heart disease and other diseases of the circulatory system: Secondary | ICD-10-CM

## 2023-04-06 DIAGNOSIS — K861 Other chronic pancreatitis: Secondary | ICD-10-CM | POA: Diagnosis present

## 2023-04-06 DIAGNOSIS — R1011 Right upper quadrant pain: Secondary | ICD-10-CM | POA: Diagnosis not present

## 2023-04-06 DIAGNOSIS — Z7984 Long term (current) use of oral hypoglycemic drugs: Secondary | ICD-10-CM

## 2023-04-06 DIAGNOSIS — K860 Alcohol-induced chronic pancreatitis: Secondary | ICD-10-CM | POA: Diagnosis present

## 2023-04-06 DIAGNOSIS — K831 Obstruction of bile duct: Principal | ICD-10-CM | POA: Diagnosis present

## 2023-04-06 DIAGNOSIS — G8929 Other chronic pain: Secondary | ICD-10-CM | POA: Diagnosis present

## 2023-04-06 DIAGNOSIS — Z7951 Long term (current) use of inhaled steroids: Secondary | ICD-10-CM

## 2023-04-06 DIAGNOSIS — K219 Gastro-esophageal reflux disease without esophagitis: Secondary | ICD-10-CM | POA: Diagnosis present

## 2023-04-06 DIAGNOSIS — R7401 Elevation of levels of liver transaminase levels: Secondary | ICD-10-CM | POA: Diagnosis present

## 2023-04-06 DIAGNOSIS — M549 Dorsalgia, unspecified: Secondary | ICD-10-CM | POA: Diagnosis present

## 2023-04-06 DIAGNOSIS — K5900 Constipation, unspecified: Secondary | ICD-10-CM | POA: Diagnosis present

## 2023-04-06 DIAGNOSIS — K760 Fatty (change of) liver, not elsewhere classified: Secondary | ICD-10-CM | POA: Diagnosis present

## 2023-04-06 DIAGNOSIS — Z720 Tobacco use: Secondary | ICD-10-CM | POA: Diagnosis present

## 2023-04-06 DIAGNOSIS — J449 Chronic obstructive pulmonary disease, unspecified: Secondary | ICD-10-CM | POA: Diagnosis present

## 2023-04-06 DIAGNOSIS — F1021 Alcohol dependence, in remission: Secondary | ICD-10-CM

## 2023-04-06 DIAGNOSIS — K766 Portal hypertension: Secondary | ICD-10-CM | POA: Diagnosis present

## 2023-04-06 DIAGNOSIS — Z79899 Other long term (current) drug therapy: Secondary | ICD-10-CM

## 2023-04-06 DIAGNOSIS — E119 Type 2 diabetes mellitus without complications: Secondary | ICD-10-CM

## 2023-04-06 DIAGNOSIS — R109 Unspecified abdominal pain: Secondary | ICD-10-CM | POA: Diagnosis present

## 2023-04-06 DIAGNOSIS — K7030 Alcoholic cirrhosis of liver without ascites: Secondary | ICD-10-CM | POA: Diagnosis present

## 2023-04-06 DIAGNOSIS — D696 Thrombocytopenia, unspecified: Secondary | ICD-10-CM | POA: Diagnosis present

## 2023-04-06 DIAGNOSIS — I81 Portal vein thrombosis: Secondary | ICD-10-CM | POA: Diagnosis present

## 2023-04-06 LAB — COMPREHENSIVE METABOLIC PANEL
ALT: 165 U/L — ABNORMAL HIGH (ref 0–44)
AST: 151 U/L — ABNORMAL HIGH (ref 15–41)
Albumin: 4.1 g/dL (ref 3.5–5.0)
Alkaline Phosphatase: 262 U/L — ABNORMAL HIGH (ref 38–126)
Anion gap: 9 (ref 5–15)
BUN: 16 mg/dL (ref 6–20)
CO2: 19 mmol/L — ABNORMAL LOW (ref 22–32)
Calcium: 8.8 mg/dL — ABNORMAL LOW (ref 8.9–10.3)
Chloride: 105 mmol/L (ref 98–111)
Creatinine, Ser: 0.84 mg/dL (ref 0.61–1.24)
GFR, Estimated: >60 mL/min (ref >60)
Glucose, Bld: 79 mg/dL (ref 70–99)
Potassium: 3.9 mmol/L (ref 3.5–5.1)
Sodium: 133 mmol/L — ABNORMAL LOW (ref 135–145)
Total Bilirubin: 6.8 mg/dL — ABNORMAL HIGH (ref 0.0–1.2)
Total Protein: 7.7 g/dL (ref 6.5–8.1)

## 2023-04-06 LAB — CBC WITH DIFFERENTIAL/PLATELET
Abs Immature Granulocytes: 0.02 10*3/uL (ref 0.00–0.07)
Basophils Absolute: 0 10*3/uL (ref 0.0–0.1)
Basophils Relative: 0 %
Eosinophils Absolute: 0.1 10*3/uL (ref 0.0–0.5)
Eosinophils Relative: 2 %
HCT: 43.7 % (ref 39.0–52.0)
Hemoglobin: 14.5 g/dL (ref 13.0–17.0)
Immature Granulocytes: 0 %
Lymphocytes Relative: 7 %
Lymphs Abs: 0.4 10*3/uL — ABNORMAL LOW (ref 0.7–4.0)
MCH: 28.3 pg (ref 26.0–34.0)
MCHC: 33.2 g/dL (ref 30.0–36.0)
MCV: 85.2 fL (ref 80.0–100.0)
Monocytes Absolute: 0.4 10*3/uL (ref 0.1–1.0)
Monocytes Relative: 6 %
Neutro Abs: 5.8 10*3/uL (ref 1.7–7.7)
Neutrophils Relative %: 85 %
Platelets: 47 10*3/uL — ABNORMAL LOW (ref 150–400)
RBC: 5.13 MIL/uL (ref 4.22–5.81)
RDW: 15.8 % — ABNORMAL HIGH (ref 11.5–15.5)
WBC: 6.7 10*3/uL (ref 4.0–10.5)
nRBC: 0 % (ref 0.0–0.2)

## 2023-04-06 LAB — URINALYSIS, MICROSCOPIC (REFLEX)
Bacteria, UA: NONE SEEN
RBC / HPF: NONE SEEN RBC/hpf (ref 0–5)
Squamous Epithelial / HPF: NONE SEEN /[HPF] (ref 0–5)
WBC, UA: NONE SEEN WBC/hpf (ref 0–5)

## 2023-04-06 LAB — LIPASE, BLOOD: Lipase: 21 U/L (ref 11–51)

## 2023-04-06 LAB — URINALYSIS, ROUTINE W REFLEX MICROSCOPIC
Glucose, UA: >=500 mg/dL — AB
Hgb urine dipstick: NEGATIVE
Ketones, ur: NEGATIVE mg/dL
Leukocytes,Ua: NEGATIVE
Nitrite: NEGATIVE
Protein, ur: NEGATIVE mg/dL
Specific Gravity, Urine: 1.025 (ref 1.005–1.030)
pH: 5 (ref 5.0–8.0)

## 2023-04-06 MED ORDER — HYDROMORPHONE HCL 1 MG/ML IJ SOLN
1.0000 mg | Freq: Once | INTRAMUSCULAR | Status: AC
  Administered 2023-04-06: 1 mg via INTRAVENOUS
  Filled 2023-04-06: qty 1

## 2023-04-06 MED ORDER — IOHEXOL 300 MG/ML  SOLN
100.0000 mL | Freq: Once | INTRAMUSCULAR | Status: AC | PRN
  Administered 2023-04-06: 100 mL via INTRAVENOUS

## 2023-04-06 MED ORDER — ALUM & MAG HYDROXIDE-SIMETH 200-200-20 MG/5ML PO SUSP
30.0000 mL | Freq: Once | ORAL | Status: AC
  Administered 2023-04-06: 30 mL via ORAL
  Filled 2023-04-06: qty 30

## 2023-04-06 MED ORDER — PANTOPRAZOLE SODIUM 40 MG IV SOLR
40.0000 mg | Freq: Once | INTRAVENOUS | Status: AC
Start: 2023-04-06 — End: 2023-04-06
  Administered 2023-04-06: 40 mg via INTRAVENOUS
  Filled 2023-04-06: qty 10

## 2023-04-06 NOTE — ED Triage Notes (Signed)
 Pt c/o general abd pain since this morning; hx of pancreatitis and this feels similar; vomited x 1

## 2023-04-06 NOTE — ED Provider Notes (Signed)
 [place] [facility] Provider Note   CSN: [id number] Arrival date & time: 04/06/23  1650     History  Chief Complaint  Patient presents with   Abdominal Pain    Lawrence Brock is a 53 y.o. male with history of chronic back pain, chronic calcific pancreatitis with pseudocyst, HTN, cirrhosis, cholangitis with liver abscess in November 2015, portal vein thrombosis, erosive gastritis, who presents to the ER for abdominal pain starting yesterday. Pain somewhat alleviated by laying on his left side. Has had chills and fever with highest temp 101.63F at home. Has been taking tylenol . Patient's wife at bedside states his symptoms are similar to when he has liver infections or blockages. Patient had one episode of emesis this morning. No diarrhea.   States he used to drink alcohol, does not specify when he quit. He recently changed from metformin for jardiance  for diabetes, no other changes in medications. Stopped antibiotics (augmentin ) for recurrent liver abscess last month.   Admitted for recurrent liver abscess in September 2024 requiring IR placed drain, IV antibiotics, and 4 weeks of PO antibiotics. Follows with infectious disease. Follows with hepatology at [facility].    Abdominal Pain Associated symptoms: chills, fever, nausea and vomiting        Home Medications Prior to Admission medications   Medication Sig Start Date End Date Taking? Authorizing Provider  amoxicillin -clavulanate (AUGMENTIN ) 875-125 MG tablet Take 1 tablet by mouth 2 (two) times daily. 02/12/23   Manandhar, Sabina, MD  dicyclomine  (BENTYL ) 20 MG tablet Take 20 mg by mouth in the morning and at bedtime. Patient not taking: Reported on 03/05/2023    [provider]  FLUoxetine  (PROZAC ) 10 MG tablet Take 10 mg by mouth daily. 12/07/22   [provider]  HYDROcodone -acetaminophen  (NORCO) 7.5-325 MG tablet Take 1 tablet by mouth every 6 (six) hours as needed for  moderate pain.    [provider]  metFORMIN (GLUCOPHAGE) 1000 MG tablet Take 1 tablet by mouth daily with breakfast. 10/07/22   [provider]  methocarbamol  (ROBAXIN ) 500 MG tablet Take 500 mg by mouth 3 (three) times daily. Patient not taking: Reported on 01/07/2023 09/23/22   [provider]  nicotine  (NICODERM CQ  - DOSED IN MG/24 HOURS) 21 mg/24hr patch Place 1 patch (21 mg total) onto the skin daily. Patient not taking: Reported on 01/07/2023 05/12/22   Gonfa, Taye T, MD  pantoprazole  (PROTONIX ) 40 MG tablet Take 1 tablet (40 mg total) by mouth 2 (two) times daily for 30 days, THEN 1 tablet (40 mg total) daily. 05/12/22 03/05/23  Gonfa, Taye T, MD  polyethylene glycol powder (MIRALAX ) 17 GM/SCOOP powder Take 17 g by mouth 2 (two) times daily as needed for moderate constipation or mild constipation. Patient not taking: Reported on 01/07/2023 05/12/22   Gonfa, Taye T, MD  Sodium Chloride  Flush (NORMAL SALINE FLUSH) 0.9 % SOLN Use 5 ml as directed to flush abdominal drain once daily. Patient not taking: Reported on 01/07/2023 12/20/22     spironolactone  (ALDACTONE ) 50 MG tablet Take 50 mg by mouth daily.    [provider]  SYMBICORT 160-4.5 MCG/ACT inhaler Inhale 2 puffs into the lungs 2 (two) times daily. 11/11/22   [provider]  triamcinolone cream (KENALOG) 0.5 % Apply topically. Patient not taking: Reported on 01/07/2023 08/29/22   [provider]      Allergies    Patient has no known allergies.    Review of Systems  Review of Systems  Constitutional:  Positive for chills and fever.  Gastrointestinal:  Positive for abdominal pain, nausea and vomiting.  All other systems reviewed and are negative.   Physical Exam Updated Vital Signs BP 114/86 (BP Location: Right Arm)   Pulse 90   Temp 99.8 F (37.7 C) (Oral)   Resp 18   Ht 5' 5.5 (1.664 m)   Wt 65.8 kg   SpO2 97%   BMI 23.76 kg/m  Physical Exam Vitals and nursing note  reviewed.  Constitutional:      Appearance: Normal appearance.  HENT:     Head: Normocephalic and atraumatic.  Eyes:     General: Lids are normal.     Pupils: Pupils are equal, round, and reactive to light.     Comments: Scleral icterus  Cardiovascular:     Rate and Rhythm: Normal rate and regular rhythm.  Pulmonary:     Effort: Pulmonary effort is normal. No respiratory distress.     Breath sounds: Normal breath sounds.  Abdominal:     General: There is no distension.     Palpations: Abdomen is soft.     Tenderness: There is generalized abdominal tenderness and tenderness in the right upper quadrant and epigastric area. There is guarding.     Comments: Guarding to palpation of RUQ  Skin:    General: Skin is warm and dry.  Neurological:     General: No focal deficit present.     Mental Status: He is alert.     ED Results / Procedures / Treatments   Labs (all labs ordered are listed, but only abnormal results are displayed) Labs Reviewed  COMPREHENSIVE METABOLIC PANEL - Abnormal; Notable for the following components:      Result Value   Sodium 133 (*)    CO2 19 (*)    Calcium 8.8 (*)    AST 151 (*)    ALT 165 (*)    Alkaline Phosphatase 262 (*)    Total Bilirubin 6.8 (*)    All other components within normal limits  URINALYSIS, ROUTINE W REFLEX MICROSCOPIC - Abnormal; Notable for the following components:   Color, Urine ORANGE (*)    Glucose, UA >=500 (*)    Bilirubin Urine LARGE (*)    All other components within normal limits  CBC WITH DIFFERENTIAL/PLATELET - Abnormal; Notable for the following components:   RDW 15.8 (*)    Platelets 47 (*)    Lymphs Abs 0.4 (*)    All other components within normal limits  LIPASE, BLOOD  URINALYSIS, MICROSCOPIC (REFLEX)  PATHOLOGIST SMEAR REVIEW    EKG None  Radiology CT ABDOMEN PELVIS W CONTRAST Result Date: 04/06/2023 CLINICAL DATA:  Cholelithiasis concern for biliary obstruction Patient reports abdominal pain. History  of pancreatitis and this feels similar. EXAM: CT ABDOMEN AND PELVIS WITH CONTRAST TECHNIQUE: Multidetector CT imaging of the abdomen and pelvis was performed using the standard protocol following bolus administration of intravenous contrast. RADIATION DOSE REDUCTION: This exam was performed according to the departmental dose-optimization program which includes automated exposure control, adjustment of the mA and/or kV according to patient size and/or use of iterative reconstruction technique. CONTRAST:  OMNIPAQUE  IOHEXOL  300 MG/ML  SOLN COMPARISON:  CT 01/14/2023 FINDINGS: Lower chest: No focal airspace disease or pleural effusion. Hepatobiliary: Previous hepatic drain has been removed. Previous hypodensity in the posterior right lobe of the liver has resolved. No new hepatic abnormality. There is chronic intrahepatic biliary dilatation, unchanged from prior exam. The gallbladder  is physiologically distended. Mid and lower common bile duct stricture was previously characterized on MRI. No visible calcified gallstone or choledocholithiasis. Pancreas: Sequela of chronic pancreatitis with pancreatic calcifications and ductal dilatation. Probable chronic stent in the pancreatic head, unchanged. There is no definite peripancreatic fat stranding or inflammation. Spleen: Chronic splenomegaly, 17.3 cm AP. Tiny hypodensity anteriorly is unchanged. No new splenic abnormality. Adrenals/Urinary Tract: Normal adrenal glands. No hydronephrosis or renal inflammation. No renal calculi. Bilateral renal cysts. No further follow-up imaging is recommended. Unremarkable urinary bladder. Stomach/Bowel: Detailed bowel assessment is limited in the absence of enteric contrast. Distal esophageal varices. The stomach is nondistended. No small bowel obstruction or inflammation. Fecalization of distal small bowel contents. Moderate volume of stool throughout the colon. The appendix is not definitively seen. 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 The splenic vein is patent. Aortic atherosclerosis without aneurysm. No enlarged lymph nodes in the abdomen or pelvis. Reproductive: Prostate is unremarkable. Other: Chronic edema in the small bowel mesentery. No ascites or free air. Musculoskeletal: There are no acute or suspicious osseous abnormalities. IMPRESSION: 1. Interval removal of hepatic drain. Chronic intrahepatic biliary dilatation, unchanged from prior exam. Mid and lower common bile duct stricture was previously characterized on MRI. No visible calcified gallstone or choledocholithiasis. 2. Sequela of chronic pancreatitis with pancreatic calcifications and ductal dilatation. No definite peripancreatic fat stranding or inflammation. 3. 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 4. Chronic splenomegaly. Distal esophageal varices. 5. Moderate colonic stool burden with Fecalization of distal small bowel contents, can be seen with slow transit. Aortic Atherosclerosis (ICD10-I70.0). Electronically Signed   By: Andrea Gasman M.D.   On: 04/06/2023 21:39    Procedures Procedures    Medications Ordered in ED Medications  HYDROmorphone  (DILAUDID ) injection 1 mg (1 mg Intravenous Given 04/06/23 2102)  pantoprazole  (PROTONIX ) injection 40 mg (40 mg Intravenous Given 04/06/23 2102)  iohexol  (OMNIPAQUE ) 300 MG/ML solution 100 mL (100 mLs Intravenous Contrast Given 04/06/23 2126)  alum & mag hydroxide-simeth (MAALOX/MYLANTA) 200-200-20 MG/5ML suspension 30 mL (30 mLs Oral Given 04/06/23 2223)    ED Course/ Medical Decision Making/ A&P                                 Medical Decision Making Amount and/or Complexity of Data Reviewed Labs: ordered. Radiology: ordered.  Risk Prescription drug management.   This patient is a 53 y.o.  male  who presents to the ED for concern of abdominal pain.   Differential diagnoses prior to evaluation: The emergent differential diagnosis includes, but is not limited to, AAA, mesenteric ischemia, appendicitis, diverticulitis, DKA, gastroenteritis, nephrolithiasis, pancreatitis, constipation, UTI, bowel obstruction, biliary disease, IBD, PUD, hepatitis. This is not an exhaustive differential.   Past Medical History / Co-morbidities / Social History: Alcoholic liver cirrhosis, portal HTN, varices, chronic calcific pancreatitis with pseudocyst, liver abscess, HTN, COPD, DM, erosive gastritis  Additional history: Chart reviewed. Pertinent results include: Admitted for recurrent liver abscess in September 2024 requiring IR placed drain, IV antibiotics, and 4 weeks of PO antibiotics. Follows with infectious disease. Follows with hepatology at [facility].   Physical Exam: Physical exam performed. The pertinent findings include: Normal vitals. Some scleral icterus. Generalized abdominal pain, worse in epigastrium and RUQ.   Lab Tests/Imaging studies: I personally interpreted labs/imaging and  the pertinent results include:  CBC with thrombocytopenia. CMP with worsening LFTs; AST 151, ALT 165, alkaline phosphate 262, total bilirubin 6.8. UA with large bilirubin, glucose >=500. Normal lipase.  CT abdomen pelvis shows chronic intrahepatic biliary dilatation, chronic pancreatitis, chronic cavernous transformation of portal vein, chronic occlusion of superior mesenteric vein, chronic megaly.  Moderate colonic stool burden.  I agree with the radiologist interpretation.  Medications: I ordered medication including maalox, dilaudid , protonix .  I have reviewed the patients home medicines and have made adjustments as needed.  Consultations obtained: I attempted consultation with gastroenterology.   I consulted with hospitalist Dr Shona who recommended: Admission for further evaluation and GI  consultation.   Disposition: After consideration of the diagnostic results and the patients response to treatment, I feel that patient would benefit from admission for further evaluation of new hyperbilirubinemia in the setting of chronic liver and pancreatic disease.  Final Clinical Impression(s) / ED Diagnoses Final diagnoses:  Hyperbilirubinemia  Right upper quadrant abdominal pain    Rx / DC Orders ED Discharge Orders     None      Portions of this report may have been transcribed using voice recognition software. Every effort was made to ensure accuracy; however, inadvertent computerized transcription errors may be present.    Jacalynn Buzzell T, PA-C 04/06/23 2341    Albertina Dixon, MD 04/07/23 1320

## 2023-04-07 ENCOUNTER — Inpatient Hospital Stay (HOSPITAL_COMMUNITY): Payer: BLUE CROSS/BLUE SHIELD

## 2023-04-07 DIAGNOSIS — K7460 Unspecified cirrhosis of liver: Secondary | ICD-10-CM | POA: Diagnosis not present

## 2023-04-07 DIAGNOSIS — R7401 Elevation of levels of liver transaminase levels: Secondary | ICD-10-CM | POA: Diagnosis present

## 2023-04-07 DIAGNOSIS — K7290 Hepatic failure, unspecified without coma: Secondary | ICD-10-CM | POA: Diagnosis not present

## 2023-04-07 DIAGNOSIS — I8510 Secondary esophageal varices without bleeding: Secondary | ICD-10-CM | POA: Diagnosis present

## 2023-04-07 DIAGNOSIS — K831 Obstruction of bile duct: Secondary | ICD-10-CM | POA: Diagnosis present

## 2023-04-07 DIAGNOSIS — G8929 Other chronic pain: Secondary | ICD-10-CM | POA: Diagnosis present

## 2023-04-07 DIAGNOSIS — K5900 Constipation, unspecified: Secondary | ICD-10-CM | POA: Diagnosis present

## 2023-04-07 DIAGNOSIS — K760 Fatty (change of) liver, not elsewhere classified: Secondary | ICD-10-CM | POA: Diagnosis present

## 2023-04-07 DIAGNOSIS — R1011 Right upper quadrant pain: Secondary | ICD-10-CM | POA: Diagnosis present

## 2023-04-07 DIAGNOSIS — Z79899 Other long term (current) drug therapy: Secondary | ICD-10-CM | POA: Diagnosis not present

## 2023-04-07 DIAGNOSIS — R109 Unspecified abdominal pain: Secondary | ICD-10-CM

## 2023-04-07 DIAGNOSIS — F1021 Alcohol dependence, in remission: Secondary | ICD-10-CM | POA: Diagnosis present

## 2023-04-07 DIAGNOSIS — J449 Chronic obstructive pulmonary disease, unspecified: Secondary | ICD-10-CM

## 2023-04-07 DIAGNOSIS — I81 Portal vein thrombosis: Secondary | ICD-10-CM | POA: Diagnosis present

## 2023-04-07 DIAGNOSIS — I10 Essential (primary) hypertension: Secondary | ICD-10-CM | POA: Diagnosis present

## 2023-04-07 DIAGNOSIS — K766 Portal hypertension: Secondary | ICD-10-CM | POA: Diagnosis present

## 2023-04-07 DIAGNOSIS — Z7984 Long term (current) use of oral hypoglycemic drugs: Secondary | ICD-10-CM | POA: Diagnosis not present

## 2023-04-07 DIAGNOSIS — R7989 Other specified abnormal findings of blood chemistry: Secondary | ICD-10-CM

## 2023-04-07 DIAGNOSIS — K7030 Alcoholic cirrhosis of liver without ascites: Secondary | ICD-10-CM | POA: Diagnosis present

## 2023-04-07 DIAGNOSIS — Z8249 Family history of ischemic heart disease and other diseases of the circulatory system: Secondary | ICD-10-CM | POA: Diagnosis not present

## 2023-04-07 DIAGNOSIS — M549 Dorsalgia, unspecified: Secondary | ICD-10-CM | POA: Diagnosis present

## 2023-04-07 DIAGNOSIS — Z7951 Long term (current) use of inhaled steroids: Secondary | ICD-10-CM | POA: Diagnosis not present

## 2023-04-07 DIAGNOSIS — D696 Thrombocytopenia, unspecified: Secondary | ICD-10-CM | POA: Diagnosis present

## 2023-04-07 DIAGNOSIS — K219 Gastro-esophageal reflux disease without esophagitis: Secondary | ICD-10-CM | POA: Diagnosis present

## 2023-04-07 DIAGNOSIS — Z720 Tobacco use: Secondary | ICD-10-CM

## 2023-04-07 DIAGNOSIS — K861 Other chronic pancreatitis: Secondary | ICD-10-CM | POA: Diagnosis not present

## 2023-04-07 DIAGNOSIS — E119 Type 2 diabetes mellitus without complications: Secondary | ICD-10-CM | POA: Diagnosis present

## 2023-04-07 DIAGNOSIS — K860 Alcohol-induced chronic pancreatitis: Secondary | ICD-10-CM | POA: Diagnosis present

## 2023-04-07 DIAGNOSIS — F17210 Nicotine dependence, cigarettes, uncomplicated: Secondary | ICD-10-CM | POA: Diagnosis present

## 2023-04-07 LAB — PROTIME-INR
INR: 1.1 (ref 0.8–1.2)
Prothrombin Time: 14.8 s (ref 11.4–15.2)

## 2023-04-07 LAB — COMPREHENSIVE METABOLIC PANEL
ALT: 125 U/L — ABNORMAL HIGH (ref 0–44)
AST: 77 U/L — ABNORMAL HIGH (ref 15–41)
Albumin: 3.4 g/dL — ABNORMAL LOW (ref 3.5–5.0)
Alkaline Phosphatase: 231 U/L — ABNORMAL HIGH (ref 38–126)
Anion gap: 10 (ref 5–15)
BUN: 22 mg/dL — ABNORMAL HIGH (ref 6–20)
CO2: 21 mmol/L — ABNORMAL LOW (ref 22–32)
Calcium: 8.8 mg/dL — ABNORMAL LOW (ref 8.9–10.3)
Chloride: 106 mmol/L (ref 98–111)
Creatinine, Ser: 1.09 mg/dL (ref 0.61–1.24)
GFR, Estimated: >60 mL/min (ref >60)
Glucose, Bld: 70 mg/dL (ref 70–99)
Potassium: 3.6 mmol/L (ref 3.5–5.1)
Sodium: 137 mmol/L (ref 135–145)
Total Bilirubin: 4.6 mg/dL — ABNORMAL HIGH (ref 0.0–1.2)
Total Protein: 6.8 g/dL (ref 6.5–8.1)

## 2023-04-07 LAB — C-REACTIVE PROTEIN: CRP: 2 mg/dL — ABNORMAL HIGH (ref <1.0)

## 2023-04-07 MED ORDER — GADOBUTROL 1 MMOL/ML IV SOLN
6.0000 mL | Freq: Once | INTRAVENOUS | Status: AC | PRN
  Administered 2023-04-07: 6 mL via INTRAVENOUS

## 2023-04-07 MED ORDER — PANTOPRAZOLE SODIUM 40 MG IV SOLR
40.0000 mg | Freq: Two times a day (BID) | INTRAVENOUS | Status: DC
Start: 2023-04-07 — End: 2023-04-08
  Administered 2023-04-07 – 2023-04-08 (×2): 40 mg via INTRAVENOUS
  Filled 2023-04-07 (×2): qty 10

## 2023-04-07 MED ORDER — ALBUTEROL SULFATE (2.5 MG/3ML) 0.083% IN NEBU: 2.5000 mg | INHALATION_SOLUTION | Freq: Four times a day (QID) | RESPIRATORY_TRACT | Status: DC | PRN

## 2023-04-07 MED ORDER — POLYETHYLENE GLYCOL 3350 17 G PO PACK: 17.0000 g | PACK | Freq: Two times a day (BID) | ORAL | Status: DC | PRN

## 2023-04-07 MED ORDER — HYDROMORPHONE HCL 1 MG/ML IJ SOLN
1.0000 mg | INTRAMUSCULAR | Status: DC | PRN
  Administered 2023-04-07 – 2023-04-08 (×2): 1 mg via INTRAVENOUS
  Filled 2023-04-07 (×2): qty 1

## 2023-04-07 MED ORDER — OXYCODONE HCL 5 MG PO TABS: 5.0000 mg | ORAL_TABLET | ORAL | Status: DC | PRN

## 2023-04-07 MED ORDER — SPIRONOLACTONE 25 MG PO TABS
50.0000 mg | ORAL_TABLET | Freq: Every day | ORAL | Status: DC
  Administered 2023-04-07: 50 mg via ORAL
  Filled 2023-04-07: qty 2

## 2023-04-07 MED ORDER — ONDANSETRON HCL 4 MG PO TABS: 4.0000 mg | ORAL_TABLET | Freq: Four times a day (QID) | ORAL | Status: DC | PRN

## 2023-04-07 MED ORDER — FLUOXETINE HCL 20 MG PO CAPS
20.0000 mg | ORAL_CAPSULE | Freq: Every day | ORAL | Status: DC
  Administered 2023-04-07 – 2023-04-08 (×2): 20 mg via ORAL
  Filled 2023-04-07 (×2): qty 1

## 2023-04-07 MED ORDER — POLYETHYLENE GLYCOL 3350 17 G PO PACK
17.0000 g | PACK | Freq: Every day | ORAL | Status: DC
  Administered 2023-04-07: 17 g via ORAL
  Filled 2023-04-07 (×2): qty 1

## 2023-04-07 MED ORDER — ONDANSETRON HCL 4 MG/2ML IJ SOLN: 4.0000 mg | Freq: Four times a day (QID) | INTRAMUSCULAR | Status: DC | PRN

## 2023-04-07 MED ORDER — SODIUM CHLORIDE 0.9% FLUSH
3.0000 mL | Freq: Two times a day (BID) | INTRAVENOUS | Status: DC
  Administered 2023-04-07 – 2023-04-08 (×3): 3 mL via INTRAVENOUS

## 2023-04-07 MED ORDER — EMPAGLIFLOZIN 25 MG PO TABS
25.0000 mg | ORAL_TABLET | Freq: Every day | ORAL | Status: DC
  Administered 2023-04-07 – 2023-04-08 (×2): 25 mg via ORAL
  Filled 2023-04-07 (×2): qty 1

## 2023-04-07 MED ORDER — SENNOSIDES-DOCUSATE SODIUM 8.6-50 MG PO TABS
1.0000 | ORAL_TABLET | Freq: Every day | ORAL | Status: DC
Start: 2023-04-07 — End: 2023-04-08
  Administered 2023-04-07: 1 via ORAL
  Filled 2023-04-07: qty 1

## 2023-04-07 MED ORDER — NICOTINE 21 MG/24HR TD PT24
21.0000 mg | MEDICATED_PATCH | Freq: Every day | TRANSDERMAL | Status: DC
Start: 2023-04-07 — End: 2023-04-08
  Filled 2023-04-07: qty 1

## 2023-04-07 MED ORDER — MOMETASONE FURO-FORMOTEROL FUM 200-5 MCG/ACT IN AERO
2.0000 | INHALATION_SPRAY | Freq: Two times a day (BID) | RESPIRATORY_TRACT | Status: DC
  Administered 2023-04-07 – 2023-04-08 (×2): 2 via RESPIRATORY_TRACT
  Filled 2023-04-07: qty 8.8

## 2023-04-07 NOTE — ED Notes (Signed)
 Called [facility] for transport.

## 2023-04-07 NOTE — TOC CM/SW Note (Signed)
 Transition of Care [facility]) - Inpatient Brief Assessment   Patient Details  Name: Lawrence Brock MRN: [id number] Date of Birth: 02-Aug-1970  Transition of Care [facility] CM/SW Contact:    Tom-Johnson, Harvest Muskrat, RN Phone Number: 04/07/2023, 3:55 PM   Clinical Narrative:  Patient presented to the ED with Abdominal pains.  Patient has hx of Chronic Alcoholic Hepatitis, Alcoholic Cirrhosis with EV, COPD, DM, Cholangitis with recurrent Liver Abscesses and chronic SMV Thrombus. GI following, plan for MRI/MRCP for further evaluation.   From home with wife and son, has four children. Currently employed. Does not drive, uses public transportation. Does not have DME's at home.  PCP is [name], MD and uses [facility] on [address].   No TOC needs or recommendations noted at this time.  Patient not Medically ready for discharge.  CM will continue to follow as patient progresses with care towards discharge.            Transition of Care Asessment: Insurance and Status: Insurance coverage has been reviewed Patient has primary care physician: Yes Home environment has been reviewed: Yes Prior level of function:: Independent Prior/Current Home Services: No current home services Social Drivers of Health Review: SDOH reviewed no interventions necessary Readmission risk has been reviewed: Yes Transition of care needs: no transition of care needs at this time

## 2023-04-07 NOTE — Consult Note (Signed)
 Consultation  Referring Provider: Dr. Claudene    Primary Care Physician:  [name], MD Primary Gastroenterologist:   [facility] (unassigned here)      Reason for Consultation:    Abdominal Pain          HPI:   Lawrence Brock is a 53 y.o. 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     At time of presentation patient described pain localized to the thighs and lower pelvis, noted as similar to previous stomach issues described as stabbing.  Laying on his left side made this worse with accompanying symptoms including nausea, vomiting and a fever of up to 101.5 at home.    Today, patient tells me that he was doing well at home until Saturday night, 04/05/2023 when he woke up in the evening when some epigastric pain.  He tried to manage this with his at home Hydrocodone  but tells me that it did not really help so he took another 1 a few hours later as well as some Tylenol .  He then started with a fever and shaking with a temp up to 101.5 on Sunday morning 04/06/2023 and the pain started spreading across his abdomen so he was brought to the ER.  Tells me he did have an episode of vomiting yesterday but felt like it was more regurgitation of what he had tried to drink around 6 AM.  He has not tried to eat anything since then but is hungry with no further nausea or vomiting.  The Dilaudid  helps in the hospital.  Tells me he would prefer to have Oxycodone  when he  is sent home versus the Hydrocodone  as that seemed to work better for him.  He wonders if he should have an MRI as recommended by his GI specialist who he says he talked with via telemedicine a couple of weeks ago (I cannot see this report in the computer).  Currently feeling well, per him bowel movements are normal with his last 4 AM yesterday which felt complete.    Reports his last drink of alcohol was 9 years ago.    Denies weight loss or blood in his stools.  ED course: Afebrile, labs 04/06/2023 with platelet count 47, sodium 133, CO2 19, calcium 8.8, lipase 21, alk phos 262, AST 151, ALT 165 and total bili 6.8, urinalysis with large amount of bilirubin and greater than 500 glucose  CT scan of the abdomen pelvis with contrast: IMPRESSION: 1. Interval removal of hepatic drain. Chronic intrahepatic biliary dilatation, unchanged from prior exam. Mid and lower common bile duct stricture was previously characterized on MRI. No visible calcified gallstone or choledocholithiasis. 2. Sequela of chronic pancreatitis with pancreatic calcifications and ductal dilatation. No definite peripancreatic fat stranding or inflammation. 3. 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 4. Chronic splenomegaly. Distal esophageal varices. 5. Moderate colonic stool burden with Fecalization of distal small bowel contents, can be seen with slow transit. Aortic Atherosclerosis   GI history: 11/21/2022 telehealth visit  with his GI specialist at [facility] at that visit was to complete a CMP and repeat EGD in October as well as repeat imaging and CA 19-9 in 3 months  GI History noted at that visit (taken from their note): Initially seen for cholangitis 12/12/19 and underwent ERCP w/ stone extraction, stent placement. Repeat ERCP showed an edematous papilla, CBD dilation, stone extraction. Biopsies of papilla showed atypical epithelium  concerning for a dysplastic process. Due to this abnormal papilla repeat EGD w/ sideviewer 05/09/20 w/ prominent papilla, biopsies showed normal glandular mucosa. He also had variceal ligation x 3 at this time and was put on propranolol .  Dr. Gretta 05/11/20 for consideration of cholecystectomy. Given severity of his portal hypertension and risks of cholecystectomy, was recommended additional follow-up, but out of contact while working in [place] .  Admission 10/23/20 with fever and repeat stones. ERCP 11/08/20 with no obvious filling defects, stent placed. EGD w/ 3 bands on large esophageal varices.  Dr. 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  Labs 11/23/20: CA 19-9 132, CEA 2.6, AFP 7.1  EGD/ERCP 12/08/20: CBD dilation up to ~ 12 mm, impacted stone was seen in the LHD s/p EHL, stent placement. No malignancy or strictures seen. EGD w/ endoscopic variceal ligation.  Dr. Gretta 12/28/20: Considering significant comorbidities of cirrhosis, patient will be at risk of complications associated with cholecystectomy so continued observation and repeat ERCP's were recommended.  Admission 10/26-10/27/22 with concerns for cholangitis and left against medical advice.  ERCP 02/02/21: Biliary stent exchange, cholangioscopy, EHL, spybites of edematous CBD, and stone extraction with overall improvement in stone burden. CBD biopsies negative for dysplasia or malignancy.  ERCP 03/14/21 with stent exchange and then removal 05/23/21  Admission 7/18-7/21/23 with SMV thrombus and concern for pancreas mass.  MRI abdomen 10/10/21: Normal-appearing gallbladder, mild intrahepatic biliary ductal dilation most pronounced in the left hepatic lobe, CBD measures up to 9 mm with narrowing at the portal vein likely secondary to mass effect from the expanded cavernous transformation of the portal vein, splenomegaly, similar PD dilation to 5 mm which abruptly  terminates at the level of a T2 hypointense ill-defined spiculated enhancing mass centered at the pancreatic head. This mass abuts the anterior aspect of the hepatic artery without contact of the celiac or SMA. There is encasement of the portal vein and SMV. Hypointense foci within the pancreatic duct likely reflecting calculi. 6 mm left periaortic lymph node, subcentimeter gastrohepatic lymph node.  10/10/21 CA 19-9 elevated 263  EUS 10/12/21: No mass identified for sampling. Underlying chronic pancreatitis was visualized in the pancreas.  MR pancreas 10/26/21 with similar pancreas findings of this pancreas mass  Admission 10/17-10/19/23 bacteremia  EUS 02/05/22: EUS-FNA of small hypoechoic area measuring ~15-20 mm in size in the HOP proximal to a large stone- unclear if represents a mass, collection, or mass-forming chronic pancreatitis. FNA - Rare epithelial cells. Inflammation.  He was last seen in clinic 05/16/22 and was feeling well. CT at OSH 05/10/22 unchanged with mass forming chronic pancreatitis and likely distal biliary stricture.  Admission 6/27-09/23/22 with fever from unknown etiology. Labs w/ total bilirubin 2.3, AST 22, ALT 45.  CT a/p W/ 09/19/22: Hypoattenuation of the pancreatic head with atrophy of the pancreatic body and tail. These findings may reflect sequelae of chronic pancreatitis given presence of calcifications, however an underlying pancreatic head lesion/malignancy is not excluded, particularly given the intrahepatic biliary ductal dilatation. Chronic central superior mesenteric venous occlusion  with numerous venous collaterals and reconstitution of the portal vein at the portosplenic confluence. Ill-defined low attenuating soft tissue in the midabdomen located ventral to the severe narrowing of the SMV. This may reflect edematous congestion of the mesentery. peritoneal/mesenteric implants within the differential, however thought less likely.  MRCP 09/21/22: No significant  interval change in the biliary ductal dilation with tapering at the pancreatic head distally, stable when compared to MRI examinations from July and August 2023. Calcifications within the pancreatic head due to chronic pancreatitis are better seen on prior CT abdomen pelvis. Underlying mass lesion is difficult to exclude. The proximal pancreatic duct is not well visualized, similar to the previous study, likely due to chronic calcific pancreatitis though underlying mass not excluded. Similar atrophy of the pancreatic body and tail with pancreatic ductal prominence throughout.  CA 19-9 elevated 354 (09/23/22)  MRI abd [facility] 7/23: Chronic pancreatitis. Chronic dilatation of the main pancreatic duct throughout the pancreatic body and tail with abrupt caliber transition at the level of the pancreatic head, correlating to the site of bulky multifocal intraductal stones on 09/19/22 CT. No discrete pancreatic mass. Fullness of the pancreatic head described on prior CT and MRI studies correlates with marked porta hepatis and peripancreatic venous varices related to chronic SMV and portal vein thrombosis with marked cavernous transformation of the portal vein. Moderate diffuse intrahepatic biliary ductal dilatation, unchanged from recent 09/21/22 MRI, minimally increased from 10/26/21 MRI. Chronic high-grade long stricture of the mid and lower common bile duct with mildly dilated common hepatic duct (7 mm diameter), not substantially changed back to 12/10/19 MRI. No evidence of choledocholithiasis on today's scan. Suggest correlation with serum bilirubin levels. Moderate splenomegaly.  Recurrent liver abscess in September 2024 requiring IR placed drain, IV antibiotics and 4 weeks of p.o. antibiotics, follow-up with infectious disease and hepatology [facility] 12/12/2022-12/16/2022 admission to the hospital for hepatic abscess-at that time noted to have a complex history of cholangitis, choledocholithiasis, biliary stricture  with stent placement, chronic pancreatitis with maceration of the pancreas, portal hypertension with cavernous transformation of the portal vein status post variceal ligation, alcoholic cirrhosis, SMV thrombosis, hepatic abscess 2015, splenomegaly admitted for right upper quadrant and epigastric pain-at that time underwent drainage 01/08/2023 EGD done for esophageal varices: Findings of small esophageal varices in the middle and lower esophagus, mild portal hypertensive gastropathy and white mucosa at the ampulla-repeat endoscopy in a year and continue Carvedilol   Past Medical History:  Diagnosis Date   Abdominal pain    intermittent abdominal pain in lower abdomen   Anemia    Cholangitis 01/2014   with liver abscess   Chronic back pain    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 pseudocyst in 08/2013. Follwed by Dr Toribio Cedar and Nelwyn Rather (at [facility]).     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    Hypertension    No meds yet.   Liver abscess 01/2014   Pneumonia    10-30-14 [facility] hospital admission, no problems now.   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    Thrombocytopenia (HCC) 2015   with splenomegaly 03/2014.  s/p bone marrow biopsy, nonspecific findings 09/2015    Past Surgical History:  Procedure Laterality Date   ABSCESS DRAINAGE  01/16/2023   APPENDECTOMY     BIOPSY  05/11/2022   Procedure: BIOPSY;  Surgeon: Stacia Glendia BRAVO, MD;  Location: [facility] ENDOSCOPY;  Service: Gastroenterology;;   COLONOSCOPY WITH PROPOFOL  N/A 01/05/2015   Toribio SHAUNNA Cedar, MD;  Thre 7 to 13 mm tubular adenomatous polyps removed. repeat colonoscopy suggested in 12/2017.     ENDOSCOPIC RETROGRADE CHOLANGIOPANCREATOGRAPHY (ERCP) WITH PROPOFOL  N/A 06/02/2014   Dr Toribio Cedar.  removed covered metal stent.  no stricture noted.  Rec: biliary diversion surgery if recurrent stricture  arises.   ERCP N/A 02/22/2014   Gwendlyn ONEIDA Buddy, MD; with removal of plastic stent, replaced with metal biliary stent.  filling defect: stone, sludge, blood clots removed with baloon sweep.     ERCP N/A 02/18/2014   For suspected cholangitis. Lamar JONETTA Aho, MD; s/p sphincterotomy, blood clots and sludge swept out, remnant of pancreatic duct noted (removal not mentioned.  plastic stent placed into CBD.     ERCP, plastic pancreatic duct stent placement.  2007   Dr Norleen Albino at [facility].    ESOPHAGEAL BANDING  05/11/2022   Procedure: ESOPHAGEAL BANDING;  Surgeon: Stacia Glendia BRAVO, MD;  Location: [facility] ENDOSCOPY;  Service: Gastroenterology;;   ESOPHAGOGASTRODUODENOSCOPY N/A 05/11/2022   Procedure: ESOPHAGOGASTRODUODENOSCOPY (EGD);  Surgeon: Stacia Glendia BRAVO, MD;  Location: [facility] ENDOSCOPY;  Service: Gastroenterology;  Laterality: N/A;   ESOPHAGOGASTRODUODENOSCOPY (EGD) WITH PROPOFOL  N/A 09/14/2013   Dr Albertus.  erosive gastritis.  bulbar ulcer.  removal of plastic biliaty stent.     ESOPHAGOGASTRODUODENOSCOPY (EGD) WITH PROPOFOL  N/A 01/05/2015   for melena. Toribio SHAUNNA Cedar.  Large distal esophageal varices, no bleeding stigmata.  no gastric varices, not banded.  Moderate to severe portal hypertensive gastropathy.  Nadolol  initiated.    ESOPHAGOGASTRODUODENOSCOPY (EGD) WITH PROPOFOL  N/A 08/19/2016   Armbruster, Elspeth Mt, MD; Large esophageal varices with red whale signs, high risk for bleeding. 11 bands placed.  Portal hypertensive gastropathy.    FLEXIBLE SIGMOIDOSCOPY N/A 05/11/2022   Procedure: FLEXIBLE SIGMOIDOSCOPY;  Surgeon: Stacia Glendia BRAVO, MD;  Location: [facility] ENDOSCOPY;  Service: Gastroenterology;  Laterality: N/A;   GASTROINTESTINAL STENT REMOVAL N/A 09/14/2013   Gordy CHRISTELLA Albertus, MD; Pancreatic stent removal   IR CATHETER TUBE CHANGE  12/19/2022   IR PATIENT EVAL TECH 0-60 MINS  01/16/2023    Family History  Problem Relation Age of Onset   Hypertension Mother     Social  History   Tobacco Use   Smoking status: Every Day    Current packs/day: 0.50    Types: Cigarettes   Smokeless tobacco: Never  Substance Use Topics   Alcohol use: No    Alcohol/week: 12.0 standard drinks of alcohol    Types: 12 Cans of beer per week    Comment: former ETOH abuse, quit around 2016 per pt   Drug use: No    Prior to Admission medications   Medication Sig Start Date End Date Taking? Authorizing Provider  acetaminophen  (TYLENOL ) 500 MG tablet Take 1,000 mg by mouth every 6 (six) hours as needed for moderate pain (pain score 4-6).   Yes [provider]  FLUoxetine  (PROZAC ) 20 MG capsule Take 20 mg by mouth daily. 12/07/22  Yes [provider]  HYDROcodone -acetaminophen  (NORCO) 7.5-325 MG tablet Take 1 tablet by mouth every 6 (six) hours as needed for moderate pain (pain score 4-6).   Yes [provider]  JARDIANCE  25 MG TABS tablet Take 25 mg by mouth daily. 03/16/23  Yes [provider]  Multiple Vitamin (MULTIVITAMIN PO) Take 1 tablet by mouth daily.   Yes [provider]  OVER THE COUNTER MEDICATION Take 1 tablet by mouth daily.  Blood Builder Mega Fpod   Yes [provider]  pantoprazole  (PROTONIX ) 40 MG tablet Take 40 mg by mouth daily.   Yes [provider]  SYMBICORT 160-4.5 MCG/ACT inhaler Inhale 2 puffs into the lungs 2 (two) times daily. 11/11/22  Yes [provider]  metFORMIN (GLUCOPHAGE) 1000 MG tablet Take 1,000 mg by mouth daily with breakfast. Patient not taking: Reported on 04/07/2023 10/07/22   [provider]  Sodium Chloride  Flush (NORMAL SALINE FLUSH) 0.9 % SOLN Use 5 ml as directed to flush abdominal drain once daily. Patient not taking: Reported on 01/07/2023 12/20/22     spironolactone  (ALDACTONE ) 50 MG tablet Take 50 mg by mouth daily. Patient not taking: Reported on 04/07/2023    [provider]    Current Facility-Administered Medications  Medication Dose Route  Frequency Provider Last Rate Last Admin   albuterol  (PROVENTIL ) (2.5 MG/3ML) 0.083% nebulizer solution 2.5 mg  2.5 mg Nebulization Q6H PRN Claudene Maximino LABOR, MD       HYDROmorphone  (DILAUDID ) injection 1 mg  1 mg Intravenous Q3H PRN Claudene Maximino LABOR, MD       mometasone -formoterol  (DULERA ) 200-5 MCG/ACT inhaler 2 puff  2 puff Inhalation BID Palumbo, April, MD       nicotine  (NICODERM CQ  - dosed in mg/24 hours) patch 21 mg  21 mg Transdermal Daily Palumbo, April, MD       ondansetron  (ZOFRAN ) tablet 4 mg  4 mg Oral Q6H PRN Claudene Maximino LABOR, MD       Or   ondansetron  (ZOFRAN ) injection 4 mg  4 mg Intravenous Q6H PRN Claudene Maximino LABOR, MD       oxyCODONE  (Oxy IR/ROXICODONE ) immediate release tablet 5 mg  5 mg Oral Q4H PRN Smith, Rondell A, MD       polyethylene glycol (MIRALAX  / GLYCOLAX ) packet 17 g  17 g Oral BID PRN Palumbo, April, MD       sodium chloride  flush (NS) 0.9 % injection 3 mL  3 mL Intravenous Q12H Smith, Rondell A, MD       spironolactone  (ALDACTONE ) tablet 50 mg  50 mg Oral Daily Palumbo, April, MD        Allergies as of 04/06/2023   (No Known Allergies)     Review of Systems:    Constitutional: No weight loss Skin: No rash  Cardiovascular: No chest pain Respiratory: No SOB  Gastrointestinal: See HPI and otherwise negative Genitourinary: No dysuria  Neurological: No headache, dizziness or syncope Musculoskeletal: No new muscle or joint pain Hematologic: No bleeding  Psychiatric: No history of depression or anxiety    Physical Exam:  Vital signs in last 24 hours: Temp:  [97.7 F (36.5 C)-99.8 F (37.7 C)] 98.4 F (36.9 C) (01/13 0847) Pulse Rate:  [65-92] 69 (01/13 1048) Resp:  [12-20] 18 (01/13 0847) BP: (90-135)/(61-91) 104/75 (01/13 1048) SpO2:  [97 %-100 %] 100 % (01/13 0847) Weight:  [60.4 kg-65.8 kg] 60.4 kg (01/13 0847) Last BM Date : 04/06/23 General:   Pleasant male appears to be in NAD, Well developed, Well nourished, alert and cooperative Head:   Normocephalic and atraumatic. Eyes:   PEERL, EOMI. No icterus. Conjunctiva pink. Ears:  Normal auditory acuity. Neck:  Supple Throat: Oral cavity and pharynx without inflammation, swelling or lesion. Teeth in good condition. Lungs: Respirations even and unlabored. Lungs clear to auscultation bilaterally.   No wheezes, crackles, or rhonchi.  Heart: Normal S1, S2. No MRG. Regular rate and rhythm. No peripheral edema, cyanosis  or pallor.  Abdomen:  Soft, nondistended, nontender (did just receive Dilaudid ). No rebound or guarding. Normal bowel sounds. No appreciable masses or hepatomegaly. Rectal:  Not performed.  Msk:  Symmetrical without gross deformities. Peripheral pulses intact.  Extremities:  Without edema, no deformity or joint abnormality. Normal ROM, normal sensation. Neurologic:  Alert and  oriented x4;  grossly normal neurologically.  Skin:   Dry and intact without significant lesions or rashes. Psychiatric: Demonstrates good judgement and reason without abnormal affect or behaviors.   LAB RESULTS: Recent Labs    04/06/23 1821  WBC 6.7  HGB 14.5  HCT 43.7  PLT 47*   BMET Recent Labs    04/06/23 1821 04/07/23 0925  NA 133* 137  K 3.9 3.6  CL 105 106  CO2 19* 21*  GLUCOSE 79 70  BUN 16 22*  CREATININE 0.84 1.09  CALCIUM 8.8* 8.8*      Latest Ref Rng & Units 04/07/2023    9:25 AM 04/06/2023    6:21 PM 02/27/2023    7:28 AM  Hepatic Function  Total Protein 6.5 - 8.1 g/dL 6.8  7.7  7.0   Albumin 3.5 - 5.0 g/dL 3.4  4.1  3.7   AST 15 - 41 U/L 77  151  48   ALT 0 - 44 U/L 125  165  60   Alk Phosphatase 38 - 126 U/L 231  262  173   Total Bilirubin 0.0 - 1.2 mg/dL 4.6  6.8  1.5      PT/INR Recent Labs    04/07/23 0925  LABPROT 14.8  INR 1.1    STUDIES: CT ABDOMEN PELVIS W CONTRAST Result Date: 04/06/2023 CLINICAL DATA:  Cholelithiasis concern for biliary obstruction Patient reports abdominal pain. History of pancreatitis and this feels similar. EXAM: CT  ABDOMEN AND PELVIS WITH CONTRAST TECHNIQUE: Multidetector CT imaging of the abdomen and pelvis was performed using the standard protocol following bolus administration of intravenous contrast. RADIATION DOSE REDUCTION: This exam was performed according to the departmental dose-optimization program which includes automated exposure control, adjustment of the mA and/or kV according to patient size and/or use of iterative reconstruction technique. CONTRAST:  OMNIPAQUE  IOHEXOL  300 MG/ML  SOLN COMPARISON:  CT 01/14/2023 FINDINGS: Lower chest: No focal airspace disease or pleural effusion. Hepatobiliary: Previous hepatic drain has been removed. Previous hypodensity in the posterior right lobe of the liver has resolved. No new hepatic abnormality. There is chronic intrahepatic biliary dilatation, unchanged from prior exam. The gallbladder is physiologically distended. Mid and lower common bile duct stricture was previously characterized on MRI. No visible calcified gallstone or choledocholithiasis. Pancreas: Sequela of chronic pancreatitis with pancreatic calcifications and ductal dilatation. Probable chronic stent in the pancreatic head, unchanged. There is no definite peripancreatic fat stranding or inflammation. Spleen: Chronic splenomegaly, 17.3 cm AP. Tiny hypodensity anteriorly is unchanged. No new splenic abnormality. Adrenals/Urinary Tract: Normal adrenal glands. No hydronephrosis or renal inflammation. No renal calculi. Bilateral renal cysts. No further follow-up imaging is recommended. Unremarkable urinary bladder. Stomach/Bowel: Detailed bowel assessment is limited in the absence of enteric contrast. Distal esophageal varices. The stomach is nondistended. No small bowel obstruction or inflammation. Fecalization of distal small bowel contents. Moderate volume of stool throughout the colon. The appendix is not definitively seen. 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 Chronic  occlusion of the superior mesenteric vein there the portal SMV confluence. The splenic vein is patent. Aortic atherosclerosis without aneurysm. No enlarged lymph nodes in the abdomen or  pelvis. Reproductive: Prostate is unremarkable. Other: Chronic edema in the small bowel mesentery. No ascites or free air. Musculoskeletal: There are no acute or suspicious osseous abnormalities. IMPRESSION: 1. Interval removal of hepatic drain. Chronic intrahepatic biliary dilatation, unchanged from prior exam. Mid and lower common bile duct stricture was previously characterized on MRI. No visible calcified gallstone or choledocholithiasis. 2. Sequela of chronic pancreatitis with pancreatic calcifications and ductal dilatation. No definite peripancreatic fat stranding or inflammation. 3. 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 4. Chronic splenomegaly. Distal esophageal varices. 5. Moderate colonic stool burden with Fecalization of distal small bowel contents, can be seen with slow transit. Aortic Atherosclerosis (ICD10-I70.0). Electronically Signed   By: Andrea Gasman M.D.   On: 04/06/2023 21:39    Impression / Plan:   Impression: 1.  Abdominal pain: Started on the evening of 04/05/2023, worsened overnight when he presented to the ER, initial elevation in LFTs which are trending down today, history of choledocholithiasis in the past with biliary stricture; consider possible past biliary stone versus relation to all of his other chronic GI issues 2.  Presumed alcoholic cirrhosis: With history of esophageal varices last banded in October 2024, INR normal, last alcoholic drink 9 years ago 3.  Chronic pancreatitis: Presumed due to alcohol, pancreatic calcifications and ductal dilation, no definite peripancreatic fat stranding inflammation to suggest acute pancreatitis, chronic cavernous transformation of the portal vein, chronic  occlusion of the SMV, chronic splenomegaly 4.  Biliary stricture: With chronic intrahepatic biliary dilation, unchanged from prior exam on recent admission CT 5.  Constipation: On recent imaging moderate colonic stool burden with fecalization of the distal small bowel, patient reports a normal bowel movement on 04/06/2023 at 4 AM  Plan: 1.  Continue current supportive measures including pain medications. 2.  Will order an MRI/MRCP with and without contrast for further evaluation given brief rise in LFTs and history of choledocholithiasis in the past. 3.  Continue to monitor LFTs 4.  Agree with Pantoprazole  40 mg IV twice daily 5.  Will start daily MiraLAX  to make sure the constipation is not contributing to discomfort 6.  Okay with clear liquid diet today 7.  Will order chest x-ray to complete infectious workup 8.  Patient would like to discuss going home on Oxycodone  versus Hydrocodone  (will allow the hospitalist to address this at discharge)  Thank you for your kind consultation, we will continue to follow.  Delon Gibson Nahomy Limburg  04/07/2023, 11:01 AM

## 2023-04-07 NOTE — Plan of Care (Signed)
  Problem: Education: Goal: Knowledge of General Education information will improve Description: Including pain rating scale, medication(s)/side effects and non-pharmacologic comfort measures Outcome: Progressing   Problem: Clinical Measurements: Goal: Ability to maintain clinical measurements within normal limits will improve Outcome: Progressing Goal: Will remain free from infection Outcome: Progressing Goal: Diagnostic test results will improve Outcome: Progressing Goal: Respiratory complications will improve Outcome: Progressing Goal: Cardiovascular complication will be avoided Outcome: Progressing   Problem: Activity: Goal: Risk for activity intolerance will decrease Outcome: Progressing   Problem: Nutrition: Goal: Adequate nutrition will be maintained Outcome: Progressing   Problem: Pain Management: Goal: General experience of comfort will improve Outcome: Progressing   Problem: Safety: Goal: Ability to remain free from injury will improve Outcome: Progressing   Problem: Skin Integrity: Goal: Risk for impaired skin integrity will decrease Outcome: Progressing

## 2023-04-07 NOTE — Progress Notes (Signed)
 Pt admitted to unit from [facility]. He is A+OX4, VSS. Pt made comfortable in bed, call bell within reach. Will continue to monitor.

## 2023-04-07 NOTE — H&P (Addendum)
 History and Physical    PatientRhonin Brock FMW:[id number] DOB: [birth year] DOA: 04/06/2023 DOS: the patient was seen and examined on 04/07/2023 PCP: [name], MD  Patient coming from: Transfer from [facility]  Chief Complaint:  Chief Complaint  Patient presents with   Abdominal Pain   HPI: Lawrence Brock is a 53 y.o. male with medical history significant of hypertension, diabetes mellitus type 2, presumed EtOH cirrhosis withhistory of esophageal varices, chronic pancreatitis, cholangitis with liver abscess, portal vein thrombosis on anticoagulation, thrombocytopenia, anemia, tobacco abuse presented with complaints of sharp, intermittent epigastric abdominal pain that started two days ago.  Currently reports pain in the lower abdomen as well as thighs.  The patient described the pain as similar to previous stomach issues, characterizing it as 'stabbing.  Laying on his left side seemed to worsen symptoms.  Accompanying symptoms included nausea, vomiting x 1 episode yesterday, and fever up to 101.5 F at home prior to arrival.  The patient attempted to manage the symptoms with Tylenol  and hydrocodone , but reported it as ineffective. Prior to admission, the patient's GI specialist at [facility] had recommended an MRI a couple weeks ago during telehealth visit.  Denies any recent use of alcohol and quit over 9 years ago.   He was recently changed from metformin to Jardiance  for diabetes.  Denies having any significant chest pain, dysuria, or blood in stools.  Patient's last bowel movement was yesterday.  Review of records note patient was admitted back in 11/2022 due to recurrent liver abscess for which IR drain had to be placed and he required prolonged course of antibiotics.  He follows with gastroenterology at [facility].  On admission into the emergency department patient was noted to be afebrile with stable vital signs.  Labs from 1/12 noted platelet count 47, sodium 133, CO2 19, calcium  8.8, lipase 21, alkaline phosphatase 262, AST 151, ALT 165, and total bilirubin 6.8.  Urinalysis noted large bilirubin and greater than 500 glucose, but no significant signs for infection.  CT scan of the abdomen pelvis noted interval removal of the hepatic drain with chronic intra hepatic biliary dilation unchanged from prior exam with the mid to lower common bile duct stricture, chronic portal vein occlusion, chronic splenomegaly, and moderate stool burden patient has been given Protonix  40 mg IV x 1 dose, Dilaudid  1 mg IV, and GI cocktail.  Review of Systems: As mentioned in the history of present illness. All other systems reviewed and are negative. 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 pseudocyst in 08/2013. Follwed by Dr Toribio Cedar and Nelwyn Rather (at [facility]).     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    Hypertension    No meds yet.   Liver abscess 01/2014   Pneumonia    10-30-14 [facility] hospital admission, no problems now.   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    Thrombocytopenia (HCC) 2015   with splenomegaly 03/2014.  s/p bone marrow biopsy, nonspecific findings 09/2015   Past Surgical History:  Procedure Laterality Date   ABSCESS DRAINAGE  01/16/2023   APPENDECTOMY     BIOPSY  05/11/2022   Procedure: BIOPSY;  Surgeon: Stacia,  Glendia BRAVO, MD;  Location: [facility] ENDOSCOPY;  Service: Gastroenterology;;   COLONOSCOPY WITH PROPOFOL  N/A 01/05/2015   Toribio SHAUNNA Cedar, MD;  Thre 7 to 13 mm tubular adenomatous polyps removed. repeat colonoscopy suggested in 12/2017.     ENDOSCOPIC RETROGRADE CHOLANGIOPANCREATOGRAPHY (ERCP) WITH PROPOFOL  N/A 06/02/2014   Dr Toribio Cedar.  removed covered metal  stent.  no stricture noted.  Rec: biliary diversion surgery if recurrent stricture arises.   ERCP N/A 02/22/2014   Gwendlyn ONEIDA Buddy, MD; with removal of plastic stent, replaced with metal biliary stent.  filling defect: stone, sludge, blood clots removed with baloon sweep.     ERCP N/A 02/18/2014   For suspected cholangitis. Lamar JONETTA Aho, MD; s/p sphincterotomy, blood clots and sludge swept out, remnant of pancreatic duct noted (removal not mentioned.  plastic stent placed into CBD.     ERCP, plastic pancreatic duct stent placement.  2007   Dr Norleen Albino at [facility].    ESOPHAGEAL BANDING  05/11/2022   Procedure: ESOPHAGEAL BANDING;  Surgeon: Stacia Glendia BRAVO, MD;  Location: [facility] ENDOSCOPY;  Service: Gastroenterology;;   ESOPHAGOGASTRODUODENOSCOPY N/A 05/11/2022   Procedure: ESOPHAGOGASTRODUODENOSCOPY (EGD);  Surgeon: Stacia Glendia BRAVO, MD;  Location: [facility] ENDOSCOPY;  Service: Gastroenterology;  Laterality: N/A;   ESOPHAGOGASTRODUODENOSCOPY (EGD) WITH PROPOFOL  N/A 09/14/2013   Dr Albertus.  erosive gastritis.  bulbar ulcer.  removal of plastic biliaty stent.     ESOPHAGOGASTRODUODENOSCOPY (EGD) WITH PROPOFOL  N/A 01/05/2015   for melena. Toribio SHAUNNA Cedar.  Large distal esophageal varices, no bleeding stigmata.  no gastric varices, not banded.  Moderate to severe portal hypertensive gastropathy.  Nadolol  initiated.    ESOPHAGOGASTRODUODENOSCOPY (EGD) WITH PROPOFOL  N/A 08/19/2016   Armbruster, Elspeth Mt, MD; Large esophageal varices with red whale signs, high risk for bleeding. 11 bands placed.  Portal hypertensive gastropathy.    FLEXIBLE SIGMOIDOSCOPY N/A 05/11/2022   Procedure: FLEXIBLE SIGMOIDOSCOPY;  Surgeon: Stacia Glendia BRAVO, MD;  Location: [facility] ENDOSCOPY;  Service: Gastroenterology;  Laterality: N/A;   GASTROINTESTINAL STENT REMOVAL N/A 09/14/2013   Gordy CHRISTELLA Albertus, MD; Pancreatic stent removal   IR CATHETER TUBE CHANGE  12/19/2022   IR PATIENT EVAL TECH 0-60 MINS  01/16/2023   Social  History:  reports that he has been smoking cigarettes. He has never used smokeless tobacco. He reports that he does not drink alcohol and does not use drugs.  No Known Allergies  Family History  Problem Relation Age of Onset   Hypertension Mother     Prior to Admission medications   Medication Sig Start Date End Date Taking? Authorizing Provider  acetaminophen  (TYLENOL ) 500 MG tablet Take 1,000 mg by mouth every 6 (six) hours as needed for moderate pain (pain score 4-6).   Yes [provider]  FLUoxetine  (PROZAC ) 20 MG capsule Take 20 mg by mouth daily. 12/07/22  Yes [provider]  HYDROcodone -acetaminophen  (NORCO) 7.5-325 MG tablet Take 1 tablet by mouth every 6 (six) hours as needed for moderate pain (pain score 4-6).   Yes [provider]  JARDIANCE  25 MG TABS tablet Take 25 mg by mouth daily. 03/16/23  Yes [provider]  Multiple Vitamin (MULTIVITAMIN PO) Take 1 tablet by mouth daily.   Yes [provider]  OVER THE COUNTER MEDICATION Take 1 tablet by mouth daily. Blood Builder Mega Fpod   Yes [provider]  pantoprazole  (PROTONIX ) 40 MG tablet Take 40 mg by mouth daily.   Yes [provider]  SYMBICORT 160-4.5 MCG/ACT inhaler Inhale 2  puffs into the lungs 2 (two) times daily. 11/11/22  Yes [provider]  metFORMIN (GLUCOPHAGE) 1000 MG tablet Take 1,000 mg by mouth daily with breakfast. Patient not taking: Reported on 04/07/2023 10/07/22   [provider]  Sodium Chloride  Flush (NORMAL SALINE FLUSH) 0.9 % SOLN Use 5 ml as directed to flush abdominal drain once daily. Patient not taking: Reported on 01/07/2023 12/20/22     spironolactone  (ALDACTONE ) 50 MG tablet Take 50 mg by mouth daily. Patient not taking: Reported on 04/07/2023    [provider]    Physical Exam: Vitals:   04/07/23 0745 04/07/23 0747 04/07/23 0846 04/07/23 0847  BP: 102/81  90/65 90/65  Pulse: 70  76 74  Resp: 18  18 18    Temp: 98.2 F (36.8 C) 98.2 F (36.8 C) 98.4 F (36.9 C) 98.4 F (36.9 C)  TempSrc: Oral Oral Oral Oral  SpO2: 98%  97% 100%  Weight:    60.4 kg  Height:    5' 8.5 (1.74 m)   Constitutional: Middle-age male appears to be in s discomfort. Eyes: PERRL, lids and conjunctivae normal ENMT: Mucous membranes are moist. Normal dentition.  Neck: normal, supple Respiratory: clear to auscultation bilaterally, no wheezing, no crackles. Normal respiratory effort. No accessory muscle use.  Cardiovascular: Regular rate and rhythm, no murmurs / rubs / gallops. No extremity edema. 2+ pedal pulses.  Abdomen: Generalized abdominal tenderness appreciated with guarding present.  Bowel sounds appreciated. Musculoskeletal: no clubbing / cyanosis. No joint deformity upper and lower extremities. Good ROM, no contractures. Normal muscle tone.  Skin: no rashes, lesions, ulcers. No induration Neurologic: CN 2-12 grossly intact.   Strength 5/5 in all 4.  Psychiatric: Normal judgment and insight. Alert and oriented x 3. Normal mood.   Data Reviewed:  EKG revealed normal sinus rhythm at 71 bpm.  Reviewed labs, imaging, and pertinent records as documented  Assessment and Plan:   Abdominal pain Transaminitis and hyperbilirubinemia Acute on chronic.  Patient presents with complaints of fever up to 101.5 F prior to arrival and abdominal pain.  Labs significant for lipase 21, alkaline phosphatase 262, AST 151, ALT 165, and total bilirubin 6.8.  CT scan of the abdomen pelvis noted interval drain removal with chronic intrahepatic biliary duct dilation unchanged from prior exam with mid and lower common bile duct stricture.  No significant inflammatory changes surrounding the pancreas is suggested acute flare of pancreatitis.  GI had been formally consulted. -Admit to the medical telemetry bed -Soft diet -Check single blood culture -Check CRP -Check MRCP -Oxycodone /Dilaudid  IV as needed for moderate to severe pain  respectively -[facility] consulted, will follow-up for any further recommendations  Constipation  Patient reports last bowel movement was yesterday.  CT of the abdomen pelvis noted moderate stool burden with fecalization of the distal small bowel.  Unclear if this is a factor in patient's abdominal pain symptoms. -MiraLAX  daily -Senokot-S nightly  Alcoholic cirrhosis without ascites Patient with presumed alcoholic cirrhosis and a history of esophageal varices that were last banded 12/2022.  No signs of ascites on physical exam noted.  Chronic pancreatitis Lipase was within normal limits at 21.  CT notes chronic pancreatic calcifications and ductal dilatation with no stranding to suggest acute pancreatitis.  COPD, without acute exacerbation Patient without significant wheezing or rhonchi appreciated on physical exam. -Continue pharmacy substitution of Dulera  for Symbicort  Thrombocytopenia Chronic.  Platelet count 47 which appears similar to previous. -Continue to monitor  Diabetes mellitus type 2, without  long-term use of insulin  On admission glucose was noted to be 70.  Last available hemoglobin A1c was 8.4 when checked on 12/12/2022.  Patient had previously been on metformin, but was switched to Jardiance . -Continue Jardiance   Chronic SMV at portal vein thrombosis Patient not on anticoagulation.  GERD -Continue Protonix  IV twice daily  Alcohol abuse in remission Patient reports last drink 9 years ago.  Tobacco abuse Patient reports smoking a little more than half pack cigarettes per day on average.  He declined nicotine  patch at this time. -Continue to counsel need of cessation of tobacco use   DVT prophylaxis: SCDs due to thrombocytopenia Advance Care Planning:   Code Status: Full Code    Consults: [facility] Family Communication: None  Severity of Illness: The appropriate patient status for this patient is INPATIENT. Inpatient status is judged to be reasonable and  necessary in order to provide the required intensity of service to ensure the patient's safety. The patient's presenting symptoms, physical exam findings, and initial radiographic and laboratory data in the context of their chronic comorbidities is felt to place them at high risk for further clinical deterioration. Furthermore, it is not anticipated that the patient will be medically stable for discharge from the hospital within 2 midnights of admission.   * I certify that at the point of admission it is my clinical judgment that the patient will require inpatient hospital care spanning beyond 2 midnights from the point of admission due to high intensity of service, high risk for further deterioration and high frequency of surveillance required.*  Author: Maximino DELENA Sharps, MD 04/07/2023 8:57 AM  For on call review [email or web address].

## 2023-04-07 NOTE — ED Notes (Addendum)
 Attempted to call report to [facility] on 42M. She is still getting report from nightshift RN and agreed to call back.   Robina Ade, RN

## 2023-04-08 DIAGNOSIS — R1011 Right upper quadrant pain: Secondary | ICD-10-CM | POA: Diagnosis not present

## 2023-04-08 DIAGNOSIS — K7460 Unspecified cirrhosis of liver: Secondary | ICD-10-CM

## 2023-04-08 DIAGNOSIS — K7290 Hepatic failure, unspecified without coma: Secondary | ICD-10-CM | POA: Diagnosis not present

## 2023-04-08 DIAGNOSIS — K7030 Alcoholic cirrhosis of liver without ascites: Secondary | ICD-10-CM | POA: Diagnosis not present

## 2023-04-08 DIAGNOSIS — R7401 Elevation of levels of liver transaminase levels: Secondary | ICD-10-CM | POA: Diagnosis not present

## 2023-04-08 DIAGNOSIS — K861 Other chronic pancreatitis: Secondary | ICD-10-CM | POA: Diagnosis not present

## 2023-04-08 DIAGNOSIS — R109 Unspecified abdominal pain: Secondary | ICD-10-CM | POA: Diagnosis not present

## 2023-04-08 LAB — COMPREHENSIVE METABOLIC PANEL
ALT: 91 U/L — ABNORMAL HIGH (ref 0–44)
AST: 42 U/L — ABNORMAL HIGH (ref 15–41)
Albumin: 3.2 g/dL — ABNORMAL LOW (ref 3.5–5.0)
Alkaline Phosphatase: 206 U/L — ABNORMAL HIGH (ref 38–126)
Anion gap: 9 (ref 5–15)
BUN: 22 mg/dL — ABNORMAL HIGH (ref 6–20)
CO2: 23 mmol/L (ref 22–32)
Calcium: 8.8 mg/dL — ABNORMAL LOW (ref 8.9–10.3)
Chloride: 106 mmol/L (ref 98–111)
Creatinine, Ser: 1.08 mg/dL (ref 0.61–1.24)
GFR, Estimated: >60 mL/min (ref >60)
Glucose, Bld: 177 mg/dL — ABNORMAL HIGH (ref 70–99)
Potassium: 3.7 mmol/L (ref 3.5–5.1)
Sodium: 138 mmol/L (ref 135–145)
Total Bilirubin: 2.2 mg/dL — ABNORMAL HIGH (ref 0.0–1.2)
Total Protein: 6.5 g/dL (ref 6.5–8.1)

## 2023-04-08 LAB — CBC
HCT: 41.2 % (ref 39.0–52.0)
Hemoglobin: 13.9 g/dL (ref 13.0–17.0)
MCH: 28.8 pg (ref 26.0–34.0)
MCHC: 33.7 g/dL (ref 30.0–36.0)
MCV: 85.3 fL (ref 80.0–100.0)
Platelets: 38 10*3/uL — ABNORMAL LOW (ref 150–400)
RBC: 4.83 MIL/uL (ref 4.22–5.81)
RDW: 15.4 % (ref 11.5–15.5)
WBC: 2.6 10*3/uL — ABNORMAL LOW (ref 4.0–10.5)
nRBC: 0 % (ref 0.0–0.2)

## 2023-04-08 MED ORDER — OXYCODONE HCL 5 MG PO TABS: 5.0000 mg | ORAL_TABLET | Freq: Four times a day (QID) | ORAL | 0 refills | Status: DC | PRN

## 2023-04-08 NOTE — TOC Transition Note (Signed)
 Transition of Care [facility]) - Discharge Note   Patient Details  Name: Lawrence Brock MRN: [id number] Date of Birth: 08/26/1970  [facility] CM/SW Contact:  Tom-Johnson, Harvest Muskrat, RN Phone Number: 04/08/2023, 1:02 PM   Clinical Narrative:     Patient is scheduled for discharge today.  Readmission Risk Assessment done. Outpatient f/u, hospital f/u and discharge instructions on AVS. No TOC needs or recommendations noted. Wife, Cheryle to transport at discharge.  No further TOC needs noted.          Final next level of care: Home/Self Care Barriers to Discharge: Barriers Resolved   Patient Goals and CMS Choice Patient states their goals for this hospitalization and ongoing recovery are:: To return home CMS Medicare.gov Compare Post Acute Care list provided to:: Patient Choice offered to / list presented to : NA      Discharge Placement                Patient to be transferred to facility by: Wife Name of family member notified: [facility]    Discharge Plan and Services Additional resources added to the After Visit Summary for                  DME Arranged: N/A DME Agency: NA       HH Arranged: NA HH Agency: NA        Social Drivers of Health (SDOH) Interventions SDOH Screenings   Food Insecurity: No Food Insecurity (04/07/2023)  Housing: Low Risk  (04/07/2023)  Transportation Needs: No Transportation Needs (04/07/2023)  Utilities: Not At Risk (04/07/2023)  Depression (PHQ2-9): Low Risk  (03/05/2023)  Financial Resource Strain: Not on File (08/31/2018)   Received from [place], [place]  Physical Activity: Not on File (08/31/2018)   Received from [place], [place]  Social Connections: Moderately Isolated (04/07/2023)  Stress: Not on File (08/31/2018)   [facility]  Tobacco Use: High Risk (04/06/2023)     Readmission Risk Interventions    04/07/2023    3:54 PM  Readmission Risk Prevention Plan  Transportation Screening Complete   PCP or Specialist Appt within 5-7 Days Complete  Medication Review (RN CM) Referral to Pharmacy

## 2023-04-08 NOTE — Progress Notes (Addendum)
 Gastroenterology Inpatient Follow Up    Subjective: Patient states that overall he is back to his baseline.  He did have some twinges of abdominal pain this morning.  Patient has been able to eat.  Able to answer questions appropriately.  Objective: Vital signs in last 24 hours: Temp:  [97.7 F (36.5 C)-98 F (36.7 C)] 97.9 F (36.6 C) (01/14 0906) Pulse Rate:  [63-68] 63 (01/14 0906) Resp:  [18-20] 20 (01/14 0906) BP: (101-108)/(72-76) 108/72 (01/14 0906) SpO2:  [97 %-99 %] 97 % (01/14 0906) Last BM Date : 04/07/23  Intake/Output from previous day: No intake/output data recorded. Intake/Output this shift: Total I/O In: 240 [P.O.:240] Out: -   General appearance: alert, cooperative Resp: no increased WOB Cardio: regular rate GI: non-tender, non-distended Extremities: no BLE edema  Lab Results: Recent Labs    04/06/23 1821 04/08/23 0338  WBC 6.7 2.6*  HGB 14.5 13.9  HCT 43.7 41.2  PLT 47* 38*   BMET Recent Labs    04/06/23 1821 04/07/23 0925 04/08/23 0338  NA 133* 137 138  K 3.9 3.6 3.7  CL 105 106 106  CO2 19* 21* 23  GLUCOSE 79 70 177*  BUN 16 22* 22*  CREATININE 0.84 1.09 1.08  CALCIUM 8.8* 8.8* 8.8*   LFT Recent Labs    04/08/23 0338  PROT 6.5  ALBUMIN 3.2*  AST 42*  ALT 91*  ALKPHOS 206*  BILITOT 2.2*   PT/INR Recent Labs    04/07/23 0925  LABPROT 14.8  INR 1.1   Hepatitis Panel No results for input(s): HEPBSAG, HCVAB, HEPAIGM, HEPBIGM in the last 72 hours. C-Diff No results for input(s): CDIFFTOX in the last 72 hours.  Studies/Results: MR ABDOMEN MRCP W WO CONTAST Result Date: 04/07/2023 CLINICAL DATA:  Cirrhosis, elevated LFTs, jaundice EXAM: MRI ABDOMEN WITHOUT AND WITH CONTRAST (INCLUDING MRCP) TECHNIQUE: Multiplanar multisequence MR imaging of the abdomen was performed both before and after the administration of intravenous contrast. Heavily T2-weighted images of the biliary and pancreatic ducts were obtained,  and three-dimensional MRCP images were rendered by post processing. CONTRAST:  6mL GADAVIST  GADOBUTROL  1 MMOL/ML IV SOLN COMPARISON:  CT abdomen/pelvis dated 04/06/2023 FINDINGS: Lower chest: Lung bases are essentially clear. Hepatobiliary: Suspected cirrhosis. No suspicious/enhancing hepatic lesions. Moderate intrahepatic and extrahepatic duct dilatation. Narrowing/stricture of the mid/distal common duct (series 2/image 18). No choledocholithiasis is seen. Following contrast administration, there is peribiliary enhancement in the liver and along the common duct, chronic when compared to prior CTs, and favored to be related to portal vein collateralization. No restricted diffusion. Pancreas: Parenchymal atrophy with prominent pancreatic duct with bile duct ectasia. This appearance suggests sequela of prior/chronic pancreatitis, better evaluated on CT. Spleen: Splenomegaly, measuring 17.0 cm in maximal craniocaudal dimension. Adrenals/Urinary Tract:  Adrenal glands are within normal limits. Bilateral renal cysts, measuring up to 2.5 cm in the medial left lower kidney (series 3/image 40), benign (Bosniak I). No follow-up is recommended. No hydronephrosis. Stomach/Bowel: Stomach is within normal limits. Visualized bowel is grossly unremarkable. Vascular/Lymphatic:  No evidence of abdominal aortic aneurysm. Cavernous transformation of the portal vein, chronic. Collateralization particularly along the common duct. No suspicious abdominal lymphadenopathy. Other:  No abdominal ascites. Musculoskeletal: No focal osseous lesions. IMPRESSION: Cirrhosis.  No findings suspicious for HCC. Moderate intrahepatic and extrahepatic duct dilatation. Narrowing/stricture of the mid/distal common duct, chronic. No choledocholithiasis is seen. Cavernous transformation of the portal vein, chronic. Splenomegaly. Sequela of prior/chronic pancreatitis, as above. Electronically Signed   By: Sriyesh  Krishnan  M.D.   On: 04/07/2023 20:39   MR  3D Recon At Scanner Result Date: 04/07/2023 CLINICAL DATA:  Cirrhosis, elevated LFTs, jaundice EXAM: MRI ABDOMEN WITHOUT AND WITH CONTRAST (INCLUDING MRCP) TECHNIQUE: Multiplanar multisequence MR imaging of the abdomen was performed both before and after the administration of intravenous contrast. Heavily T2-weighted images of the biliary and pancreatic ducts were obtained, and three-dimensional MRCP images were rendered by post processing. CONTRAST:  6mL GADAVIST  GADOBUTROL  1 MMOL/ML IV SOLN COMPARISON:  CT abdomen/pelvis dated 04/06/2023 FINDINGS: Lower chest: Lung bases are essentially clear. Hepatobiliary: Suspected cirrhosis. No suspicious/enhancing hepatic lesions. Moderate intrahepatic and extrahepatic duct dilatation. Narrowing/stricture of the mid/distal common duct (series 2/image 18). No choledocholithiasis is seen. Following contrast administration, there is peribiliary enhancement in the liver and along the common duct, chronic when compared to prior CTs, and favored to be related to portal vein collateralization. No restricted diffusion. Pancreas: Parenchymal atrophy with prominent pancreatic duct with bile duct ectasia. This appearance suggests sequela of prior/chronic pancreatitis, better evaluated on CT. Spleen: Splenomegaly, measuring 17.0 cm in maximal craniocaudal dimension. Adrenals/Urinary Tract:  Adrenal glands are within normal limits. Bilateral renal cysts, measuring up to 2.5 cm in the medial left lower kidney (series 3/image 40), benign (Bosniak I). No follow-up is recommended. No hydronephrosis. Stomach/Bowel: Stomach is within normal limits. Visualized bowel is grossly unremarkable. Vascular/Lymphatic:  No evidence of abdominal aortic aneurysm. Cavernous transformation of the portal vein, chronic. Collateralization particularly along the common duct. No suspicious abdominal lymphadenopathy. Other:  No abdominal ascites. Musculoskeletal: No focal osseous lesions. IMPRESSION: Cirrhosis.   No findings suspicious for HCC. Moderate intrahepatic and extrahepatic duct dilatation. Narrowing/stricture of the mid/distal common duct, chronic. No choledocholithiasis is seen. Cavernous transformation of the portal vein, chronic. Splenomegaly. Sequela of prior/chronic pancreatitis, as above. Electronically Signed   By: Pinkie Pebbles M.D.   On: 04/07/2023 20:39   DG Chest 1 View Result Date: 04/07/2023 CLINICAL DATA:  Cirrhosis, elevated LFTs EXAM: CHEST  1 VIEW COMPARISON:  05/10/2022 FINDINGS: Lungs are clear.  No pleural effusion or pneumothorax. The heart is normal in size. IMPRESSION: No acute cardiopulmonary disease. Electronically Signed   By: Pinkie Pebbles M.D.   On: 04/07/2023 20:27   CT ABDOMEN PELVIS W CONTRAST Result Date: 04/06/2023 CLINICAL DATA:  Cholelithiasis concern for biliary obstruction Patient reports abdominal pain. History of pancreatitis and this feels similar. EXAM: CT ABDOMEN AND PELVIS WITH CONTRAST TECHNIQUE: Multidetector CT imaging of the abdomen and pelvis was performed using the standard protocol following bolus administration of intravenous contrast. RADIATION DOSE REDUCTION: This exam was performed according to the departmental dose-optimization program which includes automated exposure control, adjustment of the mA and/or kV according to patient size and/or use of iterative reconstruction technique. CONTRAST:  OMNIPAQUE  IOHEXOL  300 MG/ML  SOLN COMPARISON:  CT 01/14/2023 FINDINGS: Lower chest: No focal airspace disease or pleural effusion. Hepatobiliary: Previous hepatic drain has been removed. Previous hypodensity in the posterior right lobe of the liver has resolved. No new hepatic abnormality. There is chronic intrahepatic biliary dilatation, unchanged from prior exam. The gallbladder is physiologically distended. Mid and lower common bile duct stricture was previously characterized on MRI. No visible calcified gallstone or choledocholithiasis. Pancreas:  Sequela of chronic pancreatitis with pancreatic calcifications and ductal dilatation. Probable chronic stent in the pancreatic head, unchanged. There is no definite peripancreatic fat stranding or inflammation. Spleen: Chronic splenomegaly, 17.3 cm AP. Tiny hypodensity anteriorly is unchanged. No new splenic abnormality. Adrenals/Urinary Tract: Normal adrenal glands. No hydronephrosis or  renal inflammation. No renal calculi. Bilateral renal cysts. No further follow-up imaging is recommended. Unremarkable urinary bladder. Stomach/Bowel: Detailed bowel assessment is limited in the absence of enteric contrast. Distal esophageal varices. The stomach is nondistended. No small bowel obstruction or inflammation. Fecalization of distal small bowel contents. Moderate volume of stool throughout the colon. The appendix is not definitively seen. 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 The splenic vein is patent. Aortic atherosclerosis without aneurysm. No enlarged lymph nodes in the abdomen or pelvis. Reproductive: Prostate is unremarkable. Other: Chronic edema in the small bowel mesentery. No ascites or free air. Musculoskeletal: There are no acute or suspicious osseous abnormalities. IMPRESSION: 1. Interval removal of hepatic drain. Chronic intrahepatic biliary dilatation, unchanged from prior exam. Mid and lower common bile duct stricture was previously characterized on MRI. No visible calcified gallstone or choledocholithiasis. 2. Sequela of chronic pancreatitis with pancreatic calcifications and ductal dilatation. No definite peripancreatic fat stranding or inflammation. 3. 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 4. Chronic splenomegaly. Distal esophageal varices. 5. Moderate colonic stool burden with Fecalization of  distal small bowel contents, can be seen with slow transit. Aortic Atherosclerosis (ICD10-I70.0). Electronically Signed   By: Andrea Gasman M.D.   On: 04/06/2023 21:39    Medications: I have reviewed the patient's current medications. Scheduled:  empagliflozin   25 mg Oral Daily   FLUoxetine   20 mg Oral Daily   mometasone -formoterol   2 puff Inhalation BID   nicotine   21 mg Transdermal Daily   pantoprazole  (PROTONIX ) IV  40 mg Intravenous BID AC   polyethylene glycol  17 g Oral Daily   senna-docusate  1 tablet Oral QHS   sodium chloride  flush  3 mL Intravenous Q12H   Continuous: PRN:albuterol , HYDROmorphone  (DILAUDID ) injection, ondansetron  **OR** ondansetron  (ZOFRAN ) IV, oxyCODONE   Assessment/Plan: 53 year old male with history of diabetes, COPD, alcoholic cirrhosis with EV, chronic alcoholic pancreatitis, prior cholangitis with recurrent liver abscesses, 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 Patient was also found to have elevated LFTs.  Patient had a fever at home and was found to have elevated LFTs.  LFTs are now downtrending.  CT and follow-up MRI confirmed chronic biliary ductal dilation and sequela of chronic pancreatitis.  Patient was started on therapies for constipation.  Blood cultures did not have any growth for infection.  Patient did not get any antibiotic therapy.  At this time it is possible that patient had a temporary CBD obstruction related to his known chronic CBD strictures versus temporary decompensation perhaps due to a viral infection.  Patient has had significant improvement in his symptoms, and he states that he is currently back to his baseline. -Recommend that he get his LFTs rechecked with his PCP this Friday or next week to ensure that they are continuing to downtrend -Patient should follow-up with [facility] for his cirrhosis, chronic CBD strictures, and pancreatitis   LOS: 1 day   Lawrence Brock [facility] 04/08/2023, 1:37 PM

## 2023-04-08 NOTE — Progress Notes (Signed)
 Lawrence Brock to be discharged Home per MD order. Discussed prescriptions and follow up appointments with the patient. Prescriptions given to patient; medication list explained in detail. Patient verbalized understanding.  Skin clean, dry and intact without evidence of skin break down, no evidence of skin tears noted. IV catheter discontinued intact. Site without signs and symptoms of complications. Dressing and pressure applied. Pt denies pain at the site currently. No complaints noted.  Patient free of lines, drains, and wounds.   An After Visit Summary (AVS) was printed and given to the patient. Patient escorted via wheelchair, and discharged home via private auto.  Franciso Bodily, RN

## 2023-04-08 NOTE — Plan of Care (Signed)

## 2023-04-08 NOTE — Discharge Summary (Signed)
 Physician Discharge Summary  Lawrence Brock FMW:[id number] DOB: 10-27-1970 DOA: 04/06/2023  PCP: [name], MD  Admit date: 04/06/2023 Discharge date: 04/08/2023  Admitted From: Home Disposition: Home  Recommendations for Outpatient Follow-up:  Follow up with PCP in 1-2 weeks Follow-up with [facility] gastroenterology in 1-2 weeks Please obtain LFTs in 1 week Avoid all hepatotoxins Tylenol  Continue to encourage abstinence from alcohol  Home Health: No Equipment/Devices: None  Discharge Condition: Stable CODE STATUS: Full code Diet recommendation: Heart healthy diet  History of present illness:  Lawrence Brock is a 53 y.o. male with medical history significant of hypertension, diabetes mellitus type 2, presumed EtOH cirrhosis withhistory of esophageal varices, chronic pancreatitis, cholangitis with liver abscess, portal vein thrombosis on anticoagulation, thrombocytopenia, anemia, tobacco abuse presented with complaints of sharp, intermittent epigastric abdominal pain that started two days ago.  Currently reports pain in the lower abdomen as well as thighs.  The patient described the pain as similar to previous stomach issues, characterizing it as 'stabbing.  Laying on his left side seemed to worsen symptoms.  Accompanying symptoms included nausea, vomiting x 1 episode yesterday, and fever up to 101.5 F at home prior to arrival.  The patient attempted to manage the symptoms with Tylenol  and hydrocodone , but reported it as ineffective. Prior to admission, the patient's GI specialist at [facility] had recommended Lawrence MRI a couple weeks ago during telehealth visit.  Denies any recent use of alcohol and quit over 9 years ago.   He was recently changed from metformin to Jardiance  for diabetes.  Denies having any significant chest pain, dysuria, or blood in stools.  Patient's last bowel movement was yesterday.   Review of records note patient was admitted back in 11/2022 due to recurrent  liver abscess for which IR drain had to be placed and he required prolonged course of antibiotics.  He follows with gastroenterology at [facility].   On admission into the emergency department patient was noted to be afebrile with stable vital signs.  Labs from 1/12 noted platelet count 47, sodium 133, CO2 19, calcium 8.8, lipase 21, alkaline phosphatase 262, AST 151, ALT 165, and total bilirubin 6.8.  Urinalysis noted large bilirubin and greater than 500 glucose, but no significant signs for infection.  CT scan of the abdomen pelvis noted interval removal of the hepatic drain with chronic intra hepatic biliary dilation unchanged from prior exam with the mid to lower common bile duct stricture, chronic portal vein occlusion, chronic splenomegaly, and moderate stool burden patient has been given Protonix  40 mg IV x 1 dose, Dilaudid  1 mg IV, and GI cocktail.  Hospital course:  Abdominal pain Transaminitis and hyperbilirubinemia Acute on chronic.  Patient presents with complaints of fever up to 101.5 F prior to arrival and abdominal pain.  Labs significant for lipase 21, alkaline phosphatase 262, AST 151, ALT 165, and total bilirubin 6.8.  CT scan of the abdomen pelvis noted interval drain removal with chronic intrahepatic biliary duct dilation unchanged from prior exam with mid and lower common bile duct stricture.  No significant inflammatory changes surrounding the pancreas is suggested acute flare of pancreatitis.  [facility] gastroenterology was consulted and followed during hospital course.  MRCP with cirrhosis, no findings to suggest HCC, moderate intrahepatic/extrahepatic ductal dilation narrowing/stricture of the mid/distal common duct which is chronic no choledocholithiasis seen, splenomegaly, cavernous transformation of the portal vein.  Patient was supported with IV fluids, serial LFTs were monitored that trended down.  No further testing/intervention per GI with recommendation  of outpatient follow-up with  [facility] in 1-2 weeks.  GI recommends repeat LFTs 1 week.  Avoid hepatotoxins.   Constipation CT of the abdomen pelvis noted moderate stool burden with fecalization of the distal small bowel.  Unclear if this is a factor in patient's abdominal pain symptoms.  MiraLAX  daily as needed.   Alcoholic cirrhosis without ascites Patient with presumed alcoholic cirrhosis and a history of esophageal varices that were last banded 12/2022.  No signs of ascites on physical exam noted.   Chronic pancreatitis Lipase was within normal limits at 21.  CT notes chronic pancreatic calcifications and ductal dilatation with no stranding to suggest acute pancreatitis.  Tolerating diet.   COPD, without acute exacerbation Patient without significant wheezing or rhonchi appreciated on physical exam.  Continue Symbicort.  Thrombocytopenia Chronic.  Platelet count 47 which appears similar to previous.   Diabetes mellitus type 2, without long-term use of insulin  On admission glucose was noted to be 70.  Last available hemoglobin A1c was 8.4 when checked on 12/12/2022.  Patient had previously been on metformin, but was switched to Jardiance .  Outpatient follow-up with PCP.   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  Outpatient follow-up with gastroenterology.   GERD Continue PPI.   Alcohol abuse in remission Patient reports last drink 9 years ago.  Continue to encourage abstinence.   Tobacco abuse Patient reports smoking a little more than half pack cigarettes per day on average.  He declined nicotine  patch at this time.  Continue to counsel on need for complete cessation/abstinence.  Discharge Diagnoses:  Principal Problem:   Abdominal pain Active Problems:   Hyperbilirubinemia   Transaminitis   Constipation   Alcoholic cirrhosis of liver without ascites (HCC)   Chronic pancreatitis (HCC)   Chronic obstructive pulmonary disease (HCC)   Thrombocytopenia, unspecified  (HCC)   Type 2 diabetes mellitus without complication, without long-term current use of insulin  (HCC)   Portal vein thrombosis   Gastroesophageal reflux disease   Alcoholism in remission (HCC)   Tobacco abuse    Discharge Instructions  Discharge Instructions     Call MD for:  difficulty breathing, headache or visual disturbances   Complete by: As directed    Call MD for:  extreme fatigue   Complete by: As directed    Call MD for:  persistant dizziness or light-headedness   Complete by: As directed    Call MD for:  persistant nausea and vomiting   Complete by: As directed    Call MD for:  severe uncontrolled pain   Complete by: As directed    Call MD for:  temperature >100.4   Complete by: As directed    Diet - low sodium heart healthy   Complete by: As directed    Increase activity slowly   Complete by: As directed       Allergies as of 04/08/2023   No Known Allergies      Medication List     STOP taking these medications    acetaminophen  500 MG tablet Commonly known as: TYLENOL    HYDROcodone -acetaminophen  7.5-325 MG tablet Commonly known as: NORCO   Normal Saline Flush 0.9 % Soln   spironolactone  50 MG tablet Commonly known as: ALDACTONE        TAKE these medications    FLUoxetine  20 MG capsule Commonly known as: PROZAC  Take 20 mg by mouth daily.   Jardiance  25 MG Tabs tablet Generic drug: empagliflozin  Take 25 mg by mouth daily.  MULTIVITAMIN PO Take 1 tablet by mouth daily.   OVER THE COUNTER MEDICATION Take 1 tablet by mouth daily. Blood Builder Mega Fpod   oxyCODONE  5 MG immediate release tablet Commonly known as: Oxy IR/ROXICODONE  Take 1 tablet (5 mg total) by mouth every 6 (six) hours as needed for moderate pain (pain score 4-6).   pantoprazole  40 MG tablet Commonly known as: PROTONIX  Take 40 mg by mouth daily.   Symbicort 160-4.5 MCG/ACT inhaler Generic drug: budesonide-formoterol  Inhale 2 puffs into the lungs 2 (two) times  daily.        Follow-up Information     [name], MD. Schedule [place] appointment as soon as possible for a visit in 1 week(s).   Specialty: Family Medicine Why: Need follow-up liver function test that visit Contact information: [address] [facility] [place] [address] [phone]         Gretel Estefana BRAVO, MD. Schedule [place] appointment as soon as possible for a visit in 1 week(s).   Specialty: Gastroenterology Contact information: [address] [place] [address] [phone]                No Known Allergies  Consultations: [facility]   Procedures/Studies: MR ABDOMEN MRCP W WO CONTAST Result Date: 04/07/2023 CLINICAL DATA:  Cirrhosis, elevated LFTs, jaundice EXAM: MRI ABDOMEN WITHOUT AND WITH CONTRAST (INCLUDING MRCP) TECHNIQUE: Multiplanar multisequence MR imaging of the abdomen was performed both before and after the administration of intravenous contrast. Heavily T2-weighted images of the biliary and pancreatic ducts were obtained, and three-dimensional MRCP images were rendered by post processing. CONTRAST:  6mL GADAVIST  GADOBUTROL  1 MMOL/ML IV SOLN COMPARISON:  CT abdomen/pelvis dated 04/06/2023 FINDINGS: Lower chest: Lung bases are essentially clear. Hepatobiliary: Suspected cirrhosis. No suspicious/enhancing hepatic lesions. Moderate intrahepatic and extrahepatic duct dilatation. Narrowing/stricture of the mid/distal common duct (series 2/image 18). No choledocholithiasis is seen. Following contrast administration, there is peribiliary enhancement in the liver and along the common duct, chronic when compared to prior CTs, and favored to be related to portal vein collateralization. No restricted diffusion. Pancreas: Parenchymal atrophy with prominent pancreatic duct with bile duct ectasia. This appearance suggests sequela of prior/chronic pancreatitis, better evaluated on CT. Spleen: Splenomegaly,  measuring 17.0 cm in maximal craniocaudal dimension. Adrenals/Urinary Tract:  Adrenal glands are within normal limits. Bilateral renal cysts, measuring up to 2.5 cm in the medial left lower kidney (series 3/image 40), benign (Bosniak I). No follow-up is recommended. No hydronephrosis. Stomach/Bowel: Stomach is within normal limits. Visualized bowel is grossly unremarkable. Vascular/Lymphatic:  No evidence of abdominal aortic aneurysm. Cavernous transformation of the portal vein, chronic. Collateralization particularly along the common duct. No suspicious abdominal lymphadenopathy. Other:  No abdominal ascites. Musculoskeletal: No focal osseous lesions. IMPRESSION: Cirrhosis.  No findings suspicious for HCC. Moderate intrahepatic and extrahepatic duct dilatation. Narrowing/stricture of the mid/distal common duct, chronic. No choledocholithiasis is seen. Cavernous transformation of the portal vein, chronic. Splenomegaly. Sequela of prior/chronic pancreatitis, as above. Electronically Signed   By: Pinkie Pebbles M.D.   On: 04/07/2023 20:39   MR 3D Recon At Scanner Result Date: 04/07/2023 CLINICAL DATA:  Cirrhosis, elevated LFTs, jaundice EXAM: MRI ABDOMEN WITHOUT AND WITH CONTRAST (INCLUDING MRCP) TECHNIQUE: Multiplanar multisequence MR imaging of the abdomen was performed both before and after the administration of intravenous contrast. Heavily T2-weighted images of the biliary and pancreatic ducts were obtained, and three-dimensional MRCP images were rendered by post processing. CONTRAST:  6mL GADAVIST  GADOBUTROL  1 MMOL/ML  IV SOLN COMPARISON:  CT abdomen/pelvis dated 04/06/2023 FINDINGS: Lower chest: Lung bases are essentially clear. Hepatobiliary: Suspected cirrhosis. No suspicious/enhancing hepatic lesions. Moderate intrahepatic and extrahepatic duct dilatation. Narrowing/stricture of the mid/distal common duct (series 2/image 18). No choledocholithiasis is seen. Following contrast administration, there is  peribiliary enhancement in the liver and along the common duct, chronic when compared to prior CTs, and favored to be related to portal vein collateralization. No restricted diffusion. Pancreas: Parenchymal atrophy with prominent pancreatic duct with bile duct ectasia. This appearance suggests sequela of prior/chronic pancreatitis, better evaluated on CT. Spleen: Splenomegaly, measuring 17.0 cm in maximal craniocaudal dimension. Adrenals/Urinary Tract:  Adrenal glands are within normal limits. Bilateral renal cysts, measuring up to 2.5 cm in the medial left lower kidney (series 3/image 40), benign (Bosniak I). No follow-up is recommended. No hydronephrosis. Stomach/Bowel: Stomach is within normal limits. Visualized bowel is grossly unremarkable. Vascular/Lymphatic:  No evidence of abdominal aortic aneurysm. Cavernous transformation of the portal vein, chronic. Collateralization particularly along the common duct. No suspicious abdominal lymphadenopathy. Other:  No abdominal ascites. Musculoskeletal: No focal osseous lesions. IMPRESSION: Cirrhosis.  No findings suspicious for HCC. Moderate intrahepatic and extrahepatic duct dilatation. Narrowing/stricture of the mid/distal common duct, chronic. No choledocholithiasis is seen. Cavernous transformation of the portal vein, chronic. Splenomegaly. Sequela of prior/chronic pancreatitis, as above. Electronically Signed   By: Pinkie Pebbles M.D.   On: 04/07/2023 20:39   DG Chest 1 View Result Date: 04/07/2023 CLINICAL DATA:  Cirrhosis, elevated LFTs EXAM: CHEST  1 VIEW COMPARISON:  05/10/2022 FINDINGS: Lungs are clear.  No pleural effusion or pneumothorax. The heart is normal in size. IMPRESSION: No acute cardiopulmonary disease. Electronically Signed   By: Pinkie Pebbles M.D.   On: 04/07/2023 20:27   CT ABDOMEN PELVIS W CONTRAST Result Date: 04/06/2023 CLINICAL DATA:  Cholelithiasis concern for biliary obstruction Patient reports abdominal pain. History of  pancreatitis and this feels similar. EXAM: CT ABDOMEN AND PELVIS WITH CONTRAST TECHNIQUE: Multidetector CT imaging of the abdomen and pelvis was performed using the standard protocol following bolus administration of intravenous contrast. RADIATION DOSE REDUCTION: This exam was performed according to the departmental dose-optimization program which includes automated exposure control, adjustment of the mA and/or kV according to patient size and/or use of iterative reconstruction technique. CONTRAST:  OMNIPAQUE  IOHEXOL  300 MG/ML  SOLN COMPARISON:  CT 01/14/2023 FINDINGS: Lower chest: No focal airspace disease or pleural effusion. Hepatobiliary: Previous hepatic drain has been removed. Previous hypodensity in the posterior right lobe of the liver has resolved. No new hepatic abnormality. There is chronic intrahepatic biliary dilatation, unchanged from prior exam. The gallbladder is physiologically distended. Mid and lower common bile duct stricture was previously characterized on MRI. No visible calcified gallstone or choledocholithiasis. Pancreas: Sequela of chronic pancreatitis with pancreatic calcifications and ductal dilatation. Probable chronic stent in the pancreatic head, unchanged. There is no definite peripancreatic fat stranding or inflammation. Spleen: Chronic splenomegaly, 17.3 cm AP. Tiny hypodensity anteriorly is unchanged. No new splenic abnormality. Adrenals/Urinary Tract: Normal adrenal glands. No hydronephrosis or renal inflammation. No renal calculi. Bilateral renal cysts. No further follow-up imaging is recommended. Unremarkable urinary bladder. Stomach/Bowel: Detailed bowel assessment is limited in the absence of enteric contrast. Distal esophageal varices. The stomach is nondistended. No small bowel obstruction or inflammation. Fecalization of distal small bowel contents. Moderate volume of stool throughout the colon. The appendix is not definitively seen. 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 The splenic vein is patent. Aortic atherosclerosis without aneurysm. No enlarged lymph nodes in the abdomen or pelvis. Reproductive: Prostate is unremarkable. Other: Chronic edema in the small bowel mesentery. No ascites or free air. Musculoskeletal: There are no acute or suspicious osseous abnormalities. IMPRESSION: 1. Interval removal of hepatic drain. Chronic intrahepatic biliary dilatation, unchanged from prior exam. Mid and lower common bile duct stricture was previously characterized on MRI. No visible calcified gallstone or choledocholithiasis. 2. Sequela of chronic pancreatitis with pancreatic calcifications and ductal dilatation. No definite peripancreatic fat stranding or inflammation. 3. 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 4. Chronic splenomegaly. Distal esophageal varices. 5. Moderate colonic stool burden with Fecalization of distal small bowel contents, can be seen with slow transit. Aortic Atherosclerosis (ICD10-I70.0). Electronically Signed   By: Andrea Gasman M.D.   On: 04/06/2023 21:39     Subjective: Patient seen examined at bedside, resting calmly.  Lying in bed.  Abdominal pain resolved.  Tolerating diet.  Seen by GI, LFTs trending down and no choledocholithiasis noted on MRCP.  Okay for discharge home with outpatient follow-up with his his primary GI at [facility].  GI recommended repeat LFTs 1 week.  Patient requesting work note and change of Percocet to oxycodone  given he needs to be off of Tylenol  given his cirrhosis.  No other specific questions, concerns or complaints at this time.  Denies headache, no visual changes, no chest pain, no palpitations, no shortness of breath, no abdominal pain, no fever/chills/night sweats, no nausea/vomiting/diarrhea, no focal  weakness, no fatigue, no paresthesias.  No acute events overnight per nursing staff.  Discharge Exam: Vitals:   04/08/23 0410 04/08/23 0906  BP: 101/72 108/72  Pulse: 66 63  Resp: 18 20  Temp: 98 F (36.7 C) 97.9 F (36.6 C)  SpO2: 98% 97%   Vitals:   04/07/23 1705 04/07/23 2026 04/08/23 0410 04/08/23 0906  BP: 105/76 101/74 101/72 108/72  Pulse: 68 67 66 63  Resp: 18 18 18 20   Temp: 98 F (36.7 C) 97.7 F (36.5 C) 98 F (36.7 C) 97.9 F (36.6 C)  TempSrc: Oral Oral  Oral  SpO2: 99% 97% 98% 97%  Weight:      Height:        Physical Exam: GEN: NAD, alert and oriented x 3, wd/wn HEENT: NCAT, PERRL, EOMI, sclera clear, MMM PULM: CTAB w/o wheezes/crackles, normal respiratory effort, on room air CV: RRR w/o M/G/R GI: abd soft, NTND, NABS, no R/G/M MSK: no peripheral edema, moves all EXTR independently NEURO: CN II-XII intact, no focal deficits, sensation to light touch intact PSYCH: normal mood/affect Integumentary: No concerning rashes/lesions/wounds noted on exposed skin surfaces    The results of significant diagnostics from this hospitalization (including imaging, microbiology, ancillary and laboratory) are listed below for reference.     Microbiology: Recent Results (from the past 240 hours)  Culture, blood (single) w Reflex to ID Panel     Status: None (Preliminary result)   Collection Time: 04/07/23 10:28 AM   Specimen: BLOOD LEFT ARM  Result Value Ref Range Status   Specimen Description BLOOD LEFT ARM  Final   Special Requests   Final    BOTTLES DRAWN AEROBIC AND ANAEROBIC Blood Culture results may not be optimal due to [place] inadequate volume of blood received in culture bottles   Culture   Final    NO GROWTH < 24 HOURS Performed at  [facility], [address], [place], [place] [address]    Report Status PENDING  Incomplete     Labs: BNP (last 3 results) No results for input(s): BNP in the last 8760 hours. Basic Metabolic Panel: Recent Labs   Lab 04/06/23 1821 04/07/23 0925 04/08/23 0338  NA 133* 137 138  K 3.9 3.6 3.7  CL 105 106 106  CO2 19* 21* 23  GLUCOSE 79 70 177*  BUN 16 22* 22*  CREATININE 0.84 1.09 1.08  CALCIUM 8.8* 8.8* 8.8*   Liver Function Tests: Recent Labs  Lab 04/06/23 1821 04/07/23 0925 04/08/23 0338  AST 151* 77* 42*  ALT 165* 125* 91*  ALKPHOS 262* 231* 206*  BILITOT 6.8* 4.6* 2.2*  PROT 7.7 6.8 6.5  ALBUMIN 4.1 3.4* 3.2*   Recent Labs  Lab 04/06/23 1821  LIPASE 21   No results for input(s): AMMONIA in the last 168 hours. CBC: Recent Labs  Lab 04/06/23 1821 04/08/23 0338  WBC 6.7 2.6*  NEUTROABS 5.8  --   HGB 14.5 13.9  HCT 43.7 41.2  MCV 85.2 85.3  PLT 47* 38*   Cardiac Enzymes: No results for input(s): CKTOTAL, CKMB, CKMBINDEX, TROPONINI in the last 168 hours. BNP: Invalid input(s): POCBNP CBG: No results for input(s): GLUCAP in the last 168 hours. D-Dimer No results for input(s): DDIMER in the last 72 hours. Hgb A1c No results for input(s): HGBA1C in the last 72 hours. Lipid Profile No results for input(s): CHOL, HDL, LDLCALC, TRIG, CHOLHDL, LDLDIRECT in the last 72 hours. Thyroid function studies No results for input(s): TSH, T4TOTAL, T3FREE, THYROIDAB in the last 72 hours.  Invalid input(s): FREET3 Anemia work up No results for input(s): VITAMINB12, FOLATE, FERRITIN, TIBC, IRON, RETICCTPCT in the last 72 hours. Urinalysis    Component Value Date/Time   COLORURINE ORANGE (A) 04/06/2023 1821   APPEARANCEUR CLEAR 04/06/2023 1821   LABSPEC 1.025 04/06/2023 1821   PHURINE 5.0 04/06/2023 1821   GLUCOSEU >=500 (A) 04/06/2023 1821   HGBUR NEGATIVE 04/06/2023 1821   BILIRUBINUR LARGE (A) 04/06/2023 1821   KETONESUR NEGATIVE 04/06/2023 1821   PROTEINUR NEGATIVE 04/06/2023 1821   UROBILINOGEN 0.2 02/25/2014 1320   NITRITE NEGATIVE 04/06/2023 1821   LEUKOCYTESUR NEGATIVE 04/06/2023 1821   Sepsis Labs Recent Labs   Lab 04/06/23 1821 04/08/23 0338  WBC 6.7 2.6*   Microbiology Recent Results (from the past 240 hours)  Culture, blood (single) w Reflex to ID Panel     Status: None (Preliminary result)   Collection Time: 04/07/23 10:28 AM   Specimen: BLOOD LEFT ARM  Result Value Ref Range Status   Specimen Description BLOOD LEFT ARM  Final   Special Requests   Final    BOTTLES DRAWN AEROBIC AND ANAEROBIC Blood Culture results may not be optimal due to Lawrence inadequate volume of blood received in culture bottles   Culture   Final    NO GROWTH < 24 HOURS Performed at [facility], [address], [place], [place] [address]    Report Status PENDING  Incomplete     Time coordinating discharge: Over 30 minutes  SIGNED:   Camellia PARAS Bell Cai, DO  [facility] 04/08/2023, 12:49 PM

## 2023-04-09 ENCOUNTER — Ambulatory Visit: Payer: BLUE CROSS/BLUE SHIELD | Admitting: Infectious Diseases

## 2023-04-12 LAB — CULTURE, BLOOD (SINGLE): Culture: NO GROWTH

## 2023-11-21 NOTE — Progress Notes (Signed)
 ercp

## 2023-12-15 ENCOUNTER — Emergency Department (HOSPITAL_COMMUNITY)

## 2023-12-15 ENCOUNTER — Emergency Department (HOSPITAL_COMMUNITY)
Admission: EM | Admit: 2023-12-15 | Discharge: 2023-12-15 | Discharge status: Against Medical Advice | Source: Home / Self Care | Attending: Emergency Medicine | Admitting: Emergency Medicine

## 2023-12-15 ENCOUNTER — Other Ambulatory Visit: Payer: Self-pay

## 2023-12-15 DIAGNOSIS — Z5329 Procedure and treatment not carried out because of patient's decision for other reasons: Secondary | ICD-10-CM | POA: Insufficient documentation

## 2023-12-15 DIAGNOSIS — R6510 Systemic inflammatory response syndrome (SIRS) of non-infectious origin without acute organ dysfunction: Secondary | ICD-10-CM | POA: Insufficient documentation

## 2023-12-15 DIAGNOSIS — R109 Unspecified abdominal pain: Secondary | ICD-10-CM | POA: Insufficient documentation

## 2023-12-15 DIAGNOSIS — A419 Sepsis, unspecified organism: Secondary | ICD-10-CM | POA: Insufficient documentation

## 2023-12-15 DIAGNOSIS — I10 Essential (primary) hypertension: Secondary | ICD-10-CM | POA: Insufficient documentation

## 2023-12-15 DIAGNOSIS — R509 Fever, unspecified: Secondary | ICD-10-CM | POA: Diagnosis not present

## 2023-12-15 DIAGNOSIS — D735 Infarction of spleen: Secondary | ICD-10-CM

## 2023-12-15 DIAGNOSIS — K8309 Other cholangitis: Secondary | ICD-10-CM | POA: Diagnosis not present

## 2023-12-15 LAB — RESP PANEL BY RT-PCR (RSV, FLU A&B, COVID)  RVPGX2
Influenza A by PCR: NEGATIVE
Influenza B by PCR: NEGATIVE
Resp Syncytial Virus by PCR: NEGATIVE
SARS Coronavirus 2 by RT PCR: NEGATIVE

## 2023-12-15 LAB — URINALYSIS, W/ REFLEX TO CULTURE (INFECTION SUSPECTED)
Bilirubin Urine: NEGATIVE
Glucose, UA: >=500 mg/dL — AB
Hgb urine dipstick: NEGATIVE
Ketones, ur: NEGATIVE mg/dL
Leukocytes,Ua: NEGATIVE
Nitrite: NEGATIVE
Protein, ur: NEGATIVE mg/dL
Specific Gravity, Urine: 1.027 (ref 1.005–1.030)
pH: 5 (ref 5.0–8.0)

## 2023-12-15 LAB — COMPREHENSIVE METABOLIC PANEL WITH GFR
ALT: 43 U/L (ref 0–44)
AST: 31 U/L (ref 15–41)
Albumin: 2.5 g/dL — ABNORMAL LOW (ref 3.5–5.0)
Alkaline Phosphatase: 475 U/L — ABNORMAL HIGH (ref 38–126)
Anion gap: 11 (ref 5–15)
BUN: 14 mg/dL (ref 6–20)
CO2: 18 mmol/L — ABNORMAL LOW (ref 22–32)
Calcium: 8.2 mg/dL — ABNORMAL LOW (ref 8.9–10.3)
Chloride: 103 mmol/L (ref 98–111)
Creatinine, Ser: 0.99 mg/dL (ref 0.61–1.24)
GFR, Estimated: >60 mL/min (ref >60)
Glucose, Bld: 99 mg/dL (ref 70–99)
Potassium: 3.9 mmol/L (ref 3.5–5.1)
Sodium: 132 mmol/L — ABNORMAL LOW (ref 135–145)
Total Bilirubin: 2.7 mg/dL — ABNORMAL HIGH (ref 0.0–1.2)
Total Protein: 6.6 g/dL (ref 6.5–8.1)

## 2023-12-15 LAB — I-STAT CHEM 8, ED
BUN: 14 mg/dL (ref 6–20)
Calcium, Ion: 1.08 mmol/L — ABNORMAL LOW (ref 1.15–1.40)
Chloride: 103 mmol/L (ref 98–111)
Creatinine, Ser: 0.9 mg/dL (ref 0.61–1.24)
Glucose, Bld: 103 mg/dL — ABNORMAL HIGH (ref 70–99)
HCT: 31 % — ABNORMAL LOW (ref 39.0–52.0)
Hemoglobin: 10.5 g/dL — ABNORMAL LOW (ref 13.0–17.0)
Potassium: 4 mmol/L (ref 3.5–5.1)
Sodium: 135 mmol/L (ref 135–145)
TCO2: 20 mmol/L — ABNORMAL LOW (ref 22–32)

## 2023-12-15 LAB — CBC WITH DIFFERENTIAL/PLATELET
Abs Immature Granulocytes: 0.03 K/uL (ref 0.00–0.07)
Basophils Absolute: 0 K/uL (ref 0.0–0.1)
Basophils Relative: 0 %
Eosinophils Absolute: 0 K/uL (ref 0.0–0.5)
Eosinophils Relative: 1 %
HCT: 32.2 % — ABNORMAL LOW (ref 39.0–52.0)
Hemoglobin: 10.4 g/dL — ABNORMAL LOW (ref 13.0–17.0)
Immature Granulocytes: 1 %
Lymphocytes Relative: 5 %
Lymphs Abs: 0.3 K/uL — ABNORMAL LOW (ref 0.7–4.0)
MCH: 28.7 pg (ref 26.0–34.0)
MCHC: 32.3 g/dL (ref 30.0–36.0)
MCV: 89 fL (ref 80.0–100.0)
Monocytes Absolute: 0.3 K/uL (ref 0.1–1.0)
Monocytes Relative: 5 %
Neutro Abs: 5.4 K/uL (ref 1.7–7.7)
Neutrophils Relative %: 88 %
Platelets: 38 K/uL — ABNORMAL LOW (ref 150–400)
RBC: 3.62 MIL/uL — ABNORMAL LOW (ref 4.22–5.81)
RDW: 14.7 % (ref 11.5–15.5)
WBC: 6 K/uL (ref 4.0–10.5)
nRBC: 0 % (ref 0.0–0.2)

## 2023-12-15 LAB — PROTIME-INR
INR: 1.1 (ref 0.8–1.2)
Prothrombin Time: 14.8 s (ref 11.4–15.2)

## 2023-12-15 LAB — LIPASE, BLOOD: Lipase: <10 U/L — ABNORMAL LOW (ref 11–51)

## 2023-12-15 LAB — I-STAT CG4 LACTIC ACID, ED: Lactic Acid, Venous: 1.6 mmol/L (ref 0.5–1.9)

## 2023-12-15 MED ORDER — METRONIDAZOLE 500 MG/100ML IV SOLN
500.0000 mg | Freq: Once | INTRAVENOUS | Status: AC
  Administered 2023-12-15: 500 mg via INTRAVENOUS
  Filled 2023-12-15: qty 100

## 2023-12-15 MED ORDER — LACTATED RINGERS IV BOLUS (SEPSIS)
1000.0000 mL | Freq: Once | INTRAVENOUS | Status: AC
  Administered 2023-12-15: 1000 mL via INTRAVENOUS

## 2023-12-15 MED ORDER — LACTATED RINGERS IV SOLN: INTRAVENOUS | Status: DC

## 2023-12-15 MED ORDER — LACTATED RINGERS IV BOLUS (SEPSIS): 500.0000 mL | Freq: Once | INTRAVENOUS | Status: DC

## 2023-12-15 MED ORDER — VANCOMYCIN HCL IN DEXTROSE 1-5 GM/200ML-% IV SOLN
1000.0000 mg | Freq: Once | INTRAVENOUS | Status: AC
  Administered 2023-12-15: 1000 mg via INTRAVENOUS
  Filled 2023-12-15: qty 200

## 2023-12-15 MED ORDER — IOHEXOL 350 MG/ML SOLN
75.0000 mL | Freq: Once | INTRAVENOUS | Status: AC | PRN
  Administered 2023-12-15: 75 mL via INTRAVENOUS

## 2023-12-15 MED ORDER — LACTATED RINGERS IV BOLUS (SEPSIS): 250.0000 mL | Freq: Once | INTRAVENOUS | Status: DC

## 2023-12-15 MED ORDER — ACETAMINOPHEN 500 MG PO TABS: 1000.0000 mg | ORAL_TABLET | Freq: Once | ORAL | Status: DC

## 2023-12-15 MED ORDER — SODIUM CHLORIDE 0.9 % IV SOLN
2.0000 g | Freq: Once | INTRAVENOUS | Status: AC
  Administered 2023-12-15: 2 g via INTRAVENOUS
  Filled 2023-12-15: qty 12.5

## 2023-12-15 NOTE — ED Triage Notes (Signed)
 Pt bib [facility] from work. Pt reports having fever and nausea for last 3 days. Pt has hx of kidney issues and sepsis. LR given, 4MG  zofran .    EMS VS  148/90 BP 104F Temp (temporal) 106 HR  18 RR 96% RA 102 CBG   HX cirrohis, diabetes

## 2023-12-15 NOTE — Sepsis Progress Note (Signed)
 Sepsis protocol is being followed by [facility].

## 2023-12-15 NOTE — Sepsis Progress Note (Signed)
 Notified bedside nurse of need to order blood cultures per sepsis protocol.

## 2023-12-15 NOTE — ED Provider Notes (Cosign Needed)
 [facility] Provider Note   CSN: [id number] Arrival date & time: 12/15/23  1157     Patient presents with: possible sepsis   Lawrence Brock is a 53 y.o. male.   53 year old male presents today for concern of fever and chills since Friday.  Endorses some nausea but no emesis.  Has history of cirrhosis and hepatic abscesses.  He denies any chest pain.  Denies any other new symptoms.  No dysuria.  States he is having normal brown-colored stools and no diarrhea.  Denies any confusion.  Reports compliance with all of his home medicines.  He recently had an ERCP performed at the end of August.  He has had multiple EGDs performed in the past several months with multiple banding of his esophageal varices.  He currently denies abdominal pain but states that he chronically has abdominal pain that rates at a 3/4 out of 10.  The history is provided by the patient. No language interpreter was used.       Prior to Admission medications   Medication Sig Start Date End Date Taking? Authorizing Provider  acetaminophen  (TYLENOL ) 500 MG tablet Take 500-1,000 mg by mouth every 6 (six) hours as needed for moderate pain (pain score 4-6).   Yes [provider]  CREON  36000-114000 units CPEP capsule Take 1-2 capsules by mouth See admin instructions. Take 2 capsules by mouth three times a day with meals and 1 capsule with snacks. 11/27/23  Yes [provider]  FLUoxetine  (PROZAC ) 20 MG capsule Take 20 mg by mouth daily. 12/07/22  Yes [provider]  JARDIANCE  25 MG TABS tablet Take 25 mg by mouth daily. 03/16/23  Yes [provider]  Multiple Vitamin (MULTIVITAMIN PO) Take 1 tablet by mouth daily.   Yes [provider]  OVER THE COUNTER MEDICATION Take 1 tablet by mouth daily. Blood Builder Mega Food   Yes [provider]  oxyCODONE  (OXY IR/ROXICODONE ) 5 MG immediate release tablet Take 1 tablet (5 mg total) by mouth  every 6 (six) hours as needed for moderate pain (pain score 4-6). 04/08/23  Yes [place], Eric J, DO  pantoprazole  (PROTONIX ) 40 MG tablet Take 40 mg by mouth daily.   Yes [provider]  SYMBICORT 160-4.5 MCG/ACT inhaler Inhale 2 puffs into the lungs 2 (two) times daily. 11/11/22  Yes [provider]  TOUJEO MAX SOLOSTAR 300 UNIT/ML Solostar Pen Inject 14 Units into the skin at bedtime. 12/04/23  Yes [provider]    Allergies: Patient has no known allergies.    Review of Systems  Constitutional:  Positive for chills and fever.  Respiratory:  Negative for shortness of breath.   Cardiovascular:  Negative for chest pain.  Gastrointestinal:  Negative for abdominal pain and nausea.  All other systems reviewed and are negative.   Updated Vital Signs BP 100/66   Pulse 87   Temp (!) 103 F (39.4 C) (Oral)   Resp 13   Ht 5' 8.5 (1.74 m)   Wt 55.8 kg   SpO2 92%   BMI 18.43 kg/m   Physical Exam Vitals and nursing note reviewed.  Constitutional:      General: He is not in acute distress.    Appearance: Normal appearance. He is not ill-appearing.  HENT:     Head: Normocephalic and atraumatic.     Nose: Nose normal.  Eyes:     Conjunctiva/sclera: Conjunctivae normal.  Cardiovascular:     Rate and Rhythm:  Normal rate and regular rhythm.  Pulmonary:     Effort: Pulmonary effort is normal. No respiratory distress.  Abdominal:     General: Abdomen is flat. There is no distension.     Palpations: Abdomen is soft.     Tenderness: There is no abdominal tenderness. There is no guarding.  Musculoskeletal:        General: No deformity. Normal range of motion.     Cervical back: Normal range of motion.  Skin:    Findings: No rash.  Neurological:     Mental Status: He is alert.     (all labs ordered are listed, but only abnormal results are displayed) Labs Reviewed  COMPREHENSIVE METABOLIC PANEL WITH GFR - Abnormal; Notable for the following components:       Result Value   Sodium 132 (*)    CO2 18 (*)    Calcium 8.2 (*)    Albumin 2.5 (*)    Alkaline Phosphatase 475 (*)    Total Bilirubin 2.7 (*)    All other components within normal limits  CBC WITH DIFFERENTIAL/PLATELET - Abnormal; Notable for the following components:   RBC 3.62 (*)    Hemoglobin 10.4 (*)    HCT 32.2 (*)    Platelets 38 (*)    Lymphs Abs 0.3 (*)    All other components within normal limits  URINALYSIS, W/ REFLEX TO CULTURE (INFECTION SUSPECTED) - Abnormal; Notable for the following components:   Glucose, UA >=500 (*)    Bacteria, UA RARE (*)    All other components within normal limits  I-STAT CHEM 8, ED - Abnormal; Notable for the following components:   Glucose, Bld 103 (*)    Calcium, Ion 1.08 (*)    TCO2 20 (*)    Hemoglobin 10.5 (*)    HCT 31.0 (*)    All other components within normal limits  RESP PANEL BY RT-PCR (RSV, FLU A&B, COVID)  RVPGX2  CULTURE, BLOOD (ROUTINE X 2)  CULTURE, BLOOD (ROUTINE X 2)  PROTIME-INR  LIPASE, BLOOD  I-STAT CG4 LACTIC ACID, ED  I-STAT CG4 LACTIC ACID, ED  I-STAT CG4 LACTIC ACID, ED  I-STAT CG4 LACTIC ACID, ED    EKG: None  Radiology: DG Chest Port 1 View Result Date: 12/15/2023 EXAM: 1 VIEW XRAY OF THE CHEST 12/15/2023 01:02:00 PM COMPARISON: 06/16/2023 CLINICAL HISTORY: Questionable sepsis - evaluate for abnormality. Questionable sepsis; hx of pancreatitis, hypertension. FINDINGS: LINES, TUBES AND DEVICES: EKG leads noted. LUNGS AND PLEURA: No focal pulmonary opacity. No pulmonary edema. No pleural effusion. No pneumothorax. Skin fold over the upper right hemithorax. HEART AND MEDIASTINUM: No acute abnormality of the cardiac and mediastinal silhouettes. Mild left hemidiaphragm elevation. BONES AND SOFT TISSUES: No acute osseous abnormality. IMPRESSION: 1. No acute cardiopulmonary abnormality. Electronically signed by: Rockey Kilts MD 12/15/2023 01:36 PM EDT RP Workstation: [id number]     .Critical Care  Performed  by: Hildegard Loge, PA-C Authorized by: Hildegard Loge, PA-C   Critical care provider statement:    Critical care time (minutes):  30   Critical care was necessary to treat or prevent imminent or life-threatening deterioration of the following conditions:  Sepsis   Critical care was time spent personally by me on the following activities:  Development of treatment plan with patient or surrogate, discussions with consultants, evaluation of patient's response to treatment, examination of patient, ordering and review of laboratory studies, ordering and review of radiographic studies, ordering and performing treatments and interventions, pulse oximetry, re-evaluation of patient's  condition and review of old charts    Medications Ordered in the ED  lactated ringers  infusion ( Intravenous New Bag/Given 12/15/23 1314)  acetaminophen  (TYLENOL ) tablet 1,000 mg (has no administration in time range)  lactated ringers  bolus 1,000 mL (1,000 mLs Intravenous New Bag/Given 12/15/23 1315)  ceFEPIme  (MAXIPIME ) 2 g in sodium chloride  0.9 % 100 mL IVPB (0 g Intravenous Stopped 12/15/23 1408)  metroNIDAZOLE  (FLAGYL ) IVPB 500 mg (0 mg Intravenous Stopped 12/15/23 1413)  vancomycin  (VANCOCIN ) IVPB 1000 mg/200 mL premix (0 mg Intravenous Stopped 12/15/23 1413)                                    Medical Decision Making Amount and/or Complexity of Data Reviewed Labs: ordered. Radiology: ordered.  Risk OTC drugs. Prescription drug management.   Medical Decision Making / ED Course   This patient presents to the ED for concern of fever and chills, this involves an extensive number of treatment options, and is a complaint that carries with it a high risk of complications and morbidity.  The differential diagnosis includes sepsis, viral URI, pneumonia, UTI, pyelonephritis, hepatic abscess  MDM: 53 year old male presents with concern of fever.  SIRS positive on arrival. Sepsis protocol initiated. CT abdomen pelvis ordered  to evaluate for hepatic abscess given his history. Large-volume fluid resuscitation ordered.  He did receive 800 mL and route.  Will remove the additional half liter bolus and our protocol because of this. Broad-spectrum antibiotics ordered.  CBC without leukocytosis.  CMP without acute concerns.  Preserved renal function.  Lactic acid is normal.  Respiratory panel is negative.  Chest x-ray without acute cardiopulmonary process.  EKG without acute ischemic change.   Lab Tests: -I ordered, reviewed, and interpreted labs.   The pertinent results include:   Labs Reviewed  COMPREHENSIVE METABOLIC PANEL WITH GFR - Abnormal; Notable for the following components:      Result Value   Sodium 132 (*)    CO2 18 (*)    Calcium 8.2 (*)    Albumin 2.5 (*)    Alkaline Phosphatase 475 (*)    Total Bilirubin 2.7 (*)    All other components within normal limits  CBC WITH DIFFERENTIAL/PLATELET - Abnormal; Notable for the following components:   RBC 3.62 (*)    Hemoglobin 10.4 (*)    HCT 32.2 (*)    Platelets 38 (*)    Lymphs Abs 0.3 (*)    All other components within normal limits  URINALYSIS, W/ REFLEX TO CULTURE (INFECTION SUSPECTED) - Abnormal; Notable for the following components:   Glucose, UA >=500 (*)    Bacteria, UA RARE (*)    All other components within normal limits  I-STAT CHEM 8, ED - Abnormal; Notable for the following components:   Glucose, Bld 103 (*)    Calcium, Ion 1.08 (*)    TCO2 20 (*)    Hemoglobin 10.5 (*)    HCT 31.0 (*)    All other components within normal limits  RESP PANEL BY RT-PCR (RSV, FLU A&B, COVID)  RVPGX2  CULTURE, BLOOD (ROUTINE X 2)  CULTURE, BLOOD (ROUTINE X 2)  PROTIME-INR  LIPASE, BLOOD  I-STAT CG4 LACTIC ACID, ED  I-STAT CG4 LACTIC ACID, ED  I-STAT CG4 LACTIC ACID, ED  I-STAT CG4 LACTIC ACID, ED      EKG  EKG Interpretation Date/Time:    Ventricular Rate:    PR Interval:  QRS Duration:    QT Interval:    QTC Calculation:   R Axis:       Text Interpretation:           Imaging Studies ordered: I ordered imaging studies including chest x-ray I independently visualized and interpreted imaging. I agree with the radiologist interpretation   Medicines ordered and prescription drug management: Meds ordered this encounter  Medications   lactated ringers  infusion   lactated ringers  bolus 1,000 mL    Total Body Weight basis for 30 mL/kg  bolus delivery:   55.8 kg   DISCONTD: lactated ringers  bolus 500 mL    Total Body Weight basis for 30 mL/kg  bolus delivery:   55.8 kg   DISCONTD: lactated ringers  bolus 250 mL    Total Body Weight basis for 30 mL/kg  bolus delivery:   55.8 kg   ceFEPIme  (MAXIPIME ) 2 g in sodium chloride  0.9 % 100 mL IVPB    Antibiotic Indication::   Other Indication (list below)    Other Indication::   Unknown Source.   metroNIDAZOLE  (FLAGYL ) IVPB 500 mg    Antibiotic Indication::   Other Indication (list below)    Other Indication::   Unknown Source.   vancomycin  (VANCOCIN ) IVPB 1000 mg/200 mL premix    Indication::   Other Indication (list below)    Other Indication::   Unknown Source.   acetaminophen  (TYLENOL ) tablet 1,000 mg    -I have reviewed the patients home medicines and have made adjustments as needed  Critical interventions Large-volume fluid resuscitation, broad-spectrum antibiotics  Cardiac Monitoring: The patient was maintained on a cardiac monitor.  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 Follwed by Dr Toribio Cedar and Nelwyn Rather (at [facility]).     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    Hypertension    No meds yet.   Liver abscess 01/2014   Pneumonia    10-30-14 [facility] hospital admission, no problems now.   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    Thrombocytopenia 2015   with splenomegaly 03/2014.  s/p bone marrow biopsy, nonspecific findings 09/2015      Dispostion: Signed out to oncoming team at shift change  Final diagnoses:  SIRS (systemic inflammatory response syndrome) [facility])    ED Discharge Orders     None          Hildegard Loge, PA-C 12/15/23 1454

## 2023-12-15 NOTE — ED Notes (Signed)
 Pt leaving AMA per Beola PA.

## 2023-12-15 NOTE — Discharge Instructions (Signed)
 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  You are leaving AGAINST MEDICAL ADVICE.  Recommend you return immediately to the emergency room for any persistent or worsening symptoms.  As well as following up soon as possible with a medical provider.    Because you are leaving AGAINST MEDICAL ADVICE, you are not notably at risk for worsening illness, worsening sickness, possible morbidity and risk of dying.

## 2023-12-15 NOTE — ED Provider Notes (Addendum)
  Physical Exam  BP 109/81   Pulse 64   Temp 98 F (36.7 C) (Oral)   Resp (!) 9   Ht 5' 8.5 (1.74 m)   Wt 55.8 kg   SpO2 97%   BMI 18.43 kg/m   Physical Exam Vitals and nursing note reviewed.  Constitutional:      General: He is not in acute distress.    Appearance: Normal appearance. He is not ill-appearing.  Eyes:     General:        Right eye: No discharge.        Left eye: No discharge.     Extraocular Movements: Extraocular movements intact.     Conjunctiva/sclera: Conjunctivae normal.  Cardiovascular:     Rate and Rhythm: Normal rate and regular rhythm.  Pulmonary:     Effort: Pulmonary effort is normal. No respiratory distress.  Abdominal:     Tenderness: There is no abdominal tenderness. There is no guarding.  Musculoskeletal:     Cervical back: Normal range of motion. No rigidity.  Neurological:     General: No focal deficit present.     Mental Status: He is alert and oriented to person, place, and time. Mental status is at baseline.     Procedures  Procedures  ED Course / MDM   Clinical Course as of 12/15/23 1941  Mon Dec 15, 2023  1510 Hx of Cirrhosis, esophageal banding multiple times. Febrile with mild generalized abdominal pain since Friday with Hx of hepatic abscesses. Started on broad spectrum abx. SIRS positive.  [CB]  1741 DG Chest Port 1 View [CB]    Clinical Course User Index [CB] Beola Terrall RAMAN, [place]   Medical Decision Making Amount and/or Complexity of Data Reviewed Labs: ordered. Radiology: ordered. Decision-making details documented in ED Course.  Risk OTC drugs. Prescription drug management.   Patient care was transferred from previous provider, please see their note for further information.  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  With him being SIRS positive, febrile and tachycardic upon arrival believe he would warrant admission with further workup and evaluation for the  splenic infarcts.  However patient states that he has a court date tomorrow where he is representing himself.  Because of this and him feeling better after fluids and antibiotics, he is wishing to be discharged AMA.  Spoke at length with both daughter and patient about him leaving AGAINST MEDICAL ADVICE and his risk for morbidity, mortality and possible worsening illness.  He expressed again that he wished to leave AMA despite having been spoken to explicitly and extensively about the risks.  Believe he is of sound mind to make this decision although it is not medically advisable.   Provided strict return to ER precautions. Patient expressed agreement and understanding of plan. All questions were answered.       Beola Terrall RAMAN, [place] 12/15/23 1941    Beola Terrall [place], PA-C 12/15/23 1941    Pamella Ozell LABOR, DO 12/21/23 [phone]

## 2023-12-16 ENCOUNTER — Emergency Department (HOSPITAL_COMMUNITY)

## 2023-12-16 ENCOUNTER — Other Ambulatory Visit: Payer: Self-pay

## 2023-12-16 ENCOUNTER — Inpatient Hospital Stay (HOSPITAL_COMMUNITY)
Admission: EM | Admit: 2023-12-16 | Discharge: 2023-12-18 | DRG: 444 | Disposition: A | Discharge status: Home / Self Care | Attending: Internal Medicine | Admitting: Internal Medicine

## 2023-12-16 ENCOUNTER — Encounter (HOSPITAL_COMMUNITY): Payer: Self-pay | Admitting: Emergency Medicine

## 2023-12-16 DIAGNOSIS — M549 Dorsalgia, unspecified: Secondary | ICD-10-CM | POA: Diagnosis present

## 2023-12-16 DIAGNOSIS — D696 Thrombocytopenia, unspecified: Secondary | ICD-10-CM | POA: Diagnosis present

## 2023-12-16 DIAGNOSIS — R509 Fever, unspecified: Secondary | ICD-10-CM | POA: Diagnosis present

## 2023-12-16 DIAGNOSIS — K7030 Alcoholic cirrhosis of liver without ascites: Secondary | ICD-10-CM | POA: Diagnosis not present

## 2023-12-16 DIAGNOSIS — I10 Essential (primary) hypertension: Secondary | ICD-10-CM | POA: Diagnosis present

## 2023-12-16 DIAGNOSIS — K7460 Unspecified cirrhosis of liver: Secondary | ICD-10-CM | POA: Diagnosis present

## 2023-12-16 DIAGNOSIS — K860 Alcohol-induced chronic pancreatitis: Secondary | ICD-10-CM | POA: Diagnosis present

## 2023-12-16 DIAGNOSIS — I81 Portal vein thrombosis: Secondary | ICD-10-CM | POA: Diagnosis present

## 2023-12-16 DIAGNOSIS — I38 Endocarditis, valve unspecified: Secondary | ICD-10-CM | POA: Diagnosis not present

## 2023-12-16 DIAGNOSIS — Z681 Body mass index (BMI) 19 or less, adult: Secondary | ICD-10-CM

## 2023-12-16 DIAGNOSIS — I8510 Secondary esophageal varices without bleeding: Secondary | ICD-10-CM | POA: Diagnosis present

## 2023-12-16 DIAGNOSIS — Z8249 Family history of ischemic heart disease and other diseases of the circulatory system: Secondary | ICD-10-CM

## 2023-12-16 DIAGNOSIS — F1010 Alcohol abuse, uncomplicated: Secondary | ICD-10-CM | POA: Diagnosis present

## 2023-12-16 DIAGNOSIS — R748 Abnormal levels of other serum enzymes: Secondary | ICD-10-CM | POA: Diagnosis not present

## 2023-12-16 DIAGNOSIS — Z79899 Other long term (current) drug therapy: Secondary | ICD-10-CM

## 2023-12-16 DIAGNOSIS — Z9889 Other specified postprocedural states: Secondary | ICD-10-CM

## 2023-12-16 DIAGNOSIS — E1165 Type 2 diabetes mellitus with hyperglycemia: Secondary | ICD-10-CM | POA: Diagnosis present

## 2023-12-16 DIAGNOSIS — Z7951 Long term (current) use of inhaled steroids: Secondary | ICD-10-CM

## 2023-12-16 DIAGNOSIS — F17210 Nicotine dependence, cigarettes, uncomplicated: Secondary | ICD-10-CM | POA: Diagnosis present

## 2023-12-16 DIAGNOSIS — Z1152 Encounter for screening for COVID-19: Secondary | ICD-10-CM | POA: Diagnosis not present

## 2023-12-16 DIAGNOSIS — Z7984 Long term (current) use of oral hypoglycemic drugs: Secondary | ICD-10-CM | POA: Diagnosis not present

## 2023-12-16 DIAGNOSIS — R636 Underweight: Secondary | ICD-10-CM | POA: Diagnosis present

## 2023-12-16 DIAGNOSIS — J449 Chronic obstructive pulmonary disease, unspecified: Secondary | ICD-10-CM | POA: Diagnosis present

## 2023-12-16 DIAGNOSIS — K8309 Other cholangitis: Principal | ICD-10-CM | POA: Diagnosis present

## 2023-12-16 DIAGNOSIS — D735 Infarction of spleen: Secondary | ICD-10-CM | POA: Diagnosis present

## 2023-12-16 DIAGNOSIS — K766 Portal hypertension: Secondary | ICD-10-CM | POA: Diagnosis present

## 2023-12-16 DIAGNOSIS — K760 Fatty (change of) liver, not elsewhere classified: Secondary | ICD-10-CM | POA: Diagnosis present

## 2023-12-16 DIAGNOSIS — G8929 Other chronic pain: Secondary | ICD-10-CM | POA: Diagnosis present

## 2023-12-16 DIAGNOSIS — K8037 Calculus of bile duct with acute and chronic cholangitis with obstruction: Secondary | ICD-10-CM | POA: Diagnosis not present

## 2023-12-16 LAB — LIPASE, BLOOD: Lipase: <10 U/L — ABNORMAL LOW (ref 11–51)

## 2023-12-16 LAB — COMPREHENSIVE METABOLIC PANEL WITH GFR
ALT: 36 U/L (ref 0–44)
AST: 26 U/L (ref 15–41)
Albumin: 2.4 g/dL — ABNORMAL LOW (ref 3.5–5.0)
Alkaline Phosphatase: 488 U/L — ABNORMAL HIGH (ref 38–126)
Anion gap: 10 (ref 5–15)
BUN: 12 mg/dL (ref 6–20)
CO2: 21 mmol/L — ABNORMAL LOW (ref 22–32)
Calcium: 8.3 mg/dL — ABNORMAL LOW (ref 8.9–10.3)
Chloride: 100 mmol/L (ref 98–111)
Creatinine, Ser: 1.05 mg/dL (ref 0.61–1.24)
GFR, Estimated: >60 mL/min (ref >60)
Glucose, Bld: 309 mg/dL — ABNORMAL HIGH (ref 70–99)
Potassium: 3.8 mmol/L (ref 3.5–5.1)
Sodium: 131 mmol/L — ABNORMAL LOW (ref 135–145)
Total Bilirubin: 1.7 mg/dL — ABNORMAL HIGH (ref 0.0–1.2)
Total Protein: 6.8 g/dL (ref 6.5–8.1)

## 2023-12-16 LAB — URINALYSIS, ROUTINE W REFLEX MICROSCOPIC
Bacteria, UA: NONE SEEN
Bilirubin Urine: NEGATIVE
Glucose, UA: >=500 mg/dL — AB
Hgb urine dipstick: NEGATIVE
Ketones, ur: NEGATIVE mg/dL
Leukocytes,Ua: NEGATIVE
Nitrite: NEGATIVE
Protein, ur: NEGATIVE mg/dL
Specific Gravity, Urine: 1.031 — ABNORMAL HIGH (ref 1.005–1.030)
pH: 5 (ref 5.0–8.0)

## 2023-12-16 LAB — I-STAT CG4 LACTIC ACID, ED
Lactic Acid, Venous: 2.2 mmol/L (ref 0.5–1.9)
Lactic Acid, Venous: 1.2 mmol/L (ref 0.5–1.9)

## 2023-12-16 LAB — MAGNESIUM: Magnesium: 1.8 mg/dL (ref 1.7–2.4)

## 2023-12-16 LAB — CBC WITH DIFFERENTIAL/PLATELET
Abs Immature Granulocytes: 0.03 K/uL (ref 0.00–0.07)
Basophils Absolute: 0 K/uL (ref 0.0–0.1)
Basophils Relative: 0 %
Eosinophils Absolute: 0 K/uL (ref 0.0–0.5)
Eosinophils Relative: 1 %
HCT: 34.9 % — ABNORMAL LOW (ref 39.0–52.0)
Hemoglobin: 11 g/dL — ABNORMAL LOW (ref 13.0–17.0)
Immature Granulocytes: 1 %
Lymphocytes Relative: 8 %
Lymphs Abs: 0.4 K/uL — ABNORMAL LOW (ref 0.7–4.0)
MCH: 28.4 pg (ref 26.0–34.0)
MCHC: 31.5 g/dL (ref 30.0–36.0)
MCV: 90.2 fL (ref 80.0–100.0)
Monocytes Absolute: 0.6 K/uL (ref 0.1–1.0)
Monocytes Relative: 12 %
Neutro Abs: 4.3 K/uL (ref 1.7–7.7)
Neutrophils Relative %: 78 %
Platelets: 40 K/uL — ABNORMAL LOW (ref 150–400)
RBC: 3.87 MIL/uL — ABNORMAL LOW (ref 4.22–5.81)
RDW: 14.6 % (ref 11.5–15.5)
WBC: 5.4 K/uL (ref 4.0–10.5)
nRBC: 0 % (ref 0.0–0.2)

## 2023-12-16 LAB — PROTIME-INR
INR: 1.1 (ref 0.8–1.2)
Prothrombin Time: 14.9 s (ref 11.4–15.2)

## 2023-12-16 LAB — HEMOGLOBIN A1C
Hgb A1c MFr Bld: 7.3 % — ABNORMAL HIGH (ref 4.8–5.6)
Mean Plasma Glucose: 162.81 mg/dL

## 2023-12-16 LAB — GAMMA GT: GGT: 431 U/L — ABNORMAL HIGH (ref 7–50)

## 2023-12-16 LAB — CBG MONITORING, ED: Glucose-Capillary: 173 mg/dL — ABNORMAL HIGH (ref 70–99)

## 2023-12-16 LAB — HIV ANTIBODY (ROUTINE TESTING W REFLEX): HIV Screen 4th Generation wRfx: NONREACTIVE

## 2023-12-16 MED ORDER — VANCOMYCIN HCL IN DEXTROSE 1-5 GM/200ML-% IV SOLN
1000.0000 mg | Freq: Once | INTRAVENOUS | Status: AC
  Administered 2023-12-16: 1000 mg via INTRAVENOUS
  Filled 2023-12-16: qty 200

## 2023-12-16 MED ORDER — FLUTICASONE FUROATE-VILANTEROL 200-25 MCG/ACT IN AEPB
1.0000 | INHALATION_SPRAY | Freq: Every day | RESPIRATORY_TRACT | Status: DC
  Filled 2023-12-16 (×2): qty 28

## 2023-12-16 MED ORDER — HYDROMORPHONE HCL 1 MG/ML IJ SOLN
1.0000 mg | Freq: Four times a day (QID) | INTRAMUSCULAR | Status: AC | PRN
  Administered 2023-12-16 – 2023-12-17 (×2): 1 mg via INTRAVENOUS
  Filled 2023-12-16 (×2): qty 1

## 2023-12-16 MED ORDER — PANCRELIPASE (LIP-PROT-AMYL) 36000-114000 UNITS PO CPEP
36000.0000 [IU] | ORAL_CAPSULE | ORAL | Status: DC
  Administered 2023-12-17: 36000 [IU] via ORAL
  Filled 2023-12-16: qty 1

## 2023-12-16 MED ORDER — METRONIDAZOLE 500 MG/100ML IV SOLN
500.0000 mg | Freq: Two times a day (BID) | INTRAVENOUS | Status: DC
  Administered 2023-12-16 – 2023-12-18 (×4): 500 mg via INTRAVENOUS
  Filled 2023-12-16 (×4): qty 100

## 2023-12-16 MED ORDER — LACTATED RINGERS IV SOLN: INTRAVENOUS | Status: AC

## 2023-12-16 MED ORDER — ACETAMINOPHEN 650 MG RE SUPP: 650.0000 mg | Freq: Four times a day (QID) | RECTAL | Status: DC | PRN

## 2023-12-16 MED ORDER — SODIUM CHLORIDE 0.9 % IV SOLN
2.0000 g | Freq: Once | INTRAVENOUS | Status: AC
  Administered 2023-12-16: 2 g via INTRAVENOUS
  Filled 2023-12-16: qty 12.5

## 2023-12-16 MED ORDER — FLUTICASONE FUROATE-VILANTEROL 200-25 MCG/ACT IN AEPB: 1.0000 | INHALATION_SPRAY | Freq: Every day | RESPIRATORY_TRACT | Status: DC

## 2023-12-16 MED ORDER — ONDANSETRON HCL 4 MG PO TABS: 4.0000 mg | ORAL_TABLET | Freq: Four times a day (QID) | ORAL | Status: DC | PRN

## 2023-12-16 MED ORDER — HYDROMORPHONE HCL 1 MG/ML IJ SOLN
1.0000 mg | Freq: Once | INTRAMUSCULAR | Status: AC
  Administered 2023-12-16: 1 mg via INTRAVENOUS
  Filled 2023-12-16: qty 1

## 2023-12-16 MED ORDER — ONDANSETRON HCL 4 MG/2ML IJ SOLN: 4.0000 mg | Freq: Four times a day (QID) | INTRAMUSCULAR | Status: DC | PRN

## 2023-12-16 MED ORDER — SODIUM CHLORIDE 0.9 % IV BOLUS
1000.0000 mL | Freq: Once | INTRAVENOUS | Status: AC
  Administered 2023-12-16: 1000 mL via INTRAVENOUS

## 2023-12-16 MED ORDER — ACETAMINOPHEN 500 MG PO TABS
500.0000 mg | ORAL_TABLET | Freq: Four times a day (QID) | ORAL | Status: DC | PRN
Start: 2023-12-16 — End: 2023-12-16

## 2023-12-16 MED ORDER — FLUOXETINE HCL 20 MG PO CAPS
20.0000 mg | ORAL_CAPSULE | Freq: Every day | ORAL | Status: DC
  Administered 2023-12-17 – 2023-12-18 (×2): 20 mg via ORAL
  Filled 2023-12-16 (×2): qty 1

## 2023-12-16 MED ORDER — SODIUM CHLORIDE 0.9 % IV SOLN
2.0000 g | Freq: Three times a day (TID) | INTRAVENOUS | Status: DC
  Administered 2023-12-16 – 2023-12-18 (×5): 2 g via INTRAVENOUS
  Filled 2023-12-16 (×5): qty 12.5

## 2023-12-16 MED ORDER — MORPHINE SULFATE (PF) 2 MG/ML IV SOLN: 2.0000 mg | INTRAVENOUS | Status: DC | PRN

## 2023-12-16 MED ORDER — INSULIN ASPART 100 UNIT/ML IJ SOLN
0.0000 [IU] | INTRAMUSCULAR | Status: DC | PRN
  Administered 2023-12-16: 3 [IU] via SUBCUTANEOUS

## 2023-12-16 MED ORDER — METRONIDAZOLE 500 MG/100ML IV SOLN
500.0000 mg | Freq: Once | INTRAVENOUS | Status: AC
  Administered 2023-12-16: 500 mg via INTRAVENOUS
  Filled 2023-12-16: qty 100

## 2023-12-16 MED ORDER — PANTOPRAZOLE SODIUM 40 MG IV SOLR
40.0000 mg | Freq: Two times a day (BID) | INTRAVENOUS | Status: DC
  Administered 2023-12-16 – 2023-12-17 (×3): 40 mg via INTRAVENOUS
  Filled 2023-12-16 (×4): qty 10

## 2023-12-16 MED ORDER — ACETAMINOPHEN 325 MG PO TABS: 650.0000 mg | ORAL_TABLET | Freq: Four times a day (QID) | ORAL | Status: DC | PRN

## 2023-12-16 MED ORDER — VANCOMYCIN HCL 1250 MG/250ML IV SOLN
1250.0000 mg | INTRAVENOUS | Status: DC
  Administered 2023-12-17: 1250 mg via INTRAVENOUS
  Filled 2023-12-16 (×2): qty 250

## 2023-12-16 MED ORDER — PANCRELIPASE (LIP-PROT-AMYL) 36000-114000 UNITS PO CPEP
72000.0000 [IU] | ORAL_CAPSULE | Freq: Three times a day (TID) | ORAL | Status: DC
  Administered 2023-12-16 – 2023-12-18 (×5): 72000 [IU] via ORAL
  Filled 2023-12-16 (×7): qty 2

## 2023-12-16 NOTE — ED Provider Notes (Signed)
 [place] [facility] DE[facility]MENT AT [facility] Provider Note   CSN: [id number] Arrival date & time: 12/16/23  9055     Patient presents with: Fever   Lawrence Brock is a 53 y.o. male liver abscess in the past, cirrhosis, chronic pancreatitis, with current biliary stent, esophageal varices presents to the urgency department again for evaluation.  Patient was seen yesterday with plan for admission however he left against medical vice as he had to appear in court today.  He is coming back for admission and to continue his care.  The patient's been having nausea with subjective fever and chills since Friday.  Not having any chest pain or dysuria.  Is having normal stools without any melena or hematochezia.  No diarrhea.  Denies any dysuria or hematuria.  Reports compliance with all of his medications.  Recently had an ERCP with esophageal banding at the end of August at [facility] with his GI specialist there.  He reports he has some occasional midline abdominal pain that is episodic in nature but none currently.  He also reports some lower paraspinal back pain is chronic for him.  No new changes to his complaint from yesterday.  He reports he did take 2 Advil  and oxycodone  around 0800 this morning.   Fever Associated symptoms: chills and nausea   Associated symptoms: no chest pain, no diarrhea, no dysuria and no vomiting        Prior to Admission medications   Medication Sig Start Date End Date Taking? Authorizing Provider  acetaminophen  (TYLENOL ) 500 MG tablet Take 500-1,000 mg by mouth every 6 (six) hours as needed for moderate pain (pain score 4-6).    [provider]  CREON  36000-114000 units CPEP capsule Take 1-2 capsules by mouth See admin instructions. Take 2 capsules by mouth three times a day with meals and 1 capsule with snacks. 11/27/23   [provider]  FLUoxetine  (PROZAC ) 20 MG capsule Take 20 mg by mouth daily. 12/07/22   [provider]  JARDIANCE   25 MG TABS tablet Take 25 mg by mouth daily. 03/16/23   [provider]  Multiple Vitamin (MULTIVITAMIN PO) Take 1 tablet by mouth daily.    [provider]  OVER THE COUNTER MEDICATION Take 1 tablet by mouth daily. Blood Optometrist, Historical, MD  oxyCODONE  (OXY IR/ROXICODONE ) 5 MG immediate release tablet Take 1 tablet (5 mg total) by mouth every 6 (six) hours as needed for moderate pain (pain score 4-6). 04/08/23   [place], Camellia PARAS, DO  pantoprazole  (PROTONIX ) 40 MG tablet Take 40 mg by mouth daily.    [provider]  SYMBICORT 160-4.5 MCG/ACT inhaler Inhale 2 puffs into the lungs 2 (two) times daily. 11/11/22   [provider]  TOUJEO MAX SOLOSTAR 300 UNIT/ML Solostar Pen Inject 14 Units into the skin at bedtime. 12/04/23   [provider]    Allergies: Patient has no known allergies.    Review of Systems  Constitutional:  Positive for chills and fever.  Respiratory:  Negative for shortness of breath.   Cardiovascular:  Negative for chest pain.  Gastrointestinal:  Positive for abdominal pain and nausea. Negative for blood in stool, constipation, diarrhea and vomiting.  Genitourinary:  Negative for dysuria and hematuria.  Musculoskeletal:  Positive for back pain.    Updated Vital Signs BP 105/77 (BP Location: Right Arm)   Pulse 72   Temp 97.6 F (36.4 C)   Resp 16   Ht  5' 8.5 (1.74 m)   Wt 55.8 kg   SpO2 99%   BMI 18.43 kg/m   Physical Exam Vitals and nursing note reviewed.  Constitutional:      General: He is not in acute distress.    Appearance: He is not ill-appearing or toxic-appearing.  HENT:     Mouth/Throat:     Mouth: Mucous membranes are dry.  Eyes:     General: No scleral icterus. Cardiovascular:     Rate and Rhythm: Normal rate.  Pulmonary:     Effort: Pulmonary effort is normal. No respiratory distress.     Breath sounds: Normal breath sounds.  Abdominal:     Palpations: Abdomen is soft.      Tenderness: There is no abdominal tenderness. There is no guarding or rebound.  Musculoskeletal:     Comments: Some lower lumbar bilateral paraspinal tense palpation but no midline tenderness.  Moving all extremities.  Skin:    General: Skin is warm and dry.  Neurological:     Mental Status: He is alert.     (all labs ordered are listed, but only abnormal results are displayed) Labs Reviewed  CBC WITH DIFFERENTIAL/PLATELET - Abnormal; Notable for the following components:      Result Value   RBC 3.87 (*)    Hemoglobin 11.0 (*)    HCT 34.9 (*)    Platelets 40 (*)    Lymphs Abs 0.4 (*)    All other components within normal limits  COMPREHENSIVE METABOLIC PANEL WITH GFR - Abnormal; Notable for the following components:   Sodium 131 (*)    CO2 21 (*)    Glucose, Bld 309 (*)    Calcium 8.3 (*)    Albumin 2.4 (*)    Alkaline Phosphatase 488 (*)    Total Bilirubin 1.7 (*)    All other components within normal limits  URINALYSIS, ROUTINE W REFLEX MICROSCOPIC - Abnormal; Notable for the following components:   Specific Gravity, Urine 1.031 (*)    Glucose, UA >=500 (*)    All other components within normal limits  I-STAT CG4 LACTIC ACID, ED - Abnormal; Notable for the following components:   Lactic Acid, Venous 2.2 (*)    All other components within normal limits  PROTIME-INR    EKG: None  Radiology: CT ABDOMEN PELVIS W CONTRAST Result Date: 12/15/2023 CLINICAL DATA:  Sepsis.  History of cirrhosis EXAM: CT ABDOMEN AND PELVIS WITH CONTRAST TECHNIQUE: Multidetector CT imaging of the abdomen and pelvis was performed using the standard protocol following bolus administration of intravenous contrast. RADIATION DOSE REDUCTION: This exam was performed according to the departmental dose-optimization program which includes automated exposure control, adjustment of the mA and/or kV according to patient size and/or use of iterative reconstruction technique. CONTRAST:  75mL OMNIPAQUE  IOHEXOL   350 MG/ML SOLN COMPARISON:  Multiple prior imaging studies. The most recent CT scan is 06/17/2023 and the most recent MRI is 09/18/2023 FINDINGS: Lower chest: Streaky subpleural atelectasis. No pleural effusions or pericardial effusion. No pulmonary infiltrates or pulmonary nodules. Hepatobiliary: Metallic common bile duct stent is in place and new since the prior CT scan. Associated not unexpected pneumobilia. No worrisome hepatic lesions. Stable cirrhotic changes. Again noted is cavernous transformation of the portal vein. Stable portal venous hypertension with portal venous collaterals and esophageal varices. Pancreas: Stable changes of chronic calcific pancreatitis with marked pancreatic atrophy. No acute inflammation. Spleen: Stable massive splenomegaly. 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 Adrenals/Urinary Tract: The adrenal glands and  kidneys are unremarkable and stable. Simple bilateral renal cysts. No worrisome renal lesions or hydronephrosis. The bladder is unremarkable. Stomach/Bowel: The stomach, duodenum, small bowel and colon are grossly normal without oral contrast. No inflammatory changes, mass lesions or obstructive findings. Vascular/Lymphatic: Stable atherosclerotic calcifications involving the aorta and iliac arteries. No aneurysm dissection. The major venous structures are patent except for chronic portal vein thrombosis. Reproductive: The prostate gland and seminal vesicles are unremarkable. Other: Mild chronic mesenteric edema. Small amount of free pelvic fluid noted no overt ascites. Musculoskeletal: No significant bony findings. IMPRESSION: 1. Stable cirrhotic changes and portal venous hypertension with portal venous collaterals and esophageal varices. 2. 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 3. Metallic common bile duct stent in place with not unexpected pneumobilia. 4. Stable changes of chronic calcific pancreatitis  with marked pancreatic atrophy. No acute inflammation. Aortic Atherosclerosis (ICD10-I70.0). Electronically Signed   By: MYRTIS Stammer M.D.   On: 12/15/2023 16:31   DG Chest Port 1 View Result Date: 12/15/2023 EXAM: 1 VIEW XRAY OF THE CHEST 12/15/2023 01:02:00 PM COMPARISON: 06/16/2023 CLINICAL HISTORY: Questionable sepsis - evaluate for abnormality. Questionable sepsis; hx of pancreatitis, hypertension. FINDINGS: LINES, TUBES AND DEVICES: EKG leads noted. LUNGS AND PLEURA: No focal pulmonary opacity. No pulmonary edema. No pleural effusion. No pneumothorax. Skin fold over the upper right hemithorax. HEART AND MEDIASTINUM: No acute abnormality of the cardiac and mediastinal silhouettes. Mild left hemidiaphragm elevation. BONES AND SOFT TISSUES: No acute osseous abnormality. IMPRESSION: 1. No acute cardiopulmonary abnormality. Electronically signed by: Rockey Kilts MD 12/15/2023 01:36 PM EDT RP Workstation: [id number]   Procedures   Medications Ordered in the ED  metroNIDAZOLE  (FLAGYL ) IVPB 500 mg (500 mg Intravenous New Bag/Given 12/16/23 1156)  vancomycin  (VANCOCIN ) IVPB 1000 mg/200 mL premix (1,000 mg Intravenous New Bag/Given 12/16/23 1241)  HYDROmorphone  (DILAUDID ) injection 1 mg (1 mg Intravenous Given 12/16/23 1131)  sodium chloride  0.9 % bolus 1,000 mL (1,000 mLs Intravenous New Bag/Given 12/16/23 1149)  ceFEPIme  (MAXIPIME ) 2 g in sodium chloride  0.9 % 100 mL IVPB (0 g Intravenous Stopped 12/16/23 1237)      Medical Decision Making Amount and/or Complexity of Data Reviewed Labs: ordered. Radiology: ordered.  Risk Prescription drug management. Decision regarding hospitalization.   53 y.o. male presents to the ER for evaluation of with nausea vomiting and significant medical history. Differential diagnosis includes but is not limited to sepsis with splenic infarcts, electrolyte abnormality. Vital signs unremarkable.  Afebrile here however patient did have antipyretics shortly prior to  arrival. Physical exam as noted above.   Patient was seen here yesterday had full workup of labs with blood cultures and CT of the abdomen pelvis.  Was a new finding of splenic infarcts with known splenomegaly.  He was started on broad-spectrum antibiotics and plan was for admission however patient left against medical vice.  He has been unchanged and his symptoms so we will reorder labs however do not feel reordering imaging is necessary at this time.  I have started him on the broad-spectrum antibiotics he was on yesterday.  I independently reviewed and interpreted the patient's labs.  CBC shows mild anemia with hemoglobin 11.0 with hematocrit of 34.9.  Platelets at 40.  Appears to be around patient's baseline.  CMP shows sodium 131, bicarb of 21, glucose of 309 with known diabetes.  Calcium 8.3 with a albumin of 2.4.  Alk phos of 488 with a total bilirubin at 1.7.  Has a previous yesterday however alk phos still  at its elevated state.  Lactic acid elevated at 2.2 that improved to 1.2 after fluids and antibiotics were initiated.  Urinalysis shows concentrated urine with greater than 500 glucose present otherwise unremarkable.  PT/INR within normal limits.  Again, blood cultures were obtained yesterday.  Patient is willing to be admitted.  Discussed case with Dr. Tobie who will admit the patient.  She has an order rider quadrant ultrasound which has been ordered.  Portions of this report may have been transcribed using voice recognition software. Every effort was made to ensure accuracy; however, inadvertent computerized transcription errors may be present.    Final diagnoses:  None    ED Discharge Orders     None          Bernis Ernst, [place] 12/16/23 1635    Long, Fonda MATSU, MD 12/25/23 [phone]

## 2023-12-16 NOTE — Consult Note (Signed)
 Referring Provider:  ? Dr. Tobie Primary Care Physician:  [name], MD Primary Gastroenterologist:  [facility]  Reason for Consultation: Cirrhosis, fever, previous biliary obstruction  HPI: Lawrence Brock is a 53 y.o. 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  Seen by [facility] 08/2023 and general GI 10/2023.  Presented to [facility] with complaints of fever and abdominal pain.  Patient tells me that for about the past 3 weeks he has been having fevers, initially intermittent about every other day, but now for the past several days it had been more persistent.  Highest was 106.  Today he went to work and had fever and shaking chills and was brought to the emergency department.  He tells me that he has had some right back/flank pain and some intermittent left-sided abdominal pains as well.  He says that usually when he had the fever he would take medication and once the temperature came down he felt better.  No nausea or vomiting.  Has been eating well.  No black or bloody stools.  Has some chronic cough due to smoking, but no worsening of that.  No urinary symptoms.  AST 26, ALT 36, alk phos 488 down from over thousand in August, total bili 1.7.  Blood cultures ordered.  CT scan abdomen and pelvis with contrast:  IMPRESSION: 1. Stable cirrhotic changes and portal venous hypertension with portal venous collaterals and esophageal varices. 2. 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 3. Metallic common bile duct stent in place with not unexpected pneumobilia. 4. Stable changes of chronic  calcific pancreatitis with marked pancreatic atrophy. No acute inflammation.   Aortic Atherosclerosis (ICD10-I70.0).   Ultrasound RUQ:  IMPRESSION: Small volume layering biliary sludge. No cholecystolithiasis or changes of acute cholecystitis.    It looks like over the past several years he has received most of his care at [facility].  Just underwent EGD and ERCP on 11/21/2023.  EGD with esophageal variceal ligation x 4 and ERCP with biliary stent exchange with dilation, cytologic brushings, stone extraction.  Brushings from the common bile duct showed atypical cells.   GI History from last [facility] outpatient note on 11/10/2023:  Initially seen 11/2019 with choledocholithiasis, cholangitis requiring ERCP with stone extraction and stent placement. Repeat ERCP showed an edematous papilla, CBD dilation, stone extraction. Biopsies of papilla showed atypical epithelium concerning for a dysplastic process. Due to this abnormal papilla repeat EGD w/ sideviewer 05/09/20 w/ prominent papilla, biopsies showed normal glandular mucosa. He also had variceal ligation x 3 at this time and was put on propranolol .  Dr. Gretta 05/11/20 for consideration of cholecystectomy. Given severity of his portal hypertension and risks of cholecystectomy, was recommended additional follow-up, but out of contact while working in [place] .  Admission 10/23/20 with fever and repeat stones. ERCP 11/08/20 with no obvious filling defects, stent placed. EGD w/ 3 bands on large esophageal varices.  Dr. 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  Labs 11/23/20: CA 19-9 132, CEA 2.6, AFP 7.1  EGD/ERCP 12/08/20: CBD dilation up to ~ 12 mm, impacted stone was seen in the LHD s/p EHL, stent placement. No malignancy or strictures seen. EGD w/  endoscopic variceal ligation.  Dr. Gretta 12/28/20: Considering significant comorbidities of cirrhosis, patient will be at risk of  complications associated with cholecystectomy so continued observation and repeat ERCP's were recommended.  Admission 10/26-10/27/22 with concerns for cholangitis, left AMA  ERCP 02/02/21: Biliary stent exchange, cholangioscopy, EHL, spybites of edematous CBD, and stone extraction with overall improvement in stone burden. CBD biopsies negative for dysplasia or malignancy.  ERCP 03/14/21 with stent exchange and then removal 05/23/21  Admission 7/18-7/21/23 with SMV thrombus and concern for pancreas mass. MRI with mild CBD dilation 9mm and PD dilation to 5mm with T2 hypointense ill-defined spiculated enhancing mass centered at the pancreatic head.  10/10/21 CA 19-9 elevated 263  EUS 10/12/21: No mass identified for sampling. Underlying chronic pancreatitis was visualized in the pancreas.  MR pancreas 10/26/21 with similar pancreas findings of this pancreas mass  Admission 10/17-10/19/23 bacteremia  EUS 02/05/22: EUS-FNA of small hypoechoic area measuring ~15-20 mm in size in the HOP proximal to a large stone- unclear if represents a mass, collection, or mass-forming chronic pancreatitis. FNA - Rare epithelial cells. Inflammation.  EGD 05/2022 with esophageal variceal banding  MRCP 09/21/22 and 10/15/22 without change in ductal dilation, pancreatic calcifications  CA 19-9 elevated 354 (09/23/22)  Admission 11/2022 [facility] with fever and abdominal pain found to have multifocal hepatic abscesses.  Underwent CT-guided CT-guided drain placement on 12/14/22 with cx growing streptococcus constellatus.  IR drain removed 12/2022  EGD 12/2022 with small varices, no banding  Admission 03/2023 [facility] with abdominal pain, fever and elevated LFT's. He was treated supportively and CT with mention of chronic intrahepatic ductal dilation and mid/lower CBD stricture without CBD stone. Also mentioned chronic pancreatitis with ductal dilation and PD stent in the head.  Seen in GI clinic 1/30 with plan for ERCP for  increasing LFTs. Unfortunately, this was cancelled due to influenza A.  ERCP 3/25: Smooth stricture in distal CBD much improved from previous s/p balloon sweeps , brushings and stent placement. Path: atypical cells, favor reactive  Admitted to [facility] post procedure due to abdominal pain.  CA 19-9 elevated 1281 (3/25)  Fever and pain overnight - Blood culture NGTD  CT a/p W/ 3/25: Interval placement of biliary stent with pneumobilia, an expected postprocedural finding. Mild persistent intrahepatic biliary dilation that appears slightly improved since prior. Sequelae of chronic pancreatitis and portal hypertension  Hepatology has seen with plans for outpatient EGD for banding and BB recommendations.    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 pseudocyst in 08/2013. Follwed by Dr Toribio Cedar and Nelwyn Rather (at [facility]).     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    Hypertension    No meds yet.   Liver abscess 01/2014   Pneumonia    10-30-14 [facility] hospital admission, no problems now.   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    Thrombocytopenia 2015   with splenomegaly 03/2014.  s/p bone marrow biopsy, nonspecific findings 09/2015    Past Surgical History:  Procedure Laterality Date   ABSCESS DRAINAGE  01/16/2023   APPENDECTOMY     BIOPSY  05/11/2022   Procedure: BIOPSY;  Surgeon: Stacia Glendia BRAVO, MD;  Location: [facility]  ENDOSCOPY;  Service: Gastroenterology;;   COLONOSCOPY WITH PROPOFOL  N/A 01/05/2015   Toribio SHAUNNA Cedar, MD;  Thre 7 to 13 mm tubular adenomatous polyps removed. repeat colonoscopy suggested in 12/2017.     ENDOSCOPIC RETROGRADE CHOLANGIOPANCREATOGRAPHY (ERCP) WITH PROPOFOL   N/A 06/02/2014   Dr Toribio Cedar.  removed covered metal stent.  no stricture noted.  Rec: biliary diversion surgery if recurrent stricture arises.   ERCP N/A 02/22/2014   Gwendlyn ONEIDA Buddy, MD; with removal of plastic stent, replaced with metal biliary stent.  filling defect: stone, sludge, blood clots removed with baloon sweep.     ERCP N/A 02/18/2014   For suspected cholangitis. Lamar JONETTA Aho, MD; s/p sphincterotomy, blood clots and sludge swept out, remnant of pancreatic duct noted (removal not mentioned.  plastic stent placed into CBD.     ERCP, plastic pancreatic duct stent placement.  2007   Dr Norleen Albino at [facility].    ESOPHAGEAL BANDING  05/11/2022   Procedure: ESOPHAGEAL BANDING;  Surgeon: Stacia Glendia BRAVO, MD;  Location: [facility] ENDOSCOPY;  Service: Gastroenterology;;   ESOPHAGOGASTRODUODENOSCOPY N/A 05/11/2022   Procedure: ESOPHAGOGASTRODUODENOSCOPY (EGD);  Surgeon: Stacia Glendia BRAVO, MD;  Location: [facility] ENDOSCOPY;  Service: Gastroenterology;  Laterality: N/A;   ESOPHAGOGASTRODUODENOSCOPY (EGD) WITH PROPOFOL  N/A 09/14/2013   Dr Albertus.  erosive gastritis.  bulbar ulcer.  removal of plastic biliaty stent.     ESOPHAGOGASTRODUODENOSCOPY (EGD) WITH PROPOFOL  N/A 01/05/2015   for melena. Toribio SHAUNNA Cedar.  Large distal esophageal varices, no bleeding stigmata.  no gastric varices, not banded.  Moderate to severe portal hypertensive gastropathy.  Nadolol  initiated.    ESOPHAGOGASTRODUODENOSCOPY (EGD) WITH PROPOFOL  N/A 08/19/2016   Armbruster, Elspeth Mt, MD; Large esophageal varices with red whale signs, high risk for bleeding. 11 bands placed.  Portal hypertensive gastropathy.    FLEXIBLE SIGMOIDOSCOPY N/A 05/11/2022   Procedure: FLEXIBLE SIGMOIDOSCOPY;  Surgeon: Stacia Glendia BRAVO, MD;  Location: [facility] ENDOSCOPY;  Service: Gastroenterology;  Laterality: N/A;   GASTROINTESTINAL STENT REMOVAL N/A 09/14/2013   Gordy CHRISTELLA Albertus, MD; Pancreatic stent removal   IR CATHETER TUBE CHANGE  12/19/2022    IR PATIENT EVAL TECH 0-60 MINS  01/16/2023    Prior to Admission medications   Medication Sig Start Date End Date Taking? Authorizing Provider  acetaminophen  (TYLENOL ) 500 MG tablet Take 500-1,000 mg by mouth every 6 (six) hours as needed for moderate pain (pain score 4-6).   Yes [provider]  cetirizine (ZYRTEC) 10 MG tablet Take 10 mg by mouth daily.   Yes [provider]  CREON  36000-114000 units CPEP capsule Take 1-2 capsules by mouth See admin instructions. Take 2 capsules by mouth three times a day with meals and 1 capsule with snacks. 11/27/23  Yes [provider]  FLUoxetine  (PROZAC ) 20 MG capsule Take 20 mg by mouth daily. 12/07/22  Yes [provider]  JARDIANCE  25 MG TABS tablet Take 25 mg by mouth daily. 03/16/23  Yes [provider]  Multiple Vitamin (MULTIVITAMIN PO) Take 1 tablet by mouth daily.   Yes [provider]  OVER THE COUNTER MEDICATION Take 1 tablet by mouth daily. Blood Builder Mega Food   Yes [provider]  oxyCODONE  (OXY IR/ROXICODONE ) 5 MG immediate release tablet Take 1 tablet (5 mg total) by mouth every 6 (six) hours as needed for moderate pain (pain score 4-6). 04/08/23  Yes [place], Eric J, DO  pantoprazole  (PROTONIX ) 40 MG tablet Take 40 mg by mouth daily.   Yes [provider]  SYMBICORT 160-4.5 MCG/ACT inhaler Inhale 2 puffs into the lungs 2 (two) times daily. 11/11/22  Yes [provider]  TOUJEO MAX SOLOSTAR 300 UNIT/ML Solostar Pen Inject 14 Units into the skin at bedtime. 12/04/23  Yes [provider]    No current facility-administered medications for this encounter.   Current Outpatient Medications  Medication Sig Dispense Refill   acetaminophen  (TYLENOL ) 500 MG tablet Take 500-1,000 mg by mouth every 6 (six) hours as needed for moderate pain (pain score 4-6).     cetirizine (ZYRTEC) 10 MG tablet Take 10 mg by mouth daily.     CREON  36000-114000 units CPEP  capsule Take 1-2 capsules by mouth See admin instructions. Take 2 capsules by mouth three times a day with meals and 1 capsule with snacks.     FLUoxetine  (PROZAC ) 20 MG capsule Take 20 mg by mouth daily.     JARDIANCE  25 MG TABS tablet Take 25 mg by mouth daily.     Multiple Vitamin (MULTIVITAMIN PO) Take 1 tablet by mouth daily.     OVER THE COUNTER MEDICATION Take 1 tablet by mouth daily. Blood Builder Mega Food     oxyCODONE  (OXY IR/ROXICODONE ) 5 MG immediate release tablet Take 1 tablet (5 mg total) by mouth every 6 (six) hours as needed for moderate pain (pain score 4-6). 30 tablet 0   pantoprazole  (PROTONIX ) 40 MG tablet Take 40 mg by mouth daily.     SYMBICORT 160-4.5 MCG/ACT inhaler Inhale 2 puffs into the lungs 2 (two) times daily.     TOUJEO MAX SOLOSTAR 300 UNIT/ML Solostar Pen Inject 14 Units into the skin at bedtime.      Allergies as of 12/16/2023   (No Known Allergies)    Family History  Problem Relation Age of Onset   Hypertension Mother     Social History   Socioeconomic History   Marital status: Married    Spouse name: Not on file   Number of children: 4   Years of education: Not on file   Highest education level: Not on file  Occupational History   Occupation: Child psychotherapist  Tobacco Use   Smoking status: Every Day    Current packs/day: 0.50    Types: Cigarettes   Smokeless tobacco: Never  Substance and Sexual Activity   Alcohol use: No    Alcohol/week: 12.0 standard drinks of alcohol    Types: 12 Cans of beer per week    Comment: former ETOH abuse, quit around 2016 per pt   Drug use: No   Sexual activity: Yes  Other Topics Concern   Not on file  Social History Narrative   Not on file   Social Drivers of Health   Financial Resource Strain: Not on File (08/31/2018)   Received from [employer]    Financial Resource Strain: 0  Food Insecurity: Low Risk  (08/20/2023)   Received from [facility]   Hunger Vital Sign     Within the past 12 months, you worried that your food would run out before you got money to buy more: Never true    Within the past 12 months, the food you bought just didn't last and you didn't have money to get more. : Never true  Transportation Needs: No Transportation Needs (08/20/2023)   Received from [employer]    In the past 12 months, has lack of reliable transportation kept you from medical appointments, meetings, work or from getting things  needed for daily living? : No  Physical Activity: Not on File (08/31/2018)   Received from [facility]   Physical Activity    Physical Activity: 0  Stress: Not on File (08/31/2018)   Received from [facility]   Stress    Stress: 0  Social Connections: Moderately Isolated (04/07/2023)   Social Connection and Isolation Panel    Frequency of Communication with Friends and Family: More than three times a week    Frequency of Social Gatherings with Friends and Family: Twice a week    Attends Religious Services: Never    Database administrator or Organizations: No    Attends Banker Meetings: Never    Marital Status: Married  Catering manager Violence: Not At Risk (04/07/2023)   Humiliation, Afraid, Rape, and Kick questionnaire    Fear of Current or Ex-Partner: No    Emotionally Abused: No    Physically Abused: No    Sexually Abused: No    Review of Systems: ROS otherwise negative except as mentioned in HPI.  Physical Exam: Vital signs in last 24 hours: Temp:  [97.6 F (36.4 C)-98 F (36.7 C)] 97.9 F (36.6 C) (09/23 1445) Pulse Rate:  [54-78] 69 (09/23 1445) Resp:  [9-17] 16 (09/23 1445) BP: (94-109)/(66-81) 101/72 (09/23 1445) SpO2:  [93 %-100 %] 100 % (09/23 1445) Weight:  [55.8 kg] 55.8 kg (09/23 0956)   General:  Alert, Well-developed, well-nourished, pleasant and cooperative in NAD Head:  Normocephalic and atraumatic. Eyes:  Sclera clear, no icterus.  Conjunctiva pink. Ears:  Normal auditory acuity. Mouth:   No deformity or lesions.   Lungs:  Clear throughout to auscultation.  No wheezes, crackles, or rhonchi. Heart:  Regular rate and rhythm; no murmurs, clicks, rubs, or gallops. Abdomen:  Soft, non-distended.  BS present.  Non-tender. Msk:  Symmetrical without gross deformities. Pulses:  Normal pulses noted. Extremities:  Without clubbing or edema. Neurologic:  Alert and oriented x 4;  grossly normal neurologically. Skin:  Intact without significant lesions or rashes. Psych:  Alert and cooperative. Normal mood and affect.  Lab Results: Recent Labs    12/15/23 1236 12/15/23 1256 12/16/23 1002  WBC 6.0  --  5.4  HGB 10.4* 10.5* 11.0*  HCT 32.2* 31.0* 34.9*  PLT 38*  --  40*   BMET Recent Labs    12/15/23 1236 12/15/23 1256 12/16/23 1002  NA 132* 135 131*  K 3.9 4.0 3.8  CL 103 103 100  CO2 18*  --  21*  GLUCOSE 99 103* 309*  BUN 14 14 12   CREATININE 0.99 0.90 1.05  CALCIUM 8.2*  --  8.3*   LFT Recent Labs    12/16/23 1002  PROT 6.8  ALBUMIN 2.4*  AST 26  ALT 36  ALKPHOS 488*  BILITOT 1.7*   PT/INR Recent Labs    12/15/23 1236 12/16/23 1002  LABPROT 14.8 14.9  INR 1.1 1.1    Studies/Results: US  Abdomen Limited RUQ (LIVER/GB) Result Date: 12/16/2023 CLINICAL DATA:  151471 RUQ pain 151471 EXAM: ULTRASOUND ABDOMEN LIMITED RIGHT UPPER QUADRANT COMPARISON:  12/15/2023 FINDINGS: Gallbladder: Layering biliary sludge. No radiopaque stones. No wall thickening or pericholecystic fluid. No sonographic Murphy's sign noted by sonographer. Common bile duct: Diameter: 5 mm, upstream. The metallic biliary duct stent on the prior CT is not well visualized or evaluated. Liver: Normal echogenicity. No focal lesion identified. Similar diffuse intrahepatic biliary ductal dilation. Portal vein is patent on color Doppler imaging with normal direction of blood flow towards  the liver. Other: None. IMPRESSION: Small volume layering biliary sludge. No cholecystolithiasis or changes of acute  cholecystitis. Electronically Signed   By: Rogelia Myers M.D.   On: 12/16/2023 15:24   CT ABDOMEN PELVIS W CONTRAST Result Date: 12/15/2023 CLINICAL DATA:  Sepsis.  History of cirrhosis EXAM: CT ABDOMEN AND PELVIS WITH CONTRAST TECHNIQUE: Multidetector CT imaging of the abdomen and pelvis was performed using the standard protocol following bolus administration of intravenous contrast. RADIATION DOSE REDUCTION: This exam was performed according to the departmental dose-optimization program which includes automated exposure control, adjustment of the mA and/or kV according to patient size and/or use of iterative reconstruction technique. CONTRAST:  75mL OMNIPAQUE  IOHEXOL  350 MG/ML SOLN COMPARISON:  Multiple prior imaging studies. The most recent CT scan is 06/17/2023 and the most recent MRI is 09/18/2023 FINDINGS: Lower chest: Streaky subpleural atelectasis. No pleural effusions or pericardial effusion. No pulmonary infiltrates or pulmonary nodules. Hepatobiliary: Metallic common bile duct stent is in place and new since the prior CT scan. Associated not unexpected pneumobilia. No worrisome hepatic lesions. Stable cirrhotic changes. Again noted is cavernous transformation of the portal vein. Stable portal venous hypertension with portal venous collaterals and esophageal varices. Pancreas: Stable changes of chronic calcific pancreatitis with marked pancreatic atrophy. No acute inflammation. Spleen: Stable massive splenomegaly. 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 Adrenals/Urinary Tract: The adrenal glands and kidneys are unremarkable and stable. Simple bilateral renal cysts. No worrisome renal lesions or hydronephrosis. The bladder is unremarkable. Stomach/Bowel: The stomach, duodenum, small bowel and colon are grossly normal without oral contrast. No inflammatory changes, mass lesions or obstructive findings. Vascular/Lymphatic: Stable atherosclerotic calcifications involving the aorta  and iliac arteries. No aneurysm dissection. The major venous structures are patent except for chronic portal vein thrombosis. Reproductive: The prostate gland and seminal vesicles are unremarkable. Other: Mild chronic mesenteric edema. Small amount of free pelvic fluid noted no overt ascites. Musculoskeletal: No significant bony findings. IMPRESSION: 1. Stable cirrhotic changes and portal venous hypertension with portal venous collaterals and esophageal varices. 2. 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 3. Metallic common bile duct stent in place with not unexpected pneumobilia. 4. Stable changes of chronic calcific pancreatitis with marked pancreatic atrophy. No acute inflammation. Aortic Atherosclerosis (ICD10-I70.0). Electronically Signed   By: MYRTIS Stammer M.D.   On: 12/15/2023 16:31   DG Chest Port 1 View Result Date: 12/15/2023 EXAM: 1 VIEW XRAY OF THE CHEST 12/15/2023 01:02:00 PM COMPARISON: 06/16/2023 CLINICAL HISTORY: Questionable sepsis - evaluate for abnormality. Questionable sepsis; hx of pancreatitis, hypertension. FINDINGS: LINES, TUBES AND DEVICES: EKG leads noted. LUNGS AND PLEURA: No focal pulmonary opacity. No pulmonary edema. No pleural effusion. No pneumothorax. Skin fold over the upper right hemithorax. HEART AND MEDIASTINUM: No acute abnormality of the cardiac and mediastinal silhouettes. Mild left hemidiaphragm elevation. BONES AND SOFT TISSUES: No acute osseous abnormality. IMPRESSION: 1. No acute cardiopulmonary abnormality. Electronically signed by: Rockey Kilts MD 12/15/2023 01:36 PM EDT RP Workstation: [id number]    IMPRESSION:  *Fever for the past 3 weeks, initially intermittent and now regularly the past several days.  Temp 106 at the highest.  Having some right back/flank pain and occasional left sided abdominal pain. *met-ALD cirrhosis complicated by large esophageal varices with previous hemorrhage, recent banding x 4 on  11/21/2023.  Has [facility] by [facility]. *Biliary strictures and recurrent choledocholithiasis/cholangitis status post multiple ERCPs, the last ERCP 11/21/2023 with biliary stent exchange, stone extraction, and brushings (brushing showed  atypical cells).  Alk phos still elevated, but downtrending significantly since his stent exchange at the end of August. *Chronic calcific pancreatitis with atropy from alcohol, appears stable *New multiple peripheral splenic infarcts seen on CT scan *Thrombocytopenia with platelet count of 40K secondary to his cirrhosis, but is stable.  PLAN: -Most of his GI issues appear stable to improved.  The only new finding is the multiple peripheral splenic infarcts. -Full infectious work-up for fever.  Blood cultures pending/need to be collected. -Trend labs/LFTs. -Will allow diet for now.   Harlene BIRCH. Elvenia Godden  12/16/2023, 3:44 PM

## 2023-12-16 NOTE — Progress Notes (Signed)
 ED Pharmacy Antibiotic Sign Off An antibiotic consult was received from an ED provider for cefepime /vancomycin  per pharmacy dosing for sepsis. A chart review was completed to assess appropriateness.   The following one time order(s) were placed:  Cefepime  2g iv x1 Vancomycin  1g iv x1  Further antibiotic and/or antibiotic pharmacy consults should be ordered by the admitting provider if indicated.   Thank you for allowing pharmacy to be a part of this patient's care.   Koren LITTIE Or, [facility]  Clinical Pharmacist 12/16/23 11:29 AM

## 2023-12-16 NOTE — Progress Notes (Signed)
 Pharmacy Antibiotic Note  Lawrence Brock is a 53 y.o. male admitted on 12/16/2023 with sepsis.  Pharmacy has been consulted for Vancomycin  and Cefepime  dosing.  Plan: Cefepime  2 g Q8H Vancomycin  1250 mg Q24H - eAUC 533  Scr at baseline (~1). Continue to monitor Vanc levels as needed  Height: 5' 8.5 (174 cm) Weight: 55.8 kg (123 lb) IBW/kg (Calculated) : 69.55  Temp (24hrs), Avg:98.2 F (36.8 C), Min:97.6 F (36.4 C), Max:99.7 F (37.6 C)  Recent Labs  Lab 12/15/23 1236 12/15/23 1256 12/15/23 1319 12/16/23 1002 12/16/23 1102 12/16/23 1409  WBC 6.0  --   --  5.4  --   --   CREATININE 0.99 0.90  --  1.05  --   --   LATICACIDVEN  --   --  1.6  --  2.2* 1.2    Estimated Creatinine Clearance: 65 mL/min (by C-G formula based on SCr of 1.05 mg/dL).    No Known Allergies  Antimicrobials this admission: 9/23 Flagyl  >> Current 9/23 Cefepime  >> Current 9/23 Vancomycin  >> Current  Dose adjustments this admission: None  Microbiology results: 9/22 BCx: No growth < 24 hours   Thank you for allowing pharmacy to be a part of this patient's care.  Prentice DOROTHA Favors, PharmD PGY1 [facility] Pharmacy Administration and Leadership Resident [facility]  12/16/2023 7:02 PM

## 2023-12-16 NOTE — ED Notes (Signed)
 [facility] called.

## 2023-12-16 NOTE — ED Triage Notes (Signed)
 Patient arrives ambulatory by POV states he was here yesterday but had to leave for a court case and was told to come back. C/o generalized aches and soreness. Patient states he had labs, x-ray and ct scan yesterday.

## 2023-12-16 NOTE — H&P (Addendum)
 History and Physical    PatientErik Brock FMW:[id number] DOB: Aug 12, 1970 DOA: 12/16/2023 DOS: the patient was seen and examined on 12/16/2023 . PCP: [name], MD  Patient coming from: Home Chief complaint: Chief Complaint  Patient presents with   Fever   HPI:  Lawrence Brock is a 53 y.o. 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  Noted at that time also patient had intermittent fevers and chills.  Presents today with same states his symptoms have been worse over the past 1 month.  Patient had scheduled ERCP on November 25, 2023 for follow-up of his biliary obstruction.  Presents for fevers and chills and bilateral flank pain upper epigastric pain that have been going on intermittently for the past 1 month.  States he called his provider who recommended to stay at [facility]. GI history per GI note: Dr. Gretta 05/11/20 for consideration of cholecystectomy. Given severity of his portal hypertension and risks of cholecystectomy, was recommended additional follow-up, but out of contact while working in [place] .   Admission 10/23/20 with fever and repeat stones. ERCP 11/08/20 with no obvious filling defects, stent placed. EGD w/ 3 bands on large esophageal varices.   Dr. 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   Labs 11/23/20: CA 19-9 132, CEA 2.6, AFP 7.1   EGD/ERCP 12/08/20: CBD dilation up to ~ 12 mm, impacted stone was seen in the LHD s/p EHL, stent placement. No malignancy or strictures seen. EGD w/  endoscopic variceal ligation.   Dr. Gretta 12/28/20: Considering significant comorbidities of cirrhosis, patient will be at risk of complications associated with cholecystectomy so continued observation and repeat ERCP's were recommended.   Admission 10/26-10/27/22 with concerns for cholangitis, left AMA   ERCP 02/02/21: Biliary stent exchange, cholangioscopy, EHL, spybites of edematous CBD, and stone extraction with overall improvement in stone burden. CBD biopsies negative for dysplasia or malignancy.   ERCP 03/14/21 with stent exchange and then removal 05/23/21   Admission 7/18-7/21/23 with SMV thrombus and concern for pancreas mass. MRI with mild CBD dilation 9mm and PD dilation to 5mm with T2 hypointense ill-defined spiculated enhancing mass centered at the pancreatic head.   10/10/21 CA 19-9 elevated 263   EUS 10/12/21: No mass identified for sampling. Underlying chronic pancreatitis was visualized in the pancreas.   MR pancreas 10/26/21 with similar pancreas findings of this pancreas mass   Admission 10/17-10/19/23 bacteremia   EUS 02/05/22: EUS-FNA of small hypoechoic area measuring ~15-20 mm in size in the HOP proximal to a large stone- unclear if represents a mass, collection, or mass-forming chronic pancreatitis. FNA - Rare epithelial cells. Inflammation.   EGD 05/2022 with esophageal variceal banding   MRCP 09/21/22 and 10/15/22 without change in ductal dilation, pancreatic calcifications   CA 19-9 elevated 354 (09/23/22)   Admission 11/2022 [facility] with fever and abdominal pain found to have multifocal hepatic abscesses.   Underwent CT-guided CT-guided drain placement on 12/14/22 with cx growing streptococcus constellatus.   IR drain removed 12/2022   EGD 12/2022 with small varices, no banding   Admission 03/2023 [facility] with abdominal pain, fever  and elevated LFT's. He was treated supportively and CT with mention of chronic intrahepatic ductal dilation and mid/lower CBD stricture without  CBD stone. Also mentioned chronic pancreatitis with ductal dilation and PD stent in the head.   Seen in GI clinic 1/30 with plan for ERCP for increasing LFTs. Unfortunately, this was cancelled due to influenza A.   ERCP 3/25: Smooth stricture in distal CBD much improved from previous s/p balloon sweeps , brushings and stent placement. Path: atypical cells, favor reactive   Admitted to [facility] post procedure due to abdominal pain.   CA 19-9 elevated 1281 (3/25)   Fever and pain overnight - Blood culture NGTD   CT a/p W/ 3/25: Interval placement of biliary stent with pneumobilia, an expected postprocedural finding. Mild persistent intrahepatic biliary dilation that appears slightly improved since prior. Sequelae of chronic pancreatitis and portal hypertension   Hepatology has seen with plans for outpatient EGD for banding and BB recommendations.   06/20/23: CA 19-9: 736   Labs 4/14: Tbili 1.5, ALP 580, AST 43, ALT 63   ED Course:  Vital signs in the ED were notable for the following:  Vitals:   12/16/23 0956 12/16/23 1244 12/16/23 1445 12/16/23 1740  BP:  94/67 101/72 120/70  Pulse:  69 69 94  Temp:  97.7 F (36.5 C) 97.9 F (36.6 C) 99.7 F (37.6 C)  Resp:  16 16 16   Height: 5' 8.5 (1.74 m)     Weight: 55.8 kg     SpO2:  100% 100% 100%  TempSrc:  Oral Oral Oral  BMI (Calculated): 18.43     >>ED evaluation thus far shows: -Requested ultrasound shows small volume layering biliary sludge no cholecysto lithiasis or changes of acute cholecystitis. - CMP shows sodium 131 glucose 309 calcium 8.3 alk phos 488 albumin 2.4 total bili 1.7 GGT is 431.  >>While in the ED patient received the following: Medications  vancomycin  (VANCOCIN ) IVPB 1000 mg/200 mL premix (1,000 mg Intravenous New Bag/Given 12/16/23 1241)  metroNIDAZOLE  (FLAGYL ) IVPB 500 mg (0 mg Intravenous Stopped 12/16/23 1300)  HYDROmorphone  (DILAUDID ) injection 1 mg (1 mg Intravenous Given 12/16/23 1131)  sodium chloride  0.9  % bolus 1,000 mL (0 mLs Intravenous Stopped 12/16/23 1257)  ceFEPIme  (MAXIPIME ) 2 g in sodium chloride  0.9 % 100 mL IVPB (0 g Intravenous Stopped 12/16/23 1237)   Review of Systems  Gastrointestinal:  Positive for abdominal pain and nausea.  Genitourinary:  Positive for flank pain.   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 pseudocyst in 08/2013. Follwed by Dr Toribio Cedar and Nelwyn Rather (at [facility]).     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    Hypertension    No meds yet.   Liver abscess 01/2014   Pneumonia    10-30-14 [facility] hospital admission, no problems now.   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    Thrombocytopenia 2015   with splenomegaly 03/2014.  s/p bone marrow biopsy, nonspecific findings 09/2015   Past Surgical History:  Procedure Laterality Date   ABSCESS DRAINAGE  01/16/2023   APPENDECTOMY     BIOPSY  05/11/2022   Procedure: BIOPSY;  Surgeon: Stacia Glendia BRAVO, MD;  Location: [facility] ENDOSCOPY;  Service: Gastroenterology;;   COLONOSCOPY WITH PROPOFOL  N/A 01/05/2015   Toribio SHAUNNA Cedar, MD;  Thre 7 to 13 mm tubular adenomatous polyps removed. repeat colonoscopy suggested in 12/2017.     ENDOSCOPIC RETROGRADE CHOLANGIOPANCREATOGRAPHY (ERCP) WITH PROPOFOL  N/A 06/02/2014   Dr Toribio Cedar.  removed covered metal stent.  no stricture noted.  Rec: biliary diversion surgery if recurrent stricture arises.   ERCP N/A 02/22/2014   Gwendlyn ONEIDA Buddy, MD; with removal of plastic stent, replaced with metal biliary stent.  filling defect: stone, sludge, blood clots removed with baloon sweep.     ERCP N/A 02/18/2014   For suspected cholangitis. Lamar JONETTA Aho, MD; s/p sphincterotomy, blood clots  and sludge swept out, remnant of pancreatic duct noted (removal not mentioned.  plastic stent placed into CBD.     ERCP, plastic pancreatic duct stent placement.  2007   Dr Norleen Albino at [facility].    ESOPHAGEAL BANDING  05/11/2022   Procedure: ESOPHAGEAL BANDING;  Surgeon: Stacia Glendia BRAVO, MD;  Location: [facility] ENDOSCOPY;  Service: Gastroenterology;;   ESOPHAGOGASTRODUODENOSCOPY N/A 05/11/2022   Procedure: ESOPHAGOGASTRODUODENOSCOPY (EGD);  Surgeon: Stacia Glendia BRAVO, MD;  Location: [facility] ENDOSCOPY;  Service: Gastroenterology;  Laterality: N/A;   ESOPHAGOGASTRODUODENOSCOPY (EGD) WITH PROPOFOL  N/A 09/14/2013   Dr Albertus.  erosive gastritis.  bulbar ulcer.  removal of plastic biliaty stent.     ESOPHAGOGASTRODUODENOSCOPY (EGD) WITH PROPOFOL  N/A 01/05/2015   for melena. Toribio SHAUNNA Cedar.  Large distal esophageal varices, no bleeding stigmata.  no gastric varices, not banded.  Moderate to severe portal hypertensive gastropathy.  Nadolol  initiated.    ESOPHAGOGASTRODUODENOSCOPY (EGD) WITH PROPOFOL  N/A 08/19/2016   Armbruster, Elspeth Mt, MD; Large esophageal varices with red whale signs, high risk for bleeding. 11 bands placed.  Portal hypertensive gastropathy.    FLEXIBLE SIGMOIDOSCOPY N/A 05/11/2022   Procedure: FLEXIBLE SIGMOIDOSCOPY;  Surgeon: Stacia Glendia BRAVO, MD;  Location: [facility] ENDOSCOPY;  Service: Gastroenterology;  Laterality: N/A;   GASTROINTESTINAL STENT REMOVAL N/A 09/14/2013   Gordy CHRISTELLA Albertus, MD; Pancreatic stent removal   IR CATHETER TUBE CHANGE  12/19/2022   IR PATIENT EVAL TECH 0-60 MINS  01/16/2023    reports that he has been smoking cigarettes. He has never used smokeless tobacco. He reports that he does not drink alcohol and does not use drugs. No Known Allergies Family History  Problem Relation Age of Onset   Hypertension Mother    Prior to Admission medications   Medication Sig Start Date End Date Taking? Authorizing Provider  acetaminophen  (TYLENOL ) 500 MG tablet Take  500-1,000 mg by mouth every 6 (six) hours as needed for moderate pain (pain score 4-6).    [provider]  CREON  36000-114000 units CPEP capsule Take 1-2 capsules by mouth See admin instructions. Take 2 capsules by mouth three times a day with meals and 1 capsule with snacks. 11/27/23   [provider]  FLUoxetine  (PROZAC ) 20 MG capsule Take 20 mg by mouth daily. 12/07/22   [provider]  JARDIANCE  25 MG TABS tablet Take 25 mg by mouth daily. 03/16/23   [provider]  Multiple Vitamin (MULTIVITAMIN PO) Take 1 tablet by mouth daily.    [provider]  OVER THE COUNTER MEDICATION Take 1 tablet by mouth daily. Blood Optometrist, Historical, MD  oxyCODONE  (OXY IR/ROXICODONE ) 5 MG immediate release tablet Take 1 tablet (5 mg total) by mouth every 6 (six) hours as needed for moderate pain (pain  score 4-6). 04/08/23   [place], Camellia PARAS, DO  pantoprazole  (PROTONIX ) 40 MG tablet Take 40 mg by mouth daily.    [provider]  SYMBICORT 160-4.5 MCG/ACT inhaler Inhale 2 puffs into the lungs 2 (two) times daily. 11/11/22   [provider]  TOUJEO MAX SOLOSTAR 300 UNIT/ML Solostar Pen Inject 14 Units into the skin at bedtime. 12/04/23   [provider]                                                                                 Vitals:   12/16/23 9043 12/16/23 1244 12/16/23 1445 12/16/23 1740  BP:  94/67 101/72 120/70  Pulse:  69 69 94  Resp:  16 16 16   Temp:  97.7 F (36.5 C) 97.9 F (36.6 C) 99.7 F (37.6 C)  TempSrc:  Oral Oral Oral  SpO2:  100% 100% 100%  Weight: 55.8 kg     Height: 5' 8.5 (1.74 m)      Physical Exam Vitals and nursing note reviewed.  Constitutional:      General: He is not in acute distress. HENT:     Head: Normocephalic and atraumatic.     Right Ear: Hearing normal.     Left Ear: Hearing normal.     Nose: No nasal deformity.     Mouth/Throat:     Lips: Pink.  Eyes:     General:  Lids are normal.     Extraocular Movements: Extraocular movements intact.  Cardiovascular:     Rate and Rhythm: Normal rate and regular rhythm.     Heart sounds: Normal heart sounds.  Pulmonary:     Effort: Pulmonary effort is normal.     Breath sounds: Normal breath sounds.  Abdominal:     General: Bowel sounds are normal. There is no distension.     Palpations: Abdomen is soft. There is no mass.     Tenderness: There is abdominal tenderness in the right upper quadrant, epigastric area and left upper quadrant. There is right CVA tenderness, left CVA tenderness and guarding.  Musculoskeletal:     Right lower leg: No edema.     Left lower leg: No edema.  Skin:    General: Skin is warm.  Neurological:     General: No focal deficit present.     Mental Status: He is alert and oriented to person, place, and time.     Cranial Nerves: Cranial nerves 2-12 are intact.  Psychiatric:        Speech: Speech normal.     Labs on Admission: I have personally reviewed following labs and imaging studies CBC: Recent Labs  Lab 12/15/23 1236 12/15/23 1256 12/16/23 1002  WBC 6.0  --  5.4  NEUTROABS 5.4  --  4.3  HGB 10.4* 10.5* 11.0*  HCT 32.2* 31.0* 34.9*  MCV 89.0  --  90.2  PLT 38*  --  40*   Basic Metabolic Panel: Recent Labs  Lab 12/15/23 1236 12/15/23 1256 12/16/23 1002  NA 132* 135 131*  K 3.9 4.0 3.8  CL 103 103 100  CO2 18*  --  21*  GLUCOSE 99 103* 309*  BUN 14 14 12  CREATININE 0.99 0.90 1.05  CALCIUM 8.2*  --  8.3*   GFR: Estimated Creatinine Clearance: 65 mL/min (by C-G formula based on SCr of 1.05 mg/dL). Liver Function Tests: Recent Labs  Lab 12/15/23 1236 12/16/23 1002  AST 31 26  ALT 43 36  ALKPHOS 475* 488*  BILITOT 2.7* 1.7*  PROT 6.6 6.8  ALBUMIN 2.5* 2.4*   Recent Labs  Lab 12/15/23 1236  LIPASE <10*   No results for input(s): AMMONIA in the last 168 hours. Recent Labs    12/21/22 0539 01/07/23 1519 02/12/23 1617 02/27/23 0728  04/06/23 1821 04/07/23 0925 04/08/23 0338 12/15/23 1236 12/15/23 1256 12/16/23 1002  BUN 10 21 16 15 16  22* 22* 14 14 12   CREATININE 0.73 1.00 1.10 0.98 0.84 1.09 1.08 0.99 0.90 1.05    Cardiac Enzymes: No results for input(s): CKTOTAL, CKMB, CKMBINDEX, TROPONINI in the last 168 hours. BNP (last 3 results) No results for input(s): PROBNP in the last 8760 hours. HbA1C: Recent Labs    12/16/23 1731  HGBA1C 7.3*   CBG: No results for input(s): GLUCAP in the last 168 hours. Lipid Profile: No results for input(s): CHOL, HDL, LDLCALC, TRIG, CHOLHDL, LDLDIRECT in the last 72 hours. Thyroid Function Tests: No results for input(s): TSH, T4TOTAL, FREET4, T3FREE, THYROIDAB in the last 72 hours. Anemia Panel: No results for input(s): VITAMINB12, FOLATE, FERRITIN, TIBC, IRON, RETICCTPCT in the last 72 hours. Urine analysis:    Component Value Date/Time   COLORURINE YELLOW 12/16/2023 1034   APPEARANCEUR CLEAR 12/16/2023 1034   LABSPEC 1.031 (H) 12/16/2023 1034   PHURINE 5.0 12/16/2023 1034   GLUCOSEU >=500 (A) 12/16/2023 1034   HGBUR NEGATIVE 12/16/2023 1034   BILIRUBINUR NEGATIVE 12/16/2023 1034   KETONESUR NEGATIVE 12/16/2023 1034   PROTEINUR NEGATIVE 12/16/2023 1034   UROBILINOGEN 0.2 02/25/2014 1320   NITRITE NEGATIVE 12/16/2023 1034   LEUKOCYTESUR NEGATIVE 12/16/2023 1034   Radiological Exams on Admission: US  Abdomen Limited RUQ (LIVER/GB) Result Date: 12/16/2023 CLINICAL DATA:  151471 RUQ pain 151471 EXAM: ULTRASOUND ABDOMEN LIMITED RIGHT UPPER QUADRANT COMPARISON:  12/15/2023 FINDINGS: Gallbladder: Layering biliary sludge. No radiopaque stones. No wall thickening or pericholecystic fluid. No sonographic Murphy's sign noted by sonographer. Common bile duct: Diameter: 5 mm, upstream. The metallic biliary duct stent on the prior CT is not well visualized or evaluated. Liver: Normal echogenicity. No focal lesion identified. Similar  diffuse intrahepatic biliary ductal dilation. Portal vein is patent on color Doppler imaging with normal direction of blood flow towards the liver. Other: None. IMPRESSION: Small volume layering biliary sludge. No cholecystolithiasis or changes of acute cholecystitis. Electronically Signed   By: Rogelia Myers M.D.   On: 12/16/2023 15:24   CT ABDOMEN PELVIS W CONTRAST Result Date: 12/15/2023 CLINICAL DATA:  Sepsis.  History of cirrhosis EXAM: CT ABDOMEN AND PELVIS WITH CONTRAST TECHNIQUE: Multidetector CT imaging of the abdomen and pelvis was performed using the standard protocol following bolus administration of intravenous contrast. RADIATION DOSE REDUCTION: This exam was performed according to the departmental dose-optimization program which includes automated exposure control, adjustment of the mA and/or kV according to patient size and/or use of iterative reconstruction technique. CONTRAST:  75mL OMNIPAQUE  IOHEXOL  350 MG/ML SOLN COMPARISON:  Multiple prior imaging studies. The most recent CT scan is 06/17/2023 and the most recent MRI is 09/18/2023 FINDINGS: Lower chest: Streaky subpleural atelectasis. No pleural effusions or pericardial effusion. No pulmonary infiltrates or pulmonary nodules. Hepatobiliary: Metallic common bile duct stent is in place and new since the prior CT  scan. Associated not unexpected pneumobilia. No worrisome hepatic lesions. Stable cirrhotic changes. Again noted is cavernous transformation of the portal vein. Stable portal venous hypertension with portal venous collaterals and esophageal varices. Pancreas: Stable changes of chronic calcific pancreatitis with marked pancreatic atrophy. No acute inflammation. Spleen: Stable massive splenomegaly. 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 Adrenals/Urinary Tract: The adrenal glands and kidneys are unremarkable and stable. Simple bilateral renal cysts. No worrisome renal lesions or hydronephrosis. The bladder is  unremarkable. Stomach/Bowel: The stomach, duodenum, small bowel and colon are grossly normal without oral contrast. No inflammatory changes, mass lesions or obstructive findings. Vascular/Lymphatic: Stable atherosclerotic calcifications involving the aorta and iliac arteries. No aneurysm dissection. The major venous structures are patent except for chronic portal vein thrombosis. Reproductive: The prostate gland and seminal vesicles are unremarkable. Other: Mild chronic mesenteric edema. Small amount of free pelvic fluid noted no overt ascites. Musculoskeletal: No significant bony findings. IMPRESSION: 1. Stable cirrhotic changes and portal venous hypertension with portal venous collaterals and esophageal varices. 2. 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 3. Metallic common bile duct stent in place with not unexpected pneumobilia. 4. Stable changes of chronic calcific pancreatitis with marked pancreatic atrophy. No acute inflammation. Aortic Atherosclerosis (ICD10-I70.0). Electronically Signed   By: MYRTIS Stammer M.D.   On: 12/15/2023 16:31   DG Chest Port 1 View Result Date: 12/15/2023 EXAM: 1 VIEW XRAY OF THE CHEST 12/15/2023 01:02:00 PM COMPARISON: 06/16/2023 CLINICAL HISTORY: Questionable sepsis - evaluate for abnormality. Questionable sepsis; hx of pancreatitis, hypertension. FINDINGS: LINES, TUBES AND DEVICES: EKG leads noted. LUNGS AND PLEURA: No focal pulmonary opacity. No pulmonary edema. No pleural effusion. No pneumothorax. Skin fold over the upper right hemithorax. HEART AND MEDIASTINUM: No acute abnormality of the cardiac and mediastinal silhouettes. Mild left hemidiaphragm elevation. BONES AND SOFT TISSUES: No acute osseous abnormality. IMPRESSION: 1. No acute cardiopulmonary abnormality. Electronically signed by: Rockey Kilts MD 12/15/2023 01:36 PM EDT RP Workstation: [id number]   Data Reviewed: Relevant notes from primary care and specialist visits,  past discharge summaries as available in EHR, including Care Everywhere . Prior diagnostic testing as pertinent to current admission diagnoses, Updated medications and problem lists for reconciliation .ED course, including vitals, labs, imaging, treatment and response to treatment,Triage notes, nursing and pharmacy notes and ED provider's notes.Notable results as noted in HPI.Discussed case with EDMD/ ED APP/ or Specialty MD on call and as needed.  Assessment & Plan  Patient is a 53 year old with extensive history of biliary stricture pancreatitis hepatic abscesses coming with previous similar presentation now with flank pain and fevers send epigastric pain seen initially yesterday meeting sepsis criteria patient had cultures collected yesterday and was started on broad-spectrum antibiotics and due to a court date left AMA returning today.  >> Fevers chills/abdominal pain/sepsis: In light of his extensive GI history with pancreatitis, recurrent liver abscess, biliary stricture s/p stent placement, cirrhosis I suspect his presentation is again secondary to gallbladder or biliary issue.  Have requested GI consult for same.  Differentials include cholecystitis choledocholithiasis cholangitis less likely.  Will reculture patient today and continue patient on broad-spectrum IV antibiotics. Based on ultrasound patient does have biliary sludge we will curbside general surgery are consult for opinion.  Differentials is also include pancreatitis and lipase is pending.  >>Splenic Infarcts: We will start with cont iv abx and 2 d Echo. Optimize glucose control A1c pending. Blood cultures collected yesterday negative prelim x 1 day.    >> History of  alcohol abuse/compensated liver cirrhosis without ascites: AST and ALT are within normal limits, elevated total bili at 1.7 alk phos of 488 and GGT of 431.  INR is 1.1.SCD's 2/2 to thrombocytopenia.   >> History of chronic pancreatitis: Lipase is pending, continue  patient's Creon  and a clear liquid diet.   >> COPD: On albuterol .   >> Diabetes mellitus type 2: Glycemic protocol and sliding scale insulin  regimen every 4 hourly. A1c added on is 7.3.   >> Chronic SMV and portal vein thrombosis: No noted anticoagulation due to thrombocytopenia.   >>C/H pancreatitis: Cont creon  and clear liquid diet.   DVT prophylaxis:  SCD's  Consults:  GI  Advance Care Planning:  Full Code.   Family Communication:  None Disposition Plan:  Home Severity of Illness: The appropriate patient status for this patient is INPATIENT. Inpatient status is judged to be reasonable and necessary in order to provide the required intensity of service to ensure the patient's safety. The patient's presenting symptoms, physical exam findings, and initial radiographic and laboratory data in the context of their chronic comorbidities is felt to place them at high risk for further clinical deterioration. Furthermore, it is not anticipated that the patient will be medically stable for discharge from the hospital within 2 midnights of admission.   * I certify that at the point of admission it is my clinical judgment that the patient will require inpatient hospital care spanning beyond 2 midnights from the point of admission due to high intensity of service, high risk for further deterioration and high frequency of surveillance required.*  Unresulted Labs (From admission, onward)     Start     Ordered   12/17/23 0500  Comprehensive metabolic panel  Tomorrow morning,   R        12/16/23 1609   12/17/23 0500  CBC  Tomorrow morning,   R        12/16/23 1609   12/17/23 0500  Magnesium  Tomorrow morning,   R        12/16/23 1609   12/16/23 1610  Magnesium  Add-on,   AD        12/16/23 1609   12/16/23 1607  HIV Antibody (routine testing w rflx)  (HIV Antibody (Routine testing w reflex) panel)  Once,   R        12/16/23 1609   12/16/23 1545  Urine Culture (for pregnant,  neutropenic or urologic patients or patients with an indwelling urinary catheter)  (Urine Labs)  Add-on,   AD       Question:  Indication  Answer:  Flank Pain   12/16/23 1544   12/16/23 1535  Lipase, blood  Add-on,   AD        12/16/23 1534            Meds ordered this encounter  Medications   metroNIDAZOLE  (FLAGYL ) IVPB 500 mg    Antibiotic Indication::   Other Indication (list below)    Other Indication::   sepsis   HYDROmorphone  (DILAUDID ) injection 1 mg   sodium chloride  0.9 % bolus 1,000 mL   ceFEPIme  (MAXIPIME ) 2 g in sodium chloride  0.9 % 100 mL IVPB    Antibiotic Indication::   Sepsis   vancomycin  (VANCOCIN ) IVPB 1000 mg/200 mL premix    Indication::   Sepsis   DISCONTD: acetaminophen  (TYLENOL ) tablet 500-1,000 mg   lipase/protease/amylase (CREON ) capsule 72,000 Units   FLUoxetine  (PROZAC ) capsule 20 mg   DISCONTD: fluticasone  furoate-vilanterol (BREO ELLIPTA ) 200-25  MCG/ACT 1 puff   insulin  aspart (novoLOG ) injection 0-15 Units    Correction coverage::   Moderate (average weight, post-op)    CBG < 70::   implement hypoglycemia protocol    CBG 70 - 120::   0 units    CBG 121 - 150::   2 units    CBG 151 - 200::   3 units    CBG 201 - 250::   5 units    CBG 251 - 300::   8 units    CBG 301 - 350::   11 units    CBG 351 - 400::   15 units    CBG > 400:   call MD and obtain STAT lab verification   lactated ringers  infusion   OR Linked Order Group    acetaminophen  (TYLENOL ) tablet 650 mg    acetaminophen  (TYLENOL ) suppository 650 mg   DISCONTD: morphine  (PF) 2 MG/ML injection 2 mg   OR Linked Order Group    ondansetron  (ZOFRAN ) tablet 4 mg    ondansetron  (ZOFRAN ) injection 4 mg   pantoprazole  (PROTONIX ) injection 40 mg   lipase/protease/amylase (CREON ) capsule 36,000 Units   fluticasone  furoate-vilanterol (BREO ELLIPTA ) 200-25 MCG/ACT 1 puff   HYDROmorphone  (DILAUDID ) injection 1 mg   metroNIDAZOLE  (FLAGYL ) IVPB 500 mg    Antibiotic Indication::   Other  Indication (list below)    Other Indication::   sepsis     Orders Placed This Encounter  Procedures   Urine Culture (for pregnant, neutropenic or urologic patients or patients with an indwelling urinary catheter)   US  Abdomen Limited RUQ (LIVER/GB)   CBC with Differential   Comprehensive metabolic panel   Protime-INR   Urinalysis, Routine w reflex microscopic -Urine, Clean Catch   Gamma GT   Lipase, blood   Hemoglobin A1c   HIV Antibody (routine testing w rflx)   Comprehensive metabolic panel   CBC   Magnesium   Magnesium   Diet regular Room service appropriate? Yes; Fluid consistency: Thin   Apply Diabetes Mellitus Care Plan   STAT CBG when hypoglycemia is suspected. If treated, recheck every 15 minutes after each treatment until CBG >/= 70 mg/dl   Refer to Hypoglycemia Protocol Sidebar Report for treatment of CBG < 70 mg/dl   No HS correction Insulin    SCDs   Cardiac Monitoring Continuous x 24 hours Indications for use: Other; other indications for use: Monitor for ischemia   Vital signs   Notify physician (specify)   Mobility Protocol: No Restrictions   Refer to Sidebar Report Mobility Protocol for Adult Inpatient   Initiate Adult Central Line Maintenance and Catheter Protocol for patients with central line (CVC, PICC, Port, Hemodialysis, Trialysis)   If patient diabetic or glucose greater than 140 notify physician for Sliding Scale Insulin  Orders   Intake and Output   Initiate CHG Protocol   Do not place and if present remove PureWick   Initiate Oral Care Protocol   Initiate Carrier Fluid Protocol   RN may order General Admission PRN Orders utilizing General Admission PRN medications (through manage orders) for the following patient needs: allergy symptoms (Claritin), cold sores (Carmex), cough (Robitussin DM), eye irritation (Liquifilm Tears), hemorrhoids (Tucks), indigestion (Maalox), minor skin irritation (Hydrocortisone Cream), muscle pain Lucienne Gay), nose irritation  (saline nasal spray) and sore throat (Chloraseptic spray).   Full code   Consult for [facility] Admission   vancomycin  per pharmacy consult   CeFEPIme  (MAXIPIME ) per pharmacy consult  Pulse oximetry check with vital signs   Oxygen therapy Mode or (Route): Nasal cannula; Liters Per Minute: 2; Keep O2 saturation between: greater than 92 %   Incentive spirometry   I-Stat CG4 Lactic Acid   ECHOCARDIOGRAM COMPLETE   Admit to Inpatient (patient's expected length of stay will be greater than 2 midnights or inpatient only procedure)   Aspiration precautions    Author: Mario LULLA Blanch, MD 12 pm- 8 pm. [facility]. 12/16/2023 6:10 PM Please note for any communication after hours contact [facility] Assigned provider on call on Amion.

## 2023-12-17 ENCOUNTER — Inpatient Hospital Stay (HOSPITAL_COMMUNITY)

## 2023-12-17 ENCOUNTER — Other Ambulatory Visit (HOSPITAL_COMMUNITY)

## 2023-12-17 DIAGNOSIS — R509 Fever, unspecified: Secondary | ICD-10-CM | POA: Diagnosis not present

## 2023-12-17 DIAGNOSIS — K8037 Calculus of bile duct with acute and chronic cholangitis with obstruction: Secondary | ICD-10-CM

## 2023-12-17 DIAGNOSIS — R748 Abnormal levels of other serum enzymes: Secondary | ICD-10-CM | POA: Diagnosis not present

## 2023-12-17 DIAGNOSIS — K7030 Alcoholic cirrhosis of liver without ascites: Secondary | ICD-10-CM

## 2023-12-17 DIAGNOSIS — I38 Endocarditis, valve unspecified: Secondary | ICD-10-CM

## 2023-12-17 LAB — COMPREHENSIVE METABOLIC PANEL WITH GFR
ALT: 31 U/L (ref 0–44)
AST: 19 U/L (ref 15–41)
Albumin: 2.2 g/dL — ABNORMAL LOW (ref 3.5–5.0)
Alkaline Phosphatase: 458 U/L — ABNORMAL HIGH (ref 38–126)
Anion gap: 7 (ref 5–15)
BUN: 10 mg/dL (ref 6–20)
CO2: 25 mmol/L (ref 22–32)
Calcium: 7.9 mg/dL — ABNORMAL LOW (ref 8.9–10.3)
Chloride: 102 mmol/L (ref 98–111)
Creatinine, Ser: 1.01 mg/dL (ref 0.61–1.24)
GFR, Estimated: >60 mL/min (ref >60)
Glucose, Bld: 145 mg/dL — ABNORMAL HIGH (ref 70–99)
Potassium: 3.7 mmol/L (ref 3.5–5.1)
Sodium: 134 mmol/L — ABNORMAL LOW (ref 135–145)
Total Bilirubin: 1.5 mg/dL — ABNORMAL HIGH (ref 0.0–1.2)
Total Protein: 6.1 g/dL — ABNORMAL LOW (ref 6.5–8.1)

## 2023-12-17 LAB — CBC
HCT: 31.6 % — ABNORMAL LOW (ref 39.0–52.0)
Hemoglobin: 10.3 g/dL — ABNORMAL LOW (ref 13.0–17.0)
MCH: 28.5 pg (ref 26.0–34.0)
MCHC: 32.6 g/dL (ref 30.0–36.0)
MCV: 87.5 fL (ref 80.0–100.0)
Platelets: 34 K/uL — ABNORMAL LOW (ref 150–400)
RBC: 3.61 MIL/uL — ABNORMAL LOW (ref 4.22–5.81)
RDW: 14.5 % (ref 11.5–15.5)
WBC: 3.6 K/uL — ABNORMAL LOW (ref 4.0–10.5)
nRBC: 0 % (ref 0.0–0.2)

## 2023-12-17 LAB — GLUCOSE, CAPILLARY
Glucose-Capillary: 159 mg/dL — ABNORMAL HIGH (ref 70–99)
Glucose-Capillary: 209 mg/dL — ABNORMAL HIGH (ref 70–99)
Glucose-Capillary: 151 mg/dL — ABNORMAL HIGH (ref 70–99)
Glucose-Capillary: 176 mg/dL — ABNORMAL HIGH (ref 70–99)
Glucose-Capillary: 220 mg/dL — ABNORMAL HIGH (ref 70–99)

## 2023-12-17 LAB — ECHOCARDIOGRAM COMPLETE
Area-P 1/2: 3.37 cm2
Calc EF: 64.6 %
Height: 68.5 in
S' Lateral: 2.74 cm
Single Plane A2C EF: 65.1 %
Single Plane A4C EF: 63.3 %
Weight: 1968 [oz_av]

## 2023-12-17 LAB — MAGNESIUM: Magnesium: 1.9 mg/dL (ref 1.7–2.4)

## 2023-12-17 MED ORDER — METOPROLOL TARTRATE 5 MG/5ML IV SOLN
5.0000 mg | INTRAVENOUS | Status: DC | PRN
Start: 2023-12-17 — End: 2023-12-18

## 2023-12-17 MED ORDER — PANCRELIPASE (LIP-PROT-AMYL) 36000-114000 UNITS PO CPEP: 36000.0000 [IU] | ORAL_CAPSULE | ORAL | Status: DC | PRN

## 2023-12-17 MED ORDER — SENNOSIDES-DOCUSATE SODIUM 8.6-50 MG PO TABS: 1.0000 | ORAL_TABLET | Freq: Every evening | ORAL | Status: DC | PRN

## 2023-12-17 MED ORDER — OXYCODONE HCL 5 MG PO TABS
5.0000 mg | ORAL_TABLET | Freq: Four times a day (QID) | ORAL | Status: DC | PRN
  Administered 2023-12-18: 5 mg via ORAL
  Filled 2023-12-17: qty 1

## 2023-12-17 MED ORDER — HYDRALAZINE HCL 20 MG/ML IJ SOLN
10.0000 mg | INTRAMUSCULAR | Status: DC | PRN
Start: 2023-12-17 — End: 2023-12-18

## 2023-12-17 MED ORDER — GLUCAGON HCL RDNA (DIAGNOSTIC) 1 MG IJ SOLR: 1.0000 mg | INTRAMUSCULAR | Status: DC | PRN

## 2023-12-17 MED ORDER — HYDROMORPHONE HCL 1 MG/ML IJ SOLN
1.0000 mg | Freq: Four times a day (QID) | INTRAMUSCULAR | Status: AC | PRN
  Administered 2023-12-17 (×2): 1 mg via INTRAVENOUS
  Filled 2023-12-17 (×2): qty 1

## 2023-12-17 MED ORDER — IPRATROPIUM-ALBUTEROL 0.5-2.5 (3) MG/3ML IN SOLN: 3.0000 mL | RESPIRATORY_TRACT | Status: DC | PRN

## 2023-12-17 NOTE — Progress Notes (Signed)
  Echocardiogram 2D Echocardiogram has been performed.  Lawrence Brock 12/17/2023, 3:26 PM

## 2023-12-17 NOTE — Hospital Course (Addendum)
 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  Patient presents with fevers and bilateral flank pain.  LB GI consulted.  ERCP on 11/21/2023 for stent exchange, stone extraction and biliary stent was placed.  Currently recommending conservative management.  Assessment & Plan:  Fever/chills with abdominal pain - Prior complicated history of hepatobiliary tree/cirrhosis.  Concerns of possible cholangitis given prior history and ERCP in the past.  Continue broad-spectrum antibiotics and monitor culture data.  LFTs are relatively normal, elevated ALP. 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  Continue PPI IV twice daily.  Will transition to p.o. as appropriate  Chronic pancreatitis - Continue Creon   COPD - As needed bronchodilators  Diabetes mellitus type 2, A1c 7.3 - Closely monitor blood glucose.  Currently on regular diet.  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   PT Follow up Recs:   Subjective:  Seen at bedside, during my visit did not have any pain but tells me he has sharp right flank pain comes every few minutes lasting for about 10 to 15 seconds each time.  This has been ongoing  for several months.  Examination:  General exam: Appears calm and comfortable  Respiratory system: Clear to auscultation. Respiratory effort normal. Cardiovascular system: S1 & S2 heard, RRR. No JVD, murmurs, rubs, gallops or clicks. No pedal edema. Gastrointestinal system: Abdomen is nondistended, soft and nontender. No organomegaly or masses felt. Normal bowel sounds heard. Central nervous system: Alert and oriented. No focal neurological deficits. Extremities: Symmetric 5 x 5 power. Skin: No rashes, lesions or ulcers Psychiatry: Judgement and insight appear normal. Mood & affect appropriate.

## 2023-12-17 NOTE — Progress Notes (Signed)
 [facility] Progress Note  CC:  Cirrhosis, fever, previous biliary obstruction   Subjective:  Temps running 99s here.  Reports having right back/flank pain.  Had a normal BM.  Eating well.  No nausea.  Objective:  Vital signs in last 24 hours: Temp:  [97.7 F (36.5 C)-99.7 F (37.6 C)] 99.1 F (37.3 C) (09/24 0804) Pulse Rate:  [63-123] 74 (09/24 0804) Resp:  [14-17] 16 (09/23 2322) BP: (93-141)/(67-96) 111/75 (09/24 0804) SpO2:  [79 %-100 %] 96 % (09/24 0804) Last BM Date : 12/15/23 (as per pt.) General:  Alert, Well-developed, in NAD Heart:  Regular rate and rhythm; no murmurs Pulm:  CTAB.  No W/R/R. Abdomen:  Soft, non-distended.  BS present.  Non-tender. Extremities:  Without edema. Neurologic:  Alert and oriented x 4;  grossly normal neurologically. Psych:  Alert and cooperative. Normal mood and affect.  Intake/Output from previous day: 09/23 0701 - 09/24 0700 In: 680 [P.O.:480; IV Piggyback:200] Out: -   Lab Results: Recent Labs    12/15/23 1236 12/15/23 1256 12/16/23 1002 12/17/23 0406  WBC 6.0  --  5.4 3.6*  HGB 10.4* 10.5* 11.0* 10.3*  HCT 32.2* 31.0* 34.9* 31.6*  PLT 38*  --  40* 34*   BMET Recent Labs    12/15/23 1236 12/15/23 1256 12/16/23 1002 12/17/23 0406  NA 132* 135 131* 134*  K 3.9 4.0 3.8 3.7  CL 103 103 100 102  CO2 18*  --  21* 25  GLUCOSE 99 103* 309* 145*  BUN 14 14 12 10   CREATININE 0.99 0.90 1.05 1.01  CALCIUM 8.2*  --  8.3* 7.9*   LFT Recent Labs    12/17/23 0406  PROT 6.1*  ALBUMIN 2.2*  AST 19  ALT 31  ALKPHOS 458*  BILITOT 1.5*   PT/INR Recent Labs    12/15/23 1236 12/16/23 1002  LABPROT 14.8 14.9  INR 1.1 1.1    US  Abdomen Limited RUQ (LIVER/GB) Result Date: 12/16/2023 CLINICAL DATA:  151471 RUQ pain 151471 EXAM: ULTRASOUND ABDOMEN LIMITED RIGHT UPPER QUADRANT COMPARISON:  12/15/2023 FINDINGS: Gallbladder: Layering biliary sludge. No radiopaque stones. No wall thickening or  pericholecystic fluid. No sonographic Murphy's sign noted by sonographer. Common bile duct: Diameter: 5 mm, upstream. The metallic biliary duct stent on the prior CT is not well visualized or evaluated. Liver: Normal echogenicity. No focal lesion identified. Similar diffuse intrahepatic biliary ductal dilation. Portal vein is patent on color Doppler imaging with normal direction of blood flow towards the liver. Other: None. IMPRESSION: Small volume layering biliary sludge. No cholecystolithiasis or changes of acute cholecystitis. Electronically Signed   By: Rogelia Myers M.D.   On: 12/16/2023 15:24   CT ABDOMEN PELVIS W CONTRAST Result Date: 12/15/2023 CLINICAL DATA:  Sepsis.  History of cirrhosis EXAM: CT ABDOMEN AND PELVIS WITH CONTRAST TECHNIQUE: Multidetector CT imaging of the abdomen and pelvis was performed using the standard protocol following bolus administration of intravenous contrast. RADIATION DOSE REDUCTION: This exam was performed according to the departmental dose-optimization program which includes automated exposure control, adjustment of the mA and/or kV according to patient size and/or use of iterative reconstruction technique. CONTRAST:  75mL OMNIPAQUE  IOHEXOL  350 MG/ML SOLN COMPARISON:  Multiple prior imaging studies. The most recent CT scan is 06/17/2023 and the most recent MRI is 09/18/2023 FINDINGS: Lower chest: Streaky subpleural atelectasis. No pleural effusions or pericardial effusion. No pulmonary infiltrates or pulmonary nodules. Hepatobiliary: Metallic common bile duct stent is in place and new since the  prior CT scan. Associated not unexpected pneumobilia. No worrisome hepatic lesions. Stable cirrhotic changes. Again noted is cavernous transformation of the portal vein. Stable portal venous hypertension with portal venous collaterals and esophageal varices. Pancreas: Stable changes of chronic calcific pancreatitis with marked pancreatic atrophy. No acute inflammation. Spleen:  Stable massive splenomegaly. 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 Adrenals/Urinary Tract: The adrenal glands and kidneys are unremarkable and stable. Simple bilateral renal cysts. No worrisome renal lesions or hydronephrosis. The bladder is unremarkable. Stomach/Bowel: The stomach, duodenum, small bowel and colon are grossly normal without oral contrast. No inflammatory changes, mass lesions or obstructive findings. Vascular/Lymphatic: Stable atherosclerotic calcifications involving the aorta and iliac arteries. No aneurysm dissection. The major venous structures are patent except for chronic portal vein thrombosis. Reproductive: The prostate gland and seminal vesicles are unremarkable. Other: Mild chronic mesenteric edema. Small amount of free pelvic fluid noted no overt ascites. Musculoskeletal: No significant bony findings. IMPRESSION: 1. Stable cirrhotic changes and portal venous hypertension with portal venous collaterals and esophageal varices. 2. 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 3. Metallic common bile duct stent in place with not unexpected pneumobilia. 4. Stable changes of chronic calcific pancreatitis with marked pancreatic atrophy. No acute inflammation. Aortic Atherosclerosis (ICD10-I70.0). Electronically Signed   By: MYRTIS Stammer M.D.   On: 12/15/2023 16:31   DG Chest Port 1 View Result Date: 12/15/2023 EXAM: 1 VIEW XRAY OF THE CHEST 12/15/2023 01:02:00 PM COMPARISON: 06/16/2023 CLINICAL HISTORY: Questionable sepsis - evaluate for abnormality. Questionable sepsis; hx of pancreatitis, hypertension. FINDINGS: LINES, TUBES AND DEVICES: EKG leads noted. LUNGS AND PLEURA: No focal pulmonary opacity. No pulmonary edema. No pleural effusion. No pneumothorax. Skin fold over the upper right hemithorax. HEART AND MEDIASTINUM: No acute abnormality of the cardiac and mediastinal silhouettes. Mild left hemidiaphragm elevation. BONES  AND SOFT TISSUES: No acute osseous abnormality. IMPRESSION: 1. No acute cardiopulmonary abnormality. Electronically signed by: Rockey Kilts MD 12/15/2023 01:36 PM EDT RP Workstation: [id number]   Assessment / Plan: *Fever for the past 3 weeks, initially intermittent and now regularly the past several days.  Temp 106 at the highest.  Having some right back/flank pain and occasional left sided abdominal pain. 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  Has been seen by the [facility] liver clinic. *Biliary strictures and recurrent choledocholithiasis/cholangitis status post multiple ERCPs, the last ERCP 11/21/2023 with biliary stent exchange, stone extraction, and brushings (brushing showed atypical cells).  Alk phos still elevated, but downtrending significantly since his stent exchange at the end of August.  Total bili and ALP down further even today. *Chronic calcific pancreatitis with atropy from alcohol, appears stable *New multiple peripheral splenic infarcts seen on CT scan *Thrombocytopenia with platelet count of 34K secondary to his cirrhosis   -Most of his GI issues appear stable to improved.  The only new finding is the multiple peripheral splenic infarcts. -Full infectious work-up for fever.  Blood cultures pending. -Trend labs/LFTs. -No sufficient evidence to warrant repeat ERCP at this time.   LOS: 1 day   Harlene BIRCH. Chon Buhl  12/17/2023, 11:54 AM

## 2023-12-17 NOTE — Discharge Instructions (Addendum)
 [place]  [facility] Address: [address] [place], [place] [address] Phone Number: [phone] Hours of Operation: Residents of [place] can come to obtain food Monday through Friday from 8:30am until 3:30pm. Photo ID and Social Security cards required for all residents of a household. Can come six times a year  [facility] Address: [address] [place], [place] [address] Phone Number: [phone] Hours of Operation: Operates Tuesday-Friday 10:00 a.m. to 1 p.m. Requirements: Referral from DSS needed. May come 6 times a year, 30 days apart. Photo ID and SS required for all residents of household.  [facility] Address: [address] [place], [place] [address] Phone Number: [phone] Hours of Operation: Food pantry is open on the last Saturday of each month from 10:00 am - 12:00 noon. No appointment needed. No qualifications.  [facility] Address: [address] [place], [place] [address] Phone Number: [phone] EXT. [phone] Hours of Operation: Must make reservations to pick up food on Saturdays. Sign ups for Saturday pick up beginning at 8:30 a.m. on Monday morning.  [facility] Address: [address] [place], [place] [address] Phone Number: [phone] Hours of Operation: If you need food, bring proper identification such as a driver's license to receive a bag of food once a month. Requirements: Can come once every 30 days with referral DSS, Holiday representative, Mental health etc. Each referral good for six visits. Photo ID required. *1st visit no referral required.  [facility] Address: [address] [place], [place] [address] Phone Number: [phone]  [place] [facility] Address: [address] [place], [place] [address] Phone Number: [phone] Hours of Operation:  You can register at [email or web address]/ for free groceries  [facility]  [facility] Address: [address] [place], [place] [address] Phone Number: [phone] Hours of Operation: Drive through giveaway, first come first served. Every 3rd Saturday 11AM - 1PM  [facility] [address] Address: [address] [place] [address] Phone Number: [phone]   [place]  [facility] Address: [address] [place], [place] [address] Phone Number: [phone] Hours of Operation: Once a month every 3rd Saturday  [facility] Address: [address] [place], [place] [address] Phone Number: [phone] Hours of Operation: Distribution happens from 9:00-10:00 a.m. every Saturday.     [facility] Address: [address] [place], [place] [address] Phone Number: [phone] Hours of Operation: ONCE a week for the community food distribution held every Tuesday, Wednesday and Thursday from 11 a.m. - 2:00 p.m. Food is available on a first come, first serve basis and varies week to week. No appointment necessary for drive thru pick up.  [facility] Address: [address] [place], [place] [address] Phone Number: [phone] Hours of Operation: Open every 3rd Thursday 9:30 a.m. - 11:00 a.m.  [facility] Address: [address] [place], [place] [address] Phone Number: [phone] Hours of Operation: Please call for hours, directions, and questions  [facility] [place] [facility] Address: [address], [place], [place]  [address] Phone Number: [phone] Website: https://www.Hollyguns.co.za Food Finder app: https://findfood.ghpfa.org  [facility] Address: [address] [place], [place] [address] Phone Number: [phone] Hours of Operation: Contact Bree Harpe. Enrolled Substance Abuse Clients Only  [facility] Address: [address] [place], [address]  Phone Number: [phone] Hours of Operation: Contact Bartley Irving. Food pantry open the 3rd Saturday of each month  from 9 a.m. -12 p.m. only  [facility] [facility] Address: [address] [place], [place] [address] Phone Number: [phone] Hours of Operation: Contact Geni Lee. Emergency food bank open on Saturdays by appointment only  [facility] Address: [address] [place], [place] [address] Phone Number: [phone] Hours of Operation: No specific contact person; Anyone can help  [facility] Address: [address] [place], [place] [address] Phone Number: [phone] Hours of Operation: Contact Medford Molt. Agency gives out a bag of food every Thursday from 2-4 p.m. only, and also provides a community meal every Thursday between 5-6 p.m. Other services provided include rent/mortgage and utility assistance, women's winter shelter, thrift store, and senior adult activities.  [facility] [place] Address: [address] [place], [place] [address] Phone Number: [phone] Hours of Operation: The Emergency Food Assistance Program provides individuals and families with a generous supply of food including meat, fresh vegetables, and nonperishable items. The food box contains five days' worth of food, and each family or individual can receive a box once per month. M, W, Th, Fr 11am-2pm, walk-ins welcome.  [facility] Address: [address] [place], [place] [address]  Phone Number: [phone] Hours of Operation: Contact Asia Cathlean. Tuesdays and Thursdays from 11am - 3pm by appointment only   Counseling Resources:  [facility] [address], [address]  [place], [place] [address]        [phone]     [facility]  [phone] [address] [place]  [place], [place] [address] [phone]  [facility] [address], [place], [place]  [phone]    [facility] [address] [place], [place] [phone]  [facility]  [facility]:  [phone] [address] [place]  [facility]:  [address] [place], [place] [phone]  *[facility] (under & uninsured) [address]   [place] [phone]    [email or web address]    [facility] [place] -[address]     Phone:  [phone] [facility]-  [address]  [phone]    [facility] [address] [place], [place] [phone]  *[facility]: [address] [place], [place], [place]   [phone]   [facility] (Spanish therapist) [address] [place]  [address] [place] [address] [phone]    [facility]  [phone]  [address],  [place], [place]    Hope [phone] [place]   [facility]  [address] [place], [place]        [phone]  [facility] In [place], [address] [place] [phone]):   Mon - Fri from 8 AM - 3 PM  [facility] of the [address], [address] , [place]: [phone]) 8:30 - 12; 1 - 2:30 Accepts Medicaid   [facility] of [facility] [employer], [address] [place], [place] [phone]):8:30 - 12; 2 - 3PM  [facility] [employer], [address], [place]; [phone]):   Mon - Fri 8 AM - 5 PM  *[facility] [address] [place]  MWF 12:30 to 3:00 or  call to schedule an appointment [phone]  Specific Provider options Psychology Today  https://www.psychologytoday.com/us  click on find a  therapist  enter your zip code left side and select or tailor a therapist for your specific need.   [facility] Provider Directory [email or web address]  (Medicaid)  Follow all drop down to find a provider  Social Support program Mental Health [place] [phone] or  PhotoS[employer].pl [address], [place], [place] Recovery support and educational  In home counseling [facility] Telephone: [phone]  office in [facility] [email or web address]   private insurance [employer], [place] health Choice, [place], [place], [place], [place], [place] Health Choice   24- Hour Availability:  [facility]  [phone] or [phone]  [facility] Service of the [facility] [phone]  [facility] Crisis Service  [phone]   [facility]  [phone] (after hours)  Therapeutic Alternative/Mobile Crisis   [phone]  [place]  National Suicide Hotline  208-279-9150 MERRILYN)  Call 911 or go to emergency room  [facility]  [phone]);  [place] and [employer]  [phone]); [place], [place], [place], [place], [place], [place], [place]

## 2023-12-17 NOTE — Progress Notes (Signed)
 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  Patient presents with fevers and bilateral flank pain.  [facility] consulted.  ERCP on 11/21/2023 for stent exchange, stone extraction and biliary stent was placed.  Currently recommending conservative management.  Assessment & Plan:  Fever/chills with abdominal pain - Prior complicated history of hepatobiliary tree/cirrhosis.  Concerns of possible cholangitis given prior history and ERCP in the past.  Continue broad-spectrum antibiotics and monitor culture data.  LFTs are relatively normal, elevated ALP. 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  Continue PPI IV twice daily.  Will transition to p.o. as appropriate  Chronic pancreatitis - Continue Creon   COPD - As needed bronchodilators  Diabetes mellitus type 2, A1c 7.3 - Closely monitor blood glucose.  Currently on regular diet.  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   PT Follow up Recs:   Subjective:  Seen at bedside, during my visit did not have any pain but tells me he has  sharp right flank pain comes every few minutes lasting for about 10 to 15 seconds each time.  This has been ongoing for several months.  Examination:  General exam: Appears calm and comfortable  Respiratory system: Clear to auscultation. Respiratory effort normal. Cardiovascular system: S1 & S2 heard, RRR. No JVD, murmurs, rubs, gallops or clicks. No pedal edema. Gastrointestinal system: Abdomen is nondistended, soft and nontender. No organomegaly or masses felt. Normal bowel sounds heard. Central nervous system: Alert and oriented. No focal neurological deficits. Extremities: Symmetric 5 x 5 power. Skin: No rashes, lesions or ulcers Psychiatry: Judgement and insight appear normal. Mood & affect appropriate.                Diet Orders (From admission, onward)     Start     Ordered   12/16/23 1625  Diet regular Room service appropriate? Yes; Fluid consistency: Thin  Diet effective now       Question Answer Comment  Room service appropriate? Yes   Fluid consistency: Thin      12/16/23 1626            Objective: Vitals:   12/16/23 2322 12/17/23 0444 12/17/23 0453 12/17/23 0804  BP: 101/75 120/88 120/88 111/75  Pulse: 63 83 87 74  Resp: 16     Temp: 98.3 F (36.8 C) 99.4 F (37.4 C) 99.4 F (37.4 C) 99.1 F (37.3 C)  TempSrc:  Oral Oral Oral  SpO2: 97% 98% (!) 79% 96%  Weight:      Height:        Intake/Output Summary (Last 24 hours) at 12/17/2023 1113 Last data filed at 12/17/2023 0700 Gross per 24 hour  Intake 680 ml  Output --  Net 680 ml   Filed Weights   12/16/23 0956  Weight: 55.8 kg    Scheduled Meds:  FLUoxetine   20 mg Oral Daily   fluticasone  furoate-vilanterol  1 puff Inhalation Daily   lipase/protease/amylase  36,000 Units Oral With snacks   lipase/protease/amylase  72,000 Units Oral TID with meals   pantoprazole  (PROTONIX ) IV  40 mg Intravenous Q12H   Continuous Infusions:  ceFEPime  (MAXIPIME ) IV 2 g (12/17/23 0454)   lactated  ringers  75 mL/hr at 12/16/23 1737   metronidazole  500 mg (12/17/23 9386)   vancomycin  1,250 mg (12/17/23 0924)    Nutritional status     Body mass index is 18.43 kg/m.  Data Reviewed:   CBC: Recent Labs  Lab 12/15/23 1236 12/15/23 1256 12/16/23 1002 12/17/23 0406  WBC 6.0  --  5.4 3.6*  NEUTROABS 5.4  --  4.3  --   HGB 10.4* 10.5* 11.0* 10.3*  HCT 32.2* 31.0* 34.9* 31.6*  MCV 89.0  --  90.2 87.5  PLT 38*  --  40* 34*   Basic Metabolic Panel: Recent Labs  Lab 12/15/23 1236 12/15/23 1256 12/16/23 1002 12/16/23 1731 12/17/23 0406  NA 132* 135 131*  --  134*  K 3.9 4.0 3.8  --  3.7  CL 103 103 100  --  102  CO2 18*  --  21*  --  25  GLUCOSE 99 103* 309*  --  145*  BUN 14 14 12   --  10  CREATININE 0.99 0.90 1.05  --  1.01  CALCIUM 8.2*  --  8.3*  --  7.9*  MG  --   --   --  1.8 1.9   GFR: Estimated Creatinine Clearance: 67.5 mL/min (by C-G formula based on SCr of 1.01 mg/dL). Liver Function Tests: Recent Labs  Lab 12/15/23 1236 12/16/23 1002 12/17/23 0406  AST 31 26 19   ALT 43 36 31  ALKPHOS 475* 488* 458*  BILITOT 2.7* 1.7* 1.5*  PROT 6.6 6.8 6.1*  ALBUMIN 2.5* 2.4* 2.2*   Recent Labs  Lab 12/15/23 1236 12/16/23 1731  LIPASE <10* <10*   No results for input(s): AMMONIA in the last 168 hours. Coagulation Profile: Recent Labs  Lab 12/15/23 1236 12/16/23 1002  INR 1.1 1.1   Cardiac Enzymes: No results for input(s): CKTOTAL, CKMB, CKMBINDEX, TROPONINI in the last 168 hours. BNP (last 3 results) No results for input(s): PROBNP in the last 8760 hours. HbA1C: Recent Labs    12/16/23 1731  HGBA1C 7.3*   CBG: Recent Labs  Lab 12/16/23 2100 12/17/23 0152 12/17/23 0805  GLUCAP 173* 159* 209*   Lipid Profile: No results for input(s): CHOL, HDL, LDLCALC, TRIG, CHOLHDL, LDLDIRECT in the last 72 hours. Thyroid Function Tests: No results for input(s): TSH, T4TOTAL, FREET4, T3FREE, THYROIDAB in the last 72  hours. Anemia Panel: No results for input(s): VITAMINB12, FOLATE, FERRITIN, TIBC, IRON, RETICCTPCT in the last 72 hours. Sepsis Labs: Recent Labs  Lab 12/15/23 1319 12/16/23 1102 12/16/23 1409  LATICACIDVEN 1.6 2.2* 1.2    Recent Results (from the past 240 hours)  Resp panel by RT-PCR (RSV, Flu A&B, Covid) Anterior Nasal Swab     Status: None   Collection Time: 12/15/23 12:37 PM   Specimen: Anterior Nasal Swab  Result Value Ref Range Status   SARS Coronavirus 2 by RT PCR NEGATIVE NEGATIVE Final   Influenza A by PCR NEGATIVE NEGATIVE Final   Influenza B by PCR NEGATIVE NEGATIVE Final    Comment: (NOTE)  The Xpert Xpress SARS-CoV-2/FLU/RSV plus assay is intended as an aid in the diagnosis of influenza from Nasopharyngeal swab specimens and should not be used as a sole basis for treatment. Nasal washings and aspirates are unacceptable for Xpert Xpress SARS-CoV-2/FLU/RSV testing.  Fact Sheet for Patients: BloggerCourse.com  Fact Sheet for Healthcare Providers: SeriousBroker.it  This test is not yet approved or cleared by the [place]  FDA and has been authorized for detection and/or diagnosis of SARS-CoV-2 by FDA under an Emergency Use Authorization (EUA). This EUA will remain in effect (meaning this test can be used) for the duration of the COVID-19 declaration under Section 564(b)(1) of the Act, 21 U.S.C. section 360bbb-3(b)(1), unless the authorization is terminated or revoked.     Resp Syncytial Virus by PCR NEGATIVE NEGATIVE Final    Comment: (NOTE) Fact Sheet for Patients: BloggerCourse.com  Fact Sheet for Healthcare Providers: SeriousBroker.it  This test is not yet approved or cleared by the [place]  FDA and has been authorized for detection and/or diagnosis of SARS-CoV-2 by FDA under an Emergency Use Authorization (EUA). This EUA will remain in  effect (meaning this test can be used) for the duration of the COVID-19 declaration under Section 564(b)(1) of the Act, 21 U.S.C. section 360bbb-3(b)(1), unless the authorization is terminated or revoked.  Performed at [facility], [address], [place], [place] [address]   Blood culture (routine x 2)     Status: None (Preliminary result)   Collection Time: 12/15/23 12:45 PM   Specimen: BLOOD LEFT FOREARM  Result Value Ref Range Status   Specimen Description BLOOD LEFT FOREARM  Final   Special Requests   Final    BOTTLES DRAWN AEROBIC AND ANAEROBIC Blood Culture adequate volume   Culture   Final    NO GROWTH 2 DAYS Performed at [facility], [address], [place], [place] [address]    Report Status PENDING  Incomplete  Blood culture (routine x 2)     Status: None (Preliminary result)   Collection Time: 12/15/23  1:08 PM   Specimen: BLOOD  Result Value Ref Range Status   Specimen Description BLOOD SITE NOT SPECIFIED  Final   Special Requests   Final    BOTTLES DRAWN AEROBIC AND ANAEROBIC Blood Culture adequate volume   Culture   Final    NO GROWTH 2 DAYS Performed at [facility], [address], [place], [place] [address]    Report Status PENDING  Incomplete         Radiology Studies: US  Abdomen Limited RUQ (LIVER/GB) Result Date: 12/16/2023 CLINICAL DATA:  151471 RUQ pain 151471 EXAM: ULTRASOUND ABDOMEN LIMITED RIGHT UPPER QUADRANT COMPARISON:  12/15/2023 FINDINGS: Gallbladder: Layering biliary sludge. No radiopaque stones. No wall thickening or pericholecystic fluid. No sonographic Murphy's sign noted by sonographer. Common bile duct: Diameter: 5 mm, upstream. The metallic biliary duct stent on the prior CT is not well visualized or evaluated. Liver: Normal echogenicity. No focal lesion identified. Similar diffuse intrahepatic biliary ductal dilation. Portal vein is patent on color Doppler imaging with normal direction of blood flow towards the liver.  Other: None. IMPRESSION: Small volume layering biliary sludge. No cholecystolithiasis or changes of acute cholecystitis. Electronically Signed   By: Rogelia Myers M.D.   On: 12/16/2023 15:24   CT ABDOMEN PELVIS W CONTRAST Result Date: 12/15/2023 CLINICAL DATA:  Sepsis.  History of cirrhosis EXAM: CT ABDOMEN AND PELVIS WITH CONTRAST TECHNIQUE: Multidetector CT imaging of the abdomen and pelvis was performed  using the standard protocol following bolus administration of intravenous contrast. RADIATION DOSE REDUCTION: This exam was performed according to the departmental dose-optimization program which includes automated exposure control, adjustment of the mA and/or kV according to patient size and/or use of iterative reconstruction technique. CONTRAST:  75mL OMNIPAQUE  IOHEXOL  350 MG/ML SOLN COMPARISON:  Multiple prior imaging studies. The most recent CT scan is 06/17/2023 and the most recent MRI is 09/18/2023 FINDINGS: Lower chest: Streaky subpleural atelectasis. No pleural effusions or pericardial effusion. No pulmonary infiltrates or pulmonary nodules. Hepatobiliary: Metallic common bile duct stent is in place and new since the prior CT scan. Associated not unexpected pneumobilia. No worrisome hepatic lesions. Stable cirrhotic changes. Again noted is cavernous transformation of the portal vein. Stable portal venous hypertension with portal venous collaterals and esophageal varices. Pancreas: Stable changes of chronic calcific pancreatitis with marked pancreatic atrophy. No acute inflammation. Spleen: Stable massive splenomegaly. 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 Adrenals/Urinary Tract: The adrenal glands and kidneys are unremarkable and stable. Simple bilateral renal cysts. No worrisome renal lesions or hydronephrosis. The bladder is unremarkable. Stomach/Bowel: The stomach, duodenum, small bowel and colon are grossly normal without oral contrast. No inflammatory changes, mass  lesions or obstructive findings. Vascular/Lymphatic: Stable atherosclerotic calcifications involving the aorta and iliac arteries. No aneurysm dissection. The major venous structures are patent except for chronic portal vein thrombosis. Reproductive: The prostate gland and seminal vesicles are unremarkable. Other: Mild chronic mesenteric edema. Small amount of free pelvic fluid noted no overt ascites. Musculoskeletal: No significant bony findings. IMPRESSION: 1. Stable cirrhotic changes and portal venous hypertension with portal venous collaterals and esophageal varices. 2. 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 3. Metallic common bile duct stent in place with not unexpected pneumobilia. 4. Stable changes of chronic calcific pancreatitis with marked pancreatic atrophy. No acute inflammation. Aortic Atherosclerosis (ICD10-I70.0). Electronically Signed   By: MYRTIS Stammer M.D.   On: 12/15/2023 16:31   DG Chest Port 1 View Result Date: 12/15/2023 EXAM: 1 VIEW XRAY OF THE CHEST 12/15/2023 01:02:00 PM COMPARISON: 06/16/2023 CLINICAL HISTORY: Questionable sepsis - evaluate for abnormality. Questionable sepsis; hx of pancreatitis, hypertension. FINDINGS: LINES, TUBES AND DEVICES: EKG leads noted. LUNGS AND PLEURA: No focal pulmonary opacity. No pulmonary edema. No pleural effusion. No pneumothorax. Skin fold over the upper right hemithorax. HEART AND MEDIASTINUM: No acute abnormality of the cardiac and mediastinal silhouettes. Mild left hemidiaphragm elevation. BONES AND SOFT TISSUES: No acute osseous abnormality. IMPRESSION: 1. No acute cardiopulmonary abnormality. Electronically signed by: Rockey Kilts MD 12/15/2023 01:36 PM EDT RP Workstation: [id number]           LOS: 1 day   Time spent= 35 mins    Burgess JAYSON Dare, MD [facility]  If 7PM-7AM, please contact night-coverage  12/17/2023, 11:13 AM

## 2023-12-17 NOTE — TOC Initial Note (Addendum)
 Transition of Care [facility]) - Initial/Assessment Note    Patient Details  Name: Lawrence Brock MRN: [id number] Date of Birth: [birth year]  [facility]) CM/SW Contact:    Lendia Dais, LCSWA Phone Number: 12/17/2023, 1:09 PM  Clinical Narrative:  Pt is from home with 3 children, 2 adult children and a 53 y.o. HCPOA is eldest daughter Caryl. Pt is independent with ADL's and only uses a dexcom for diabetes. Pt works full-time and is able to afford most of his need. Pt stated that he has gone without food at times. Food resources in AVS.   Pt is able to drive to appointments. Pt had mentioned he had trouble with afford his medicine for appendicitis. CSW asked if the insurance covered it and the pt stated they did, they were worried about how Brock they would cover it. CSW encourage the patient to call their insurance company to inquire about it, pt was agreeable.   Pt has a hx of anxiety and depression and currently received medication for both. Pt has a hx of alcohol abuse but has not had a drink in 15-16 years. CSW asked if they would like MH resources and the pt agreed, resources in AVS. Pt plans to return home upon discharge.                 Expected Discharge Plan: Home/Self Care     Patient Goals and CMS Choice Patient states their goals for this hospitalization and ongoing recovery are:: Go back home, live, and have peace.   Choice offered to / list presented to : NA      Expected Discharge Plan and Services In-house Referral: Clinical Social Work     Living arrangements for the past 2 months: Single Family Home                                      Prior Living Arrangements/Services Living arrangements for the past 2 months: Single Family Home Lives with:: Adult Children, Minor Children Patient language and need for interpreter reviewed:: Yes Do you feel safe going back to the place where you live?: Yes      Need for Family Participation in Patient Care:  No (Comment) Care giver support system in place?: No (comment) Current home services: DME Criminal Activity/Legal Involvement Pertinent to Current Situation/Hospitalization: No - Comment as needed  Activities of Daily Living   ADL Screening (condition at time of admission) Independently performs ADLs?: Yes (appropriate for developmental age) Is the patient deaf or have difficulty hearing?: No Does the patient have difficulty seeing, even when wearing glasses/contacts?: No Does the patient have difficulty concentrating, remembering, or making decisions?: No  Permission Sought/Granted Permission sought to share information with : Family Supports Permission granted to share information with : Yes, Verbal Permission Granted  Share Information with NAME: Caryl     Permission granted to share info w Relationship: Daughter  Permission granted to share info w Contact Information: [phone]  Emotional Assessment Appearance:: Appears stated age Attitude/Demeanor/Rapport: Engaged Affect (typically observed): Appropriate, Pleasant Orientation: : Oriented to Self, Oriented to Place, Oriented to  Time, Oriented to Situation Alcohol / Substance Use: Alcohol Use Psych Involvement: No (comment)  Admission diagnosis:  Fever and chills [R50.9] Splenic infarct [D73.5] Patient Active Problem List   Diagnosis Date Noted   Fever and chills 12/16/2023   Hyperbilirubinemia 04/07/2023   Transaminitis 04/07/2023   Abdominal  pain 04/06/2023   Medication management 01/07/2023   Hyponatremia 12/24/2022   Chronic obstructive pulmonary disease (HCC) 12/18/2022   Gastroesophageal reflux disease 12/18/2022   Type 2 diabetes mellitus without complication, without Brock-term current use of insulin  (HCC) 12/18/2022   Constipation 12/18/2022   Sepsis (HCC) 12/12/2022   Portal hypertensive gastropathy (HCC) 05/12/2022   Hypotension 05/11/2022   Internal hemorrhoid 05/11/2022   Acute GI bleeding 05/11/2022    Acute upper GI bleeding 05/10/2022   Syncope 05/10/2022   Abnormal finding on GI tract imaging 05/10/2022   Hematochezia 05/10/2022   Acute blood loss anemia 05/10/2022   Lactic acidosis 05/10/2022   Lower abdominal pain 05/10/2022   Chest pain 05/10/2022   Alcohol abuse, in remission 05/10/2022   Tobacco abuse 05/10/2022   Alcoholism in remission (HCC) 02/01/2022   Splenomegaly 05/30/2021   History of esophageal varices 08/13/2019   Hematemesis with nausea 08/19/2016   Bleeding esophageal varices (HCC)    Anemia, iron deficiency    Anemia 06/26/2015   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/03/2015   Bleeding gastrointestinal 12/29/2014   Alcoholic cirrhosis of liver without ascites (HCC) 12/29/2014   Portal vein thrombosis 12/29/2014   CAP (community acquired pneumonia) 10/31/2014   Pancytopenia (HCC) 10/31/2014   Pancreatitis 10/31/2014   Cholangitis    Biliary obstruction 02/22/2014   Common biliary duct stricture 02/22/2014   Common bile duct stricture 02/18/2014   Choledocholithiasis 02/18/2014   Hepatic abscess 02/16/2014   Elevated LFTs 02/15/2014   Other specified fever 02/15/2014   Duodenal ulcer 09/14/2013   Fever 09/10/2013   Chronic pancreatitis (HCC) 09/10/2013   Hypokalemia 09/09/2013   Thrombocytopenia, unspecified 09/09/2013   Normocytic anemia 09/09/2013   Pancreatic pseudocyst 09/09/2013   PCP:  [name], MD Pharmacy:   [facility] [address], [place] - [address] [place] [address] Phone: [phone] Fax: [phone]  Lawrence Brock - [facility] [address] [place] [address] Phone: [phone] Fax: [phone]     Social Drivers of Health (SDOH) Social History: SDOH Screenings   Food Insecurity: No Food Insecurity (12/17/2023)  Housing: Low Risk  (12/17/2023)  Transportation Needs: No Transportation Needs (12/17/2023)  Utilities: Not At Risk  (12/17/2023)  Depression (PHQ2-9): Low Risk  (03/05/2023)  Financial Resource Strain: Not on File (08/31/2018)   Received from [facility]  Physical Activity: Not on File (08/31/2018)   Received from [facility]  Social Connections: Moderately Isolated (04/07/2023)  Stress: Not on File (08/31/2018)   Received from [facility]  Tobacco Use: High Risk (12/16/2023)   SDOH Interventions:     Readmission Risk Interventions    04/07/2023    3:54 PM  Readmission Risk Prevention Plan  Transportation Screening Complete  PCP or Specialist Appt within 5-7 Days Complete  Medication Review (RN CM) Referral to Pharmacy

## 2023-12-18 ENCOUNTER — Other Ambulatory Visit (HOSPITAL_COMMUNITY): Payer: Self-pay

## 2023-12-18 DIAGNOSIS — R509 Fever, unspecified: Secondary | ICD-10-CM | POA: Diagnosis not present

## 2023-12-18 LAB — COMPREHENSIVE METABOLIC PANEL WITH GFR
ALT: 27 U/L (ref 0–44)
AST: 21 U/L (ref 15–41)
Albumin: 2.1 g/dL — ABNORMAL LOW (ref 3.5–5.0)
Alkaline Phosphatase: 546 U/L — ABNORMAL HIGH (ref 38–126)
Anion gap: 10 (ref 5–15)
BUN: 11 mg/dL (ref 6–20)
CO2: 21 mmol/L — ABNORMAL LOW (ref 22–32)
Calcium: 8 mg/dL — ABNORMAL LOW (ref 8.9–10.3)
Chloride: 105 mmol/L (ref 98–111)
Creatinine, Ser: 0.82 mg/dL (ref 0.61–1.24)
GFR, Estimated: >60 mL/min (ref >60)
Glucose, Bld: 148 mg/dL — ABNORMAL HIGH (ref 70–99)
Potassium: 3.9 mmol/L (ref 3.5–5.1)
Sodium: 136 mmol/L (ref 135–145)
Total Bilirubin: 1.3 mg/dL — ABNORMAL HIGH (ref 0.0–1.2)
Total Protein: 6.3 g/dL — ABNORMAL LOW (ref 6.5–8.1)

## 2023-12-18 LAB — CBC
HCT: 32.6 % — ABNORMAL LOW (ref 39.0–52.0)
Hemoglobin: 10.5 g/dL — ABNORMAL LOW (ref 13.0–17.0)
MCH: 28.2 pg (ref 26.0–34.0)
MCHC: 32.2 g/dL (ref 30.0–36.0)
MCV: 87.4 fL (ref 80.0–100.0)
Platelets: 34 K/uL — ABNORMAL LOW (ref 150–400)
RBC: 3.73 MIL/uL — ABNORMAL LOW (ref 4.22–5.81)
RDW: 14.5 % (ref 11.5–15.5)
WBC: 3 K/uL — ABNORMAL LOW (ref 4.0–10.5)
nRBC: 0 % (ref 0.0–0.2)

## 2023-12-18 LAB — GLUCOSE, CAPILLARY: Glucose-Capillary: 133 mg/dL — ABNORMAL HIGH (ref 70–99)

## 2023-12-18 LAB — URINE CULTURE: Culture: NO GROWTH

## 2023-12-18 LAB — MAGNESIUM: Magnesium: 1.8 mg/dL (ref 1.7–2.4)

## 2023-12-18 LAB — PHOSPHORUS: Phosphorus: 3.2 mg/dL (ref 2.5–4.6)

## 2023-12-18 MED ORDER — PANTOPRAZOLE SODIUM 40 MG PO TBEC
40.0000 mg | DELAYED_RELEASE_TABLET | Freq: Two times a day (BID) | ORAL | Status: DC
  Administered 2023-12-18: 40 mg via ORAL
  Filled 2023-12-18: qty 1

## 2023-12-18 MED ORDER — OXYCODONE HCL 5 MG PO TABS
5.0000 mg | ORAL_TABLET | Freq: Four times a day (QID) | ORAL | 0 refills | Status: AC | PRN
  Filled 2023-12-18: qty 30, 4d supply, fill #0

## 2023-12-18 MED ORDER — CIPROFLOXACIN HCL 500 MG PO TABS
500.0000 mg | ORAL_TABLET | Freq: Two times a day (BID) | ORAL | Status: DC
  Administered 2023-12-18: 500 mg via ORAL
  Filled 2023-12-18: qty 1

## 2023-12-18 MED ORDER — METRONIDAZOLE 500 MG PO TABS
500.0000 mg | ORAL_TABLET | Freq: Two times a day (BID) | ORAL | 0 refills | Status: AC
  Filled 2023-12-18: qty 12, 6d supply, fill #0

## 2023-12-18 MED ORDER — METRONIDAZOLE 500 MG PO TABS
500.0000 mg | ORAL_TABLET | Freq: Two times a day (BID) | ORAL | Status: DC
  Filled 2023-12-18: qty 1

## 2023-12-18 MED ORDER — CIPROFLOXACIN HCL 500 MG PO TABS
500.0000 mg | ORAL_TABLET | Freq: Two times a day (BID) | ORAL | 0 refills | Status: AC
  Filled 2023-12-18: qty 12, 6d supply, fill #0

## 2023-12-18 MED ORDER — POLYETHYLENE GLYCOL 3350 17 GM/SCOOP PO POWD
17.0000 g | Freq: Every day | ORAL | 0 refills | Status: AC | PRN
  Filled 2023-12-18: qty 238, 14d supply, fill #0

## 2023-12-18 NOTE — Progress Notes (Signed)
 Pt has DC order. AVS given and explained to pt. Pt for pick-up meds at [facility] prior to DC, family to provide transport.

## 2023-12-18 NOTE — TOC Transition Note (Signed)
 Transition of Care [facility]) - Discharge Note   Patient Details  Name: Lawrence Brock MRN: [id number] Date of Birth: 1970-05-01  Transition of Care [facility] CM/SW Contact:  Tom-Johnson, Harvest Muskrat, RN Phone Number: 12/18/2023, 11:56 AM   Clinical Narrative:     Patient is scheduled for discharge today.  Readmission Risk Assessment done. Hospital f/u and discharge instructions on AVS. Prescriptions sent to [facility] pharmacy and patient will receive meds prior discharge. No ICM needs or recommendations noted. Daughter, Caryl to transport at discharge.  No further ICM needs noted.       Final next level of care: Home/Self Care Barriers to Discharge: Barriers Resolved   Patient Goals and CMS Choice Patient states their goals for this hospitalization and ongoing recovery are:: To eturn home CMS Medicare.gov Compare Post Acute Care list provided to:: Patient Choice offered to / list presented to : NA      Discharge Placement                Patient to be transferred to facility by: Daughter Name of family member notified: [facility]    Discharge Plan and Services Additional resources added to the After Visit Summary for   In-house Referral: Clinical Social Work              DME Arranged: N/A DME Agency: NA       HH Arranged: NA HH Agency: NA        Social Drivers of Health (SDOH) Interventions SDOH Screenings   Food Insecurity: No Food Insecurity (12/17/2023)  Housing: Low Risk  (12/17/2023)  Transportation Needs: No Transportation Needs (12/17/2023)  Utilities: Not At Risk (12/17/2023)  Depression (PHQ2-9): Low Risk  (03/05/2023)  Financial Resource Strain: Not on File (08/31/2018)   Received from [facility]  Physical Activity: Not on File (08/31/2018)   Received from [facility]  Social Connections: Moderately Isolated (04/07/2023)  Stress: Not on File (08/31/2018)   Received from [facility]  Tobacco Use: High Risk (12/16/2023)     Readmission Risk Interventions    12/18/2023    11:00 AM 04/07/2023    3:54 PM  Readmission Risk Prevention Plan  Transportation Screening Complete Complete  PCP or Specialist Appt within 5-7 Days  Complete  PCP or Specialist Appt within 3-5 Days Complete   Medication Review (RN CM)  Referral to Pharmacy  HRI or Home Care Consult Complete   Social Work Consult for Recovery Care Planning/Counseling Complete   Palliative Care Screening Not Applicable   Medication Review Oceanographer) Referral to Pharmacy

## 2023-12-18 NOTE — Discharge Summary (Signed)
 Physician Discharge Summary  Lawrence Brock FMW:[id number] DOB: [birth year] DOA: 12/16/2023  PCP: [name], MD  Admit date: 12/16/2023 Discharge date: 12/18/2023  Admitted From: Home Disposition: Home  Recommendations for Outpatient Follow-up:  Follow up with PCP in 1-2 weeks Please obtain CMP/CBC in one week your next doctors visit.  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  Patient presents with fevers and bilateral flank pain.  [facility] consulted.  ERCP on 11/21/2023 for stent exchange, stone extraction and biliary stent was placed.  Currently recommending conservative management.  Assessment & Plan:  Fever/chills with abdominal pain - Prior complicated history of hepatobiliary tree/cirrhosis.  Concerns of possible cholangitis given prior history and ERCP in the past.  Continue broad-spectrum antibiotics and monitor culture data.  LFTs are relatively normal, elevated ALP.  Transition to p.o. antibiotics for 6 more day to complete total 7-day course. - Blood cultures canceled?,  Reordered - CT abdomen pelvis and right upper quadrant ultrasound reviewed-no obvious evidence of acute pathology but does show massive splenomegaly with multiple infarcts  Splenic infarct Splenomegaly - Echo is unremarkable.  Follow-up outpatient GI and hematology.  No further inpatient acute workup indicated  History of alcohol abuse Liver cirrhosis - Currently no evidence of decompensation.  Continue  PPI  Thrombocytopenia - No obvious evidence of bleeding.  This has been chronic dating back as far as 2016.  Seen by hematology at [facility] in June 2025  Chronic pancreatitis - Continue Creon   COPD - As needed bronchodilators  Diabetes mellitus type 2, A1c 7.3 - Closely monitor blood glucose.  Currently on regular diet.  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  This has been going on for several months now.  Examination:  General exam: Appears calm and comfortable  Respiratory system: Clear to auscultation. Respiratory effort normal. Cardiovascular system: S1 & S2 heard, RRR. No JVD, murmurs, rubs, gallops or clicks. No pedal edema. Gastrointestinal system: Abdomen is nondistended, soft and nontender. No organomegaly or masses felt. Normal bowel sounds heard. Central nervous system: Alert and oriented. No focal neurological deficits. Extremities: Symmetric 5 x 5 power. Skin: No rashes, lesions or ulcers Psychiatry: Judgement and insight appear normal. Mood & affect appropriate.    Discharge Diagnoses:  Principal Problem:   Fever and chills      Discharge Exam: Vitals:   12/18/23 0728 12/18/23 0810  BP: 111/80   Pulse: 66 71  Resp:  15  Temp: 97.6 F (36.4 C)   SpO2: 98% 96%   Vitals:   12/17/23 1940 12/18/23 0523 12/18/23 0728 12/18/23 0810  BP: 113/85 108/85 111/80   Pulse: 65 68 66 71  Resp: 18 18  15   Temp: 98 F (36.7 C) 98.3 F (36.8 C) 97.6 F (36.4 C)   TempSrc: Oral Oral Oral  SpO2: 99% 99% 98% 96%  Weight:      Height:          Discharge Instructions   Allergies as of 12/18/2023   No Known Allergies      Medication List      TAKE these medications    acetaminophen  500 MG tablet Commonly known as: TYLENOL  Take 500-1,000 mg by mouth every 6 (six) hours as needed for moderate pain (pain score 4-6).   cetirizine 10 MG tablet Commonly known as: ZYRTEC Take 10 mg by mouth daily.   ciprofloxacin  500 MG tablet Commonly known as: CIPRO  Take 1 tablet (500 mg total) by mouth 2 (two) times daily for 6 days.   Creon  36000-114000 units Cpep capsule Generic drug: lipase/protease/amylase Take 1-2 capsules by mouth See admin instructions. Take 2 capsules by mouth three times a day with meals and 1 capsule with snacks.   FLUoxetine  20 MG capsule Commonly known as: PROZAC  Take 20 mg by mouth daily.   Jardiance  25 MG Tabs tablet Generic drug: empagliflozin  Take 25 mg by mouth daily.   metroNIDAZOLE  500 MG tablet Commonly known as: FLAGYL  Take 1 tablet (500 mg total) by mouth every 12 (twelve) hours for 6 days.   MULTIVITAMIN PO Take 1 tablet by mouth daily.   OVER THE COUNTER MEDICATION Take 1 tablet by mouth daily. Blood Builder Mega Food   oxyCODONE  5 MG immediate release tablet Commonly known as: Oxy IR/ROXICODONE  Take 1-2 tablets (5-10 mg total) by mouth every 6 (six) hours as needed for moderate pain (pain score 4-6) or severe pain (pain score 7-10). What changed:  how much to take reasons to take this   pantoprazole  40 MG tablet Commonly known as: PROTONIX  Take 40 mg by mouth daily.   polyethylene glycol powder 17 GM/SCOOP powder Commonly known as: MiraLax  Take 1 capful (17 g) by mouth daily as needed for moderate constipation or severe constipation. Dissolve 1 capful (17g) in 4-8 ounces of liquid and take by mouth daily.   Symbicort 160-4.5 MCG/ACT inhaler Generic drug: budesonide-formoterol  Inhale 2 puffs into the lungs 2 (two) times daily.   Toujeo Max SoloStar 300 UNIT/ML Solostar Pen Generic drug: insulin  glargine (2 Unit Dial) Inject 14 Units into the skin at bedtime.         Follow-up Information     [name], MD Follow up in 1 week(s).   Specialty: Family Medicine Contact information: [address] [facility] [place] [address] [phone]                No Known Allergies  You were cared for by a hospitalist during your hospital stay. If you have any questions about your discharge medications or the care you received while you were in the hospital after you are discharged, you can call the unit and asked to speak with the hospitalist on call if the hospitalist that took care of you is not available. Once you are discharged, your primary care physician will handle any further medical issues. Please note that no refills for any discharge medications will be authorized once you are discharged, as it is imperative that you return to your primary care physician (or establish a relationship with a primary care physician if you do not have one) for your aftercare needs so that they can reassess your need for medications and monitor your lab values.  You were cared for by a hospitalist during your hospital stay. If you have any questions  about your discharge medications or the care you received while you were in the hospital after you are discharged, you can call the unit and asked to speak with the hospitalist on call if the hospitalist that took care of you is not available. Once you are discharged, your primary care physician will handle any further medical issues. Please note that NO REFILLS for any discharge medications will be authorized once you are discharged, as it is imperative that you return to your primary care physician (or establish a relationship with a primary care physician if you do not have one) for your aftercare needs so that they can reassess your need for medications and monitor your lab values.  Please request your Prim.MD to go over all Hospital Tests and Procedure/Radiological results at the follow up,  please get all Hospital records sent to your Prim MD by signing hospital release before you go home.  Get CBC, CMP, 2 view Chest X ray checked  by Primary MD during your next visit or SNF MD in 5-7 days ( we routinely change or add medications that can affect your baseline labs and fluid status, therefore we recommend that you get the mentioned basic workup next visit with your PCP, your PCP may decide not to get them or add new tests based on their clinical decision)  On your next visit with your primary care physician please Get Medicines reviewed and adjusted.  If you experience worsening of your admission symptoms, develop shortness of breath, life threatening emergency, suicidal or homicidal thoughts you must seek medical attention immediately by calling 911 or calling your MD immediately  if symptoms less severe.  You Must read complete instructions/literature along with all the possible adverse reactions/side effects for all the Medicines you take and that have been prescribed to you. Take any new Medicines after you have completely understood and accpet all the possible adverse reactions/side effects.   Do not drive, operate heavy machinery, perform activities at heights, swimming or participation in water activities or provide baby sitting services if your were admitted for syncope or siezures until you have seen by Primary MD or a Neurologist and advised to do so again.  Do not drive when taking Pain medications.   Procedures/Studies: ECHOCARDIOGRAM COMPLETE Result Date: 12/17/2023    ECHOCARDIOGRAM REPORT   Patient Name:   Lawrence Brock Date of Exam: 12/17/2023 Medical Rec #:  [id number]     Height:       68.5 in Accession #:    [id number]    Weight:       123.0 lb Date of Birth:  March 24, 1971      BSA:          1.671 m Patient Age:    53 years      BP:           120/88 mmHg Patient Gender: M             HR:           62 bpm. Exam Location:  Inpatient Procedure: 2D Echo, Cardiac Doppler, Color  Doppler and 3D Echo (Both Spectral            and Color Flow Doppler were utilized during procedure). Indications:    Subacute bacterial endocarditis;  History:        Patient has no prior history of Echocardiogram examinations.                 COPD, Signs/Symptoms:Fever, Syncope and Chest Pain;  Risk                 Factors:Current Smoker and Diabetes. ETOH.  Sonographer:    Ellouise Mose RDCS Referring Phys: [phone] EKTA V PATEL IMPRESSIONS  1. Left ventricular ejection fraction, by estimation, is 55 to 60%. Left ventricular ejection fraction by 2D MOD biplane is 64.6 %. Left ventricular ejection fraction by PLAX is 66 %. The left ventricle has normal function. The left ventricle has no regional wall motion abnormalities. Left ventricular diastolic parameters were normal.  2. Right ventricular systolic function is normal. The right ventricular size is normal. There is normal pulmonary artery systolic pressure. The estimated right ventricular systolic pressure is 33.8 mmHg.  3. The mitral valve is grossly normal. Trivial mitral valve regurgitation. No evidence of mitral stenosis.  4. The aortic valve is tricuspid. Aortic valve regurgitation is not visualized. No aortic stenosis is present. FINDINGS  Left Ventricle: Left ventricular ejection fraction, by estimation, is 55 to 60%. Left ventricular ejection fraction by PLAX is 66 %. Left ventricular ejection fraction by 2D MOD biplane is 64.6 %. The left ventricle has normal function. The left ventricle has no regional wall motion abnormalities. The left ventricular internal cavity size was normal in size. There is no left ventricular hypertrophy. Left ventricular diastolic parameters were normal. Right Ventricle: The right ventricular size is normal. Right ventricular systolic function is normal. There is normal pulmonary artery systolic pressure. The tricuspid regurgitant velocity is 2.54 m/s, and with an assumed right atrial pressure of 8 mmHg,  the estimated right  ventricular systolic pressure is 33.8 mmHg. Left [facility]: Left atrial size was normal in size. Right [facility]: Right atrial size was normal in size. Pericardium: There is no evidence of pericardial effusion. Mitral Valve: The mitral valve is grossly normal. Trivial mitral valve regurgitation. No evidence of mitral valve stenosis. Tricuspid Valve: The tricuspid valve is normal in structure. Tricuspid valve regurgitation is mild . No evidence of tricuspid stenosis. Aortic Valve: The aortic valve is tricuspid. Aortic valve regurgitation is not visualized. No aortic stenosis is present. Pulmonic Valve: The pulmonic valve was normal in structure. Pulmonic valve regurgitation is trivial. No evidence of pulmonic stenosis. Aorta: The aortic root and ascending aorta are structurally normal, with no evidence of dilitation. IAS/Shunts: No atrial level shunt detected by color flow Doppler.  LEFT VENTRICLE PLAX 2D                        Biplane EF (MOD) LV EF:         Left            LV Biplane EF:   Left                ventricular                      ventricular                ejection                         ejection                fraction by                      fraction by                PLAX is 66  2D MOD                %.                               biplane is LVIDd:         4.28 cm                          64.6 %. LVIDs:         2.74 cm LV PW:         1.03 cm         Diastology LV IVS:        1.04 cm         LV e' medial:    10.10 cm/s LVOT diam:     1.97 cm         LV E/e' medial:  6.7 LV SV:         69              LV e' lateral:   15.00 cm/s LV SV Index:   41              LV E/e' lateral: 4.5 LVOT Area:     3.05 cm LV IVRT:       120 msec  LV Volumes (MOD) LV vol d, MOD    85.0 ml A2C: LV vol d, MOD    85.5 ml A4C: LV vol s, MOD    29.7 ml A2C: LV vol s, MOD    31.4 ml A4C: LV SV MOD A2C:   55.3 ml LV SV MOD A4C:   85.5 ml LV SV MOD BP:    56.3 ml RIGHT VENTRICLE             IVC RV S prime:      14.70 cm/s  IVC diam: 2.30 cm TAPSE (M-mode): 2.2 cm LEFT ATRIUM             Index        RIGHT ATRIUM           Index LA diam:        2.96 cm 1.77 cm/m   RA Area:     15.20 cm LA Vol (A2C):   40.9 ml 24.47 ml/m  RA Volume:   42.70 ml  25.55 ml/m LA Vol (A4C):   38.4 ml 22.98 ml/m LA Biplane Vol: 40.3 ml 24.11 ml/m  AORTIC VALVE LVOT Vmax:   108.00 cm/s LVOT Vmean:  68.700 cm/s LVOT VTI:    0.226 m  AORTA Ao Root diam: 3.45 cm Ao Asc diam:  3.36 cm MITRAL VALVE               TRICUSPID VALVE MV Area (PHT): 3.37 cm    TR Peak grad:   25.8 mmHg MV Decel Time: 225 msec    TR Vmax:        254.00 cm/s MV E velocity: 67.60 cm/s MV A velocity: 42.40 cm/s  SHUNTS MV E/A ratio:  1.59        Systemic VTI:  0.23 m                            Systemic Diam: 1.97 cm Redell Shallow MD Electronically signed by Redell Shallow MD Signature Date/Time: 12/17/2023/3:28:27 PM    Final  US  Abdomen Limited RUQ (LIVER/GB) Result Date: 12/16/2023 CLINICAL DATA:  151471 RUQ pain 151471 EXAM: ULTRASOUND ABDOMEN LIMITED RIGHT UPPER QUADRANT COMPARISON:  12/15/2023 FINDINGS: Gallbladder: Layering biliary sludge. No radiopaque stones. No wall thickening or pericholecystic fluid. No sonographic Murphy's sign noted by sonographer. Common bile duct: Diameter: 5 mm, upstream. The metallic biliary duct stent on the prior CT is not well visualized or evaluated. Liver: Normal echogenicity. No focal lesion identified. Similar diffuse intrahepatic biliary ductal dilation. Portal vein is patent on color Doppler imaging with normal direction of blood flow towards the liver. Other: None. IMPRESSION: Small volume layering biliary sludge. No cholecystolithiasis or changes of acute cholecystitis. Electronically Signed   By: Rogelia Myers M.D.   On: 12/16/2023 15:24   CT ABDOMEN PELVIS W CONTRAST Result Date: 12/15/2023 CLINICAL DATA:  Sepsis.  History of cirrhosis EXAM: CT ABDOMEN AND PELVIS WITH CONTRAST TECHNIQUE: Multidetector CT imaging of  the abdomen and pelvis was performed using the standard protocol following bolus administration of intravenous contrast. RADIATION DOSE REDUCTION: This exam was performed according to the departmental dose-optimization program which includes automated exposure control, adjustment of the mA and/or kV according to patient size and/or use of iterative reconstruction technique. CONTRAST:  75mL OMNIPAQUE  IOHEXOL  350 MG/ML SOLN COMPARISON:  Multiple prior imaging studies. The most recent CT scan is 06/17/2023 and the most recent MRI is 09/18/2023 FINDINGS: Lower chest: Streaky subpleural atelectasis. No pleural effusions or pericardial effusion. No pulmonary infiltrates or pulmonary nodules. Hepatobiliary: Metallic common bile duct stent is in place and new since the prior CT scan. Associated not unexpected pneumobilia. No worrisome hepatic lesions. Stable cirrhotic changes. Again noted is cavernous transformation of the portal vein. Stable portal venous hypertension with portal venous collaterals and esophageal varices. Pancreas: Stable changes of chronic calcific pancreatitis with marked pancreatic atrophy. No acute inflammation. Spleen: Stable massive splenomegaly. 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 Adrenals/Urinary Tract: The adrenal glands and kidneys are unremarkable and stable. Simple bilateral renal cysts. No worrisome renal lesions or hydronephrosis. The bladder is unremarkable. Stomach/Bowel: The stomach, duodenum, small bowel and colon are grossly normal without oral contrast. No inflammatory changes, mass lesions or obstructive findings. Vascular/Lymphatic: Stable atherosclerotic calcifications involving the aorta and iliac arteries. No aneurysm dissection. The major venous structures are patent except for chronic portal vein thrombosis. Reproductive: The prostate gland and seminal vesicles are unremarkable. Other: Mild chronic mesenteric edema. Small amount of free pelvic fluid  noted no overt ascites. Musculoskeletal: No significant bony findings. IMPRESSION: 1. Stable cirrhotic changes and portal venous hypertension with portal venous collaterals and esophageal varices. 2. 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 3. Metallic common bile duct stent in place with not unexpected pneumobilia. 4. Stable changes of chronic calcific pancreatitis with marked pancreatic atrophy. No acute inflammation. Aortic Atherosclerosis (ICD10-I70.0). Electronically Signed   By: MYRTIS Stammer M.D.   On: 12/15/2023 16:31   DG Chest Port 1 View Result Date: 12/15/2023 EXAM: 1 VIEW XRAY OF THE CHEST 12/15/2023 01:02:00 PM COMPARISON: 06/16/2023 CLINICAL HISTORY: Questionable sepsis - evaluate for abnormality. Questionable sepsis; hx of pancreatitis, hypertension. FINDINGS: LINES, TUBES AND DEVICES: EKG leads noted. LUNGS AND PLEURA: No focal pulmonary opacity. No pulmonary edema. No pleural effusion. No pneumothorax. Skin fold over the upper right hemithorax. HEART AND MEDIASTINUM: No acute abnormality of the cardiac and mediastinal silhouettes. Mild left hemidiaphragm elevation. BONES AND SOFT TISSUES: No acute osseous abnormality. IMPRESSION: 1. No acute cardiopulmonary abnormality. Electronically signed by: Rockey  Marthann MD 12/15/2023 01:36 PM EDT RP Workstation: [id number]     The results of significant diagnostics from this hospitalization (including imaging, microbiology, ancillary and laboratory) are listed below for reference.     Microbiology: Recent Results (from the past 240 hours)  Resp panel by RT-PCR (RSV, Flu A&B, Covid) Anterior Nasal Swab     Status: None   Collection Time: 12/15/23 12:37 PM   Specimen: Anterior Nasal Swab  Result Value Ref Range Status   SARS Coronavirus 2 by RT PCR NEGATIVE NEGATIVE Final   Influenza A by PCR NEGATIVE NEGATIVE Final   Influenza B by PCR NEGATIVE NEGATIVE Final    Comment: (NOTE) The Xpert Xpress  SARS-CoV-2/FLU/RSV plus assay is intended as an aid in the diagnosis of influenza from Nasopharyngeal swab specimens and should not be used as a sole basis for treatment. Nasal washings and aspirates are unacceptable for Xpert Xpress SARS-CoV-2/FLU/RSV testing.  Fact Sheet for Patients: BloggerCourse.com  Fact Sheet for Healthcare Providers: SeriousBroker.it  This test is not yet approved or cleared by the [place]  FDA and has been authorized for detection and/or diagnosis of SARS-CoV-2 by FDA under an Emergency Use Authorization (EUA). This EUA will remain in effect (meaning this test can be used) for the duration of the COVID-19 declaration under Section 564(b)(1) of the Act, 21 U.S.C. section 360bbb-3(b)(1), unless the authorization is terminated or revoked.     Resp Syncytial Virus by PCR NEGATIVE NEGATIVE Final    Comment: (NOTE) Fact Sheet for Patients: BloggerCourse.com  Fact Sheet for Healthcare Providers: SeriousBroker.it  This test is not yet approved or cleared by the [place]  FDA and has been authorized for detection and/or diagnosis of SARS-CoV-2 by FDA under an Emergency Use Authorization (EUA). This EUA will remain in effect (meaning this test can be used) for the duration of the COVID-19 declaration under Section 564(b)(1) of the Act, 21 U.S.C. section 360bbb-3(b)(1), unless the authorization is terminated or revoked.  Performed at [facility], [address], [place], [place] [address]   Blood culture (routine x 2)     Status: None (Preliminary result)   Collection Time: 12/15/23 12:45 PM   Specimen: BLOOD LEFT FOREARM  Result Value Ref Range Status   Specimen Description BLOOD LEFT FOREARM  Final   Special Requests   Final    BOTTLES DRAWN AEROBIC AND ANAEROBIC Blood Culture adequate volume   Culture   Final    NO GROWTH 3 DAYS Performed  at [facility], [address], [place], [place] [address]    Report Status PENDING  Incomplete  Blood culture (routine x 2)     Status: None (Preliminary result)   Collection Time: 12/15/23  1:08 PM   Specimen: BLOOD  Result Value Ref Range Status   Specimen Description BLOOD SITE NOT SPECIFIED  Final   Special Requests   Final    BOTTLES DRAWN AEROBIC AND ANAEROBIC Blood Culture adequate volume   Culture   Final    NO GROWTH 3 DAYS Performed at [facility], [address] [place], [place] [address]    Report Status PENDING  Incomplete  Culture, blood (Routine X 2) w Reflex to ID Panel     Status: None (Preliminary result)   Collection Time: 12/17/23 10:05 AM   Specimen: BLOOD LEFT HAND  Result Value Ref Range Status   Specimen Description BLOOD LEFT HAND  Final   Special Requests   Final    BOTTLES  DRAWN AEROBIC AND ANAEROBIC Blood Culture results may not be optimal due to an inadequate volume of blood received in culture bottles   Culture   Final    NO GROWTH < 24 HOURS Performed at [facility], [address] [place], [place] [address]    Report Status PENDING  Incomplete  Culture, blood (Routine X 2) w Reflex to ID Panel     Status: None (Preliminary result)   Collection Time: 12/17/23 10:06 AM   Specimen: BLOOD  Result Value Ref Range Status   Specimen Description BLOOD RIGHT ANTECUBITAL  Final   Special Requests   Final    BOTTLES DRAWN AEROBIC ONLY Blood Culture results may not be optimal due to an inadequate volume of blood received in culture bottles   Culture   Final    NO GROWTH < 24 HOURS Performed at [facility], [address] [place], [place] [address]    Report Status PENDING  Incomplete     Labs: BNP (last 3 results) No results for input(s): BNP in the last 8760 hours. Basic Metabolic Panel: Recent Labs  Lab 12/15/23 1236 12/15/23 1256 12/16/23 1002 12/16/23 1731 12/17/23 0406 12/18/23 0429  NA 132* 135 131*  --   134* 136  K 3.9 4.0 3.8  --  3.7 3.9  CL 103 103 100  --  102 105  CO2 18*  --  21*  --  25 21*  GLUCOSE 99 103* 309*  --  145* 148*  BUN 14 14 12   --  10 11  CREATININE 0.99 0.90 1.05  --  1.01 0.82  CALCIUM 8.2*  --  8.3*  --  7.9* 8.0*  MG  --   --   --  1.8 1.9 1.8  PHOS  --   --   --   --   --  3.2   Liver Function Tests: Recent Labs  Lab 12/15/23 1236 12/16/23 1002 12/17/23 0406 12/18/23 0429  AST 31 26 19 21   ALT 43 36 31 27  ALKPHOS 475* 488* 458* 546*  BILITOT 2.7* 1.7* 1.5* 1.3*  PROT 6.6 6.8 6.1* 6.3*  ALBUMIN 2.5* 2.4* 2.2* 2.1*   Recent Labs  Lab 12/15/23 1236 12/16/23 1731  LIPASE <10* <10*   No results for input(s): AMMONIA in the last 168 hours. CBC: Recent Labs  Lab 12/15/23 1236 12/15/23 1256 12/16/23 1002 12/17/23 0406 12/18/23 0429  WBC 6.0  --  5.4 3.6* 3.0*  NEUTROABS 5.4  --  4.3  --   --   HGB 10.4* 10.5* 11.0* 10.3* 10.5*  HCT 32.2* 31.0* 34.9* 31.6* 32.6*  MCV 89.0  --  90.2 87.5 87.4  PLT 38*  --  40* 34* 34*   Cardiac Enzymes: No results for input(s): CKTOTAL, CKMB, CKMBINDEX, TROPONINI in the last 168 hours. BNP: Invalid input(s): POCBNP CBG: Recent Labs  Lab 12/17/23 0805 12/17/23 1129 12/17/23 1624 12/17/23 1941 12/18/23 0727  GLUCAP 209* 151* 176* 220* 133*   D-Dimer No results for input(s): DDIMER in the last 72 hours. Hgb A1c Recent Labs    12/16/23 1731  HGBA1C 7.3*   Lipid Profile No results for input(s): CHOL, HDL, LDLCALC, TRIG, CHOLHDL, LDLDIRECT in the last 72 hours. Thyroid function studies No results for input(s): TSH, T4TOTAL, T3FREE, THYROIDAB in the last 72 hours.  Invalid input(s): FREET3 Anemia work up No results for input(s): VITAMINB12, FOLATE, FERRITIN, TIBC, IRON, RETICCTPCT in the last 72 hours. Urinalysis  Component Value Date/Time   COLORURINE YELLOW 12/16/2023 1034   APPEARANCEUR CLEAR 12/16/2023 1034   LABSPEC 1.031 (H) 12/16/2023  1034   PHURINE 5.0 12/16/2023 1034   GLUCOSEU >=500 (A) 12/16/2023 1034   HGBUR NEGATIVE 12/16/2023 1034   BILIRUBINUR NEGATIVE 12/16/2023 1034   KETONESUR NEGATIVE 12/16/2023 1034   PROTEINUR NEGATIVE 12/16/2023 1034   UROBILINOGEN 0.2 02/25/2014 1320   NITRITE NEGATIVE 12/16/2023 1034   LEUKOCYTESUR NEGATIVE 12/16/2023 1034   Sepsis Labs Recent Labs  Lab 12/15/23 1236 12/16/23 1002 12/17/23 0406 12/18/23 0429  WBC 6.0 5.4 3.6* 3.0*   Microbiology Recent Results (from the past 240 hours)  Resp panel by RT-PCR (RSV, Flu A&B, Covid) Anterior Nasal Swab     Status: None   Collection Time: 12/15/23 12:37 PM   Specimen: Anterior Nasal Swab  Result Value Ref Range Status   SARS Coronavirus 2 by RT PCR NEGATIVE NEGATIVE Final   Influenza A by PCR NEGATIVE NEGATIVE Final   Influenza B by PCR NEGATIVE NEGATIVE Final    Comment: (NOTE) The Xpert Xpress SARS-CoV-2/FLU/RSV plus assay is intended as an aid in the diagnosis of influenza from Nasopharyngeal swab specimens and should not be used as a sole basis for treatment. Nasal washings and aspirates are unacceptable for Xpert Xpress SARS-CoV-2/FLU/RSV testing.  Fact Sheet for Patients: BloggerCourse.com  Fact Sheet for Healthcare Providers: SeriousBroker.it  This test is not yet approved or cleared by the [place]  FDA and has been authorized for detection and/or diagnosis of SARS-CoV-2 by FDA under an Emergency Use Authorization (EUA). This EUA will remain in effect (meaning this test can be used) for the duration of the COVID-19 declaration under Section 564(b)(1) of the Act, 21 U.S.C. section 360bbb-3(b)(1), unless the authorization is terminated or revoked.     Resp Syncytial Virus by PCR NEGATIVE NEGATIVE Final    Comment: (NOTE) Fact Sheet for Patients: BloggerCourse.com  Fact Sheet for Healthcare  Providers: SeriousBroker.it  This test is not yet approved or cleared by the [place]  FDA and has been authorized for detection and/or diagnosis of SARS-CoV-2 by FDA under an Emergency Use Authorization (EUA). This EUA will remain in effect (meaning this test can be used) for the duration of the COVID-19 declaration under Section 564(b)(1) of the Act, 21 U.S.C. section 360bbb-3(b)(1), unless the authorization is terminated or revoked.  Performed at [facility], [address], [place], [place] [address]   Blood culture (routine x 2)     Status: None (Preliminary result)   Collection Time: 12/15/23 12:45 PM   Specimen: BLOOD LEFT FOREARM  Result Value Ref Range Status   Specimen Description BLOOD LEFT FOREARM  Final   Special Requests   Final    BOTTLES DRAWN AEROBIC AND ANAEROBIC Blood Culture adequate volume   Culture   Final    NO GROWTH 3 DAYS Performed at [facility], [address], [place], [place] [address]    Report Status PENDING  Incomplete  Blood culture (routine x 2)     Status: None (Preliminary result)   Collection Time: 12/15/23  1:08 PM   Specimen: BLOOD  Result Value Ref Range Status   Specimen Description BLOOD SITE NOT SPECIFIED  Final   Special Requests   Final    BOTTLES DRAWN AEROBIC AND ANAEROBIC Blood Culture adequate volume   Culture   Final    NO GROWTH 3 DAYS Performed at [facility], [address], [place], [place] [address]  Report Status PENDING  Incomplete  Culture, blood (Routine X 2) w Reflex to ID Panel     Status: None (Preliminary result)   Collection Time: 12/17/23 10:05 AM   Specimen: BLOOD LEFT HAND  Result Value Ref Range Status   Specimen Description BLOOD LEFT HAND  Final   Special Requests   Final    BOTTLES DRAWN AEROBIC AND ANAEROBIC Blood Culture results may not be optimal due to an inadequate volume of blood received in culture bottles   Culture   Final    NO GROWTH < 24  HOURS Performed at [facility], [address] [place], [place] [address]    Report Status PENDING  Incomplete  Culture, blood (Routine X 2) w Reflex to ID Panel     Status: None (Preliminary result)   Collection Time: 12/17/23 10:06 AM   Specimen: BLOOD  Result Value Ref Range Status   Specimen Description BLOOD RIGHT ANTECUBITAL  Final   Special Requests   Final    BOTTLES DRAWN AEROBIC ONLY Blood Culture results may not be optimal due to an inadequate volume of blood received in culture bottles   Culture   Final    NO GROWTH < 24 HOURS Performed at [facility], [address] [place], [place] [address]    Report Status PENDING  Incomplete     Time coordinating discharge:  I have spent 35 minutes face to face with the patient and on the ward discussing the patients care, assessment, plan and disposition with other care givers. >50% of the time was devoted counseling the patient about the risks and benefits of treatment/Discharge disposition and coordinating care.   SIGNED:   Burgess JAYSON Dare, MD  [facility] 12/18/2023, 12:18 PM   If 7PM-7AM, please contact night-coverage

## 2023-12-20 LAB — CULTURE, BLOOD (ROUTINE X 2)
Culture: NO GROWTH
Culture: NO GROWTH
Special Requests: ADEQUATE
Special Requests: ADEQUATE

## 2023-12-22 LAB — CULTURE, BLOOD (ROUTINE X 2)
Culture: NO GROWTH
Culture: NO GROWTH
